# Patient Record
Sex: Female | Born: 1960 | Race: White | Hispanic: No | Marital: Married | Smoking: Never smoker
Health system: Southern US, Community
[De-identification: ages and names within clinical notes are randomized; demographics above are authoritative.]

## PROBLEM LIST (undated history)

## (undated) DIAGNOSIS — I493 Ventricular premature depolarization: Secondary | ICD-10-CM

## (undated) DIAGNOSIS — Z9289 Personal history of other medical treatment: Secondary | ICD-10-CM

## (undated) DIAGNOSIS — Z8489 Family history of other specified conditions: Secondary | ICD-10-CM

## (undated) DIAGNOSIS — IMO0001 Reserved for inherently not codable concepts without codable children: Secondary | ICD-10-CM

## (undated) DIAGNOSIS — J4540 Moderate persistent asthma, uncomplicated: Secondary | ICD-10-CM

## (undated) DIAGNOSIS — I272 Pulmonary hypertension, unspecified: Secondary | ICD-10-CM

## (undated) DIAGNOSIS — Z8719 Personal history of other diseases of the digestive system: Secondary | ICD-10-CM

## (undated) DIAGNOSIS — K449 Diaphragmatic hernia without obstruction or gangrene: Secondary | ICD-10-CM

## (undated) DIAGNOSIS — R1011 Right upper quadrant pain: Secondary | ICD-10-CM

## (undated) DIAGNOSIS — R232 Flushing: Secondary | ICD-10-CM

## (undated) DIAGNOSIS — K219 Gastro-esophageal reflux disease without esophagitis: Secondary | ICD-10-CM

## (undated) DIAGNOSIS — I471 Supraventricular tachycardia: Secondary | ICD-10-CM

## (undated) DIAGNOSIS — N6019 Diffuse cystic mastopathy of unspecified breast: Secondary | ICD-10-CM

## (undated) DIAGNOSIS — Z973 Presence of spectacles and contact lenses: Secondary | ICD-10-CM

## (undated) DIAGNOSIS — R002 Palpitations: Secondary | ICD-10-CM

## (undated) DIAGNOSIS — E785 Hyperlipidemia, unspecified: Secondary | ICD-10-CM

## (undated) DIAGNOSIS — N3090 Cystitis, unspecified without hematuria: Secondary | ICD-10-CM

## (undated) DIAGNOSIS — I5032 Chronic diastolic (congestive) heart failure: Secondary | ICD-10-CM

## (undated) DIAGNOSIS — I491 Atrial premature depolarization: Secondary | ICD-10-CM

## (undated) DIAGNOSIS — I4719 Other supraventricular tachycardia: Secondary | ICD-10-CM

## (undated) HISTORY — PX: BREAST SURGERY: SHX581

## (undated) HISTORY — DX: Diffuse cystic mastopathy of unspecified breast: N60.19

## (undated) HISTORY — DX: Diaphragmatic hernia without obstruction or gangrene: K44.9

## (undated) HISTORY — DX: Personal history of other medical treatment: Z92.89

## (undated) HISTORY — PX: UPPER GASTROINTESTINAL ENDOSCOPY: SHX188

## (undated) HISTORY — DX: Right upper quadrant pain: R10.11

## (undated) HISTORY — DX: Gastro-esophageal reflux disease without esophagitis: K21.9

## (undated) HISTORY — DX: Moderate persistent asthma, uncomplicated: J45.40

## (undated) HISTORY — DX: Palpitations: R00.2

## (undated) HISTORY — DX: Atrial premature depolarization: I49.1

## (undated) HISTORY — PX: DILATION AND CURETTAGE OF UTERUS: SHX78

## (undated) HISTORY — DX: Cystitis, unspecified without hematuria: N30.90

## (undated) HISTORY — DX: Flushing: R23.2

## (undated) HISTORY — DX: Chronic diastolic (congestive) heart failure: I50.32

## (undated) HISTORY — DX: Supraventricular tachycardia: I47.1

## (undated) HISTORY — DX: Reserved for inherently not codable concepts without codable children: IMO0001

## (undated) HISTORY — DX: Pulmonary hypertension, unspecified: I27.20

## (undated) HISTORY — DX: Ventricular premature depolarization: I49.3

## (undated) HISTORY — DX: Other supraventricular tachycardia: I47.19

## (undated) HISTORY — DX: Hyperlipidemia, unspecified: E78.5

---
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
## 1994-02-06 HISTORY — PX: ABDOMINAL HYSTERECTOMY: SHX81

## 1999-01-10 ENCOUNTER — Encounter: Payer: Self-pay | Admitting: Family Medicine

## 1999-01-10 ENCOUNTER — Encounter: Admission: RE | Admit: 1999-01-10 | Discharge: 1999-01-10 | Payer: Self-pay | Admitting: Family Medicine

## 1999-08-25 ENCOUNTER — Encounter: Admission: RE | Admit: 1999-08-25 | Discharge: 1999-08-25 | Payer: Self-pay | Admitting: Family Medicine

## 1999-08-25 ENCOUNTER — Encounter: Payer: Self-pay | Admitting: Family Medicine

## 2002-04-09 ENCOUNTER — Encounter: Payer: Self-pay | Admitting: Emergency Medicine

## 2002-04-09 ENCOUNTER — Emergency Department (HOSPITAL_COMMUNITY): Admission: EM | Admit: 2002-04-09 | Discharge: 2002-04-09 | Payer: Self-pay | Admitting: Emergency Medicine

## 2003-06-16 ENCOUNTER — Encounter: Admission: RE | Admit: 2003-06-16 | Discharge: 2003-06-16 | Payer: Self-pay | Admitting: Internal Medicine

## 2004-05-25 ENCOUNTER — Emergency Department (HOSPITAL_COMMUNITY): Admission: EM | Admit: 2004-05-25 | Discharge: 2004-05-25 | Payer: Self-pay | Admitting: *Deleted

## 2005-10-05 ENCOUNTER — Ambulatory Visit: Payer: Self-pay | Admitting: Family Medicine

## 2005-10-13 ENCOUNTER — Ambulatory Visit: Payer: Self-pay | Admitting: Family Medicine

## 2005-11-22 ENCOUNTER — Ambulatory Visit: Payer: Self-pay | Admitting: Family Medicine

## 2005-11-22 LAB — CONVERTED CEMR LAB
Glucose, Bld: 110 mg/dL — ABNORMAL HIGH (ref 70–99)
Hgb A1c MFr Bld: 5.4 % (ref 4.6–6.0)

## 2006-01-03 ENCOUNTER — Encounter: Admission: RE | Admit: 2006-01-03 | Discharge: 2006-04-03 | Payer: Self-pay | Admitting: Family Medicine

## 2006-07-12 DIAGNOSIS — J45901 Unspecified asthma with (acute) exacerbation: Secondary | ICD-10-CM | POA: Insufficient documentation

## 2006-07-12 DIAGNOSIS — K219 Gastro-esophageal reflux disease without esophagitis: Secondary | ICD-10-CM | POA: Insufficient documentation

## 2006-10-08 LAB — CONVERTED CEMR LAB: Pap Smear: NORMAL

## 2007-01-18 ENCOUNTER — Telehealth: Payer: Self-pay | Admitting: Internal Medicine

## 2007-03-07 ENCOUNTER — Ambulatory Visit: Payer: Self-pay | Admitting: Internal Medicine

## 2007-03-07 DIAGNOSIS — E119 Type 2 diabetes mellitus without complications: Secondary | ICD-10-CM

## 2007-03-07 DIAGNOSIS — N3090 Cystitis, unspecified without hematuria: Secondary | ICD-10-CM | POA: Insufficient documentation

## 2007-03-07 DIAGNOSIS — E785 Hyperlipidemia, unspecified: Secondary | ICD-10-CM | POA: Insufficient documentation

## 2007-03-07 DIAGNOSIS — I491 Atrial premature depolarization: Secondary | ICD-10-CM | POA: Insufficient documentation

## 2007-03-07 DIAGNOSIS — E118 Type 2 diabetes mellitus with unspecified complications: Secondary | ICD-10-CM | POA: Insufficient documentation

## 2007-03-07 DIAGNOSIS — E782 Mixed hyperlipidemia: Secondary | ICD-10-CM | POA: Insufficient documentation

## 2007-03-07 LAB — CONVERTED CEMR LAB
Bacteria, UA: NONE SEEN
Bilirubin Urine: NEGATIVE
Glucose, Urine, Semiquant: NEGATIVE
Ketones, urine, test strip: NEGATIVE
Nitrite: NEGATIVE
RBC / HPF: NONE SEEN (ref <3)
WBC, UA: NONE SEEN cells/hpf (ref <3)
pH: 6

## 2007-03-08 ENCOUNTER — Encounter: Payer: Self-pay | Admitting: Internal Medicine

## 2007-03-12 LAB — CONVERTED CEMR LAB
AST: 21 units/L (ref 0–37)
Bilirubin, Direct: 0.1 mg/dL (ref 0.0–0.3)
Chloride: 103 meq/L (ref 96–112)
Cholesterol: 138 mg/dL (ref 0–200)
Creatinine, Ser: 0.6 mg/dL (ref 0.4–1.2)
Eosinophils Absolute: 0.1 10*3/uL (ref 0.0–0.6)
Eosinophils Relative: 1.6 % (ref 0.0–5.0)
GFR calc non Af Amer: 113 mL/min
Glucose, Bld: 118 mg/dL — ABNORMAL HIGH (ref 70–99)
HCT: 40.6 % (ref 36.0–46.0)
Hemoglobin: 13.4 g/dL (ref 12.0–15.0)
Hgb A1c MFr Bld: 6.2 % — ABNORMAL HIGH (ref 4.6–6.0)
MCHC: 32.9 g/dL (ref 30.0–36.0)
MCV: 86.9 fL (ref 78.0–100.0)
Monocytes Absolute: 0.4 10*3/uL (ref 0.2–0.7)
Neutrophils Relative %: 69 % (ref 43.0–77.0)
Potassium: 4.8 meq/L (ref 3.5–5.1)
RBC: 4.67 M/uL (ref 3.87–5.11)
Sodium: 141 meq/L (ref 135–145)
TSH: 1.52 microintl units/mL (ref 0.35–5.50)
Total Bilirubin: 0.6 mg/dL (ref 0.3–1.2)
Total CHOL/HDL Ratio: 2.7
WBC: 6.4 10*3/uL (ref 4.5–10.5)

## 2007-04-03 ENCOUNTER — Encounter: Payer: Self-pay | Admitting: Internal Medicine

## 2007-08-30 ENCOUNTER — Ambulatory Visit: Payer: Self-pay | Admitting: Internal Medicine

## 2007-09-09 LAB — CONVERTED CEMR LAB: ALT: 19 units/L (ref 0–35)

## 2007-09-10 ENCOUNTER — Encounter (INDEPENDENT_AMBULATORY_CARE_PROVIDER_SITE_OTHER): Payer: Self-pay | Admitting: *Deleted

## 2007-09-18 ENCOUNTER — Telehealth: Payer: Self-pay | Admitting: Internal Medicine

## 2007-09-20 ENCOUNTER — Telehealth (INDEPENDENT_AMBULATORY_CARE_PROVIDER_SITE_OTHER): Payer: Self-pay | Admitting: *Deleted

## 2007-09-20 ENCOUNTER — Ambulatory Visit: Payer: Self-pay | Admitting: *Deleted

## 2007-11-11 ENCOUNTER — Encounter: Payer: Self-pay | Admitting: Internal Medicine

## 2008-02-26 ENCOUNTER — Ambulatory Visit: Payer: Self-pay | Admitting: Internal Medicine

## 2008-06-18 ENCOUNTER — Encounter: Payer: Self-pay | Admitting: Internal Medicine

## 2008-08-03 ENCOUNTER — Encounter (INDEPENDENT_AMBULATORY_CARE_PROVIDER_SITE_OTHER): Payer: Self-pay | Admitting: *Deleted

## 2008-09-05 ENCOUNTER — Encounter: Payer: Self-pay | Admitting: Internal Medicine

## 2008-09-05 ENCOUNTER — Emergency Department (HOSPITAL_COMMUNITY): Admission: EM | Admit: 2008-09-05 | Discharge: 2008-09-05 | Payer: Self-pay | Admitting: Emergency Medicine

## 2008-10-02 ENCOUNTER — Ambulatory Visit: Payer: Self-pay | Admitting: Internal Medicine

## 2008-10-07 ENCOUNTER — Ambulatory Visit: Payer: Self-pay | Admitting: Internal Medicine

## 2008-10-08 ENCOUNTER — Telehealth (INDEPENDENT_AMBULATORY_CARE_PROVIDER_SITE_OTHER): Payer: Self-pay | Admitting: *Deleted

## 2008-10-09 ENCOUNTER — Telehealth: Payer: Self-pay | Admitting: Internal Medicine

## 2008-10-15 LAB — CONVERTED CEMR LAB
ALT: 42 units/L — ABNORMAL HIGH (ref 0–35)
Albumin: 3.9 g/dL (ref 3.5–5.2)
Basophils Relative: 0.9 % (ref 0.0–3.0)
Chloride: 104 meq/L (ref 96–112)
Cholesterol: 178 mg/dL (ref 0–200)
Eosinophils Absolute: 0.1 10*3/uL (ref 0.0–0.7)
Eosinophils Relative: 1.9 % (ref 0.0–5.0)
GFR calc non Af Amer: 94.14 mL/min (ref >60)
Glucose, Bld: 194 mg/dL — ABNORMAL HIGH (ref 70–99)
HDL: 49.8 mg/dL (ref >39.00)
Lymphocytes Relative: 26.9 % (ref 12.0–46.0)
MCHC: 33.9 g/dL (ref 30.0–36.0)
Monocytes Relative: 7.3 % (ref 3.0–12.0)
Neutrophils Relative %: 63 % (ref 43.0–77.0)
Potassium: 3.8 meq/L (ref 3.5–5.1)
RBC: 4.64 M/uL (ref 3.87–5.11)
Sodium: 139 meq/L (ref 135–145)
Total Protein: 6.9 g/dL (ref 6.0–8.3)
VLDL: 15.6 mg/dL (ref 0.0–40.0)
WBC: 5.1 10*3/uL (ref 4.5–10.5)

## 2009-01-04 ENCOUNTER — Encounter (INDEPENDENT_AMBULATORY_CARE_PROVIDER_SITE_OTHER): Payer: Self-pay | Admitting: *Deleted

## 2009-01-04 ENCOUNTER — Ambulatory Visit: Payer: Self-pay | Admitting: Internal Medicine

## 2009-02-06 HISTORY — PX: CATARACT EXTRACTION: SUR2

## 2009-02-26 ENCOUNTER — Encounter: Payer: Self-pay | Admitting: Internal Medicine

## 2009-03-17 ENCOUNTER — Ambulatory Visit: Payer: Self-pay | Admitting: Internal Medicine

## 2009-03-21 LAB — CONVERTED CEMR LAB
ALT: 25 units/L (ref 0–35)
AST: 23 units/L (ref 0–37)
HDL: 44.1 mg/dL (ref >39.00)

## 2009-05-24 ENCOUNTER — Encounter: Payer: Self-pay | Admitting: Internal Medicine

## 2009-07-02 ENCOUNTER — Ambulatory Visit: Payer: Self-pay | Admitting: Internal Medicine

## 2009-07-02 LAB — CONVERTED CEMR LAB
Bilirubin Urine: NEGATIVE
Casts: NONE SEEN /lpf
Crystals: NONE SEEN
Glucose, Urine, Semiquant: NEGATIVE
Nitrite: NEGATIVE
Protein, U semiquant: NEGATIVE
Specific Gravity, Urine: 1.012 (ref 1.005–1.030)
Squamous Epithelial / LPF: NONE SEEN /lpf
pH: 5.5 (ref 5.0–8.0)

## 2009-07-03 ENCOUNTER — Encounter: Payer: Self-pay | Admitting: Internal Medicine

## 2009-07-23 ENCOUNTER — Ambulatory Visit: Payer: Self-pay | Admitting: Internal Medicine

## 2009-08-26 ENCOUNTER — Encounter: Payer: Self-pay | Admitting: Internal Medicine

## 2009-08-26 LAB — CONVERTED CEMR LAB
Calcium: 9.4 mg/dL
HDL: 61 mg/dL
Hgb A1c MFr Bld: 5.8 %
Triglycerides: 69 mg/dL

## 2009-09-15 ENCOUNTER — Telehealth (INDEPENDENT_AMBULATORY_CARE_PROVIDER_SITE_OTHER): Payer: Self-pay | Admitting: *Deleted

## 2009-09-15 ENCOUNTER — Encounter: Payer: Self-pay | Admitting: Internal Medicine

## 2009-11-12 ENCOUNTER — Telehealth: Payer: Self-pay | Admitting: Internal Medicine

## 2009-12-14 ENCOUNTER — Ambulatory Visit: Payer: Self-pay | Admitting: Internal Medicine

## 2010-02-27 ENCOUNTER — Encounter: Payer: Self-pay | Admitting: Internal Medicine

## 2010-03-08 NOTE — Progress Notes (Signed)
Summary: [facility] lost rx  Phone Note From Other Clinic   Summary of Call: [facility] called stating that they lost patient prescriptions for Crestor and Metformin. RX's were given to them verbally. Initial call taken by: Lucious Groves CMA,  November 12, 2009 10:56 AM  Follow-up for Phone Call        okay to re-do  prescriptions Follow-up by: [facility] E. Paz MD,  November 12, 2009 11:47 AM    Prescriptions: CRESTOR 10 MG TABS (ROSUVASTATIN CALCIUM) as directed  #90 x 3   Entered by:   Lucious Groves CMA   Authorized by:   Nolon Rod. Paz MD   Signed by:   Lucious Groves CMA on 11/12/2009   Method used:   Telephoned to ...       [facility] [address] [id number]* (retail)       [address]       [place], [place]  [address]       Ph: [phone]       Fax: [phone]   RxID:   [id number] METFORMIN HCL 500 MG  TABS (METFORMIN HCL) two times a day  #180 x 3   Entered by:   Lucious Groves CMA   Authorized by:   Nolon Rod. Paz MD   Signed by:   Lucious Groves CMA on 11/12/2009   Method used:   Telephoned to ...       [facility] [id number]* (retail)       [address]       [place], [place]  [address]       Ph: [phone]       Fax: [phone]   RxID:   [id number]

## 2010-03-08 NOTE — Assessment & Plan Note (Signed)
Summary: 4 mo. f/u - jr- pt will arrive at 2:00   Vital Signs:  Patient profile:   50 year old female Height:      64.5 inches Weight:      189 pounds Temp:     98.5 degrees F oral Pulse rate:   60 / minute BP sitting:   100 / 72  (left arm)  Vitals Entered By: Jeremy Johann CMA (July 23, 2009 2:24 PM) CC: 4 month f/u Comments --not fasting --DM __refills REVIEWED MED LIST, PATIENT AGREED DOSE AND INSTRUCTION CORRECT    History of Present Illness: routine office visit Was recently seen with a UTI, completely asymptomatic at this point  Would like to continue using Macrobid postcoital for prevention of UTIs. Needs refill on her medications  Allergies: 1)  ! Pcn 2)  ! Stadol (Butorphanol Tartrate)  Past History:  Past Medical History: Reviewed history from 07/02/2009 and no changes required. Asthma-- sees Dr Beaulah Dinning  GERD Fibrocystic Breast disease Recurrent cystitis Hyperlipidemia  DM--A1c 6.2 (1/ 2009) PACs sees Dr Deborah Chalk recurring UTIs, took  postcoital antibiotic for years, symptoms better after her hysterectomy 1996, symptoms re-surface in 2011  Past Surgical History: Reviewed history from 07/02/2009 and no changes required. Hysterectomy due to endometriosis and fibroids approximately 1996 NO oophorectomy Cataract extraction (06/07/2009) left Cataract extraction 06/28/09  right  Social History: Reviewed history from 03/17/2009 and no changes required. 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  Physical Exam  General:  alert and well-developed.   Lungs:  normal respiratory effort, no intercostal retractions, no accessory muscle use, and normal breath sounds.     Heart:  normal rate, regular rhythm, and no murmur.   Extremities:  no pretibial edema bilaterally  Psych:  Oriented X3, memory intact for recent and remote, normally interactive, good eye contact, not anxious appearing, and not depressed appearing.     Impression & Recommendations:  Problem # 1:  UTI (ICD-599.0) asymptomatic Macrobid refill provided, she will use one Macrobid postcoital for UTI prevention Her updated medication list for this problem includes:    Macrobid 100 Mg Caps (Nitrofurantoin monohyd macro) .Marland Kitchen... As directed  Problem # 2:  DIABETES MELLITUS, TYPE II, BORDERLINE (ICD-790.29) needs refill and labs, see instructions Her updated medication list for this problem includes:    Metformin Hcl 500 Mg Tabs (Metformin hcl) .Marland Kitchen..Marland Kitchen Two times a day  Labs Reviewed: Creat: 0.7 (10/07/2008)     Problem # 3:  HYPERLIPIDEMIA (ICD-272.4) on a lower dose of Crestor, taking only 5 mg daily. Due for labs, see instructions Her updated medication list for this problem includes:    Crestor 10 Mg Tabs (Rosuvastatin calcium) .Marland Kitchen... As directed  Labs Reviewed: SGOT: 23 (03/17/2009)   SGPT: 25 (03/17/2009)   HDL:44.10 (03/17/2009), 49.80 (10/07/2008)  LDL:56 (03/17/2009), 113 (16/11/9602)  Chol:114 (03/17/2009), 178 (10/07/2008)  Trig:71.0 (03/17/2009), 78.0 (10/07/2008)  Complete Medication List: 1)  Metformin Hcl 500 Mg Tabs (Metformin hcl) .... Two times a day 2)  Flonase 50 Mcg/act Susp (Fluticasone propionate) 3)  Albuterol 90 Mcg/act Aers (Albuterol) .... Inhale two puffs four times a day as needed 4)  Qvar 80 Mcg/act  Aers (Beclomethasone dipropionate) .... As directed 5)  Atenolol 50 Mg Tabs (Atenolol) .Marland Kitchen.. 1 by mouth qd 6)  Crestor 10 Mg Tabs (Rosuvastatin calcium) .... As directed 7)  Omeprazole 20 Mg Tbec (Omeprazole) .Marland Kitchen.. 1 by mouth qd 8)  Pred Forte 1 % Susp (Prednisolone acetate) 9)  Bromday 0.09 % Soln (Bromfenac sodium) 10)  Singulair 10 Mg Tabs (Montelukast sodium)  .... Daily 11)  Macrobid 100 Mg Caps (Nitrofurantoin monohyd macro) .... As directed  Patient Instructions: 1)  Karen Barnes, please get the following fasting labs and send them to me: 2)  FLP ----------dx high chol 3)  A1C , microalbumin, BMP-------------  Dx DM 4)  Please schedule a follow-up appointment in 4 to 5  months .  Prescriptions: MACROBID 100 MG CAPS (NITROFURANTOIN MONOHYD MACRO) as directed  #90 x 1   Entered and Authorized by:   Yanis Juma E. Abner Ardis MD   Signed by:   Nolon Rod. Salik Grewell MD on 07/23/2009   Method used:   Print then Give to Patient   RxID:   [id number] CRESTOR 10 MG TABS (ROSUVASTATIN CALCIUM) as directed  #90 x 3   Entered and Authorized by:   Nolon Rod. Jenavee Laguardia MD   Signed by:   Nolon Rod. Renessa Wellnitz MD on 07/23/2009   Method used:   Print then Give to Patient   RxID:   [id number] METFORMIN HCL 500 MG  TABS (METFORMIN HCL) two times a day  #180 x 3   Entered and Authorized by:   Nolon Rod. Maryan Sivak MD   Signed by:   Nolon Rod. Hidaya Daniel MD on 07/23/2009   Method used:   Print then Give to Patient   RxID:   [id number]

## 2010-03-08 NOTE — Assessment & Plan Note (Signed)
Summary: congested/cough/cbs   Vital Signs:  Patient profile:   50 year old female Height:      64.75 inches Weight:      188.38 pounds BMI:     31.70 O2 Sat:      95 % on Room air Temp:     98.0 degrees F oral Pulse rate:   86 / minute Pulse rhythm:   regular BP sitting:   126 / 80  (left arm) Cuff size:   regular  Vitals Entered By: Army Fossa CMA (December 14, 2009 11:07 AM)  O2 Flow:  Room air CC: Pt here c/o head cold that has moved into her chest. Comments Started on Friday [facility] [address]    History of Present Illness: started with head congestion 4 days  ago, postnasal dripping, aches and pains, felt weak and stayed in bed. Over all feels a little today but has now chest congestion and yellow sputum. She got a flu shot a week ago. Her daughter has a URI  Current Medications (verified): 1)  Metformin Hcl 500 Mg  Tabs (Metformin Hcl) .... Two Times A Day 2)  Flonase 50 Mcg/act  Susp (Fluticasone Propionate) 3)  Albuterol 90 Mcg/act  Aers (Albuterol) .... Inhale Two Puffs Four Times A Day As Needed 4)  Qvar 80 Mcg/act  Aers (Beclomethasone Dipropionate) .... As Directed 5)  Atenolol 50 Mg  Tabs (Atenolol) .Marland Kitchen.. 1 By Mouth Qd 6)  Crestor 10 Mg Tabs (Rosuvastatin Calcium) .... As Directed 7)  Omeprazole 20 Mg  Tbec (Omeprazole) .Marland Kitchen.. 1 By Mouth Qd 8)  Singulair 10 Mg Tabs (Montelukast Sodium) .... Daily 9)  Macrobid 100 Mg Caps (Nitrofurantoin Monohyd Macro) .... As Directed  Allergies (verified): 1)  ! Pcn 2)  ! Stadol  Past History:  Past Medical History: Reviewed history from 07/02/2009 and no changes required. Asthma-- sees Dr Beaulah Dinning  GERD Fibrocystic Breast disease Recurrent cystitis Hyperlipidemia  DM--A1c 6.2 (1/ 2009) PACs sees Dr Deborah Chalk recurring UTIs, took  postcoital antibiotic for years, symptoms better after her hysterectomy 1996, symptoms re-surface in 2011  Past Surgical History: Reviewed history from 07/02/2009 and no changes  required. Hysterectomy due to endometriosis and fibroids approximately 1996 NO oophorectomy Cataract extraction (06/07/2009) left Cataract extraction 06/28/09  right  Social History: Reviewed history from 03/17/2009 and no changes required. 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 Resp:  Denies shortness of breath; no wheezing at present but did use a inhaler 2 days ago. GI:  Denies nausea and vomiting. MS:  some muscle aches 2 days ago . Derm:  Denies rash.  Physical Exam  General:  alert and well-developed.  nontoxic Head:  face symmetric,  Sinuses not tender Ears:  R ear normal and L ear normal.   Nose:  slightly congested Mouth:  no redness or discharge Lungs:  normal respiratory effort, no intercostal retractions, no accessory muscle use, and normal breath sounds.   Heart:  normal rate, regular rhythm, and no murmur.     Impression & Recommendations:  Problem # 1:  VIRAL INFECTION (ICD-079.99) unclear if she has a viral syndrome or this  still a reaction to the flu shot. See instructions  Complete Medication List: 1)  Metformin Hcl 500 Mg Tabs (Metformin hcl) .... Two times a day 2)  Flonase 50 Mcg/act Susp (Fluticasone propionate) 3)  Albuterol 90 Mcg/act Aers (Albuterol) .Marland KitchenMarland KitchenMarland Kitchen  Inhale two puffs four times a day as needed 4)  Qvar 80 Mcg/act Aers (Beclomethasone dipropionate) .... As directed 5)  Atenolol 50 Mg Tabs (Atenolol) .Marland Kitchen.. 1 by mouth qd 6)  Crestor 10 Mg Tabs (Rosuvastatin calcium) .... As directed 7)  Omeprazole 20 Mg Tbec (Omeprazole) .Marland Kitchen.. 1 by mouth qd 8)  Singulair 10 Mg Tabs (Montelukast sodium) .... Daily 9)  Macrobid 100 Mg Caps (Nitrofurantoin monohyd macro) .... As directed 10)  Zithromax Z-pak 250 Mg Tabs (Azithromycin) .... As directed  Patient Instructions: 1)  rest, fluids , tylenol 2)  continue with OTCs for  cough 3)  start a zpack if severe symptoms , increase cough and mucus or if no better in few days Prescriptions: ZITHROMAX Z-PAK 250 MG TABS (AZITHROMYCIN) as directed  #1 x 0   Entered and Authorized by:   Nolon Rod. Marielouise Amey MD   Signed by:   Nolon Rod. Andreia Gandolfi MD on 12/14/2009   Method used:   Print then Give to Patient   RxID:   [id number]    Orders Added: 1)  Est. Patient Level III [46962]   Immunization History:  Influenza Immunization History:    Influenza:  historical (12/08/2009)   Immunization History:  Influenza Immunization History:    Influenza:  Historical (12/08/2009)

## 2010-03-08 NOTE — Letter (Signed)
Summary: [facility]  [facility]   Imported By: Lanelle Bal 03/11/2009 13:10:58  _____________________________________________________________________  External Attachment:    Type:   Image     Comment:   External Document

## 2010-03-08 NOTE — Assessment & Plan Note (Signed)
Summary: 3 mth fu/kdc   Vital Signs:  Patient profile:   50 year old female Height:      64.5 inches Weight:      190.8 pounds BMI:     32.36 Pulse rate:   64 / minute BP sitting:   124 / 86  Vitals Entered By: Shary Decamp (March 17, 2009 10:44 AM) CC: rov - fasting (PT HAS LOST 20#!!!), Back Pain Is Patient Diabetic? Yes Comments  - fasting CBG - 124  - avg CBG @ home after breakfast 120-140 Shary Decamp  March 17, 2009 10:49 AM    History of Present Illness: diabetes diet better , lost  several pounds in 4 to 5 months diet , exercise better ! fasting CBG - 124 avg CBG @ home after breakfast 120-140  high chol on higher dose of crestor, no s/e   seen w/ syncope few months ago did see her cards, everything ok no further episodes     Preventive Screening-Counseling & Management  Caffeine-Diet-Exercise     Does Patient Exercise: yes     Times/week: 6  Allergies: 1)  ! 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 CV:  Denies chest pain or discomfort and swelling of feet. GI:  Denies diarrhea, nausea, and vomiting.  Physical Exam  General:  alert and well-developed.   Lungs:  normal respiratory effort, no intercostal retractions, no accessory muscle use, and normal breath sounds.   Heart:  normal rate, regular rhythm, no murmur, and no gallop.   Extremities:  no pretibial edema bilaterally    Impression & Recommendations:  Problem # 1:  DIABETES MELLITUS, TYPE II, BORDERLINE (ICD-790.29) doing great,praised , labs  printed material provided  regards CBG goals   Her updated medication list for this problem includes:    Metformin Hcl 500 Mg Tabs (Metformin hcl) .Marland Kitchen... 2 by mouth bid  Orders: Venipuncture (23762) TLB-A1C / Hgb A1C (Glycohemoglobin) (83036-A1C)  Problem # 2:  HYPERLIPIDEMIA (ICD-272.4) tolerating current dose of Crestor Her updated medication list for this problem includes:    Crestor 10 Mg Tabs (Rosuvastatin calcium) .Marland Kitchen... 1 by mouth at bedtime    Labs Reviewed: SGOT: 30 (10/07/2008)   SGPT: 42 (10/07/2008)   HDL:49.80 (10/07/2008), 51.4 (03/07/2007)  LDL:113 (10/07/2008), 74 (83/15/1761)  Chol:178 (10/07/2008), 138 (03/07/2007)  Trig:78.0 (10/07/2008), 63 (03/07/2007)  Orders: TLB-ALT (SGPT) (84460-ALT) TLB-AST (SGOT) (84450-SGOT) TLB-Lipid Panel (80061-LIPID)  Problem # 3:  ? of SYNCOPE (ICD-780.2) no further episodes, status post evaluation by cardiology  Complete Medication List: 1)  Metformin Hcl 500 Mg Tabs (Metformin hcl) .... 2 by mouth bid 2)  Flonase 50 Mcg/act Susp (Fluticasone propionate) 3)  Albuterol 90 Mcg/act Aers (Albuterol) .... Inhale two puffs four times a day as needed 4)  Qvar 80 Mcg/act Aers (Beclomethasone dipropionate) .... As directed 5)  Atenolol 50 Mg Tabs (Atenolol) .Marland Kitchen.. 1 by mouth qd 6)  Crestor 10 Mg Tabs (Rosuvastatin calcium) .Marland Kitchen.. 1 by mouth at bedtime 7)  Omeprazole 20 Mg Tbec (Omeprazole) .Marland Kitchen.. 1 by mouth qd  Patient Instructions: 1)  Please schedule a follow-up appointment in 4 months .

## 2010-03-08 NOTE — Progress Notes (Signed)
Summary: lab results   Phone Note Outgoing Call   Summary of Call: advise patient  I reviewed her labs her borderline diabetes and cholesterol are very well controlled good results! Jose E. Paz MD  September 15, 2009 8:29 AM   Follow-up for Phone Call        Left message for pt to call back. Army Fossa CMA  September 15, 2009 10:52 AM   Additional Follow-up for Phone Call Additional follow up Details #1::        I spoke with pt she is aware. Army Fossa CMA  September 16, 2009 12:54 PM

## 2010-03-08 NOTE — Assessment & Plan Note (Signed)
Summary: bladder infection   Vital Signs:  Patient profile:   50 year old female Height:      64.5 inches Weight:      188.2 pounds Temp:     99.1 degrees F BP sitting:   110 / 60  Vitals Entered By: Shary Decamp (Jul 02, 2009 11:25 AM) CC: uti sxs x several days   History of Present Illness: woke up this morning with dark, orange-bloody urine along with dysuria and urgency Similar but less noticeable symptoms a month ago, at that time symptoms went away after drinking cranberry juice. In both cases, symptoms were postcoital. For years she took postcoital antibiotics to prevent UTIs and wonders if she needs to start doing that again  Current Medications (verified): 1)  Metformin Hcl 500 Mg  Tabs (Metformin Hcl) .... Two Times A Day 2)  Flonase 50 Mcg/act  Susp (Fluticasone Propionate) 3)  Albuterol 90 Mcg/act  Aers (Albuterol) .... Inhale Two Puffs Four Times A Day As Needed 4)  Qvar 80 Mcg/act  Aers (Beclomethasone Dipropionate) .... As Directed 5)  Atenolol 50 Mg  Tabs (Atenolol) .Marland Kitchen.. 1 By Mouth Qd 6)  Crestor 10 Mg Tabs (Rosuvastatin Calcium) .Marland Kitchen.. 1 By Mouth At Bedtime 7)  Omeprazole 20 Mg  Tbec (Omeprazole) .Marland Kitchen.. 1 By Mouth Qd 8)  Pred Forte 1 % Susp (Prednisolone Acetate) 9)  Bromday 0.09 % Soln (Bromfenac Sodium) 10)  Besivance 0.6 % Susp (Besifloxacin Hcl) 11)  Singulair 10 Mg Tabs (Montelukast Sodium) .... Daily  Allergies (verified): 1)  ! Pcn 2)  ! Stadol (Butorphanol Tartrate)  Past History:  Past Medical History: Asthma-- sees Dr Beaulah Dinning  GERD Fibrocystic Breast disease Recurrent cystitis Hyperlipidemia  DM--A1c 6.2 (1/ 2009) PACs sees Dr Deborah Chalk recurring UTIs, took  postcoital antibiotic for years, symptoms better after her hysterectomy 1996, symptoms re-surface in 2011  Past Surgical History: Hysterectomy due to endometriosis and fibroids approximately 1996 NO oophorectomy Cataract extraction (06/07/2009) left Cataract extraction 06/28/09   right  Social History: Reviewed history from 03/17/2009 and no changes required. 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  nontoxic Abdomen:  soft, no distention, no masses, no guarding, and no rigidity.  mild tenderness at the suprapubic area. No CVA  tenderness   Impression & Recommendations:  Problem # 1:  UTI (ICD-599.0) acute urinary infection, will treat in the standard fashion with Macrobid for 3 days. She also has a history of postcoital UTIs, advise patient to take Macrobid postcoital and see how she does, see instructions Her updated medication list for this problem includes:    Macrobid 100 Mg Caps (Nitrofurantoin monohyd macro) .Marland Kitchen... 1 by mouth two times a day  Orders: Specimen Handling (44034) T-Urine Microscopic (74259-56387) T-Culture, Urine (56433-29518) UA Dipstick w/Micro (automated) (81001)  Complete Medication List: 1)  Metformin Hcl 500 Mg Tabs (Metformin hcl) .... Two times a day 2)  Flonase 50 Mcg/act Susp (Fluticasone propionate) 3)  Albuterol 90 Mcg/act Aers (Albuterol) .... Inhale two puffs four times a day as needed 4)  Qvar 80 Mcg/act Aers (Beclomethasone dipropionate) .... As directed 5)  Atenolol 50 Mg Tabs (Atenolol) .Marland Kitchen.. 1 by mouth qd 6)  Crestor 10 Mg Tabs (Rosuvastatin calcium) .Marland Kitchen.. 1 by mouth at bedtime 7)  Omeprazole 20 Mg Tbec (Omeprazole) .Marland Kitchen.. 1 by mouth qd  8)  Pred Forte 1 % Susp (Prednisolone acetate) 9)  Bromday 0.09 % Soln (Bromfenac sodium) 10)  Besivance 0.6 % Susp (Besifloxacin hcl) 11)  Singulair 10 Mg Tabs (Montelukast sodium) .... Daily 12)  Macrobid 100 Mg Caps (Nitrofurantoin monohyd macro) .Marland Kitchen.. 1 by mouth two times a day  Patient Instructions: 1)  macrobid twice a day x 3 days 2)  keep the leftover antibiotic and take one tablet post-coital 3)  if your  symptoms came back, please let me know  Prescriptions: MACROBID 100 MG CAPS (NITROFURANTOIN MONOHYD MACRO) 1 by mouth two times a day  #14 x 0   Entered and Authorized by:   Elita Quick E. Eila Runyan MD   Signed by:   Nolon Rod. Shenita Trego MD on 07/02/2009   Method used:   Print then Give to Patient   RxID:   [id number]   Laboratory Results   Urine Tests    Routine Urinalysis   Color: amber Appearance: Cloudy Glucose: negative   (Normal Range: Negative) Bilirubin: negative   (Normal Range: Negative) Ketone: negative   (Normal Range: Negative) Spec. Gravity: 1.010   (Normal Range: 1.003-1.035) Blood: large   (Normal Range: Negative) pH: 5.0   (Normal Range: 5.0-8.0) Protein: negative   (Normal Range: Negative) Urobilinogen: 0.2   (Normal Range: 0-1) Nitrite: negative   (Normal Range: Negative) Leukocyte Esterace: large   (Normal Range: Negative)

## 2010-03-08 NOTE — Letter (Signed)
Summary: Care Consideration Regarding Screening for Microalbuminuria/[employer] [facility]  Care Consideration Regarding Screening for Microalbuminuria/[facility]   Imported By: Lanelle Bal 06/15/2009 13:01:53  _____________________________________________________________________  External Attachment:    Type:   Image     Comment:   External Document

## 2010-03-08 NOTE — Miscellaneous (Signed)
Summary: LABS FROM 08/26/09  Clinical Lists Changes  Observations: Added new observation of TRIGLYC TOT: 69 mg/dL (21/30/8657 84:69) Added new observation of HDL: 61 mg/dL (62/95/2841 32:44) Added new observation of CHOLESTEROL: 150 mg/dL (02/08/7251 66:44) Added new observation of CALCIUM: 9.4 mg/dL (03/47/4259 56:38) Added new observation of HGBA1C: 5.8 % (08/26/2009 11:47) Added new observation of GLUCOSE SER: 118 mg/dL (75/64/3329 51:88) Added new observation of CREATININE: 0.76 mg/dL (41/66/0630 16:01) Added new observation of POTASSIUM: 4.2 mmol/L (08/26/2009 11:47)

## 2010-03-14 ENCOUNTER — Encounter: Payer: Self-pay | Admitting: Internal Medicine

## 2010-03-14 ENCOUNTER — Ambulatory Visit (INDEPENDENT_AMBULATORY_CARE_PROVIDER_SITE_OTHER): Payer: BC Managed Care – PPO | Admitting: Internal Medicine

## 2010-03-14 ENCOUNTER — Other Ambulatory Visit: Payer: Self-pay | Admitting: Internal Medicine

## 2010-03-14 ENCOUNTER — Ambulatory Visit (INDEPENDENT_AMBULATORY_CARE_PROVIDER_SITE_OTHER)
Admission: RE | Admit: 2010-03-14 | Discharge: 2010-03-14 | Disposition: A | Discharge status: Home / Self Care | Payer: BC Managed Care – PPO | Source: Ambulatory Visit | Attending: Internal Medicine | Admitting: Internal Medicine

## 2010-03-14 DIAGNOSIS — K209 Esophagitis, unspecified without bleeding: Secondary | ICD-10-CM

## 2010-03-14 DIAGNOSIS — R079 Chest pain, unspecified: Secondary | ICD-10-CM

## 2010-03-24 NOTE — Assessment & Plan Note (Signed)
Summary: pain behind breast bone after eating/cbs  Nurse Visit   Vital Signs:  Patient profile:   50 year old female Weight:      194.25 pounds Pulse rate:   80 / minute Pulse rhythm:   regular BP sitting:   128 / 84  (left arm) Cuff size:   regular  Vitals Entered By: Army Fossa CMA (March 14, 2010 2:31 PM)  History of Present Illness: 4 weeks history of consistent pain in the anterior lower chest after eating. The pain is not associated with shortness of breath or palpitations It is associated with some burping and belching. The pain is worse if she eats solids >> liquids last for an hour or so and is described  as sharp and/or burning It is not triggered by exertion.   ROS no fever , wt loss, edema is at baseline (mild) she has a history of GERD, previously, GERD manifested as classic heartburn associated with chest pain. Ambulatory BPs has been completely normal Her bowel movements are somewhat different, she used to have one or 2 a day, now has 3 or 4 daily Denies blood in the stools   Physical Exam  General:  alert and well-developed.   Chest Wall:  nontender to palpation Lungs:  normal respiratory effort, no intercostal retractions, no accessory muscle use, and normal breath sounds.   Heart:  normal rate, regular rhythm, and no murmur.   Abdomen:  soft, non-tender, no distention, no masses, no guarding, and no rigidity.      Impression & Recommendations:  Problem # 1:  CHEST PAIN (ICD-786.50) Assessment New patient presents with postprandial chest pain, she is diabetic EKG today   normal sinus rhythm, no acute changes,  similar to previous EKGs He reports at least 2 previous negative stress test. Plan: Chest x-ray Change PPIs to  Nexium Refer to GI, esophagitis? Esophageal stricture? endoscopy?-------------------likes to see Dr Ewing Schlein consider an abdominal ultrasound as well  Orders: EKG w/ Interpretation (93000) T-2 View CXR  (71020TC) Gastroenterology Referral (GI)  Problem # 2:  ? of ESOPHAGITIS, ACUTE (ICD-530.12) Assessment: New  see #1  Orders: Gastroenterology Referral (GI)  Complete Medication List: 1)  Metformin Hcl 500 Mg Tabs (Metformin hcl) .... Two times a day 2)  Flonase 50 Mcg/act Susp (Fluticasone propionate) 3)  Albuterol 90 Mcg/act Aers (Albuterol) .... Inhale two puffs four times a day as needed 4)  Qvar 80 Mcg/act Aers (Beclomethasone dipropionate) .... As directed 5)  Atenolol 50 Mg Tabs (Atenolol) .Marland Kitchen.. 1 by mouth qd 6)  Crestor 10 Mg Tabs (Rosuvastatin calcium) .... As directed 7)  Nexium 40 Mg Cpdr (Esomeprazole magnesium) .... One by mouth every morning before breakfast 8)  Singulair 10 Mg Tabs (Montelukast sodium) .... Daily 9)  Macrobid 100 Mg Caps (Nitrofurantoin monohyd macro) .... As directed   Patient Instructions: 1)  call if the pain changes or gets more intense  CC: Pt here c/o pain behind breast bone after eating  Comments x 3 1/2 weeks bowel movements have changed belching a lot [facility] [address]    Past History:  Past Medical History: Reviewed history from 07/02/2009 and no changes required. Asthma-- sees Dr Beaulah Dinning  GERD Fibrocystic Breast disease Recurrent cystitis Hyperlipidemia  DM--A1c 6.2 (1/ 2009) PACs sees Dr Deborah Chalk recurring UTIs, took  postcoital antibiotic for years, symptoms better after her hysterectomy 1996, symptoms re-surface in 2011  Past Surgical History: Reviewed history from 07/02/2009 and no changes required. Hysterectomy due to endometriosis and fibroids  approximately 1996 NO oophorectomy Cataract extraction (06/07/2009) left Cataract extraction 06/28/09  right   Current Medications (verified): 1)  Metformin Hcl 500 Mg  Tabs (Metformin Hcl) .... Two Times A Day 2)  Flonase 50 Mcg/act  Susp (Fluticasone Propionate) 3)  Albuterol 90 Mcg/act  Aers (Albuterol) .... Inhale Two Puffs Four Times A Day As Needed 4)  Qvar 80  Mcg/act  Aers (Beclomethasone Dipropionate) .... As Directed 5)  Atenolol 50 Mg  Tabs (Atenolol) .Marland Kitchen.. 1 By Mouth Qd 6)  Crestor 10 Mg Tabs (Rosuvastatin Calcium) .... As Directed 7)  Omeprazole 20 Mg  Tbec (Omeprazole) .Marland Kitchen.. 1 By Mouth Qd 8)  Singulair 10 Mg Tabs (Montelukast Sodium) .... Daily 9)  Macrobid 100 Mg Caps (Nitrofurantoin Monohyd Macro) .... As Directed  Allergies (verified): 1)  ! Pcn 2)  ! Stadol  Orders Added: 1)  EKG w/ Interpretation [93000] 2)  T-2 View CXR [71020TC] 3)  Gastroenterology Referral [GI] 4)  Est. Patient Level IV [16109] Prescriptions: NEXIUM 40 MG CPDR (ESOMEPRAZOLE MAGNESIUM) one by mouth every morning before breakfast  #30 x 6   Entered and Authorized by:   Nolon Rod. Pammie Chirino MD   Signed by:   Nolon Rod. Jonpaul Lumm MD on 03/14/2010   Method used:   Print then Give to Patient   RxID:   [id number]

## 2010-04-07 ENCOUNTER — Encounter: Payer: Self-pay | Admitting: Internal Medicine

## 2010-04-13 ENCOUNTER — Encounter: Payer: Self-pay | Admitting: Internal Medicine

## 2010-04-13 ENCOUNTER — Ambulatory Visit (INDEPENDENT_AMBULATORY_CARE_PROVIDER_SITE_OTHER): Payer: BC Managed Care – PPO | Admitting: Internal Medicine

## 2010-04-13 DIAGNOSIS — N209 Urinary calculus, unspecified: Secondary | ICD-10-CM

## 2010-04-13 LAB — CONVERTED CEMR LAB
Nitrite: NEGATIVE
Specific Gravity, Urine: 1.015
WBC Urine, dipstick: NEGATIVE

## 2010-04-14 ENCOUNTER — Encounter: Payer: Self-pay | Admitting: Internal Medicine

## 2010-04-14 LAB — CONVERTED CEMR LAB
Bacteria, UA: NONE SEEN
Casts: NONE SEEN /lpf

## 2010-04-15 ENCOUNTER — Other Ambulatory Visit: Payer: Self-pay | Admitting: Internal Medicine

## 2010-04-15 DIAGNOSIS — N209 Urinary calculus, unspecified: Secondary | ICD-10-CM

## 2010-04-19 NOTE — Consult Note (Signed)
Summary: [facility]  [facility]   Imported By: Maryln Gottron 04/12/2010 14:19:05  _____________________________________________________________________  External Attachment:    Type:   Image     Comment:   External Document

## 2010-04-19 NOTE — Assessment & Plan Note (Signed)
Summary: thought she had kidney stone over weekend, now having bladder...   Vital Signs:  Patient profile:   50 year old female Weight:      196.13 pounds Temp:     98.0 degrees F oral Pulse rate:   68 / minute Pulse rhythm:   regular BP sitting:   136 / 78  (left arm) Cuff size:   regular  Vitals Entered By: Army Fossa CMA (April 13, 2010 9:49 AM) CC: Bladder infection?  Comments woke up sunday night w/ periodic pain in (R) side, started macrobid monday- now having pain in bladder. Tuesday am pain continued in bladder taking macorbid still [facility] [address]    History of Present Illness:  3 days ago developed right flank pain that radiated to the suprapubic area,  on and off, episodes lasted about 30 minutes, pain was intense and sharp.  later on, the pain concentrated on the bladder, once again on and off, sharp, like a knife. last  sharp pain episode was  yesterday  at the present time she still has some soreness at the suprapubic area and very mild in the flank.  2 days ago she self started Macrobid which she has for PRN use.  ROS saw GI, to have a EGD-Cscope 05-2010  subjective fever yesterday, occasional chills. Mild nausea, no vomiting or diarrhea Appetite is not decreased,  bowel movements are normal, no constipation No gross hematuria, no classic dysuria that is usually associated with her UTIs.  no rash in the back  Current Medications (verified): 1)  Metformin Hcl 500 Mg  Tabs (Metformin Hcl) .... Two Times A Day 2)  Flonase 50 Mcg/act  Susp (Fluticasone Propionate) 3)  Albuterol 90 Mcg/act  Aers (Albuterol) .... Inhale Two Puffs Four Times A Day As Needed 4)  Qvar 80 Mcg/act  Aers (Beclomethasone Dipropionate) .... As Directed 5)  Atenolol 50 Mg  Tabs (Atenolol) .Marland Kitchen.. 1 By Mouth Qd 6)  Crestor 10 Mg Tabs (Rosuvastatin Calcium) .... As Directed 7)  Nexium 40 Mg Cpdr (Esomeprazole Magnesium) .... One By Mouth Every Morning Before Breakfast 8)  Singulair 10  Mg Tabs (Montelukast Sodium) .... Daily 9)  Macrobid 100 Mg Caps (Nitrofurantoin Monohyd Macro) .... As Directed  Allergies (verified): 1)  ! Pcn 2)  ! Stadol  Past History:  Past Medical History: Reviewed history from 07/02/2009 and no changes required. Asthma-- sees Dr Beaulah Dinning  GERD Fibrocystic Breast disease Recurrent cystitis Hyperlipidemia  DM--A1c 6.2 (1/ 2009) PACs sees Dr Deborah Chalk recurring UTIs, took  postcoital antibiotic for years, symptoms better after her hysterectomy 1996, symptoms re-surface in 2011  Past Surgical History: Reviewed history from 07/02/2009 and no changes required. Hysterectomy due to endometriosis and fibroids approximately 1996 NO oophorectomy Cataract extraction (06/07/2009) left Cataract extraction 06/28/09  right  Social History: Reviewed history from 03/17/2009 and no changes required. 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  NAD  Abdomen:   nondistended, soft, slightly tender at the suprapubic area, R>L; also very mild tenderness without rebound at the right flank. No CVA tenderness to percussion. Skin:   nor rash in the abdomen or back   Impression & Recommendations:  Problem # 1:  ? of URINARY CALCULUS (ICD-592.9)   her symptoms do suggest a urinary calculus. The last episode of sharp with either pain was yesterday, is entirely possible that she already passed a stone.  plan: Renal ultrasound  Urine culture  Macrobid for 2 additional days  return to the office or call if symptoms resurface  Orders: UA Dipstick w/o Micro (manual) (16109) T-Culture, Urine ([id number]) T-Urine Microscopic ([id number]) Radiology Referral (Radiology)  Complete Medication List: 1)  Metformin Hcl 500 Mg Tabs (Metformin hcl) .... Two times a day 2)  Flonase 50 Mcg/act Susp (Fluticasone propionate) 3)  Albuterol 90 Mcg/act Aers  (Albuterol) .... Inhale two puffs four times a day as needed 4)  Qvar 80 Mcg/act Aers (Beclomethasone dipropionate) .... As directed 5)  Atenolol 50 Mg Tabs (Atenolol) .Marland Kitchen.. 1 by mouth qd 6)  Crestor 10 Mg Tabs (Rosuvastatin calcium) .... As directed 7)  Nexium 40 Mg Cpdr (Esomeprazole magnesium) .... One by mouth every morning before breakfast 8)  Singulair 10 Mg Tabs (Montelukast sodium) .... Daily 9)  Macrobid 100 Mg Caps (Nitrofurantoin monohyd macro) .... As directed  Patient Instructions: 1)  lots of fluids 2)  Macrobid for 2 additional days 3)   call if fever, nausea, severe symptoms   Orders Added: 1)  UA Dipstick w/o Micro (manual) [81002] 2)  T-Culture, Urine [[id number]] 3)  T-Urine Microscopic [[id number]] 4)  Est. Patient Level IV [24401] 5)  Radiology Referral [Radiology]    Laboratory Results   Urine Tests    Routine Urinalysis   Color: yellow Appearance: Clear Glucose: negative   (Normal Range: Negative) Bilirubin: negative   (Normal Range: Negative) Ketone: negative   (Normal Range: Negative) Spec. Gravity: 1.015   (Normal Range: 1.003-1.035) Blood: trace-lysed   (Normal Range: Negative) pH: 5.0   (Normal Range: 5.0-8.0) Protein: negative   (Normal Range: Negative) Urobilinogen: 0.2   (Normal Range: 0-1) Nitrite: negative   (Normal Range: Negative) Leukocyte Esterace: negative   (Normal Range: Negative)    Comments: Army Fossa CMA  April 13, 2010 9:53 AM

## 2010-04-20 ENCOUNTER — Ambulatory Visit
Admission: RE | Admit: 2010-04-20 | Discharge: 2010-04-20 | Disposition: A | Discharge status: Home / Self Care | Payer: BC Managed Care – PPO | Source: Ambulatory Visit | Attending: Internal Medicine | Admitting: Internal Medicine

## 2010-04-20 DIAGNOSIS — N209 Urinary calculus, unspecified: Secondary | ICD-10-CM

## 2010-05-15 LAB — POCT I-STAT, CHEM 8
Calcium, Ion: 1.25 mmol/L (ref 1.12–1.32)
Creatinine, Ser: 0.7 mg/dL (ref 0.4–1.2)
Hemoglobin: 15.6 g/dL — ABNORMAL HIGH (ref 12.0–15.0)
Sodium: 143 mEq/L (ref 135–145)
TCO2: 23 mmol/L (ref 0–100)

## 2010-05-15 LAB — GLUCOSE, CAPILLARY: Glucose-Capillary: 193 mg/dL — ABNORMAL HIGH (ref 70–99)

## 2010-06-10 ENCOUNTER — Encounter: Payer: Self-pay | Admitting: Internal Medicine

## 2010-06-28 ENCOUNTER — Other Ambulatory Visit: Payer: Self-pay | Admitting: *Deleted

## 2010-06-28 NOTE — Telephone Encounter (Signed)
Pt called and states that she had been on Omeprazole in the past, was then switched to Nexium. She had an endoscopy and they found an hiatal hernia. Pt is in the process of getting a prior auth done on her Nexium. She states she has a $60 copay for nexium. She would like to know is it possible to go back to Omeprazole and possibly increase the dosing? Please advise.

## 2010-06-29 MED ORDER — OMEPRAZOLE 40 MG PO CPDR: 40.0000 mg | DELAYED_RELEASE_CAPSULE | Freq: Every day | ORAL | Status: DC

## 2010-06-29 NOTE — Telephone Encounter (Signed)
Pt aware of medication changed and sent to pharmacy.

## 2010-06-29 NOTE — Telephone Encounter (Signed)
Ok to take omeprazole 40mg  1 a day before breakfast , #90, 2 RF. If  Nexium works better for her, then switch back to Nexium whenever she has a chance

## 2010-06-29 NOTE — Telephone Encounter (Signed)
Message left for patient to return my call.  

## 2010-07-05 ENCOUNTER — Other Ambulatory Visit: Payer: Self-pay | Admitting: Internal Medicine

## 2010-07-05 MED ORDER — OMEPRAZOLE 40 MG PO CPDR: 40.0000 mg | DELAYED_RELEASE_CAPSULE | Freq: Every day | ORAL | Status: DC

## 2010-07-05 NOTE — Telephone Encounter (Signed)
Medication approved for Nexium from 06/14/10-07/05/11

## 2010-07-05 NOTE — Telephone Encounter (Signed)
Pharm did not receive

## 2010-07-29 ENCOUNTER — Other Ambulatory Visit: Payer: Self-pay | Admitting: Internal Medicine

## 2010-08-23 ENCOUNTER — Institutional Professional Consult (permissible substitution): Payer: BC Managed Care – PPO | Admitting: Cardiovascular Disease

## 2010-08-29 ENCOUNTER — Encounter: Payer: Self-pay | Admitting: Cardiovascular Disease

## 2010-08-30 ENCOUNTER — Institutional Professional Consult (permissible substitution): Payer: BC Managed Care – PPO | Admitting: Cardiovascular Disease

## 2010-09-12 ENCOUNTER — Other Ambulatory Visit: Payer: Self-pay | Admitting: Internal Medicine

## 2010-11-28 ENCOUNTER — Telehealth: Payer: Self-pay | Admitting: Internal Medicine

## 2010-11-28 ENCOUNTER — Other Ambulatory Visit: Payer: Self-pay | Admitting: Internal Medicine

## 2010-11-28 ENCOUNTER — Other Ambulatory Visit: Payer: Self-pay | Admitting: Cardiology

## 2010-11-28 NOTE — Telephone Encounter (Signed)
Patient needs refill metformin - 90 day supply [facility]

## 2010-12-13 ENCOUNTER — Encounter: Payer: Self-pay | Admitting: Nurse Practitioner

## 2010-12-13 ENCOUNTER — Ambulatory Visit (INDEPENDENT_AMBULATORY_CARE_PROVIDER_SITE_OTHER): Payer: BC Managed Care – PPO | Admitting: Nurse Practitioner

## 2010-12-13 VITALS — BP 128/92 | HR 64 | Ht 63.0 in | Wt 204.1 lb

## 2010-12-13 DIAGNOSIS — R030 Elevated blood-pressure reading, without diagnosis of hypertension: Secondary | ICD-10-CM | POA: Insufficient documentation

## 2010-12-13 DIAGNOSIS — E669 Obesity, unspecified: Secondary | ICD-10-CM

## 2010-12-13 DIAGNOSIS — E785 Hyperlipidemia, unspecified: Secondary | ICD-10-CM

## 2010-12-13 DIAGNOSIS — R002 Palpitations: Secondary | ICD-10-CM

## 2010-12-13 LAB — BASIC METABOLIC PANEL
BUN: 13 mg/dL (ref 6–23)
CO2: 29 mEq/L (ref 19–32)
Calcium: 9.3 mg/dL (ref 8.4–10.5)
Chloride: 105 mEq/L (ref 96–112)
Creatinine, Ser: 0.7 mg/dL (ref 0.4–1.2)
GFR: 87.53 mL/min (ref >60.00)
Glucose, Bld: 135 mg/dL — ABNORMAL HIGH (ref 70–99)
Potassium: 4.5 mEq/L (ref 3.5–5.1)
Sodium: 142 mEq/L (ref 135–145)

## 2010-12-13 LAB — HEPATIC FUNCTION PANEL
ALT: 24 U/L (ref 0–35)
AST: 22 U/L (ref 0–37)
Albumin: 4.1 g/dL (ref 3.5–5.2)
Alkaline Phosphatase: 56 U/L (ref 39–117)
Bilirubin, Direct: 0.1 mg/dL (ref 0.0–0.3)
Total Bilirubin: 0.6 mg/dL (ref 0.3–1.2)
Total Protein: 7.4 g/dL (ref 6.0–8.3)

## 2010-12-13 LAB — LIPID PANEL
Cholesterol: 124 mg/dL (ref 0–200)
HDL: 59.7 mg/dL (ref >39.00)
LDL Cholesterol: 52 mg/dL (ref 0–99)
Total CHOL/HDL Ratio: 2
Triglycerides: 61 mg/dL (ref 0.0–149.0)
VLDL: 12.2 mg/dL (ref 0.0–40.0)

## 2010-12-13 MED ORDER — ROSUVASTATIN CALCIUM 5 MG PO TABS: 10.0000 mg | ORAL_TABLET | Freq: Every day | ORAL | Status: DC

## 2010-12-13 MED ORDER — ATENOLOL 50 MG PO TABS: 50.0000 mg | ORAL_TABLET | Freq: Every day | ORAL | Status: DC

## 2010-12-13 NOTE — Patient Instructions (Addendum)
Try to get back on track with your diet and exercise program. Keep a check on your blood pressure at home. Goal is to be less than 120/80 most of the time.  We will check your labs today.  We will see you back in one year. You will see Dr. Elease Hashimoto at that time.  Call for any problems.

## 2010-12-13 NOTE — Assessment & Plan Note (Signed)
She reports satisfactory readings at home. She will continue to monitor. Goal is to be less than 120/80 with her diabetes.

## 2010-12-13 NOTE — Assessment & Plan Note (Signed)
We will be checking labs today. If lipids are not satisfactory, we will take her back to the 10 mg of Crestor.

## 2010-12-13 NOTE — Assessment & Plan Note (Signed)
She is not having any significant palpitations. She is doing well on her Atenolol. It is refilled today. We will tentatively see her back in one year. She was strongly encouraged to get back on track with her diet/exercise/weight loss. She needs to make her health her priority. Patient is agreeable to this plan and will call if any problems develop in the interim.

## 2010-12-13 NOTE — Progress Notes (Signed)
Karen Barnes Date of Birth: 04/22/1960 Medical Record #[id number]  History of Present Illness: Karen Barnes is seen today for her follow up visit. She is seen for Dr. Elease Hashimoto. She is a former patient of Dr. Ronnald Nian. She has had long standing palpitations, history of obesity and diabetes. Last stress test dates back to 2006. She has been doing well. No chest pain or shortness of breath. She doesn't really have much in the way of palpitations. Unfortunately, she has gotten off track with her diet and exercise program. She has returned to work part time. 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 She says she doesn't have the time now to focus on her. Blood pressure has been good at home. She has not had recent labs. She says Dr. Drue Novel cut her Crestor back to 5 mg. We will check her labs today.   Current Outpatient Prescriptions on File Prior to Visit  Medication Sig Dispense Refill  . aspirin 81 MG tablet Take 81 mg by mouth daily.        . Beclomethasone Dipropionate (QVAR IN) Inhale into the lungs daily.        . fexofenadine (ALLEGRA) 180 MG tablet Take 180 mg by mouth daily.        . fluticasone (FLONASE) 50 MCG/ACT nasal spray Place 2 sprays into the nose daily.        . metFORMIN (GLUCOPHAGE) 500 MG tablet TAKE 1 TABLET TWICE A DAY  180 tablet  0  . montelukast (SINGULAIR) 10 MG tablet Take 10 mg by mouth at bedtime.        . nitrofurantoin, macrocrystal-monohydrate, (MACROBID) 100 MG capsule TAKE AS DIRECTED  90 capsule  0  . omeprazole (PRILOSEC) 40 MG capsule Take 1 capsule (40 mg total) by mouth daily. Before breakfast.  90 capsule  2  . DISCONTD: atenolol (TENORMIN) 50 MG tablet TAKE 1 TABLET DAILY  15 tablet  0  . DISCONTD: CRESTOR 10 MG tablet TAKE AS DIRECTED  90 tablet  0    Allergies  Allergen Reactions  . Butorphanol Tartrate     REACTION: hallucinations  . Penicillins     Past Medical History  Diagnosis Date  . Palpitations   . Diabetes mellitus   . Asthma   .  GERD (gastroesophageal reflux disease)   . Obesity   . Hot flashes   . Hyperlipidemia   . Normal nuclear stress test 2006    Past Surgical History  Procedure Date  . Cataract extraction   . Cesarean section   . Abdominal hysterectomy   . Cardiovascular stress test 05/31/2004    EF 75%    History  Smoking status  . Never Smoker   Smokeless tobacco  . Not on file    History  Alcohol Use No    Family History  Problem Relation Age of Onset  . Mitral valve prolapse Mother   . Heart disease Father   . Diabetes Father     Review of Systems: The review of systems is positive for recent sinus infection that is now resolved. Some acid reflux but better with increased dose of PPI. No chest pain or shortness of breath.  All other systems were reviewed and are negative.  Physical Exam: BP 128/92  Pulse 64  Ht 5\' 3"  (1.6 m)  Wt 204 lb 1.9 oz (92.588 kg)  BMI 36.16 kg/m2 Patient is very pleasant and in no acute distress. She is obese. Skin is warm and dry. Color  is normal.  HEENT is unremarkable. Normocephalic/atraumatic. PERRL. Sclera are nonicteric. Neck is supple. No masses. No JVD. Lungs are clear. Cardiac exam shows a regular rate and rhythm. Abdomen is soft. Extremities are without edema. Gait and ROM are intact. No gross neurologic deficits noted.  LABORATORY DATA: PENDING   Assessment / Plan:

## 2010-12-13 NOTE — Assessment & Plan Note (Signed)
Strongly encouraged to get back on track with weight loss.

## 2011-03-27 ENCOUNTER — Other Ambulatory Visit: Payer: Self-pay | Admitting: Internal Medicine

## 2011-03-27 NOTE — Telephone Encounter (Signed)
Refill request prilosec 40mg . #90 with 1 refill. Last refilled 5.29.12. OK to refill?

## 2011-03-30 NOTE — Telephone Encounter (Signed)
Pt last seen 04-13-10, Advised that OV is needed Pt ok will call back later to schedule OV., Rx sent

## 2011-04-15 ENCOUNTER — Other Ambulatory Visit: Payer: Self-pay | Admitting: Internal Medicine

## 2011-04-17 NOTE — Telephone Encounter (Signed)
Prescription sent to pharmacy, no refills.  As per last refill request, patient was told to make appointment and did not.  Needs appointment.

## 2011-05-12 ENCOUNTER — Telehealth: Payer: Self-pay | Admitting: Internal Medicine

## 2011-05-12 NOTE — Telephone Encounter (Signed)
Coming in Monday 9:15am

## 2011-05-12 NOTE — Telephone Encounter (Signed)
Patient called and states she is due to come in for labs for her diabetes. Patient would like to schedule alb for next week sometime. Please put orders in so I can call her back to schedule her labs Thanks  Patient ph# [phone]

## 2011-05-12 NOTE — Telephone Encounter (Signed)
Karen Barnes go ahead & call her & schedule her labs for next week.   Dr. Drue Novel- which labs does she need & I will put the orders in.

## 2011-05-15 ENCOUNTER — Other Ambulatory Visit (INDEPENDENT_AMBULATORY_CARE_PROVIDER_SITE_OTHER): Payer: BC Managed Care – PPO

## 2011-05-15 ENCOUNTER — Telehealth: Payer: Self-pay | Admitting: *Deleted

## 2011-05-15 DIAGNOSIS — E785 Hyperlipidemia, unspecified: Secondary | ICD-10-CM

## 2011-05-15 DIAGNOSIS — E119 Type 2 diabetes mellitus without complications: Secondary | ICD-10-CM

## 2011-05-15 LAB — HEPATIC FUNCTION PANEL
ALT: 24 U/L (ref 0–35)
AST: 22 U/L (ref 0–37)
Albumin: 4.2 g/dL (ref 3.5–5.2)
Alkaline Phosphatase: 63 U/L (ref 39–117)
Total Bilirubin: 0.3 mg/dL (ref 0.3–1.2)

## 2011-05-15 LAB — COMPREHENSIVE METABOLIC PANEL
ALT: 24 U/L (ref 0–35)
Alkaline Phosphatase: 63 U/L (ref 39–117)
CO2: 27 mEq/L (ref 19–32)
Creatinine, Ser: 0.6 mg/dL (ref 0.4–1.2)
GFR: 105.22 mL/min (ref >60.00)
Total Bilirubin: 0.3 mg/dL (ref 0.3–1.2)

## 2011-05-15 NOTE — Telephone Encounter (Signed)
She is due for a office visit, Ok to do labs only if she schedules a f/u this week: A1C CMP microalbumine--- dx DM  FLP--- Dx hyperlipidemia

## 2011-05-18 LAB — HEMOGLOBIN A1C: Hgb A1c MFr Bld: 7.1 % — ABNORMAL HIGH (ref 4.6–6.5)

## 2011-05-19 ENCOUNTER — Ambulatory Visit (INDEPENDENT_AMBULATORY_CARE_PROVIDER_SITE_OTHER): Payer: BC Managed Care – PPO | Admitting: Internal Medicine

## 2011-05-19 VITALS — BP 126/80 | HR 70 | Temp 97.9°F | Wt 206.0 lb

## 2011-05-19 DIAGNOSIS — R7309 Other abnormal glucose: Secondary | ICD-10-CM

## 2011-05-19 DIAGNOSIS — R030 Elevated blood-pressure reading, without diagnosis of hypertension: Secondary | ICD-10-CM

## 2011-05-19 DIAGNOSIS — E785 Hyperlipidemia, unspecified: Secondary | ICD-10-CM

## 2011-05-19 MED ORDER — METFORMIN HCL 1000 MG PO TABS: 1000.0000 mg | ORAL_TABLET | Freq: Two times a day (BID) | ORAL | Status: DC

## 2011-05-19 NOTE — Assessment & Plan Note (Signed)
Had blood work 2 days ago, will add a FLP. if unable to add, we'll redraw.

## 2011-05-19 NOTE — Patient Instructions (Addendum)
Diabetics: Remember that you need your eyes checked at least once a year to be sure you don't have "retinopathy" As a diabetic patient, you need to be seen every 3 to 6 months on average, keeping  your followups is extremely important to be sure your sugar is well-controlled and you avoid long term complications. ------------------------------- Weight Watchers ? [employer]?. Exercise 30 minutes every day.

## 2011-05-19 NOTE — Assessment & Plan Note (Signed)
2 days ago,  her A1c was 7.1. CMP nl Discussed diet, exercise. Increase metformin to 1000 mg twice a day. Up-to-date on her eye exam

## 2011-05-19 NOTE — Progress Notes (Signed)
  Subjective:    Patient ID: Karen Barnes, female    DOB: 1960/06/20, 51 y.o.   MRN: [id number]  HPI Routine office visit, last visit a year ago. Diabetes, good medication compliance, sugars have been increased lately. Fasting around 135, postprandial about 186. High cholesterol, good medication compliance. Hypertension, good medication compliance, normal ambulatory BPs.  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     Objective:   Physical Exam  General -- alert, well-developed, and slt overweight. NAD  Lungs -- normal respiratory effort, no intercostal retractions, no accessory muscle use, and normal breath sounds.   Heart-- normal rate, regular rhythm, no murmur, and no gallop.   DIABETIC FEET EXAM: No lower extremity edema Normal pedal pulses bilaterally Skin and nails are normal without calluses Pinprick examination of the feet normal.      Assessment & Plan:

## 2011-05-19 NOTE — Assessment & Plan Note (Signed)
Normal BP today!

## 2011-05-19 NOTE — Telephone Encounter (Signed)
Patient is coming in on 05-15-11 to have labs done prior to 3 month f/u on 05-19-11. What labs will she need?

## 2011-05-24 ENCOUNTER — Encounter: Payer: Self-pay | Admitting: Internal Medicine

## 2011-06-29 ENCOUNTER — Other Ambulatory Visit: Payer: Self-pay | Admitting: Internal Medicine

## 2011-06-29 NOTE — Telephone Encounter (Signed)
Refill done.  

## 2011-07-04 ENCOUNTER — Other Ambulatory Visit: Payer: Self-pay | Admitting: *Deleted

## 2011-07-04 MED ORDER — OMEPRAZOLE 20 MG PO CPDR: 40.0000 mg | DELAYED_RELEASE_CAPSULE | Freq: Every day | ORAL | Status: DC

## 2011-07-04 NOTE — Progress Notes (Signed)
Pt called stating that she would like to have 20 mg instead of the 40 mg dosing because it is cheaper on her insurance to do 2 tab versus the 1 tab. New Rx sent to pharmacy.

## 2011-08-02 ENCOUNTER — Telehealth: Payer: Self-pay | Admitting: *Deleted

## 2011-08-02 NOTE — Telephone Encounter (Signed)
Noted  

## 2011-08-02 NOTE — Telephone Encounter (Signed)
Requested, received, completed & faxed back to [facility] Prior Authorization form for approval on Omeprazole 20 mg 60/30 days/SLS

## 2011-08-14 ENCOUNTER — Other Ambulatory Visit (INDEPENDENT_AMBULATORY_CARE_PROVIDER_SITE_OTHER): Payer: BC Managed Care – PPO

## 2011-08-14 DIAGNOSIS — E119 Type 2 diabetes mellitus without complications: Secondary | ICD-10-CM

## 2011-08-15 NOTE — Progress Notes (Signed)
Lab only 

## 2011-08-16 ENCOUNTER — Encounter: Payer: Self-pay | Admitting: *Deleted

## 2011-08-18 ENCOUNTER — Ambulatory Visit (INDEPENDENT_AMBULATORY_CARE_PROVIDER_SITE_OTHER): Payer: BC Managed Care – PPO | Admitting: Internal Medicine

## 2011-08-18 ENCOUNTER — Encounter: Payer: Self-pay | Admitting: Internal Medicine

## 2011-08-18 VITALS — BP 128/84 | HR 67 | Temp 98.4°F | Wt 203.0 lb

## 2011-08-18 DIAGNOSIS — Z789 Other specified health status: Secondary | ICD-10-CM | POA: Insufficient documentation

## 2011-08-18 DIAGNOSIS — R7309 Other abnormal glucose: Secondary | ICD-10-CM

## 2011-08-18 DIAGNOSIS — E785 Hyperlipidemia, unspecified: Secondary | ICD-10-CM

## 2011-08-18 DIAGNOSIS — R030 Elevated blood-pressure reading, without diagnosis of hypertension: Secondary | ICD-10-CM

## 2011-08-18 MED ORDER — PROMETHAZINE HCL 12.5 MG PO TABS
12.5000 mg | ORAL_TABLET | Freq: Three times a day (TID) | ORAL | Status: DC | PRN
Start: 2011-08-18 — End: 2014-09-16

## 2011-08-18 NOTE — Patient Instructions (Signed)
Come back in 6 months for the next hepatitis  shot Checked a CDC   page before you go to [place] Come back fasting next week: FLP AST ALT--- dx  Hyperlipidemia next routine visit with me in 4 months

## 2011-08-18 NOTE — Assessment & Plan Note (Signed)
Well-controlled per last A1c 

## 2011-08-18 NOTE — Assessment & Plan Note (Addendum)
Going to [place], request hep a vaccination. CDC webpage checked: Agree with hep A vaccination typhoid  fever is optional, plans to eat in a very controlled enviroment Does not need other shots Encouraged to read a CDC webpage  Request a prescription for Phenergan just in case, done

## 2011-08-18 NOTE — Assessment & Plan Note (Signed)
Due for labs, see instructions  

## 2011-08-18 NOTE — Progress Notes (Signed)
  Subjective:    Patient ID: Karen Barnes, female    DOB: September 10, 1960, 51 y.o.   MRN: [id number]  HPI Routine office visit Diabetes, CBG is within normal, good medication compliance. Hypertension, ambulatory blood pressures within normal travel advise, going to [place] on a mission trip, [place].   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 No nausea, vomiting, diarrhea.    Objective:   Physical Exam General -- alert, well-developed, and overweight appearing. No apparent distress.  Lungs -- normal respiratory effort, no intercostal retractions, no accessory muscle use, and normal breath sounds.   Heart-- normal rate, regular rhythm, no murmur, and no gallop.   Extremities-- no pretibial edema bilaterally  psych-- Cognition and judgment appear intact. Alert and cooperative with normal attention span and concentration.  not anxious appearing and not depressed appearing.       Assessment & Plan:

## 2011-08-18 NOTE — Assessment & Plan Note (Signed)
Good amb BP readings.

## 2011-08-21 ENCOUNTER — Other Ambulatory Visit (INDEPENDENT_AMBULATORY_CARE_PROVIDER_SITE_OTHER): Payer: BC Managed Care – PPO

## 2011-08-21 DIAGNOSIS — E785 Hyperlipidemia, unspecified: Secondary | ICD-10-CM

## 2011-08-21 LAB — AST: AST: 22 U/L (ref 0–37)

## 2011-08-21 LAB — LIPID PANEL
HDL: 62.2 mg/dL (ref >39.00)
Total CHOL/HDL Ratio: 2
VLDL: 18.6 mg/dL (ref 0.0–40.0)

## 2011-08-24 ENCOUNTER — Encounter: Payer: Self-pay | Admitting: *Deleted

## 2011-11-24 ENCOUNTER — Other Ambulatory Visit: Payer: Self-pay | Admitting: Internal Medicine

## 2011-11-27 ENCOUNTER — Other Ambulatory Visit: Payer: Self-pay

## 2011-11-27 MED ORDER — METFORMIN HCL 1000 MG PO TABS: 1000.0000 mg | ORAL_TABLET | Freq: Two times a day (BID) | ORAL | Status: DC

## 2011-11-27 NOTE — Telephone Encounter (Signed)
Rx sent pt aware.   MW  

## 2011-12-17 ENCOUNTER — Other Ambulatory Visit: Payer: Self-pay | Admitting: Nurse Practitioner

## 2011-12-20 ENCOUNTER — Other Ambulatory Visit: Payer: Self-pay | Admitting: Obstetrics and Gynecology

## 2011-12-20 DIAGNOSIS — R928 Other abnormal and inconclusive findings on diagnostic imaging of breast: Secondary | ICD-10-CM

## 2011-12-26 ENCOUNTER — Ambulatory Visit
Admission: RE | Admit: 2011-12-26 | Discharge: 2011-12-26 | Disposition: A | Discharge status: Home / Self Care | Payer: BC Managed Care – PPO | Source: Ambulatory Visit | Attending: Obstetrics and Gynecology | Admitting: Obstetrics and Gynecology

## 2011-12-26 DIAGNOSIS — R928 Other abnormal and inconclusive findings on diagnostic imaging of breast: Secondary | ICD-10-CM

## 2012-01-03 ENCOUNTER — Telehealth: Payer: Self-pay | Admitting: Nurse Practitioner

## 2012-01-03 MED ORDER — ATENOLOL 50 MG PO TABS: 50.0000 mg | ORAL_TABLET | Freq: Every day | ORAL | Status: DC

## 2012-01-03 NOTE — Telephone Encounter (Signed)
New Problem:     Patient called in needing a refill of her atenolol (TENORMIN) 50 MG tablet.

## 2012-01-11 ENCOUNTER — Telehealth: Payer: Self-pay | Admitting: Genetic Counselor

## 2012-01-11 NOTE — Telephone Encounter (Signed)
S/W pt in re Genetic Appt 02/10 @ 1 w/Karen Lowell [employer]. Referring Dr. Luster Landsberg Packet mailed.

## 2012-01-15 ENCOUNTER — Other Ambulatory Visit: Payer: Self-pay | Admitting: Obstetrics and Gynecology

## 2012-01-15 DIAGNOSIS — Z803 Family history of malignant neoplasm of breast: Secondary | ICD-10-CM

## 2012-01-16 ENCOUNTER — Other Ambulatory Visit: Payer: Self-pay | Admitting: Cardiology

## 2012-01-17 NOTE — Telephone Encounter (Signed)
New problem:   Atenolol 50 mg    [facility] [phone].

## 2012-01-18 ENCOUNTER — Other Ambulatory Visit: Payer: Self-pay | Admitting: *Deleted

## 2012-01-18 MED ORDER — ATENOLOL 50 MG PO TABS: 50.0000 mg | ORAL_TABLET | Freq: Every day | ORAL | Status: DC

## 2012-02-06 ENCOUNTER — Encounter: Payer: Self-pay | Admitting: Cardiology

## 2012-02-06 ENCOUNTER — Ambulatory Visit (INDEPENDENT_AMBULATORY_CARE_PROVIDER_SITE_OTHER): Payer: BC Managed Care – PPO | Admitting: Cardiology

## 2012-02-06 VITALS — BP 132/77 | HR 74 | Ht 64.0 in | Wt 203.0 lb

## 2012-02-06 DIAGNOSIS — Z9189 Other specified personal risk factors, not elsewhere classified: Secondary | ICD-10-CM

## 2012-02-06 DIAGNOSIS — Z789 Other specified health status: Secondary | ICD-10-CM

## 2012-02-06 DIAGNOSIS — I491 Atrial premature depolarization: Secondary | ICD-10-CM

## 2012-02-06 DIAGNOSIS — E785 Hyperlipidemia, unspecified: Secondary | ICD-10-CM

## 2012-02-06 MED ORDER — ATENOLOL 50 MG PO TABS: 50.0000 mg | ORAL_TABLET | Freq: Every day | ORAL | Status: DC

## 2012-02-06 MED ORDER — ROSUVASTATIN CALCIUM 5 MG PO TABS: 5.0000 mg | ORAL_TABLET | Freq: Every day | ORAL | Status: DC

## 2012-02-06 NOTE — Patient Instructions (Addendum)
Your physician wants you to follow-up in: 1 year with Dr McLean. (December 2014).You will receive a reminder letter in the mail two months in advance. If you don't receive a letter, please call our office to schedule the follow-up appointment.  

## 2012-02-07 DIAGNOSIS — Z9189 Other specified personal risk factors, not elsewhere classified: Secondary | ICD-10-CM | POA: Insufficient documentation

## 2012-02-07 NOTE — Progress Notes (Signed)
Patient ID: Karen Barnes, female   DOB: [birth year], 52 y.o.   MRN: [id number] PCP: Dr. Drue Novel  52 yo with history of type II diabetes, HTN, and symptomatica palpitations presents for cardiology followup.  She has a strong family history of premature CAD.  She has been seen by Dr. Deborah Chalk in the past and is seen by me for the first time today.  On atenolol, she is not having palpitations.  She recently started exercising at the [facility].  She walks for about 2 miles.  Lately she has been doing this every day.  No exertional chest pain or dyspnea.  BP has been in normal range when she checks at home.  Weight is stable.   ECG: NSR, normal  Labs (4/13): K 4.4, creatinine 0.6 Labs (7/13): LDL 56, HDL 62  PMH:  1. GERD 2. Type II diabetes 3. Hyperlipidemia 4. Palpitations: Symptomatic PACs. 5. Normal stress test in 2006.  6. Asthma  SH: Nonsmoker.  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  Married.  FH: Father with MI at 7 and later CABG.  Mother with atrial fibrillation.  Two grandparents with presumed SCD.  Uncles with MIs in their 52s.   ROS: All systems reviewed and negative except as per HPI.   Current Outpatient Prescriptions  Medication Sig Dispense Refill  . aspirin 81 MG tablet Take 81 mg by mouth daily.        Marland Kitchen atenolol (TENORMIN) 50 MG tablet Take 1 tablet (50 mg total) by mouth daily.  90 tablet  3  . Beclomethasone Dipropionate (QVAR IN) Inhale into the lungs daily.        . fexofenadine (ALLEGRA) 180 MG tablet Take 180 mg by mouth daily.        . fluticasone (FLONASE) 50 MCG/ACT nasal spray Place 2 sprays into the nose daily.        . metFORMIN (GLUCOPHAGE) 1000 MG tablet Take 1 tablet (1,000 mg total) by mouth 2 (two) times daily with a meal.  180 tablet  1  . montelukast (SINGULAIR) 10 MG tablet Take 10 mg by mouth at bedtime.        . nitrofurantoin, macrocrystal-monohydrate, (MACROBID) 100 MG capsule TAKE AS DIRECTED  90 capsule  0  . omeprazole (PRILOSEC) 20 MG capsule Take  2 capsules (40 mg total) by mouth daily.  180 capsule  3  . promethazine (PHENERGAN) 12.5 MG tablet Take 1 tablet (12.5 mg total) by mouth every 8 (eight) hours as needed for nausea.  20 tablet  0  . rosuvastatin (CRESTOR) 5 MG tablet Take 1 tablet (5 mg total) by mouth daily.  90 tablet  3    BP 132/77  Pulse 74  Ht 5\' 4"  (1.626 m)  Wt 203 lb (92.08 kg)  BMI 34.84 kg/m2 General: NAD, overweight Neck: No JVD, no thyromegaly or thyroid nodule.  Lungs: Clear to auscultation bilaterally with normal respiratory effort. CV: Nondisplaced PMI.  Heart regular S1/S2, no S3/S4, no murmur.  No peripheral edema.  No carotid bruit.  Normal pedal pulses.  Abdomen: Soft, nontender, no hepatosplenomegaly, no distention.  Neurologic: Alert and oriented x 3.  Psych: Normal affect. Extremities: No clubbing or cyanosis.   Assessment/Plan: 1. Palpitations: History of PACs.  These seem to have resolved with use of atenolol.  Her mother had atrial fibrillation so probably at a bit higher risk for this.  2. CAD risk: Strong family history of premature CAD as well as history of DM and hyperlipidemia.  No  ischemic symptoms.  Continue statin and ASA 81.  Continue regular exercise and efforts at weight loss.  3. Hyperlipidemia: Excellent lipid profile in 7/13.  Given family history and diabetes, would continue statin long-term.   Marca Ancona 02/07/2012

## 2012-02-17 ENCOUNTER — Ambulatory Visit: Payer: Self-pay | Admitting: General Practice

## 2012-02-17 ENCOUNTER — Encounter: Payer: Self-pay | Admitting: Family Medicine

## 2012-02-17 ENCOUNTER — Ambulatory Visit (INDEPENDENT_AMBULATORY_CARE_PROVIDER_SITE_OTHER): Payer: BC Managed Care – PPO | Admitting: Family Medicine

## 2012-02-17 VITALS — BP 130/82 | HR 61 | Temp 97.4°F | Resp 16 | Wt 201.8 lb

## 2012-02-17 DIAGNOSIS — J45909 Unspecified asthma, uncomplicated: Secondary | ICD-10-CM

## 2012-02-17 MED ORDER — PREDNISONE 20 MG PO TABS: ORAL_TABLET | ORAL | Status: DC

## 2012-02-17 MED ORDER — HYDROCODONE-HOMATROPINE 5-1.5 MG/5ML PO SYRP: ORAL_SOLUTION | ORAL | Status: DC

## 2012-02-17 NOTE — Patient Instructions (Addendum)
Drink lots of water  Take the prednisone as directed  Hydromet 1/2-1 teaspoon 3 times daily when necessary for cough and cold  Albuterol 2 puffs 3 times daily when necessary  Continue your other medications  Followup office visit when necessary
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
## 2012-02-19 ENCOUNTER — Ambulatory Visit: Payer: BC Managed Care – PPO | Admitting: Internal Medicine

## 2012-02-23 ENCOUNTER — Ambulatory Visit (INDEPENDENT_AMBULATORY_CARE_PROVIDER_SITE_OTHER): Payer: BC Managed Care – PPO | Admitting: Internal Medicine

## 2012-02-23 VITALS — BP 132/80 | HR 61 | Temp 97.6°F | Wt 197.0 lb

## 2012-02-23 DIAGNOSIS — J45909 Unspecified asthma, uncomplicated: Secondary | ICD-10-CM

## 2012-02-23 DIAGNOSIS — Z803 Family history of malignant neoplasm of breast: Secondary | ICD-10-CM | POA: Insufficient documentation

## 2012-02-23 DIAGNOSIS — R7309 Other abnormal glucose: Secondary | ICD-10-CM

## 2012-02-23 LAB — BASIC METABOLIC PANEL
CO2: 28 mEq/L (ref 19–32)
Chloride: 98 mEq/L (ref 96–112)
Sodium: 136 mEq/L (ref 135–145)

## 2012-02-23 NOTE — Assessment & Plan Note (Signed)
Good medication compliance, ambulatory blood sugars around 120 (160s in the last few days, she is on prednisone). To see the eye doctor 03-2012. Lifestyle has improved compared to previous visits, exercising more.

## 2012-02-23 NOTE — Assessment & Plan Note (Signed)
Patient reports today a positive family history of breast cancer, to see a genetic counselor soon.

## 2012-02-23 NOTE — Assessment & Plan Note (Signed)
Recovering from an exacerbation, finished prednisone a few days. Once she ends prednisone, will call for a flu shot. Other than the exacerbation, asthma is very well controlled

## 2012-02-23 NOTE — Patient Instructions (Addendum)
Please come back in approximately 8-10 days for your flu shot, next visit in 4 months for a physical

## 2012-02-23 NOTE — Progress Notes (Signed)
  Subjective:    Patient ID: Karen Barnes, female    DOB: Dec 30, 1960, 52 y.o.   MRN: [id number]  HPI Routine office visit Had respiratory symptoms for the last few weeks, eventually was seen at another clinic, prescribed prednisone and Hydromet. Is doing much better. Finished prednisone a few days. Other than the asthma exacerbation, she rarely  uses Ventolin. Saw cardiology not long ago, all cardiovascular issues stable. Has a family history of breast cancer, to see a  genetic counselor soon.  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 Exercising more, going most days today gym and walks in the morning. Blood sugars in the morning are around 120, in the last few days call she's taking prednisone CBGs have increased to the 160s.     Objective:   Physical Exam General -- alert, well-developed, and overweight appearing. No apparent distress.  Lungs -- normal respiratory effort, no intercostal retractions, no accessory muscle use, and normal breath sounds.   Heart-- normal rate, regular rhythm, no murmur, and no gallop.   extremities-- no pretibial edema bilaterally  Neurologic-- alert & oriented X3 and strength normal in all extremities. Psych-- Cognition and judgment appear intact. Alert and cooperative with normal attention span and concentration.  not anxious appearing  and not depressed appearing.      Assessment & Plan:

## 2012-02-25 ENCOUNTER — Encounter: Payer: Self-pay | Admitting: Internal Medicine

## 2012-02-26 ENCOUNTER — Encounter: Payer: Self-pay | Admitting: Internal Medicine

## 2012-02-26 ENCOUNTER — Other Ambulatory Visit: Payer: BC Managed Care – PPO

## 2012-03-12 ENCOUNTER — Encounter: Payer: Self-pay | Admitting: Internal Medicine

## 2012-03-18 ENCOUNTER — Encounter: Payer: BC Managed Care – PPO | Admitting: Genetic Counselor

## 2012-03-18 ENCOUNTER — Other Ambulatory Visit: Payer: BC Managed Care – PPO | Admitting: Lab

## 2012-03-24 ENCOUNTER — Encounter: Payer: Self-pay | Admitting: Internal Medicine

## 2012-04-02 ENCOUNTER — Ambulatory Visit: Payer: BC Managed Care – PPO | Admitting: Internal Medicine

## 2012-04-18 ENCOUNTER — Other Ambulatory Visit: Payer: Self-pay | Admitting: Internal Medicine

## 2012-05-22 ENCOUNTER — Other Ambulatory Visit: Payer: Self-pay | Admitting: Internal Medicine

## 2012-05-23 NOTE — Telephone Encounter (Signed)
Refill done.  

## 2012-07-23 ENCOUNTER — Telehealth: Payer: Self-pay | Admitting: Internal Medicine

## 2012-07-23 NOTE — Telephone Encounter (Signed)
Patient Information:  Caller Name: Julie-Ann  Phone: [phone]  Patient: Karen Barnes, Karen Barnes  Gender: Female  DOB: 1961-01-06  Age: 52 Years  PCP: Willow Ora  Pregnant: No  Office Follow Up:  Does the office need to follow up with this patient?: No  Instructions For The Office: N/A  RN Note:  transferred to [facility] in the office for nurse injection appt  Symptoms  Reason For Call & Symptoms: Deyra is inquiring about a DTaP vaccine for her and her husband; says daughter is pregnant and OB/GYN has recommended the vaccine for those in contact with the baby  Reviewed Health History In EMR: N/A  Reviewed Medications In EMR: N/A  Reviewed Allergies In EMR: N/A  Reviewed Surgeries / Procedures: N/A  Date of Onset of Symptoms: Unknown OB / GYN:  LMP: Unknown  Guideline(s) Used:  No Protocol Available - Information Only  Disposition Per Guideline:   Home Care  Reason For Disposition Reached:   Information only question and nurse able to answer  Advice Given:  N/A  Patient Will Follow Care Advice:  YES

## 2012-07-23 NOTE — Telephone Encounter (Signed)
Spoke to pt, she is coming in on 6.20.14 to have a t-dap.

## 2012-07-26 ENCOUNTER — Ambulatory Visit: Payer: BC Managed Care – PPO

## 2012-09-02 ENCOUNTER — Telehealth: Payer: Self-pay | Admitting: *Deleted

## 2012-09-02 NOTE — Telephone Encounter (Signed)
Clydie Braun at [facility] called and left me a voicemail about notes for the Feb appt w/ genetics.  Called and left a message for Clydie Braun letting her know that the pt was a no show and if she wanted me to reschedule to please let me know and I would call the pt.

## 2012-09-08 ENCOUNTER — Other Ambulatory Visit: Payer: Self-pay | Admitting: Internal Medicine

## 2012-09-09 NOTE — Telephone Encounter (Signed)
Refill done per protocol.  

## 2012-09-12 ENCOUNTER — Telehealth: Payer: Self-pay | Admitting: *Deleted

## 2012-09-12 NOTE — Telephone Encounter (Signed)
Pharmacy requested a prior authorization for omeprazole 20 mg 09/09/12 and it was approved same day 09/09/12 thru 09/09/13. Pharmacy was notified. AG cma

## 2012-09-16 ENCOUNTER — Telehealth: Payer: Self-pay | Admitting: *Deleted

## 2012-09-16 NOTE — Telephone Encounter (Signed)
Karen Barnes called and stated that I do not need to reschedule her.  She will mail her a letter.

## 2012-11-26 ENCOUNTER — Other Ambulatory Visit: Payer: Self-pay | Admitting: Internal Medicine

## 2012-11-26 NOTE — Telephone Encounter (Signed)
Metformin refill sent to pharmacy. OV/Labs due

## 2012-12-12 ENCOUNTER — Other Ambulatory Visit: Payer: Self-pay

## 2012-12-29 ENCOUNTER — Other Ambulatory Visit: Payer: Self-pay | Admitting: Internal Medicine

## 2013-01-09 ENCOUNTER — Other Ambulatory Visit: Payer: Self-pay | Admitting: Obstetrics and Gynecology

## 2013-01-09 DIAGNOSIS — R928 Other abnormal and inconclusive findings on diagnostic imaging of breast: Secondary | ICD-10-CM

## 2013-01-10 ENCOUNTER — Encounter: Payer: Self-pay | Admitting: Internal Medicine

## 2013-01-10 ENCOUNTER — Ambulatory Visit (INDEPENDENT_AMBULATORY_CARE_PROVIDER_SITE_OTHER): Payer: BC Managed Care – PPO | Admitting: Internal Medicine

## 2013-01-10 VITALS — BP 123/74 | HR 74 | Temp 98.0°F | Ht 64.5 in | Wt 207.0 lb

## 2013-01-10 DIAGNOSIS — Z0000 Encounter for general adult medical examination without abnormal findings: Secondary | ICD-10-CM | POA: Insufficient documentation

## 2013-01-10 DIAGNOSIS — R7309 Other abnormal glucose: Secondary | ICD-10-CM

## 2013-01-10 DIAGNOSIS — E785 Hyperlipidemia, unspecified: Secondary | ICD-10-CM

## 2013-01-10 DIAGNOSIS — Z803 Family history of malignant neoplasm of breast: Secondary | ICD-10-CM

## 2013-01-10 MED ORDER — OMEPRAZOLE 20 MG PO CPDR: DELAYED_RELEASE_CAPSULE | ORAL | Status: DC

## 2013-01-10 MED ORDER — NITROFURANTOIN MONOHYD MACRO 100 MG PO CAPS: ORAL_CAPSULE | ORAL | Status: DC

## 2013-01-10 MED ORDER — METFORMIN HCL 1000 MG PO TABS: ORAL_TABLET | ORAL | Status: DC

## 2013-01-10 NOTE — Progress Notes (Signed)
   Subjective:    Patient ID: Karen Barnes, female    DOB: 02-10-1960, 52 y.o.   MRN: [id number]  HPI CPX   Past Medical History  Diagnosis Date  . Palpitations     PACs sees cards   . Diabetes mellitus     A1C 6.2---->  2009  . Asthma     sees Dr Beatriz Stallion  . GERD (gastroesophageal reflux disease)   . Hot flashes   . Hyperlipidemia   . Normal nuclear stress test 2006  . Fibrocystic breast   . Recurrent cystitis     took  postcoital antibiotic for years, symptoms better after her hysterectomy 1996, symptoms re-surface in 2011   Past Surgical History  Procedure Laterality Date  . Cataract extraction Bilateral 2011  . Cesarean section    . Abdominal hysterectomy      due to endometriosis and fibroids approximately 1996   History   Social History  . Marital Status: Married    Spouse Name: N/A    Number of Children: 1  . Years of Education: N/A   Occupational History  . retired principal    Social History Main Topics  . Smoking status: Never Smoker   . Smokeless tobacco: Never Used  . Alcohol Use: No  . Drug Use: No  . Sexual Activity: Yes   Other Topics Concern  . Not on file   Social History Narrative  . No narrative on file   Family History  Problem Relation Age of Onset  . Mitral valve prolapse Mother   . Heart disease Father     early   . Diabetes Father   . Breast cancer Mother     M had ca insitu (age 78), aunt   . Colon cancer Neg Hx   . Brain cancer Other     2 fam members   . Lung cancer Other     uncle, heavy smoker    Review of Systems In general feels very well. Diabetes, good compliance with metformin, ambulatory CBGs around 120, 140. Good compliance with cholesterol medication,Crestor Compliance with atenolol, ambulatory BPs always 100%. She is due to see the eye doctor and plans to see him in few days. No lower extremity paresthesias.     Objective:   Physical Exam BP 123/74  Pulse 74  Temp(Src) 98 F (36.7 C)  Ht 5'  4.5" (1.638 m)  Wt 207 lb (93.895 kg)  BMI 35.00 kg/m2  SpO2 98% General -- alert, well-developed, NAD.  Neck --no thyromegaly Lungs -- normal respiratory effort, no intercostal retractions, no accessory muscle use, and normal breath sounds.  Heart-- normal rate, regular rhythm, no murmur.  Abdomen-- Not distended, good bowel sounds,soft, non-tender. DIABETIC FEET EXAM: No lower extremity edema Normal pedal pulses bilaterally Skin normal, nails normal, no calluses Pinprick examination of the feet normal. Neurologic--  alert & oriented X3. Speech normal, gait normal, strength normal in all extremities.  Psych-- Cognition and judgment appear intact. Cooperative with normal attention span and concentration. No anxious appearing , no depressed appearing.      Assessment & Plan:

## 2013-01-10 NOTE — Progress Notes (Signed)
Pre visit review using our clinic review tool, if applicable. No additional management support is needed unless otherwise documented below in the visit note. 

## 2013-01-10 NOTE — Assessment & Plan Note (Addendum)
Td 09 Had a flu shot zostavax-- discussed cscope ~ 05-17-10 Dr Daphene Calamity, repeat 5 years ,see report of bx MMG due 12-2012, has an appointment already Gyn Dr Marcelle Overlie , recently seen Discussed diet, exercise. Patient is in her menopause, never had a bone density test, order a DEXA Rrecommend calcium and vitamin D

## 2013-01-10 NOTE — Patient Instructions (Signed)
Please come back fasting: CBC, CMP, TSH, FLP, vitamin D--- dx v70 Hemoglobin A1c microalbumin--- dx DM  Next visit for a   follow up (30 minutes)  regards diabetes hypertension , no fasting, in 6 months  Please make an appointment     [facility] web site for [email or web address]  "The [facility] Diabetes diet" book

## 2013-01-10 NOTE — Assessment & Plan Note (Signed)
Good compliance with medications Has an eye appointment for the end of December 2014. Feet exam negative today Labs

## 2013-01-10 NOTE — Assessment & Plan Note (Signed)
Good compliance with Crestor, labs

## 2013-01-10 NOTE — Assessment & Plan Note (Addendum)
See previous entry, did not talk w/ a counselor , states that radiology felt MRI breast was not neccessary (2013)

## 2013-01-12 ENCOUNTER — Encounter: Payer: Self-pay | Admitting: Internal Medicine

## 2013-01-13 ENCOUNTER — Ambulatory Visit
Admission: RE | Admit: 2013-01-13 | Discharge: 2013-01-13 | Disposition: A | Discharge status: Home / Self Care | Payer: BC Managed Care – PPO | Source: Ambulatory Visit | Attending: Obstetrics and Gynecology | Admitting: Obstetrics and Gynecology

## 2013-01-13 DIAGNOSIS — R928 Other abnormal and inconclusive findings on diagnostic imaging of breast: Secondary | ICD-10-CM

## 2013-01-14 ENCOUNTER — Other Ambulatory Visit (INDEPENDENT_AMBULATORY_CARE_PROVIDER_SITE_OTHER): Payer: BC Managed Care – PPO

## 2013-01-14 DIAGNOSIS — Z0000 Encounter for general adult medical examination without abnormal findings: Secondary | ICD-10-CM

## 2013-01-14 LAB — CBC WITH DIFFERENTIAL/PLATELET
Basophils Absolute: 0 10*3/uL (ref 0.0–0.1)
Eosinophils Absolute: 0.1 10*3/uL (ref 0.0–0.7)
Hemoglobin: 12.5 g/dL (ref 12.0–15.0)
Lymphocytes Relative: 35.9 % (ref 12.0–46.0)
MCHC: 32.9 g/dL (ref 30.0–36.0)
MCV: 83.4 fl (ref 78.0–100.0)
Monocytes Absolute: 0.3 10*3/uL (ref 0.1–1.0)
Monocytes Relative: 8.2 % (ref 3.0–12.0)
Neutro Abs: 2.2 10*3/uL (ref 1.4–7.7)
Neutrophils Relative %: 52 % (ref 43.0–77.0)
Platelets: 212 10*3/uL (ref 150.0–400.0)
RDW: 13.5 % (ref 11.5–14.6)

## 2013-01-14 LAB — LIPID PANEL
Cholesterol: 143 mg/dL (ref 0–200)
LDL Cholesterol: 73 mg/dL (ref 0–99)
Total CHOL/HDL Ratio: 2
Triglycerides: 64 mg/dL (ref 0.0–149.0)
VLDL: 12.8 mg/dL (ref 0.0–40.0)

## 2013-01-14 LAB — COMPREHENSIVE METABOLIC PANEL
ALT: 25 U/L (ref 0–35)
AST: 20 U/L (ref 0–37)
Alkaline Phosphatase: 54 U/L (ref 39–117)
BUN: 13 mg/dL (ref 6–23)
Calcium: 8.8 mg/dL (ref 8.4–10.5)
Chloride: 101 mEq/L (ref 96–112)
Creatinine, Ser: 0.7 mg/dL (ref 0.4–1.2)
Potassium: 3.7 mEq/L (ref 3.5–5.1)
Sodium: 136 mEq/L (ref 135–145)
Total Protein: 6.7 g/dL (ref 6.0–8.3)

## 2013-01-14 LAB — MICROALBUMIN / CREATININE URINE RATIO: Creatinine,U: 74.3 mg/dL

## 2013-01-21 ENCOUNTER — Telehealth: Payer: Self-pay | Admitting: *Deleted

## 2013-01-21 MED ORDER — SITAGLIPTIN PHOSPHATE 100 MG PO TABS: 100.0000 mg | ORAL_TABLET | Freq: Every day | ORAL | Status: DC

## 2013-01-21 NOTE — Telephone Encounter (Signed)
Pt notified of lab results. Agrees to try [place], rx ordered and samples ready for pick up. Pt scheduled for 04/25/13 for follow/up.

## 2013-02-07 ENCOUNTER — Other Ambulatory Visit: Payer: Self-pay | Admitting: Cardiology

## 2013-02-14 ENCOUNTER — Other Ambulatory Visit: Payer: BC Managed Care – PPO

## 2013-02-21 ENCOUNTER — Ambulatory Visit: Payer: BC Managed Care – PPO | Admitting: Cardiology

## 2013-02-27 ENCOUNTER — Other Ambulatory Visit: Payer: Self-pay | Admitting: *Deleted

## 2013-02-27 DIAGNOSIS — E785 Hyperlipidemia, unspecified: Secondary | ICD-10-CM

## 2013-02-27 MED ORDER — ATENOLOL 50 MG PO TABS: 50.0000 mg | ORAL_TABLET | Freq: Every day | ORAL | Status: DC

## 2013-03-07 ENCOUNTER — Other Ambulatory Visit: Payer: BC Managed Care – PPO

## 2013-03-07 ENCOUNTER — Ambulatory Visit (INDEPENDENT_AMBULATORY_CARE_PROVIDER_SITE_OTHER)
Admission: RE | Admit: 2013-03-07 | Discharge: 2013-03-07 | Disposition: A | Discharge status: Home / Self Care | Payer: BC Managed Care – PPO | Source: Ambulatory Visit | Attending: Internal Medicine | Admitting: Internal Medicine

## 2013-03-07 DIAGNOSIS — Z0000 Encounter for general adult medical examination without abnormal findings: Secondary | ICD-10-CM

## 2013-03-17 ENCOUNTER — Encounter: Payer: Self-pay | Admitting: Cardiology

## 2013-03-17 ENCOUNTER — Ambulatory Visit (INDEPENDENT_AMBULATORY_CARE_PROVIDER_SITE_OTHER): Payer: BC Managed Care – PPO | Admitting: Cardiology

## 2013-03-17 VITALS — BP 133/59 | HR 77 | Ht 64.0 in | Wt 204.0 lb

## 2013-03-17 DIAGNOSIS — I491 Atrial premature depolarization: Secondary | ICD-10-CM

## 2013-03-17 DIAGNOSIS — Z9189 Other specified personal risk factors, not elsewhere classified: Secondary | ICD-10-CM

## 2013-03-17 DIAGNOSIS — E785 Hyperlipidemia, unspecified: Secondary | ICD-10-CM

## 2013-03-17 DIAGNOSIS — Z789 Other specified health status: Secondary | ICD-10-CM

## 2013-03-17 MED ORDER — ATENOLOL 50 MG PO TABS: 50.0000 mg | ORAL_TABLET | Freq: Every day | ORAL | Status: DC

## 2013-03-17 MED ORDER — ROSUVASTATIN CALCIUM 5 MG PO TABS: ORAL_TABLET | ORAL | Status: DC

## 2013-03-17 NOTE — Patient Instructions (Signed)
Your physician wants you to follow-up in: 1 year with Dr McLean. (February 2016).You will receive a reminder letter in the mail two months in advance. If you don't receive a letter, please call our office to schedule the follow-up appointment.  

## 2013-03-18 NOTE — Progress Notes (Signed)
Patient ID: Karen Barnes, female   DOB: 03/16/1960, 53 y.o.   MRN: [id number] PCP: Dr. Drue NovelPaz  53 yo with history of type II diabetes, HTN, and symptomatica palpitations presents for cardiology followup.  She has a strong family history of premature CAD.  On atenolol, she has very few palpitations.  She has not been as active over the last couple of months because she had a bad achilles tendon strain.  She is just now getting back into exercise: treadmill, elliptical.  No chest pain, no exertional dyspnea.  She has occasional wheezing from her asthma.   ECG: NSR, normal  Labs (4/13): K 4.4, creatinine 0.6 Labs (7/13): LDL 56, HDL 62 Labs (12/14): K 3.7, creatinine 0.7, LDL 73, HDL 58  PMH:  1. GERD 2. Type II diabetes 3. Hyperlipidemia 4. Palpitations: Symptomatic PACs. 5. Normal stress test in 2006.  6. Asthma  SH: Nonsmoker.  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  Married.  FH: Father with MI at 8647 and later CABG.  Mother with atrial fibrillation.  Two grandparents with presumed SCD.  Uncles with MIs in their 8450s.    Current Outpatient Prescriptions  Medication Sig Dispense Refill  . albuterol (PROVENTIL HFA;VENTOLIN HFA) 108 (90 BASE) MCG/ACT inhaler Inhale 2 puffs into the lungs every 6 (six) hours as needed.      Marland Kitchen. aspirin 81 MG tablet Take 81 mg by mouth daily.        Marland Kitchen. atenolol (TENORMIN) 50 MG tablet Take 1 tablet (50 mg total) by mouth daily.  90 tablet  3  . beclomethasone (QVAR) 80 MCG/ACT inhaler Inhale 2 puffs into the lungs 2 (two) times daily. As needed      . fexofenadine (ALLEGRA) 180 MG tablet Take 180 mg by mouth daily.        . fluticasone (FLONASE) 50 MCG/ACT nasal spray Place 2 sprays into the nose daily.        Marland Kitchen. HYDROcodone-homatropine (HYCODAN) 5-1.5 MG/5ML syrup 1/2-1 teaspoon 3 times daily when necessary for cough and cold  120 mL  1  . metFORMIN (GLUCOPHAGE) 1000 MG tablet TAKE 1 TABLET BY MOUTH TWICE DAILY WITH A MEAL  180 tablet  2  . montelukast  (SINGULAIR) 10 MG tablet Take 10 mg by mouth at bedtime.        . nitrofurantoin, macrocrystal-monohydrate, (MACROBID) 100 MG capsule TAKE AS DIRECTED, PRN      . omeprazole (PRILOSEC) 20 MG capsule TAKE 2 CAPSULES BY MOUTH DAILY  180 capsule  2  . rosuvastatin (CRESTOR) 5 MG tablet TAKE 1 TABLET BY MOUTH DAILY  90 tablet  3  . sitaGLIPtin (JANUVIA) 100 MG tablet Take 1 tablet (100 mg total) by mouth daily.  30 tablet  4  . promethazine (PHENERGAN) 12.5 MG tablet Take 1 tablet (12.5 mg total) by mouth every 8 (eight) hours as needed for nausea.  20 tablet  0   No current facility-administered medications for this visit.    BP 133/59  Pulse 77  Ht 5\' 4"  (1.626 m)  Wt 92.534 kg (204 lb)  BMI 35.00 kg/m2 General: NAD, overweight Neck: No JVD, no thyromegaly or thyroid nodule.  Lungs: Clear to auscultation bilaterally with normal respiratory effort. CV: Nondisplaced PMI.  Heart regular S1/S2, no S3/S4, no murmur.  No peripheral edema.  No carotid bruit.  Normal pedal pulses.  Abdomen: Soft, nontender, no hepatosplenomegaly, no distention.  Neurologic: Alert and oriented x 3.  Psych: Normal affect. Extremities: No clubbing or cyanosis.  Assessment/Plan: 1. Palpitations: History of PACs.  These seem to have resolved with use of atenolol.  Her mother had atrial fibrillation so probably at a bit higher risk for this.  2. CAD risk: Strong family history of premature CAD as well as history of DM and hyperlipidemia.  No ischemic symptoms.  Continue statin and ASA 81.  Continue regular exercise and efforts at weight loss.  3. Hyperlipidemia: Excellent lipid profile in 12/14.  Given family history and diabetes, would continue statin long-term.   She can followup in 1 year.   Marca Ancona 03/18/2013

## 2013-04-25 ENCOUNTER — Ambulatory Visit: Payer: BC Managed Care – PPO | Admitting: Internal Medicine

## 2013-05-02 ENCOUNTER — Encounter: Payer: Self-pay | Admitting: Internal Medicine

## 2013-05-02 ENCOUNTER — Ambulatory Visit (INDEPENDENT_AMBULATORY_CARE_PROVIDER_SITE_OTHER): Payer: BC Managed Care – PPO | Admitting: Internal Medicine

## 2013-05-02 VITALS — BP 122/75 | HR 71 | Temp 97.9°F | Wt 204.0 lb

## 2013-05-02 DIAGNOSIS — J45909 Unspecified asthma, uncomplicated: Secondary | ICD-10-CM

## 2013-05-02 DIAGNOSIS — R7309 Other abnormal glucose: Secondary | ICD-10-CM

## 2013-05-02 DIAGNOSIS — J069 Acute upper respiratory infection, unspecified: Secondary | ICD-10-CM

## 2013-05-02 LAB — HEMOGLOBIN A1C
HEMOGLOBIN A1C: 6.6 % — AB (ref <5.7)
Mean Plasma Glucose: 143 mg/dL — ABNORMAL HIGH (ref <117)

## 2013-05-02 MED ORDER — DOXYCYCLINE HYCLATE 100 MG PO TABS: 100.0000 mg | ORAL_TABLET | Freq: Two times a day (BID) | ORAL | Status: DC

## 2013-05-02 MED ORDER — AZELASTINE HCL 0.1 % NA SOLN: 2.0000 | Freq: Two times a day (BID) | NASAL | Status: DC

## 2013-05-02 NOTE — Patient Instructions (Signed)
Get your blood work before you leave   Next visit is for routine check up regards your blood sugar  in 3-4 months  No need to come back fasting Please make an appointment    Rest, fluids , tylenol For cough, take Mucinex DM twice a day as needed  For congestion use ASTELIN 2 nasal sprays on each side of the nose daily until you feel better Take the antibiotic as prescribed  (doxycycline) if no better in 3-4 days  Call if no better in few days Call anytime if the symptoms are severe

## 2013-05-02 NOTE — Progress Notes (Signed)
Subjective:    Patient ID: Karen Barnes, female    DOB: 07-05-1960, 53 y.o.   MRN: [id number]  DOS:  05/02/2013 Type of  visit: ROV Diabetes, started Januvia, + compliance without apparent side effects. She did experience low blood sugars, lowest it has been is 60 and she felt fatigue. Usually happens if she skips a meal. Otherwise sugars are between 90, 120. Today CBG was 193, patient is sick, see next  4 days history of nasal congestion, postnasal dripping, low-grade fever. Started with cough today w/ a small amount of sputum production. Has a history of asthma, has not needed her rescue inhaler but once this week.  Asthma, good compliance with her routine meds.  ROS Diet has improved, doing Weight Watchers, lost 6 pounds. Unable to exercise lately due to to an Achilles injury Denies nausea, vomiting, diarrhea. No visual disturbances.   Past Medical History  Diagnosis Date  . Palpitations     PACs sees cards   . Diabetes mellitus     A1C 6.2---->  2009  . Asthma     sees Dr Beatriz StallionBardellas  . GERD (gastroesophageal reflux disease)   . Hot flashes   . Hyperlipidemia   . Normal nuclear stress test 2006  . Fibrocystic breast   . Recurrent cystitis     took  postcoital antibiotic for years, symptoms better after her hysterectomy 1996, symptoms re-surface in 2011    Past Surgical History  Procedure Laterality Date  . Cataract extraction Bilateral 2011  . Cesarean section    . Abdominal hysterectomy      due to endometriosis and fibroids approximately 1996    History   Social History  . Marital Status: Married    Spouse Name: N/A    Number of Children: 1  . Years of Education: N/A   Occupational History  . retired principal    Social History Main Topics  . Smoking status: Never Smoker   . Smokeless tobacco: Never Used  . Alcohol Use: No  . Drug Use: No  . Sexual Activity: Yes   Other Topics Concern  . Not on file   Social History Narrative  . No  narrative on file        Medication List       This list is accurate as of: 05/02/13 11:59 PM.  Always use your most recent med list.               albuterol 108 (90 BASE) MCG/ACT inhaler  Commonly known as:  PROVENTIL HFA;VENTOLIN HFA  Inhale 2 puffs into the lungs every 6 (six) hours as needed.     aspirin 81 MG tablet  Take 81 mg by mouth daily.     atenolol 50 MG tablet  Commonly known as:  TENORMIN  Take 1 tablet (50 mg total) by mouth daily.     azelastine 137 MCG/SPRAY nasal spray  Commonly known as:  ASTELIN  Place 2 sprays into both nostrils 2 (two) times daily.     beclomethasone 80 MCG/ACT inhaler  Commonly known as:  QVAR  Inhale 2 puffs into the lungs 2 (two) times daily. As needed     doxycycline 100 MG tablet  Commonly known as:  VIBRA-TABS  Take 1 tablet (100 mg total) by mouth 2 (two) times daily.     fexofenadine 180 MG tablet  Commonly known as:  ALLEGRA  Take 180 mg by mouth daily.     fluticasone 50 MCG/ACT nasal  spray  Commonly known as:  FLONASE  Place 2 sprays into the nose daily.     HYDROcodone-homatropine 5-1.5 MG/5ML syrup  Commonly known as:  HYCODAN  1/2-1 teaspoon 3 times daily when necessary for cough and cold     metFORMIN 1000 MG tablet  Commonly known as:  GLUCOPHAGE  TAKE 1 TABLET BY MOUTH TWICE DAILY WITH A MEAL     montelukast 10 MG tablet  Commonly known as:  SINGULAIR  Take 10 mg by mouth at bedtime.     nitrofurantoin (macrocrystal-monohydrate) 100 MG capsule  Commonly known as:  MACROBID  TAKE AS DIRECTED, PRN     omeprazole 20 MG capsule  Commonly known as:  PRILOSEC  TAKE 2 CAPSULES BY MOUTH DAILY     promethazine 12.5 MG tablet  Commonly known as:  PHENERGAN  Take 1 tablet (12.5 mg total) by mouth every 8 (eight) hours as needed for nausea.     rosuvastatin 5 MG tablet  Commonly known as:  CRESTOR  TAKE 1 TABLET BY MOUTH DAILY     sitaGLIPtin 100 MG tablet  Commonly known as:  JANUVIA  Take 1 tablet  (100 mg total) by mouth daily.           Objective:   Physical Exam BP 122/75  Pulse 71  Temp(Src) 97.9 F (36.6 C)  Wt 204 lb (92.534 kg)  SpO2 97% General -- alert, well-developed, NAD.  HEENT-- Not pale. TMs slt bulge, no red; throat symmetric, no redness or discharge. Face symmetric, sinuses not tender to palpation. Nose quite  congested. Lungs -- normal respiratory effort, no intercostal retractions, no accessory muscle use, and normal breath sounds.  Heart-- normal rate, regular rhythm, no murmur.  Neurologic--  alert & oriented X3. Speech normal, gait normal, strength normal in all extremities.   Psych-- Cognition and judgment appear intact. Cooperative with normal attention span and concentration. No anxious or depressed appearing.      Assessment & Plan:    URI, See instructions

## 2013-05-02 NOTE — Assessment & Plan Note (Signed)
Started Januvia few months ago based on last A1c, good compliance and tolerance. Sometimes CBG drops to the 60s. Doubt she will get severe hypoglycemia on current meds. Reports an eye exam recently, normal. Plan: Continue current meds, labs

## 2013-05-02 NOTE — Assessment & Plan Note (Signed)
Well-controlled on current medications, fortunately no asthma exacerbation with URI this week

## 2013-05-03 LAB — ALT: ALT: 22 U/L (ref 0–35)

## 2013-05-03 LAB — AST: AST: 22 U/L (ref 0–37)

## 2013-06-14 ENCOUNTER — Other Ambulatory Visit: Payer: Self-pay | Admitting: Internal Medicine

## 2013-06-16 NOTE — Telephone Encounter (Signed)
Rx sent to the pharmacy by e-script.//AB/CMA 

## 2013-07-15 ENCOUNTER — Ambulatory Visit: Payer: BC Managed Care – PPO | Admitting: Internal Medicine

## 2013-08-15 ENCOUNTER — Ambulatory Visit: Payer: BC Managed Care – PPO | Admitting: Internal Medicine

## 2013-08-22 ENCOUNTER — Encounter: Payer: Self-pay | Admitting: *Deleted

## 2013-08-22 ENCOUNTER — Ambulatory Visit (INDEPENDENT_AMBULATORY_CARE_PROVIDER_SITE_OTHER): Payer: BC Managed Care – PPO | Admitting: Internal Medicine

## 2013-08-22 ENCOUNTER — Encounter: Payer: Self-pay | Admitting: Internal Medicine

## 2013-08-22 VITALS — BP 138/75 | HR 100 | Temp 97.8°F | Wt 205.0 lb

## 2013-08-22 DIAGNOSIS — R7309 Other abnormal glucose: Secondary | ICD-10-CM

## 2013-08-22 DIAGNOSIS — J45909 Unspecified asthma, uncomplicated: Secondary | ICD-10-CM

## 2013-08-22 DIAGNOSIS — R030 Elevated blood-pressure reading, without diagnosis of hypertension: Secondary | ICD-10-CM

## 2013-08-22 DIAGNOSIS — E785 Hyperlipidemia, unspecified: Secondary | ICD-10-CM

## 2013-08-22 MED ORDER — ACCU-CHEK ADVANTAGE DIABETES KIT: PACK | Status: DC

## 2013-08-22 NOTE — Progress Notes (Signed)
Pre visit review using our clinic review tool, if applicable. No additional management support is needed unless otherwise documented below in the visit note. 

## 2013-08-22 NOTE — Progress Notes (Signed)
 Subjective:    Patient ID: Karen Barnes, female    DOB: 02/09/1960, 52 y.o.   MRN: [id number]  DOS:  08/22/2013 Type of visit - description: routine History: Diabetes, good medication compliance, ambulatory CBGs never more than 120, very rarely they get low usually if she skips a meal Elevated BP, checks her BP frequently at the [facility], usually 120/70, pulse isslightly high, in the ambulatory setting is in the 80s Asthma, good compliance with medications, will travel  to [place] and needs a letter to be able to get her supplies on board   ROS Denies chest pain or difficulty breathing No nausea, vomiting, diarrhea. No paresthesias in the lower extremities or visual deficits No anxiety-depression     Past Medical History  Diagnosis Date  . Palpitations     PACs sees cards   . Diabetes mellitus     A1C 6.2---->  2009  . Asthma     sees Dr Bardellas  . GERD (gastroesophageal reflux disease)   . Hot flashes   . Hyperlipidemia   . Normal nuclear stress test 2006  . Fibrocystic breast   . Recurrent cystitis     took  postcoital antibiotic for years, symptoms better after her hysterectomy 1996, symptoms re-surface in 2011    Past Surgical History  Procedure Laterality Date  . Cataract extraction Bilateral 2011  . Cesarean section    . Abdominal hysterectomy      due to endometriosis and fibroids approximately 1996    History   Social History  . Marital Status: Married    Spouse Name: N/A    Number of Children: 1  . Years of Education: N/A   Occupational History  . retired principal    Social History Main Topics  . Smoking status: Never Smoker   . Smokeless tobacco: Never Used  . Alcohol Use: No  . Drug Use: No  . Sexual Activity: Yes   Other Topics Concern  . Not on file   Social History Narrative  . No narrative on file        Medication List       This list is accurate as of: 08/22/13 11:59 PM.  Always use your most recent med list.               ACCU-CHEK ADVANTAGE DIABETES kit  Diabetes testing supply brand of choice     albuterol 108 (90 BASE) MCG/ACT inhaler  Commonly known as:  PROVENTIL HFA;VENTOLIN HFA  Inhale 2 puffs into the lungs every 6 (six) hours as needed.     aspirin 81 MG tablet  Take 81 mg by mouth daily.     atenolol 50 MG tablet  Commonly known as:  TENORMIN  Take 1 tablet (50 mg total) by mouth daily.     azelastine 0.1 % nasal spray  Commonly known as:  ASTELIN  Place 2 sprays into both nostrils 2 (two) times daily.     beclomethasone 80 MCG/ACT inhaler  Commonly known as:  QVAR  Inhale 2 puffs into the lungs 2 (two) times daily. As needed     bifidobacterium infantis capsule  Take 1 capsule by mouth daily.     fexofenadine 180 MG tablet  Commonly known as:  ALLEGRA  Take 180 mg by mouth daily.     fluticasone 50 MCG/ACT nasal spray  Commonly known as:  FLONASE  Place 2 sprays into the nose daily.     HYDROcodone-homatropine 5-1.5 MG/5ML syrup    Commonly known as:  HYCODAN  1/2-1 teaspoon 3 times daily when necessary for cough and cold     JANUVIA 100 MG tablet  Generic drug:  sitaGLIPtin  TAKE 1 TABLET BY MOUTH DAILY     metFORMIN 1000 MG tablet  Commonly known as:  GLUCOPHAGE  TAKE 1 TABLET BY MOUTH TWICE DAILY WITH A MEAL     montelukast 10 MG tablet  Commonly known as:  SINGULAIR  Take 10 mg by mouth at bedtime.     nitrofurantoin (macrocrystal-monohydrate) 100 MG capsule  Commonly known as:  MACROBID  TAKE AS DIRECTED, PRN     omeprazole 20 MG capsule  Commonly known as:  PRILOSEC  TAKE 2 CAPSULES BY MOUTH DAILY     promethazine 12.5 MG tablet  Commonly known as:  PHENERGAN  Take 1 tablet (12.5 mg total) by mouth every 8 (eight) hours as needed for nausea.     rosuvastatin 5 MG tablet  Commonly known as:  CRESTOR  TAKE 1 TABLET BY MOUTH DAILY           Objective:   Physical Exam BP 138/75  Pulse 100  Temp(Src) 97.8 F (36.6 C)  Wt 205 lb (92.987 kg)   SpO2 95%  General -- alert, well-developed, NAD.   Lungs -- normal respiratory effort, no intercostal retractions, no accessory muscle use, and normal breath sounds.  Heart-- normal rate, regular rhythm, no murmur.   DIABETIC FEET EXAM: No lower extremity edema Normal pedal pulses bilaterally Skin normal, nails normal, no calluses Pinprick examination of the feet normal. Neurologic--  alert & oriented X3.   Psych-- Cognition and judgment appear intact. Cooperative with normal attention span and concentration. No anxious or depressed appearing.       Assessment & Plan:

## 2013-08-22 NOTE — Patient Instructions (Signed)
Get your blood work before you leave   Next visit is for a physical exam by 01-2014, fasting Please make an appointment    

## 2013-08-22 NOTE — Assessment & Plan Note (Signed)
Well-controlled, good compliance and tolerance medications, labs on return to the office

## 2013-08-22 NOTE — Assessment & Plan Note (Addendum)
Eye check (-) ~ 01-2013  per pt  Feet exam negative today Continue present care, encourage diet and exercise, labs

## 2013-08-22 NOTE — Assessment & Plan Note (Signed)
Reports very good ambulatory BPs and pulse rate [facility].

## 2013-08-22 NOTE — Assessment & Plan Note (Signed)
Well-controlled, needs a letter for her upcoming trip, letter provided

## 2013-08-23 LAB — HEMOGLOBIN A1C
Hgb A1c MFr Bld: 6.2 % — ABNORMAL HIGH (ref <5.7)
Mean Plasma Glucose: 131 mg/dL — ABNORMAL HIGH (ref <117)

## 2013-08-23 LAB — BASIC METABOLIC PANEL
BUN: 17 mg/dL (ref 6–23)
CO2: 27 mEq/L (ref 19–32)
Calcium: 9.5 mg/dL (ref 8.4–10.5)
Chloride: 102 mEq/L (ref 96–112)
Creat: 0.79 mg/dL (ref 0.50–1.10)
GLUCOSE: 168 mg/dL — AB (ref 70–99)
POTASSIUM: 3.9 meq/L (ref 3.5–5.3)
Sodium: 138 mEq/L (ref 135–145)

## 2013-09-16 ENCOUNTER — Telehealth: Payer: Self-pay | Admitting: Internal Medicine

## 2013-09-16 NOTE — Telephone Encounter (Signed)
Caller name: Kathee PoliteBeverly Seipp  Relation to ZO:XWRUpt:Self Call back number: [phone] Pharmacy:[facility] [phone]   Reason for call: pt is requesting a script for omeprazole (PRILOSEC) 20 MG capsule the cost is cheaper if received as a script rather then buying it over the counter.

## 2013-09-17 MED ORDER — OMEPRAZOLE 20 MG PO CPDR: DELAYED_RELEASE_CAPSULE | ORAL | Status: DC

## 2013-09-17 NOTE — Telephone Encounter (Signed)
rx sent

## 2013-09-17 NOTE — Telephone Encounter (Signed)
Caller name: Meriam SpragueBeverly Relation to pt: Call back [phone] Pharmacy:  Reason for call: Meriam SpragueBeverly called back to check on getting the prescription for omeprazole (Prilosec) 20 mg since it is a lot cheaper for her to have the prescription oppose to over the counter.

## 2013-09-22 ENCOUNTER — Telehealth: Payer: Self-pay

## 2013-09-22 NOTE — Telephone Encounter (Signed)
Caller name:Jaritza Relation to pt: Call back number:[phone] Pharmacy:[facility] [address]  Reason for call: Meriam SpragueBeverly called and said that her insurance company requires a prior authorization on omeprazole (PRILOSEC) 20 MG capsule. The pharmacy said that had sent us several request for this.

## 2013-09-23 ENCOUNTER — Telehealth: Payer: Self-pay | Admitting: *Deleted

## 2013-09-23 NOTE — Telephone Encounter (Signed)
Prior Authorization started - omeprazole 20 mg .  Reference number [id number] .

## 2013-09-26 NOTE — Telephone Encounter (Signed)
Caller name: Meriam SpragueBeverly  Relation to pt: self  Call back number: [phone] [facility] [phone]   Reason for call: prior-authorization for omeprazole (prilosec) 20 mg capsule. Pt is requesting a phone call she is extremely upset and would like to hear from someone today

## 2013-09-26 NOTE — Telephone Encounter (Signed)
Received Denial letter for Omeprazole 20mg  from [facility].  Placed in folder for Dr. Drue NovelPaz to review and give recommendation.//AB/CMA

## 2013-09-30 ENCOUNTER — Telehealth: Payer: Self-pay | Admitting: Internal Medicine

## 2013-09-30 NOTE — Telephone Encounter (Signed)
Caller name: Twyla  Relation to pt: self  Call back number: [phone] [facility] [phone]   Reason for call:   please reference 09/26/13 telephone note. Pt spoke with insurance company 8/21 and her insurance informed her "Acid reflux was classfied as moderate" therefore she was denied instead of classifying as being severe. Pt would like doctor to appeal   [employer]  [phone] Benefits and Claims will transfer to prio-auth

## 2013-09-30 NOTE — Telephone Encounter (Signed)
1. Please check with Angie 2. At the same time call the pharmacy and asked for a new PA form

## 2013-09-30 NOTE — Telephone Encounter (Signed)
Spoke with insurance company to appeal . Representative states that we would have to send prescribers information on why pt needs to be on omeprazole to this address-   Pharmacy Appeal Coordinator  [address] [address]  [place] , [place] [address]  ATTN :appeall and grievance.    With pts information - case number [id number], member ID number, pts name / dob , prescriber info.   Asked if they have a phone number or fax number , representative states they do not have one must be mailed.Marland KitchenMarland Kitchen

## 2013-09-30 NOTE — Telephone Encounter (Signed)
Please see note.

## 2013-09-30 NOTE — Telephone Encounter (Signed)
[name]  [phone]  Karen Barnes really would like someone from here to call her back. She stated in the 11 days that she has been trying to get this no one is calling her back.  Karen Barnes received letter in the mail today from [facility] with this number [phone], she has talked to [facility] and they said we would have to do a new prior authrozation for a new case and we need to put that she has been on this medicine for at least 10 years hiatal hernia was found 2013 when Dr Drue Novel requested an endoscopy by Dr Ewing Schlein. She was placed onomeprazole (PRILOSEC) 20 MG capsule after trying Nexum and name brand Prilosec and then the Generic onomprazole after being diagnosed with Asthma. She has  been taking the over counter one for the last 10 days since she is out of RX and she is having the chest pain and diarrhea with sevre cramps from it.

## 2013-09-30 NOTE — Telephone Encounter (Signed)
I have not seen a PA form this Pt come through unless Angie has it.    Please Advise.

## 2013-10-01 NOTE — Telephone Encounter (Signed)
We sent another form this AM per angie

## 2013-10-01 NOTE — Telephone Encounter (Signed)
We sent another form this morning per [facility]

## 2013-10-16 NOTE — Telephone Encounter (Signed)
Received denial letter for the Omeprazole from [employer].   [employer] are requesting a letter from the prescriber on why the pt needs to be taking the Omeprazole twice a day.  The letter needs to go to Pharmacy Appeal Coordinator.  The address and the information is in the note below.//AB/CMA

## 2013-10-17 ENCOUNTER — Other Ambulatory Visit: Payer: Self-pay | Admitting: Internal Medicine

## 2013-10-17 NOTE — Telephone Encounter (Signed)
Send a letter:  The patient is currently taking Nexium OTC twice a day with poor symptom control, having to use TUMS as needed. In my opinion she needs to try something different, I recommend:  Omeprazole 20 mg one by mouth twice a day Or  Omeprazole 40 mg one before breakfast Please notify  the patient and me about your decision

## 2013-10-17 NOTE — Telephone Encounter (Signed)
Letter printed and mailed to:  Pharmacy Appeal Coordinator [address] [address] [place], [place] [address] ATTN: Appeal and Grievance  Awaiting response.

## 2013-11-06 ENCOUNTER — Other Ambulatory Visit: Payer: Self-pay | Admitting: *Deleted

## 2013-11-06 DIAGNOSIS — K219 Gastro-esophageal reflux disease without esophagitis: Secondary | ICD-10-CM

## 2013-11-06 MED ORDER — RANITIDINE HCL 300 MG PO TABS: 300.0000 mg | ORAL_TABLET | Freq: Every day | ORAL | Status: DC

## 2013-11-06 NOTE — Telephone Encounter (Signed)
Called and spoke with patient. She stated that she has been trying Nexium OTC and has some relief. She would like to try Zantac. Script was sent to patient's pharmacy. Pt was instructed to please give our office a call back if Zantac does not work for her. Pt verbalized understanding. JG//CMA

## 2013-11-06 NOTE — Telephone Encounter (Signed)
Zantac sent to patient's pharmacy

## 2013-11-06 NOTE — Telephone Encounter (Signed)
Is up to the patient,  she could pay for omeprazole out of   pocket or  try Zantac 300 mg one by mouth each bedtime #30 and 6 refills.

## 2013-11-06 NOTE — Telephone Encounter (Signed)
Received letter from [employer] insurance company stating that they are going to uphold previous decision to deny approval for Omeprazole even though a letter of medical necessity was sent in by Dr. Drue NovelPaz. Letter states patient must try a course of high dose H2 receptor antagonist. Please advise. JG//CMA

## 2013-11-10 NOTE — Telephone Encounter (Signed)
See telephone encounter for (11/06/13).//AB/CMA 

## 2013-11-10 NOTE — Telephone Encounter (Signed)
See telephone encounter for (09/30/13).//AB/CMA

## 2013-11-23 ENCOUNTER — Encounter: Payer: Self-pay | Admitting: Internal Medicine

## 2013-11-24 ENCOUNTER — Other Ambulatory Visit: Payer: Self-pay

## 2013-11-24 MED ORDER — OMEPRAZOLE 40 MG PO CPDR: 40.0000 mg | DELAYED_RELEASE_CAPSULE | Freq: Every day | ORAL | Status: DC

## 2013-12-03 NOTE — Telephone Encounter (Signed)
Pt states the insurance is still not accepting this medication approval.  Can you contact pt.   [phone]

## 2013-12-03 NOTE — Telephone Encounter (Signed)
A user error has taken place.

## 2013-12-11 ENCOUNTER — Other Ambulatory Visit: Payer: Self-pay | Admitting: Internal Medicine

## 2013-12-24 ENCOUNTER — Telehealth: Payer: Self-pay | Admitting: *Deleted

## 2013-12-24 ENCOUNTER — Encounter: Payer: Self-pay | Admitting: *Deleted

## 2013-12-24 NOTE — Telephone Encounter (Signed)
PA for omeprazole approved effective 11/23/2013 through 12/23/2014. JG//CMA

## 2014-01-07 ENCOUNTER — Other Ambulatory Visit: Payer: Self-pay | Admitting: Obstetrics and Gynecology

## 2014-01-07 DIAGNOSIS — R928 Other abnormal and inconclusive findings on diagnostic imaging of breast: Secondary | ICD-10-CM

## 2014-01-13 ENCOUNTER — Encounter: Payer: BC Managed Care – PPO | Admitting: Internal Medicine

## 2014-01-21 ENCOUNTER — Ambulatory Visit
Admission: RE | Admit: 2014-01-21 | Discharge: 2014-01-21 | Disposition: A | Discharge status: Home / Self Care | Payer: BC Managed Care – PPO | Source: Ambulatory Visit | Attending: Obstetrics and Gynecology | Admitting: Obstetrics and Gynecology

## 2014-01-21 ENCOUNTER — Other Ambulatory Visit: Payer: Self-pay | Admitting: Obstetrics and Gynecology

## 2014-01-21 DIAGNOSIS — R928 Other abnormal and inconclusive findings on diagnostic imaging of breast: Secondary | ICD-10-CM

## 2014-02-02 ENCOUNTER — Other Ambulatory Visit: Payer: Self-pay | Admitting: Obstetrics and Gynecology

## 2014-02-02 DIAGNOSIS — R928 Other abnormal and inconclusive findings on diagnostic imaging of breast: Secondary | ICD-10-CM

## 2014-02-04 ENCOUNTER — Other Ambulatory Visit: Payer: Self-pay | Admitting: Obstetrics and Gynecology

## 2014-02-04 ENCOUNTER — Ambulatory Visit
Admission: RE | Admit: 2014-02-04 | Discharge: 2014-02-04 | Disposition: A | Discharge status: Home / Self Care | Payer: BC Managed Care – PPO | Source: Ambulatory Visit | Attending: Obstetrics and Gynecology | Admitting: Obstetrics and Gynecology

## 2014-02-04 DIAGNOSIS — N6489 Other specified disorders of breast: Secondary | ICD-10-CM

## 2014-02-04 DIAGNOSIS — R928 Other abnormal and inconclusive findings on diagnostic imaging of breast: Secondary | ICD-10-CM

## 2014-02-06 DIAGNOSIS — R1011 Right upper quadrant pain: Secondary | ICD-10-CM

## 2014-02-06 HISTORY — DX: Right upper quadrant pain: R10.11

## 2014-02-12 ENCOUNTER — Other Ambulatory Visit: Payer: Self-pay | Admitting: Obstetrics and Gynecology

## 2014-02-12 ENCOUNTER — Ambulatory Visit
Admission: RE | Admit: 2014-02-12 | Discharge: 2014-02-12 | Disposition: A | Discharge status: Home / Self Care | Payer: BC Managed Care – PPO | Source: Ambulatory Visit | Attending: Obstetrics and Gynecology | Admitting: Obstetrics and Gynecology

## 2014-02-12 DIAGNOSIS — N6489 Other specified disorders of breast: Secondary | ICD-10-CM

## 2014-02-16 ENCOUNTER — Ambulatory Visit (INDEPENDENT_AMBULATORY_CARE_PROVIDER_SITE_OTHER): Payer: Self-pay | Admitting: Surgery

## 2014-02-16 DIAGNOSIS — N6310 Unspecified lump in the right breast, unspecified quadrant: Secondary | ICD-10-CM

## 2014-02-16 NOTE — H&P (Signed)
Karen Barnes 02/16/2014 1:46 PM Location: [facility] Patient #: [id number] DOB: 07/05/1960 Married / Language: Undefined / Race: Undefined Female History of Present Illness (Kemiah Booz A. Onedia Vargus MD; 02/16/2014 2:20 PM) Patient words: right breast lesion  pt sent at the request of dr drew davis for right breast mass core biopsied to be sclerosing lesion. pt is bruised. family history of breast cancer.    Diagnosis Breast, right, needle core biopsy, 9:30 - COMPLEX SCLEROSING LESION WITH CALCIFICATIONS. - SEE COMMENT. Microscopic Comment The differential diagnosis includes a radial scar. The results were called to [facility] on 02/13/14. (JBK:gt, 02/13/14) JOSHUA KISH MD ADDENDUM: Patient returned today for planned ultrasound-guided core needle biopsy of the small lesions seen sonographically felt to be a correlate to the area of architectural distortion seen in the right breast on mammography. This lesion could not be confidently reproduced. No discrete suspicious lesion was seen on the pre biopsy ultrasound. For this reason, the ultrasound-guided core needle biopsy was canceled.  Patient will be scheduled for a problem-solving breast MRI for further evaluation of the right breast architectural distortion. If an enhancing lesion correlating with the architectural distortion in the right breast can be detected, this could then be targeted for MRI guided biopsy.   Electronically Signed By: David Ormond M.D. On: 02/04/2014 09:15.  The patient is a 53 year old female   Other Problems (Sonya Bynum, CMA; 02/16/2014 1:47 PM) Asthma Diabetes Mellitus Gastroesophageal Reflux Disease Kidney Stone Lump In Breast Other disease, cancer, significant illness  Past Surgical History (Sonya Bynum, CMA; 02/16/2014 1:47 PM) Breast Biopsy Right. Cataract Surgery Bilateral. Cesarean Section - 1 Hysterectomy (not due to cancer) - Partial Oral  Surgery  Diagnostic Studies History (Sonya Bynum, CMA; 02/16/2014 1:47 PM) Colonoscopy 1-5 years ago Mammogram within last year Pap Smear 1-5 years ago  Allergies (Sonya Bynum, CMA; 02/16/2014 1:48 PM) Penicillamine *ASSORTED CLASSES*  Medication History (Sonya Bynum, CMA; 02/16/2014 1:49 PM) Atenolol (50MG Tablet, Oral) Active. Crestor (5MG Tablet, Oral) Active. Fluconazole (150MG Tablet, Oral) Active. Fluticasone Propionate (50MCG/ACT Suspension, Nasal) Active. MetFORMIN HCl (1000MG Tablet, Oral) Active. Januvia (100MG Tablet, Oral) Active. Montelukast Sodium (10MG Tablet, Oral) Active. Omeprazole (40MG Capsule DR, Oral) Active. Qvar (80MCG/ACT Aerosol Soln, Inhalation) Active. Triamcinolone Acetonide (0.1% Ointment, External as needed) Active.  Social History (Sonya Bynum, CMA; 02/16/2014 1:47 PM) Caffeine use Coffee. No alcohol use No drug use Tobacco use Never smoker.  Family History (Sonya Bynum, CMA; 02/16/2014 1:47 PM) Alcohol Abuse Father. Breast Cancer Family Members In General, Mother. Diabetes Mellitus Father. Heart Disease Father, Mother. Heart disease in female family member before age 65 Heart disease in female family member before age 55 Prostate Cancer Family Members In General.  Pregnancy / Birth History (Sonya Bynum, CMA; 02/16/2014 1:47 PM) Age at menarche 10 years. Age of menopause 51-55 Gravida 1 Maternal age 26-30 Para 1     Review of Systems (Sonya Bynum CMA; 02/16/2014 1:47 PM) General Present- Night Sweats. Not Present- Appetite Loss, Chills, Fatigue, Fever, Weight Gain and Weight Loss. Skin Not Present- Change in Wart/Mole, Dryness, Hives, Jaundice, New Lesions, Non-Healing Wounds, Rash and Ulcer. HEENT Present- Sinus Pain. Not Present- Earache, Hearing Loss, Hoarseness, Nose Bleed, Oral Ulcers, Ringing in the Ears, Seasonal Allergies, Sore Throat, Visual Disturbances, Wears glasses/contact lenses and Yellow  Eyes. Respiratory Not Present- Bloody sputum, Chronic Cough, Difficulty Breathing, Snoring and Wheezing. Breast Present- Breast Mass. Not Present- Breast Pain, Nipple Discharge and Skin Changes. Cardiovascular Not Present- Chest Pain,   Difficulty Breathing Lying Down, Leg Cramps, Palpitations, Rapid Heart Rate, Shortness of Breath and Swelling of Extremities. Gastrointestinal Not Present- Abdominal Pain, Bloating, Bloody Stool, Change in Bowel Habits, Chronic diarrhea, Constipation, Difficulty Swallowing, Excessive gas, Gets full quickly at meals, Hemorrhoids, Indigestion, Nausea, Rectal Pain and Vomiting. Female Genitourinary Not Present- Frequency, Nocturia, Painful Urination, Pelvic Pain and Urgency. Musculoskeletal Not Present- Back Pain, Joint Pain, Joint Stiffness, Muscle Pain, Muscle Weakness and Swelling of Extremities. Neurological Not Present- Decreased Memory, Fainting, Headaches, Numbness, Seizures, Tingling, Tremor, Trouble walking and Weakness. Psychiatric Not Present- Anxiety, Bipolar, Change in Sleep Pattern, Depression, Fearful and Frequent crying. Endocrine Present- Hot flashes. Not Present- Cold Intolerance, Excessive Hunger, Hair Changes, Heat Intolerance and New Diabetes. Hematology Present- Easy Bruising. Not Present- Excessive bleeding, Gland problems, HIV and Persistent Infections.  Vitals (Sonya Bynum CMA; 02/16/2014 1:48 PM) 02/16/2014 1:47 PM Weight: 201 lb Height: 64in Body Surface Area: 2.03 m Body Mass Index: 34.5 kg/m Temp.: 98F(Temporal)  Pulse: 80 (Regular)  BP: 136/82 (Sitting, Left Arm, Standard)     Physical Exam (Jessice Madill A. Marca Gadsby MD; 02/16/2014 2:17 PM)  General Mental Status-Alert. General Appearance-Consistent with stated age. Hydration-Well hydrated. Voice-Normal.  Head and Neck Head-normocephalic, atraumatic with no lesions or palpable masses. Trachea-midline. Thyroid Gland Characteristics - normal size and  consistency.  Eye Eyeball - Bilateral-Extraocular movements intact. Sclera/Conjunctiva - Bilateral-No scleral icterus.  Chest and Lung Exam Chest and lung exam reveals -quiet, even and easy respiratory effort with no use of accessory muscles and on auscultation, normal breath sounds, no adventitious sounds and normal vocal resonance. Inspection Chest Wall - Normal. Back - normal.  Breast Breast - Left-Symmetric, Non Tender, No Biopsy scars, no Dimpling, No Inflammation, No Lumpectomy scars, No Mastectomy scars, No Peau d' Orange. Breast - Right-Symmetric, Non Tender, No Biopsy scars, no Dimpling, No Inflammation, No Lumpectomy scars, No Mastectomy scars, No Peau d' Orange. Breast Lump-No Palpable Breast Mass. Note: bruising right breast.   Cardiovascular Cardiovascular examination reveals -normal heart sounds, regular rate and rhythm with no murmurs and normal pedal pulses bilaterally.  Abdomen Inspection Inspection of the abdomen reveals - No Hernias. Skin - Scar - no surgical scars. Palpation/Percussion Palpation and Percussion of the abdomen reveal - Soft, Non Tender, No Rebound tenderness, No Rigidity (guarding) and No hepatosplenomegaly. Auscultation Auscultation of the abdomen reveals - Bowel sounds normal.  Neurologic Neurologic evaluation reveals -alert and oriented x 3 with no impairment of recent or remote memory. Mental Status-Normal.  Musculoskeletal Normal Exam - Left-Upper Extremity Strength Normal and Lower Extremity Strength Normal. Normal Exam - Right-Upper Extremity Strength Normal and Lower Extremity Strength Normal.  Lymphatic Head & Neck  General Head & Neck Lymphatics: Bilateral - Description - Normal. Axillary  General Axillary Region: Bilateral - Description - Normal. Tenderness - Non Tender. Femoral & Inguinal  Generalized Femoral & Inguinal Lymphatics: Bilateral - Description - Normal. Tenderness - Non  Tender.    Assessment & Plan (Analilia Geddis A. Tynisa Vohs MD; 02/16/2014 2:20 PM)  BREAST MASS, RIGHT (611.72  N63) Impression: recommend seed localized right breast lumpectomy for small but finite risk of malignancy. Risk of lumpectomy include bleeding, infection, seroma, more surgery, use of seed/wire, wound care, cosmetic deformity and the need for other treatments, death , blood clots, death. Pt agrees to proceed.  Current Plans Pt Education - [facility] Breast Biopsy Instructions (FLB): discussed with patient and provided information. 

## 2014-02-17 ENCOUNTER — Other Ambulatory Visit: Payer: BC Managed Care – PPO

## 2014-02-23 ENCOUNTER — Other Ambulatory Visit: Payer: Self-pay | Admitting: Surgery

## 2014-02-23 DIAGNOSIS — N63 Unspecified lump in unspecified breast: Secondary | ICD-10-CM

## 2014-03-06 ENCOUNTER — Encounter (HOSPITAL_BASED_OUTPATIENT_CLINIC_OR_DEPARTMENT_OTHER): Payer: Self-pay | Admitting: *Deleted

## 2014-03-06 NOTE — Progress Notes (Signed)
To come in for labs and ekg after seeds 03/10/14-she has asthma controlled

## 2014-03-10 ENCOUNTER — Ambulatory Visit
Admission: RE | Admit: 2014-03-10 | Discharge: 2014-03-10 | Disposition: A | Discharge status: Home / Self Care | Payer: BC Managed Care – PPO | Source: Ambulatory Visit | Attending: Surgery | Admitting: Surgery

## 2014-03-10 ENCOUNTER — Encounter (HOSPITAL_BASED_OUTPATIENT_CLINIC_OR_DEPARTMENT_OTHER)
Admission: RE | Admit: 2014-03-10 | Discharge: 2014-03-10 | Disposition: A | Discharge status: Home / Self Care | Payer: BC Managed Care – PPO | Source: Ambulatory Visit | Attending: Surgery | Admitting: Surgery

## 2014-03-10 DIAGNOSIS — K219 Gastro-esophageal reflux disease without esophagitis: Secondary | ICD-10-CM | POA: Diagnosis not present

## 2014-03-10 DIAGNOSIS — E119 Type 2 diabetes mellitus without complications: Secondary | ICD-10-CM | POA: Diagnosis not present

## 2014-03-10 DIAGNOSIS — N63 Unspecified lump in unspecified breast: Secondary | ICD-10-CM

## 2014-03-10 DIAGNOSIS — Z859 Personal history of malignant neoplasm, unspecified: Secondary | ICD-10-CM | POA: Diagnosis not present

## 2014-03-10 DIAGNOSIS — Z803 Family history of malignant neoplasm of breast: Secondary | ICD-10-CM | POA: Diagnosis not present

## 2014-03-10 DIAGNOSIS — J45909 Unspecified asthma, uncomplicated: Secondary | ICD-10-CM | POA: Diagnosis not present

## 2014-03-10 DIAGNOSIS — Z9889 Other specified postprocedural states: Secondary | ICD-10-CM | POA: Diagnosis not present

## 2014-03-10 DIAGNOSIS — Z79899 Other long term (current) drug therapy: Secondary | ICD-10-CM | POA: Diagnosis not present

## 2014-03-10 DIAGNOSIS — N6021 Fibroadenosis of right breast: Secondary | ICD-10-CM | POA: Diagnosis not present

## 2014-03-10 LAB — COMPREHENSIVE METABOLIC PANEL
ALK PHOS: 64 U/L (ref 39–117)
ALT: 21 U/L (ref 0–35)
AST: 22 U/L (ref 0–37)
Albumin: 4.1 g/dL (ref 3.5–5.2)
Anion gap: 7 (ref 5–15)
BUN: 11 mg/dL (ref 6–23)
CALCIUM: 9.8 mg/dL (ref 8.4–10.5)
CO2: 31 mmol/L (ref 19–32)
Chloride: 102 mmol/L (ref 96–112)
Creatinine, Ser: 0.64 mg/dL (ref 0.50–1.10)
GFR calc non Af Amer: >90 mL/min (ref >90)
Glucose, Bld: 88 mg/dL (ref 70–99)
Potassium: 4.4 mmol/L (ref 3.5–5.1)
Sodium: 140 mmol/L (ref 135–145)
Total Bilirubin: 0.4 mg/dL (ref 0.3–1.2)
Total Protein: 6.9 g/dL (ref 6.0–8.3)

## 2014-03-10 LAB — CBC WITH DIFFERENTIAL/PLATELET
BASOS PCT: 0 % (ref 0–1)
Basophils Absolute: 0 10*3/uL (ref 0.0–0.1)
Eosinophils Absolute: 0.1 10*3/uL (ref 0.0–0.7)
Eosinophils Relative: 2 % (ref 0–5)
HEMATOCRIT: 39.7 % (ref 36.0–46.0)
HEMOGLOBIN: 12.8 g/dL (ref 12.0–15.0)
Lymphocytes Relative: 24 % (ref 12–46)
Lymphs Abs: 1.3 10*3/uL (ref 0.7–4.0)
MCH: 27.2 pg (ref 26.0–34.0)
MCHC: 32.2 g/dL (ref 30.0–36.0)
MCV: 84.3 fL (ref 78.0–100.0)
Monocytes Absolute: 0.5 10*3/uL (ref 0.1–1.0)
Monocytes Relative: 10 % (ref 3–12)
NEUTROS PCT: 64 % (ref 43–77)
Neutro Abs: 3.4 10*3/uL (ref 1.7–7.7)
Platelets: 260 10*3/uL (ref 150–400)
RBC: 4.71 MIL/uL (ref 3.87–5.11)
RDW: 13.1 % (ref 11.5–15.5)
WBC: 5.3 10*3/uL (ref 4.0–10.5)

## 2014-03-12 ENCOUNTER — Ambulatory Visit (HOSPITAL_BASED_OUTPATIENT_CLINIC_OR_DEPARTMENT_OTHER)
Admission: RE | Admit: 2014-03-12 | Discharge: 2014-03-12 | Disposition: A | Discharge status: Home / Self Care | Payer: BC Managed Care – PPO | Source: Ambulatory Visit | Attending: Surgery | Admitting: Surgery

## 2014-03-12 ENCOUNTER — Encounter (HOSPITAL_BASED_OUTPATIENT_CLINIC_OR_DEPARTMENT_OTHER)
Admission: RE | Disposition: A | Discharge status: Home / Self Care | Payer: Self-pay | Source: Ambulatory Visit | Attending: Surgery

## 2014-03-12 ENCOUNTER — Ambulatory Visit
Admission: RE | Admit: 2014-03-12 | Discharge: 2014-03-12 | Disposition: A | Discharge status: Home / Self Care | Payer: BC Managed Care – PPO | Source: Ambulatory Visit | Attending: Surgery | Admitting: Surgery

## 2014-03-12 ENCOUNTER — Encounter (HOSPITAL_BASED_OUTPATIENT_CLINIC_OR_DEPARTMENT_OTHER): Payer: Self-pay | Admitting: *Deleted

## 2014-03-12 ENCOUNTER — Ambulatory Visit (HOSPITAL_BASED_OUTPATIENT_CLINIC_OR_DEPARTMENT_OTHER): Payer: BC Managed Care – PPO | Admitting: Anesthesiology

## 2014-03-12 DIAGNOSIS — Z9889 Other specified postprocedural states: Secondary | ICD-10-CM | POA: Insufficient documentation

## 2014-03-12 DIAGNOSIS — Z803 Family history of malignant neoplasm of breast: Secondary | ICD-10-CM | POA: Insufficient documentation

## 2014-03-12 DIAGNOSIS — K219 Gastro-esophageal reflux disease without esophagitis: Secondary | ICD-10-CM | POA: Insufficient documentation

## 2014-03-12 DIAGNOSIS — E119 Type 2 diabetes mellitus without complications: Secondary | ICD-10-CM | POA: Insufficient documentation

## 2014-03-12 DIAGNOSIS — N63 Unspecified lump in unspecified breast: Secondary | ICD-10-CM

## 2014-03-12 DIAGNOSIS — J45909 Unspecified asthma, uncomplicated: Secondary | ICD-10-CM | POA: Insufficient documentation

## 2014-03-12 DIAGNOSIS — Z859 Personal history of malignant neoplasm, unspecified: Secondary | ICD-10-CM | POA: Insufficient documentation

## 2014-03-12 DIAGNOSIS — Z79899 Other long term (current) drug therapy: Secondary | ICD-10-CM | POA: Insufficient documentation

## 2014-03-12 DIAGNOSIS — N6021 Fibroadenosis of right breast: Secondary | ICD-10-CM | POA: Insufficient documentation

## 2014-03-12 HISTORY — DX: Family history of other specified conditions: Z84.89

## 2014-03-12 HISTORY — DX: Personal history of other diseases of the digestive system: Z87.19

## 2014-03-12 HISTORY — PX: BREAST LUMPECTOMY WITH RADIOACTIVE SEED LOCALIZATION: SHX6424

## 2014-03-12 HISTORY — PX: BREAST EXCISIONAL BIOPSY: SUR124

## 2014-03-12 HISTORY — DX: Presence of spectacles and contact lenses: Z97.3

## 2014-03-12 LAB — GLUCOSE, CAPILLARY
Glucose-Capillary: 168 mg/dL — ABNORMAL HIGH (ref 70–99)
Glucose-Capillary: 121 mg/dL — ABNORMAL HIGH (ref 70–99)

## 2014-03-12 SURGERY — BREAST LUMPECTOMY WITH RADIOACTIVE SEED LOCALIZATION
Anesthesia: General | Site: Breast | Laterality: Right

## 2014-03-12 MED ORDER — FENTANYL CITRATE 0.05 MG/ML IJ SOLN
INTRAMUSCULAR | Status: AC
  Filled 2014-03-12: qty 2

## 2014-03-12 MED ORDER — PROPOFOL 10 MG/ML IV BOLUS
INTRAVENOUS | Status: AC
  Filled 2014-03-12: qty 20

## 2014-03-12 MED ORDER — LACTATED RINGERS IV SOLN
INTRAVENOUS | Status: DC
  Administered 2014-03-12 (×2): via INTRAVENOUS

## 2014-03-12 MED ORDER — OXYCODONE HCL 5 MG PO TABS
ORAL_TABLET | ORAL | Status: AC
  Filled 2014-03-12: qty 1

## 2014-03-12 MED ORDER — MIDAZOLAM HCL 2 MG/2ML IJ SOLN
INTRAMUSCULAR | Status: AC
  Filled 2014-03-12: qty 2

## 2014-03-12 MED ORDER — OXYCODONE HCL 5 MG/5ML PO SOLN: 5.0000 mg | Freq: Once | ORAL | Status: AC | PRN

## 2014-03-12 MED ORDER — MIDAZOLAM HCL 5 MG/5ML IJ SOLN
INTRAMUSCULAR | Status: DC | PRN
  Administered 2014-03-12: 2 mg via INTRAVENOUS

## 2014-03-12 MED ORDER — FENTANYL CITRATE 0.05 MG/ML IJ SOLN
INTRAMUSCULAR | Status: AC
  Filled 2014-03-12: qty 4

## 2014-03-12 MED ORDER — OXYCODONE HCL 5 MG PO TABS
5.0000 mg | ORAL_TABLET | Freq: Once | ORAL | Status: AC | PRN
  Administered 2014-03-12: 5 mg via ORAL

## 2014-03-12 MED ORDER — FENTANYL CITRATE 0.05 MG/ML IJ SOLN
25.0000 ug | INTRAMUSCULAR | Status: DC | PRN
  Administered 2014-03-12: 25 ug via INTRAVENOUS
  Administered 2014-03-12: 50 ug via INTRAVENOUS

## 2014-03-12 MED ORDER — CIPROFLOXACIN IN D5W 400 MG/200ML IV SOLN
INTRAVENOUS | Status: AC
  Filled 2014-03-12: qty 200

## 2014-03-12 MED ORDER — PROPOFOL 10 MG/ML IV BOLUS
INTRAVENOUS | Status: DC | PRN
  Administered 2014-03-12: 200 mg via INTRAVENOUS

## 2014-03-12 MED ORDER — LIDOCAINE HCL (CARDIAC) 20 MG/ML IV SOLN
INTRAVENOUS | Status: DC | PRN
  Administered 2014-03-12: 50 mg via INTRAVENOUS

## 2014-03-12 MED ORDER — FENTANYL CITRATE 0.05 MG/ML IJ SOLN
INTRAMUSCULAR | Status: DC | PRN
  Administered 2014-03-12 (×2): 50 ug via INTRAVENOUS

## 2014-03-12 MED ORDER — FENTANYL CITRATE 0.05 MG/ML IJ SOLN: 50.0000 ug | INTRAMUSCULAR | Status: DC | PRN

## 2014-03-12 MED ORDER — ACETAMINOPHEN 325 MG PO TABS: 325.0000 mg | ORAL_TABLET | ORAL | Status: DC | PRN

## 2014-03-12 MED ORDER — CIPROFLOXACIN IN D5W 400 MG/200ML IV SOLN
400.0000 mg | INTRAVENOUS | Status: AC
  Administered 2014-03-12 (×2): 400 mg via INTRAVENOUS

## 2014-03-12 MED ORDER — ONDANSETRON HCL 4 MG/2ML IJ SOLN
INTRAMUSCULAR | Status: DC | PRN
  Administered 2014-03-12: 4 mg via INTRAVENOUS

## 2014-03-12 MED ORDER — ACETAMINOPHEN 160 MG/5ML PO SOLN: 325.0000 mg | ORAL | Status: DC | PRN

## 2014-03-12 MED ORDER — BUPIVACAINE-EPINEPHRINE (PF) 0.25% -1:200000 IJ SOLN
INTRAMUSCULAR | Status: AC
  Filled 2014-03-12: qty 30

## 2014-03-12 MED ORDER — CHLORHEXIDINE GLUCONATE 4 % EX LIQD: 1.0000 "application " | Freq: Once | CUTANEOUS | Status: DC

## 2014-03-12 MED ORDER — MIDAZOLAM HCL 2 MG/2ML IJ SOLN: 1.0000 mg | INTRAMUSCULAR | Status: DC | PRN

## 2014-03-12 MED ORDER — 0.9 % SODIUM CHLORIDE (POUR BTL) OPTIME
TOPICAL | Status: DC | PRN
  Administered 2014-03-12: 500 mL

## 2014-03-12 MED ORDER — BUPIVACAINE HCL (PF) 0.25 % IJ SOLN
INTRAMUSCULAR | Status: DC | PRN
  Administered 2014-03-12: 10 mL

## 2014-03-12 MED ORDER — OXYCODONE-ACETAMINOPHEN 5-325 MG PO TABS
1.0000 | ORAL_TABLET | ORAL | Status: DC | PRN
Start: 2014-03-12 — End: 2014-03-20

## 2014-03-12 SURGICAL SUPPLY — 52 items
APPLIER CLIP 9.375 MED OPEN (MISCELLANEOUS)
APR CLP MED 9.3 20 MLT OPN (MISCELLANEOUS)
BINDER BREAST LRG (GAUZE/BANDAGES/DRESSINGS) IMPLANT
BINDER BREAST MEDIUM (GAUZE/BANDAGES/DRESSINGS) IMPLANT
BINDER BREAST XLRG (GAUZE/BANDAGES/DRESSINGS) IMPLANT
BINDER BREAST XXLRG (GAUZE/BANDAGES/DRESSINGS) IMPLANT
BLADE SURG 15 STRL LF DISP TIS (BLADE) ×1 IMPLANT
BLADE SURG 15 STRL SS (BLADE) ×2
CANISTER SUC SOCK COL 7IN (MISCELLANEOUS) ×2 IMPLANT
CANISTER SUCT 1200ML W/VALVE (MISCELLANEOUS) IMPLANT
CHLORAPREP W/TINT 26ML (MISCELLANEOUS) ×2 IMPLANT
CLIP APPLIE 9.375 MED OPEN (MISCELLANEOUS) IMPLANT
CLIP TI WIDE RED SMALL 6 (CLIP) ×1 IMPLANT
COVER BACK TABLE 60X90IN (DRAPES) ×2 IMPLANT
COVER MAYO STAND STRL (DRAPES) ×2 IMPLANT
COVER PROBE W GEL 5X96 (DRAPES) ×2 IMPLANT
DECANTER SPIKE VIAL GLASS SM (MISCELLANEOUS) IMPLANT
DEVICE DUBIN W/COMP PLATE 8390 (MISCELLANEOUS) ×2 IMPLANT
DRAPE LAPAROSCOPIC ABDOMINAL (DRAPES) IMPLANT
DRAPE LAPAROTOMY 100X72 PEDS (DRAPES) ×2 IMPLANT
DRAPE UTILITY XL STRL (DRAPES) ×2 IMPLANT
ELECT COATED BLADE 2.86 ST (ELECTRODE) ×2 IMPLANT
ELECT REM PT RETURN 9FT ADLT (ELECTROSURGICAL) ×2
ELECTRODE REM PT RTRN 9FT ADLT (ELECTROSURGICAL) ×1 IMPLANT
GLOVE BIO SURGEON STRL SZ 6.5 (GLOVE) ×1 IMPLANT
GLOVE BIOGEL PI IND STRL 7.0 (GLOVE) IMPLANT
GLOVE BIOGEL PI IND STRL 8 (GLOVE) ×1 IMPLANT
GLOVE BIOGEL PI INDICATOR 7.0 (GLOVE) ×1
GLOVE BIOGEL PI INDICATOR 8 (GLOVE) ×1
GLOVE ECLIPSE 8.0 STRL XLNG CF (GLOVE) ×2 IMPLANT
GLOVE EXAM NITRILE LRG STRL (GLOVE) ×1 IMPLANT
GOWN STRL REUS W/ TWL LRG LVL3 (GOWN DISPOSABLE) ×2 IMPLANT
GOWN STRL REUS W/TWL LRG LVL3 (GOWN DISPOSABLE) ×4
HEMOSTAT SNOW SURGICEL 2X4 (HEMOSTASIS) IMPLANT
KIT MARKER MARGIN INK (KITS) ×2 IMPLANT
LIQUID BAND (GAUZE/BANDAGES/DRESSINGS) ×2 IMPLANT
NDL HYPO 25X1 1.5 SAFETY (NEEDLE) ×1 IMPLANT
NEEDLE HYPO 25X1 1.5 SAFETY (NEEDLE) ×2 IMPLANT
NS IRRIG 1000ML POUR BTL (IV SOLUTION) ×2 IMPLANT
PACK BASIN DAY SURGERY FS (CUSTOM PROCEDURE TRAY) ×2 IMPLANT
PENCIL BUTTON HOLSTER BLD 10FT (ELECTRODE) ×2 IMPLANT
SLEEVE SCD COMPRESS KNEE MED (MISCELLANEOUS) ×2 IMPLANT
SPONGE LAP 4X18 X RAY DECT (DISPOSABLE) ×2 IMPLANT
STAPLER VISISTAT 35W (STAPLE) IMPLANT
SUT MNCRL AB 4-0 PS2 18 (SUTURE) ×2 IMPLANT
SUT SILK 2 0 SH (SUTURE) IMPLANT
SUT VICRYL 3-0 CR8 SH (SUTURE) ×2 IMPLANT
SYR CONTROL 10ML LL (SYRINGE) ×2 IMPLANT
TOWEL OR 17X24 6PK STRL BLUE (TOWEL DISPOSABLE) ×2 IMPLANT
TOWEL OR NON WOVEN STRL DISP B (DISPOSABLE) ×2 IMPLANT
TUBE CONNECTING 20X1/4 (TUBING) ×1 IMPLANT
YANKAUER SUCT BULB TIP NO VENT (SUCTIONS) ×1 IMPLANT

## 2014-03-12 NOTE — Anesthesia Postprocedure Evaluation (Signed)
  Anesthesia Post-op Note  Patient: Karen Barnes  Procedure(s) Performed: Procedure(s): BREAST LUMPECTOMY WITH RADIOACTIVE SEED LOCALIZATION (Right)  Patient Location: PACU  Anesthesia Type: General   Level of Consciousness: awake, alert  and oriented  Airway and Oxygen Therapy: Patient Spontanous Breathing  Post-op Pain: mild  Post-op Assessment: Post-op Vital signs reviewed  Post-op Vital Signs: Reviewed  Last Vitals:  Filed Vitals:   03/12/14 1500  BP: 127/61  Pulse: 57  Temp:   Resp: 11    Complications: No apparent anesthesia complications

## 2014-03-12 NOTE — Anesthesia Preprocedure Evaluation (Signed)
Anesthesia Evaluation  Patient identified by MRN, date of birth, ID band Patient awake    Reviewed: Allergy & Precautions, NPO status , Patient's Chart, lab work & pertinent test results, reviewed documented beta blocker date and time   History of Anesthesia Complications Negative for: history of anesthetic complications  Airway Mallampati: II  TM Distance: >3 FB Neck ROM: Full    Dental  (+) Teeth Intact   Pulmonary neg shortness of breath, asthma , neg sleep apnea, neg COPDneg recent URI,  breath sounds clear to auscultation        Cardiovascular Pt. on home beta blockers + dysrhythmias Rhythm:Regular     Neuro/Psych negative neurological ROS  negative psych ROS   GI/Hepatic Neg liver ROS, hiatal hernia, GERD-  Medicated and Controlled,  Endo/Other  diabetes, Type obesity  Renal/GU negative Renal ROS     Musculoskeletal   Abdominal   Peds  Hematology negative hematology ROS (+)   Anesthesia Other Findings   Reproductive/Obstetrics                             Anesthesia Physical Anesthesia Plan  ASA: II  Anesthesia Plan: General   Post-op Pain Management:    Induction: Intravenous  Airway Management Planned: LMA  Additional Equipment: None  Intra-op Plan:   Post-operative Plan: Extubation in OR  Informed Consent: I have reviewed the patients History and Physical, chart, labs and discussed the procedure including the risks, benefits and alternatives for the proposed anesthesia with the patient or authorized representative who has indicated his/her understanding and acceptance.   Dental advisory given  Plan Discussed with: Surgeon and CRNA  Anesthesia Plan Comments:         Anesthesia Quick Evaluation

## 2014-03-12 NOTE — H&P (View-Only) (Signed)
Karen Barnes 02/16/2014 1:46 PM Location: [facility] Patient #: [id number] DOB: 11-27-1960 Married / Language: Undefined / Race: Undefined Female History of Present Illness Karen Fus A. Reya Aurich MD; 02/16/2014 2:20 PM) Patient words: right breast lesion  pt sent at the request of dr drew davis for right breast mass core biopsied to be sclerosing lesion. pt is bruised. family history of breast cancer.    Diagnosis Breast, right, needle core biopsy, 9:30 - COMPLEX SCLEROSING LESION WITH CALCIFICATIONS. - SEE COMMENT. Microscopic Comment The differential diagnosis includes a radial scar. The results were called to [facility] on 02/13/14. (JBK:gt, 02/13/14) Karen Leisure MD ADDENDUM: Patient returned today for planned ultrasound-guided core needle biopsy of the small lesions seen sonographically felt to be a correlate to the area of architectural distortion seen in the right breast on mammography. This lesion could not be confidently reproduced. No discrete suspicious lesion was seen on the pre biopsy ultrasound. For this reason, the ultrasound-guided core needle biopsy was canceled.  Patient will be scheduled for a problem-solving breast MRI for further evaluation of the right breast architectural distortion. If an enhancing lesion correlating with the architectural distortion in the right breast can be detected, this could then be targeted for MRI guided biopsy.   Electronically Signed By: Amie Portland M.D. On: 02/04/2014 09:15.  The patient is a 54 year old female   Other Problems Gilmer Mor, CMA; 02/16/2014 1:47 PM) Asthma Diabetes Mellitus Gastroesophageal Reflux Disease Kidney Stone Lump In Breast Other disease, cancer, significant illness  Past Surgical History Gilmer Mor, CMA; 02/16/2014 1:47 PM) Breast Biopsy Right. Cataract Surgery Bilateral. Cesarean Section - 1 Hysterectomy (not due to cancer) - Partial Oral  Surgery  Diagnostic Studies History Gilmer Mor, CMA; 02/16/2014 1:47 PM) Colonoscopy 1-5 years ago Mammogram within last year Pap Smear 1-5 years ago  Allergies Gilmer Mor, CMA; 02/16/2014 1:48 PM) Penicillamine *ASSORTED CLASSES*  Medication History (Sonya Bynum, CMA; 02/16/2014 1:49 PM) Atenolol (  Tablet, Oral) Active. Crestor (  Tablet, Oral) Active. Fluconazole (  Tablet, Oral) Active. Fluticasone Propionate (50MCG/ACT Suspension, Nasal) Active. MetFORMIN HCl (  Tablet, Oral) Active. Januvia (  Tablet, Oral) Active. Montelukast Sodium (  Tablet, Oral) Active. Omeprazole (  Capsule DR, Oral) Active. Qvar (80MCG/ACT Aerosol Soln, Inhalation) Active. Triamcinolone Acetonide (0.1% Ointment, External as needed) Active.  Social History Gilmer Mor, CMA; 02/16/2014 1:47 PM) Caffeine use Coffee. No alcohol use No drug use Tobacco use Never smoker.  Family History Gilmer Mor, CMA; 02/16/2014 1:47 PM) Alcohol Abuse Father. Breast Cancer Family Members In General, Mother. Diabetes Mellitus Father. Heart Disease Father, Mother. Heart disease in female family member before age 60 Heart disease in female family member before age 38 Prostate Cancer Family Members In General.  Pregnancy / Birth History Gilmer Mor, CMA; 02/16/2014 1:47 PM) Age at menarche 10 years. Age of menopause 51-55 Gravida 1 Maternal age 58-30 Para 1     Review of Systems Gilmer Mor CMA; 02/16/2014 1:47 PM) General Present- Night Sweats. Not Present- Appetite Loss, Chills, Fatigue, Fever, Weight Gain and Weight Loss. Skin Not Present- Change in Wart/Mole, Dryness, Hives, Jaundice, New Lesions, Non-Healing Wounds, Rash and Ulcer. HEENT Present- Sinus Pain. Not Present- Earache, Hearing Loss, Hoarseness, Nose Bleed, Oral Ulcers, Ringing in the Ears, Seasonal Allergies, Sore Throat, Visual Disturbances, Wears glasses/contact lenses and Yellow  Eyes. Respiratory Not Present- Bloody sputum, Chronic Cough, Difficulty Breathing, Snoring and Wheezing. Breast Present- Breast Mass. Not Present- Breast Pain, Nipple Discharge and Skin Changes. Cardiovascular Not Present- Chest Pain,  Difficulty Breathing Lying Down, Leg Cramps, Palpitations, Rapid Heart Rate, Shortness of Breath and Swelling of Extremities. Gastrointestinal Not Present- Abdominal Pain, Bloating, Bloody Stool, Change in Bowel Habits, Chronic diarrhea, Constipation, Difficulty Swallowing, Excessive gas, Gets full quickly at meals, Hemorrhoids, Indigestion, Nausea, Rectal Pain and Vomiting. Female Genitourinary Not Present- Frequency, Nocturia, Painful Urination, Pelvic Pain and Urgency. Musculoskeletal Not Present- Back Pain, Joint Pain, Joint Stiffness, Muscle Pain, Muscle Weakness and Swelling of Extremities. Neurological Not Present- Decreased Memory, Fainting, Headaches, Numbness, Seizures, Tingling, Tremor, Trouble walking and Weakness. Psychiatric Not Present- Anxiety, Bipolar, Change in Sleep Pattern, Depression, Fearful and Frequent crying. Endocrine Present- Hot flashes. Not Present- Cold Intolerance, Excessive Hunger, Hair Changes, Heat Intolerance and New Diabetes. Hematology Present- Easy Bruising. Not Present- Excessive bleeding, Gland problems, HIV and Persistent Infections.  Vitals (Sonya Bynum CMA; 02/16/2014 1:48 PM) 02/16/2014 1:47 PM Weight: 201 lb Height: 64in Body Surface Area: 2.03 m Body Mass Index: 34.5 kg/m Temp.: 44F(Temporal)  Pulse: 80 (Regular)  BP: 136/82 (Sitting, Left Arm, Standard)     Physical Exam (Ison Wichmann A. Avik Leoni MD; 02/16/2014 2:17 PM)  General Mental Status-Alert. General Appearance-Consistent with stated age. Hydration-Well hydrated. Voice-Normal.  Head and Neck Head-normocephalic, atraumatic with no lesions or palpable masses. Trachea-midline. Thyroid Gland Characteristics - normal size and  consistency.  Eye Eyeball - Bilateral-Extraocular movements intact. Sclera/Conjunctiva - Bilateral-No scleral icterus.  Chest and Lung Exam Chest and lung exam reveals -quiet, even and easy respiratory effort with no use of accessory muscles and on auscultation, normal breath sounds, no adventitious sounds and normal vocal resonance. Inspection Chest Wall - Normal. Back - normal.  Breast Breast - Left-Symmetric, Non Tender, No Biopsy scars, no Dimpling, No Inflammation, No Lumpectomy scars, No Mastectomy scars, No Peau d' Orange. Breast - Right-Symmetric, Non Tender, No Biopsy scars, no Dimpling, No Inflammation, No Lumpectomy scars, No Mastectomy scars, No Peau d' Orange. Breast Lump-No Palpable Breast Mass. Note: bruising right breast.   Cardiovascular Cardiovascular examination reveals -normal heart sounds, regular rate and rhythm with no murmurs and normal pedal pulses bilaterally.  Abdomen Inspection Inspection of the abdomen reveals - No Hernias. Skin - Scar - no surgical scars. Palpation/Percussion Palpation and Percussion of the abdomen reveal - Soft, Non Tender, No Rebound tenderness, No Rigidity (guarding) and No hepatosplenomegaly. Auscultation Auscultation of the abdomen reveals - Bowel sounds normal.  Neurologic Neurologic evaluation reveals -alert and oriented x 3 with no impairment of recent or remote memory. Mental Status-Normal.  Musculoskeletal Normal Exam - Left-Upper Extremity Strength Normal and Lower Extremity Strength Normal. Normal Exam - Right-Upper Extremity Strength Normal and Lower Extremity Strength Normal.  Lymphatic Head & Neck  General Head & Neck Lymphatics: Bilateral - Description - Normal. Axillary  General Axillary Region: Bilateral - Description - Normal. Tenderness - Non Tender. Femoral & Inguinal  Generalized Femoral & Inguinal Lymphatics: Bilateral - Description - Normal. Tenderness - Non  Tender.    Assessment & Plan (Sarabi Sockwell A. Yassin Scales MD; 02/16/2014 2:20 PM)  BREAST MASS, RIGHT (611.72  N63) Impression: recommend seed localized right breast lumpectomy for small but finite risk of malignancy. Risk of lumpectomy include bleeding, infection, seroma, more surgery, use of seed/wire, wound care, cosmetic deformity and the need for other treatments, death , blood clots, death. Pt agrees to proceed.  Current Plans Pt Education - [facility] Breast Biopsy Instructions (FLB): discussed with patient and provided information.

## 2014-03-12 NOTE — Op Note (Signed)
Preoperative diagnosis: right  breast sclerosing lesion  Postoperative diagnosis: Same   Procedure: right  breast seed localized lumpectomy  Surgeon: Harriette Bouillonhomas Sheron Robin M.D.  Anesthesia: Gen. With 0.25% Sensorcaine local  EBL: 20 cc  Specimen: right  breast tissue with clip and radioactive seed in the specimen. Verified with neoprobe and radiographic image showing both seed and clip in specimen  Indications for procedure: The patient presents for right breast  lumpectomy after core biopsy showed sclerosing lesion . Discussed the rationale for considering excision. Small risk of malignancy associated with  lesion after core biopsy. Discussed observation. Discussed wire localization. Patient desired excision of left breast papilloma.The procedure has been discussed with the patient. Alternatives to surgery have been discussed with the patient.  Risks of surgery include bleeding,  Infection,  Seroma formation, death,  and the need for further surgery.   The patient understands and wishes to proceed.   Description of procedure: Patient underwent seed placement as an outpatient. Patient presents today for right breast seed localized lumpectomy. Patient seen in  holding area. Questions are answered and neoprobe used to verify seed location. Patient taken back to the operating room and placed upon the OR table. After induction of general anesthesia, right breast prepped and draped in a sterile fashion. Timeout was done to verify proper  procedure. Neoprobe used and hot spot identified and right  breast upper-outer quadrant. This was marked with pen. Curvilinear incision made right  upper outer quadrant breast. Dissection used with the help of a neoprobe around the tissue where the seed and clip were located. Tissue removed in its entirety with gross margins negative .Marland Kitchen. Neoprobe used and seed within specimen. Radiographs taken which show clip and seed  In specimen.hemostasis achieved and cavity closed with 3-0  Vicryl and 4-0 Monocryl. Dermabond applied. All final counts found to be correct. Specimen transported to pathology. Patient awoke extubated taken to recovery in satisfactory condition.

## 2014-03-12 NOTE — Anesthesia Procedure Notes (Signed)
Procedure Name: LMA Insertion Date/Time: 03/12/2014 1:20 PM Performed by: Genevieve NorlanderLINKA, Karen Barnes Pre-anesthesia Checklist: Patient identified, Emergency Drugs available, Suction available, Patient being monitored and Timeout performed Patient Re-evaluated:Patient Re-evaluated prior to inductionOxygen Delivery Method: Circle System Utilized Preoxygenation: Pre-oxygenation with 100% oxygen Intubation Type: IV induction Ventilation: Mask ventilation without difficulty LMA: LMA inserted LMA Size: 4.0 Number of attempts: 1 Airway Equipment and Method: Bite block Placement Confirmation: positive ETCO2 Tube secured with: Tape Dental Injury: Teeth and Oropharynx as per pre-operative assessment

## 2014-03-12 NOTE — Interval H&P Note (Signed)
History and Physical Interval Note:  03/12/2014 1:02 PM  Karen Barnes  has presented today for surgery, with the diagnosis of Right Breast Mass  The various methods of treatment have been discussed with the patient and family. After consideration of risks, benefits and other options for treatment, the patient has consented to  Procedure(s): BREAST LUMPECTOMY WITH RADIOACTIVE SEED LOCALIZATION (Right) as a surgical intervention .  The patient's history has been reviewed, patient examined, no change in status, stable for surgery.  I have reviewed the patient's chart and labs.  Questions were answered to the patient's satisfaction.     Phyllip Claw A.

## 2014-03-12 NOTE — Discharge Instructions (Signed)
[facility] °Office Phone Number [phone] ° °BREAST BIOPSY/ PARTIAL MASTECTOMY: POST OP INSTRUCTIONS ° °Always review your discharge instruction sheet given to you by the facility where your surgery was performed. ° °IF YOU HAVE DISABILITY OR FAMILY LEAVE FORMS, YOU MUST BRING THEM TO THE OFFICE FOR PROCESSING.  DO NOT GIVE THEM TO YOUR DOCTOR. ° °1. A prescription for pain medication may be given to you upon discharge.  Take your pain medication as prescribed, if needed.  If narcotic pain medicine is not needed, then you may take acetaminophen (Tylenol) or ibuprofen (Advil) as needed. °2. Take your usually prescribed medications unless otherwise directed °3. If you need a refill on your pain medication, please contact your pharmacy.  They will contact our office to request authorization.  Prescriptions will not be filled after 5pm or on week-ends. °4. You should eat very light the first 24 hours after surgery, such as soup, crackers, pudding, etc.  Resume your normal diet the day after surgery. °5. Most patients will experience some swelling and bruising in the breast.  Ice packs and a good support bra will help.  Swelling and bruising can take several days to resolve.  °6. It is common to experience some constipation if taking pain medication after surgery.  Increasing fluid intake and taking a stool softener will usually help or prevent this problem from occurring.  A mild laxative (Milk of Magnesia or Miralax) should be taken according to package directions if there are no bowel movements after 48 hours. °7. Unless discharge instructions indicate otherwise, you may remove your bandages 24-48 hours after surgery, and you may shower at that time.  You may have steri-strips (small skin tapes) in place directly over the incision.  These strips should be left on the skin for 7-10 days.  If your surgeon used skin glue on the incision, you may shower in 24 hours.  The glue will flake off over the  next 2-3 weeks.  Any sutures or staples will be removed at the office during your follow-up visit. °8. ACTIVITIES:  You may resume regular daily activities (gradually increasing) beginning the next day.  Wearing a good support bra or sports bra minimizes pain and swelling.  You may have sexual intercourse when it is comfortable. °a. You may drive when you no longer are taking prescription pain medication, you can comfortably wear a seatbelt, and you can safely maneuver your car and apply brakes. °b. RETURN TO WORK:  ______________________________________________________________________________________ °9. You should see your doctor in the office for a follow-up appointment approximately two weeks after your surgery.  Your doctor’s nurse will typically make your follow-up appointment when she calls you with your pathology report.  Expect your pathology report 2-3 business days after your surgery.  You may call to check if you do not hear from us after three days. °10. OTHER INSTRUCTIONS: _______________________________________________________________________________________________ _____________________________________________________________________________________________________________________________________ °_____________________________________________________________________________________________________________________________________ °_____________________________________________________________________________________________________________________________________ ° °WHEN TO CALL YOUR DOCTOR: °1. Fever over 101.0 °2. Nausea and/or vomiting. °3. Extreme swelling or bruising. °4. Continued bleeding from incision. °5. Increased pain, redness, or drainage from the incision. ° °The clinic staff is available to answer your questions during regular business hours.  Please don’t hesitate to call and ask to speak to one of the nurses for clinical concerns.  If you have a medical emergency, go to the nearest  emergency room or call 911.  A surgeon from [facility] is always on call at the hospital. ° °For further questions, please visit [email or web address]  ° ° °  Post Anesthesia Home Care Instructions ° °Activity: °Get plenty of rest for the remainder of the day. A responsible adult should stay with you for 24 hours following the procedure.  °For the next 24 hours, DO NOT: °-Drive a car °-Operate machinery °-Drink alcoholic beverages °-Take any medication unless instructed by your physician °-Make any legal decisions or sign important papers. ° °Meals: °Start with liquid foods such as gelatin or soup. Progress to regular foods as tolerated. Avoid greasy, spicy, heavy foods. If nausea and/or vomiting occur, drink only clear liquids until the nausea and/or vomiting subsides. Call your physician if vomiting continues. ° °Special Instructions/Symptoms: °Your throat may feel dry or sore from the anesthesia or the breathing tube placed in your throat during surgery. If this causes discomfort, gargle with warm salt water. The discomfort should disappear within 24 hours. ° °

## 2014-03-12 NOTE — Transfer of Care (Signed)
Immediate Anesthesia Transfer of Care Note  Patient: Karen Barnes  Procedure(s) Performed: Procedure(s): BREAST LUMPECTOMY WITH RADIOACTIVE SEED LOCALIZATION (Right)  Patient Location: PACU  Anesthesia Type:General  Level of Consciousness: sedated and patient cooperative  Airway & Oxygen Therapy: Patient Spontanous Breathing and Patient connected to face mask oxygen  Post-op Assessment: Report given to RN and Post -op Vital signs reviewed and stable  Post vital signs: Reviewed and stable  Last Vitals:  Filed Vitals:   03/12/14 1140  BP: 134/73  Pulse: 63  Temp: 36.9 C  Resp: 20    Complications: No apparent anesthesia complications

## 2014-03-13 ENCOUNTER — Encounter (HOSPITAL_BASED_OUTPATIENT_CLINIC_OR_DEPARTMENT_OTHER): Payer: Self-pay | Admitting: Surgery

## 2014-03-20 ENCOUNTER — Encounter: Payer: Self-pay | Admitting: Internal Medicine

## 2014-03-20 ENCOUNTER — Ambulatory Visit (INDEPENDENT_AMBULATORY_CARE_PROVIDER_SITE_OTHER): Payer: BC Managed Care – PPO | Admitting: Internal Medicine

## 2014-03-20 ENCOUNTER — Other Ambulatory Visit: Payer: Self-pay

## 2014-03-20 VITALS — BP 141/82 | HR 77 | Temp 98.2°F | Ht 64.0 in | Wt 207.2 lb

## 2014-03-20 DIAGNOSIS — E119 Type 2 diabetes mellitus without complications: Secondary | ICD-10-CM

## 2014-03-20 DIAGNOSIS — Z0000 Encounter for general adult medical examination without abnormal findings: Secondary | ICD-10-CM

## 2014-03-20 MED ORDER — HYDROCORTISONE 2.5 % EX CREA: TOPICAL_CREAM | Freq: Two times a day (BID) | CUTANEOUS | Status: DC

## 2014-03-20 NOTE — Progress Notes (Signed)
Pre visit review using our clinic review tool, if applicable. No additional management support is needed unless otherwise documented below in the visit note. 

## 2014-03-20 NOTE — Patient Instructions (Signed)
Stop by the front desk and schedule labs to be done within few days (fasting)  Please come back to the office in 6 months  for a routine check up   Apply the cream twice a day to the right wrist, if you are not completely back to normal within a month please call the office

## 2014-03-20 NOTE — Progress Notes (Signed)
Subjective:    Patient ID: Karen Barnes, female    DOB: 12-05-1960, 54 y.o.   MRN: [id number]  DOS:  03/20/2014 Type of visit - description : cpx Interval history:  Had an abnormal mammogram November 2016, eventually underwent a lumpectomy 03/12/2014, fortunately biopsy was benign. Has to skin lesions at the right wrist, like small scabs, + itching.  Review of Systems  Constitutional: Negative for fever, chills, diaphoresis, appetite change and unexpected weight change.  HENT: Negative for dental problem, ear discharge, facial swelling, trouble swallowing and voice change.   Eyes: Negative for photophobia, discharge and redness.  Respiratory:       No cough or sputum production. No wheezing or SOB   Cardiovascular:       No CP or edema No palpitations   Gastrointestinal: Negative for nausea, vomiting, abdominal pain and diarrhea.       No blood in the stools   Endocrine: Negative for polydipsia, polyphagia and polyuria.  Genitourinary: Negative for urgency, frequency, hematuria and difficulty urinating.       No dysuria   Musculoskeletal: Negative for joint swelling.       No unusual aches or pains  Skin: Negative for color change and pallor.  Allergic/Immunologic: Negative for environmental allergies and food allergies.  Neurological: Negative for dizziness and syncope.       No headaches   Hematological: Negative for adenopathy. Does not bruise/bleed easily.  Psychiatric/Behavioral: Negative for suicidal ideas, hallucinations, behavioral problems and confusion.       No unusual or severe anxiety-depression     Past Medical History  Diagnosis Date  . Palpitations     PACs sees cards   . Diabetes mellitus     A1C 6.2---->  2009  . Asthma     sees Dr Darcey Nora  . GERD (gastroesophageal reflux disease)   . Hot flashes   . Hyperlipidemia   . Normal nuclear stress test 2006  . Fibrocystic breast   . Recurrent cystitis     took  postcoital antibiotic for years,  symptoms better after her hysterectomy 1996, symptoms re-surface in 2011  . History of hiatal hernia   . Wears glasses   . Family history of adverse reaction to anesthesia     sister had cardiac arrest with rhinoplasty    Past Surgical History  Procedure Laterality Date  . Cataract extraction Bilateral 2011  . Cesarean section    . Abdominal hysterectomy  1996    due to endometriosis and fibroids approximately 1996  . Dilation and curettage of uterus    . Lipoma excision  1978  . Colonoscopy    . Upper gastrointestinal endoscopy    . Breast lumpectomy with radioactive seed localization Right 03/12/2014    Procedure: BREAST LUMPECTOMY WITH RADIOACTIVE SEED LOCALIZATION;  Surgeon: Erroll Luna, MD;  Location: [facility];  Service: General;  Laterality: Right;  . Breast surgery      Lumpectomy-- BENIGN     History   Social History  . Marital Status: Married    Spouse Name: N/A  . Number of Children: 1  . Years of Education: N/A   Occupational History  . retired principal    Social History Main Topics  . Smoking status: Never Smoker   . Smokeless tobacco: Never Used  . Alcohol Use: No  . Drug Use: No  . Sexual Activity: Yes   Other Topics Concern  . Not on file   Social History Narrative  Household-- pt and husband      Family History  Problem Relation Age of Onset  . Mitral valve prolapse Mother   . Heart disease Father     early   . Diabetes Father   . Breast cancer Mother     M had ca insitu (age 55), aunt   . Colon cancer Neg Hx   . Brain cancer Other     2 fam members   . Lung cancer Other     uncle, heavy smoker       Medication List       This list is accurate as of: 03/20/14 11:59 PM.  Always use your most recent med list.               ACCU-CHEK ADVANTAGE DIABETES kit  Diabetes testing supply brand of choice     albuterol 108 (90 BASE) MCG/ACT inhaler  Commonly known as:  PROVENTIL HFA;VENTOLIN HFA  Inhale 2 puffs into  the lungs every 6 (six) hours as needed.     aspirin 81 MG tablet  Take 81 mg by mouth daily.     atenolol 50 MG tablet  Commonly known as:  TENORMIN  Take 1 tablet (50 mg total) by mouth daily.     azelastine 0.1 % nasal spray  Commonly known as:  ASTELIN  Place 2 sprays into both nostrils 2 (two) times daily.     beclomethasone 80 MCG/ACT inhaler  Commonly known as:  QVAR  Inhale 2 puffs into the lungs 2 (two) times daily. As needed     bifidobacterium infantis capsule  Take 1 capsule by mouth daily.     fexofenadine 180 MG tablet  Commonly known as:  ALLEGRA  Take 180 mg by mouth daily.     fluticasone 50 MCG/ACT nasal spray  Commonly known as:  FLONASE  Place 2 sprays into the nose daily.     hydrocortisone 2.5 % cream  Apply topically 2 (two) times daily.     JANUVIA 100 MG tablet  Generic drug:  sitaGLIPtin  TAKE 1 TABLET BY MOUTH EVERY DAY     metFORMIN 1000 MG tablet  Commonly known as:  GLUCOPHAGE  TAKE 1 TABLET BY MOUTH TWICE DAILY WITH A MEAL     montelukast 10 MG tablet  Commonly known as:  SINGULAIR  Take 10 mg by mouth at bedtime.     multivitamin with minerals tablet  Take 1 tablet by mouth daily.     nitrofurantoin (macrocrystal-monohydrate) 100 MG capsule  Commonly known as:  MACROBID  TAKE AS DIRECTED, PRN     omeprazole 40 MG capsule  Commonly known as:  PRILOSEC  Take 1 capsule (40 mg total) by mouth at bedtime.     promethazine 12.5 MG tablet  Commonly known as:  PHENERGAN  Take 1 tablet (12.5 mg total) by mouth every 8 (eight) hours as needed for nausea.     rosuvastatin 5 MG tablet  Commonly known as:  CRESTOR  TAKE 1 TABLET BY MOUTH DAILY           Objective:   Physical Exam  Constitutional: She is oriented to person, place, and time. She appears well-developed. No distress.  HENT:  Head: Normocephalic and atraumatic.  Neck: Normal range of motion. Neck supple. No tracheal deviation present. No thyromegaly present.    Normal carotid pulses  Cardiovascular:  RRR, no murmur , rub or gallop  Pulmonary/Chest: Effort normal. No stridor. No respiratory distress.  CTA B  Abdominal: Soft. Bowel sounds are normal. She exhibits no distension and no mass. There is no tenderness. There is no rebound and no guarding.  No organomegaly  Musculoskeletal: She exhibits no edema or tenderness.  Lymphadenopathy:    She has no cervical adenopathy.  Neurological: She is alert and oriented to person, place, and time. No cranial nerve deficit. She exhibits normal muscle tone. Coordination normal.  Speech normal, gait unassisted and normal for age, motor strength appropriate for age   Skin: Skin is warm and dry. No pallor.  No jaundice  Psychiatric: She has a normal mood and affect. Her behavior is normal. Judgment and thought content normal.  Vitals reviewed.       Assessment & Plan:   Problem List Items Addressed This Visit      Endocrine   DMII (diabetes mellitus, type 2)   Relevant Orders   Hemoglobin A1c     Other   Annual physical exam - Primary    Td 09 Had a flu shot zostavax-- discussed before   cscope ~ 05-17-10 Dr Damita Lack, repeat 5 years ,see report of bx Abnormal MMG---> Lumpectomy 03-12-14 : benign  Saw Dr Matthew Saras, to see a new Gyn Dr Darnell Level. Atkins    Discussed diet, exercise. Labs: You for an FLP and A1c Other issues: Bone density test2015 negative Has 2 small scabs on the right wrist, will try hydrocortisone 2.5%, if not better she will let me know Asthma, currently well controlled, she sees the allergist Diabetes, admits to exercise and eating more than usual, partially  due to stress, she is ready to go back to a more healthier lifestyle High cholesterol, good compliance with Crestor, due for FLP.  Return to the clinic in 6 months      Relevant Orders   Lipid panel

## 2014-03-20 NOTE — Assessment & Plan Note (Addendum)
Td 09 Had a flu shot zostavax-- discussed before   cscope ~ 05-17-10 Dr Harrison MonsMaggod, repeat 5 years ,see report of bx Abnormal MMG---> Lumpectomy 03-12-14 : benign  Saw Dr Marcelle OverlieHolland, to see a new Gyn Dr Reece AgarG. Atkins    Discussed diet, exercise. Labs: due for an FLP and A1c Other issues: Bone density test 2015 negative Has 2 small scabs on the right wrist, will try hydrocortisone 2.5%, if not better she will let me know Asthma, currently well controlled, she sees the allergist Diabetes, admits to exercise and eating more than usual, partially  due to stress, she is ready to go back to a more healthier lifestyle High cholesterol, good compliance with Crestor, due for FLP.  Return to the clinic in 6 months

## 2014-03-30 ENCOUNTER — Other Ambulatory Visit: Payer: BC Managed Care – PPO

## 2014-03-31 ENCOUNTER — Telehealth: Payer: Self-pay | Admitting: Internal Medicine

## 2014-03-31 MED ORDER — OMEPRAZOLE 40 MG PO CPDR: 40.0000 mg | DELAYED_RELEASE_CAPSULE | Freq: Every day | ORAL | Status: DC

## 2014-03-31 NOTE — Telephone Encounter (Signed)
[facility]  Call back number: [phone]   Reason for call:  As per pharmacy the RX omeprazole (PRILOSEC) 40 MG capsule should be a quantitive review based on insurance. Pt needs 180 tablets

## 2014-03-31 NOTE — Telephone Encounter (Signed)
Please advise 

## 2014-03-31 NOTE — Telephone Encounter (Signed)
Quantity changed to # 180.

## 2014-03-31 NOTE — Telephone Encounter (Signed)
Okay to prescribe 180 tablets, a three-month supply, if needed

## 2014-04-02 NOTE — Telephone Encounter (Signed)
Patient states that her insurance is requiring a prior auth on omeprazole for the quantity.

## 2014-04-02 NOTE — Telephone Encounter (Signed)
Quantity limit exception paperwork faxed. Insurance will only pay for 90 pills in a 180 day period. Awaiting determination. JG//CMA

## 2014-04-02 NOTE — Telephone Encounter (Signed)
Patient would like a callback as soon as possible. She states that nexium is not working for her in the meantime.

## 2014-04-02 NOTE — Telephone Encounter (Signed)
Only info I can give patient is that a quantity limit exception can be done.  Please advise.

## 2014-04-02 NOTE — Telephone Encounter (Addendum)
ok, let me know if I need to do something else

## 2014-04-02 NOTE — Telephone Encounter (Signed)
Received call from Pt informing that her Omeprazole is in need of prior authorization, which Saintclair HalstedJessica G has started, awaiting feedback. In the meantime, Pt wants to know what she can do as Nexium is not helping with her symptoms. Please advise.

## 2014-04-07 ENCOUNTER — Other Ambulatory Visit (INDEPENDENT_AMBULATORY_CARE_PROVIDER_SITE_OTHER): Payer: BC Managed Care – PPO

## 2014-04-07 DIAGNOSIS — Z0000 Encounter for general adult medical examination without abnormal findings: Secondary | ICD-10-CM

## 2014-04-07 DIAGNOSIS — E119 Type 2 diabetes mellitus without complications: Secondary | ICD-10-CM

## 2014-04-07 LAB — LIPID PANEL
CHOL/HDL RATIO: 3
Cholesterol: 150 mg/dL (ref 0–200)
HDL: 59.3 mg/dL (ref >39.00)
LDL Cholesterol: 74 mg/dL (ref 0–99)
NonHDL: 90.7
Triglycerides: 86 mg/dL (ref 0.0–149.0)
VLDL: 17.2 mg/dL (ref 0.0–40.0)

## 2014-04-07 LAB — HEMOGLOBIN A1C: Hgb A1c MFr Bld: 6.7 % — ABNORMAL HIGH (ref 4.6–6.5)

## 2014-04-15 ENCOUNTER — Other Ambulatory Visit: Payer: Self-pay | Admitting: Internal Medicine

## 2014-05-06 ENCOUNTER — Other Ambulatory Visit: Payer: Self-pay | Admitting: Cardiology

## 2014-05-06 ENCOUNTER — Other Ambulatory Visit: Payer: Self-pay | Admitting: Internal Medicine

## 2014-05-20 ENCOUNTER — Other Ambulatory Visit: Payer: Self-pay | Admitting: Cardiology

## 2014-06-01 ENCOUNTER — Telehealth: Payer: Self-pay | Admitting: Internal Medicine

## 2014-06-01 NOTE — Telephone Encounter (Signed)
Please advise 

## 2014-06-01 NOTE — Telephone Encounter (Signed)
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 Please advise

## 2014-06-01 NOTE — Telephone Encounter (Signed)
That  is okay, will discuss further when she comes back in August

## 2014-06-03 ENCOUNTER — Ambulatory Visit: Payer: BC Managed Care – PPO | Admitting: Nurse Practitioner

## 2014-06-08 ENCOUNTER — Telehealth: Payer: Self-pay | Admitting: Internal Medicine

## 2014-06-08 MED ORDER — NITROFURANTOIN MONOHYD MACRO 100 MG PO CAPS: ORAL_CAPSULE | ORAL | Status: DC

## 2014-06-08 NOTE — Telephone Encounter (Signed)
Caller Name: Kathee PoliteBeverly Hassan Relation to Patient: self Phone #: [phone] [facility] [address], [place] [phone] Reason for Call: taking medication as needed post coital. No she is post menopausal and having issues more frequently. She would like refill of med sent in for her.  nitrofurantoin, macrocrystal-monohydrate, (MACROBID) 100 MG capsule [[id number]][[id number]]

## 2014-06-08 NOTE — Telephone Encounter (Signed)
Rx sent to [facility] pharmacy.  

## 2014-06-08 NOTE — Telephone Encounter (Signed)
Ok #30, no RF 

## 2014-06-08 NOTE — Telephone Encounter (Signed)
Please advise 

## 2014-06-22 ENCOUNTER — Ambulatory Visit (INDEPENDENT_AMBULATORY_CARE_PROVIDER_SITE_OTHER): Payer: BC Managed Care – PPO | Admitting: Physician Assistant

## 2014-06-22 ENCOUNTER — Encounter: Payer: Self-pay | Admitting: Physician Assistant

## 2014-06-22 VITALS — BP 138/84 | HR 67 | Ht 64.0 in | Wt 206.0 lb

## 2014-06-22 DIAGNOSIS — R030 Elevated blood-pressure reading, without diagnosis of hypertension: Secondary | ICD-10-CM

## 2014-06-22 DIAGNOSIS — R002 Palpitations: Secondary | ICD-10-CM

## 2014-06-22 DIAGNOSIS — E669 Obesity, unspecified: Secondary | ICD-10-CM

## 2014-06-22 DIAGNOSIS — Z87898 Personal history of other specified conditions: Secondary | ICD-10-CM | POA: Diagnosis not present

## 2014-06-22 DIAGNOSIS — Z9189 Other specified personal risk factors, not elsewhere classified: Secondary | ICD-10-CM

## 2014-06-22 MED ORDER — ROSUVASTATIN CALCIUM 5 MG PO TABS: 5.0000 mg | ORAL_TABLET | Freq: Every day | ORAL | Status: DC

## 2014-06-22 MED ORDER — ATENOLOL 50 MG PO TABS: 50.0000 mg | ORAL_TABLET | Freq: Every day | ORAL | Status: DC

## 2014-06-22 NOTE — Patient Instructions (Signed)
Medication Instructions:  A prescription for you Atenolol and Crestor to [facility] [address].  Labwork: None  Testing/Procedures: None  Follow-Up: Your physician wants you to follow-up in: 1 year with Dr. Shirlee LatchMcLean. You will receive a reminder letter in the mail two months in advance. If you don't receive a letter, please call our office to schedule the follow-up appointment.   Any Other Special Instructions Will Be Listed Below (If Applicable).  Low-Sodium Eating Plan Sodium raises blood pressure and causes water to be held in the body. Getting less sodium from food will help lower your blood pressure, reduce any swelling, and protect your heart, liver, and kidneys. We get sodium by adding salt (sodium chloride) to food. Most of our sodium comes from canned, boxed, and frozen foods. Restaurant foods, fast foods, and pizza are also very high in sodium. Even if you take medicine to lower your blood pressure or to reduce fluid in your body, getting less sodium from your food is important. WHAT IS MY PLAN? Most people should limit their sodium intake to 2,300 mg a day. Your health care provider recommends that you limit your sodium intake to ___2 grams (2,000mg )___ a day.  WHAT DO I NEED TO KNOW ABOUT THIS EATING PLAN? For the low-sodium eating plan, you will follow these general guidelines:  Choose foods with a % Daily Value for sodium of less than 5% (as listed on the food label).   Use salt-free seasonings or herbs instead of table salt or sea salt.   Check with your health care provider or pharmacist before using salt substitutes.   Eat fresh foods.  Eat more vegetables and fruits.  Limit canned vegetables. If you do use them, rinse them well to decrease the sodium.   Limit cheese to 1 oz (28 g) per day.   Eat lower-sodium products, often labeled as "lower sodium" or "no salt added."  Avoid foods that contain monosodium glutamate (MSG). MSG is sometimes added to [place]  food and some canned foods.  Check food labels (Nutrition Facts labels) on foods to learn how much sodium is in one serving.  Eat more home-cooked food and less restaurant, buffet, and fast food.  When eating at a restaurant, ask that your food be prepared with less salt or none, if possible.  HOW DO I READ FOOD LABELS FOR SODIUM INFORMATION? The Nutrition Facts label lists the amount of sodium in one serving of the food. If you eat more than one serving, you must multiply the listed amount of sodium by the number of servings. Food labels may also identify foods as:  Sodium free--Less than 5 mg in a serving.  Very low sodium--35 mg or less in a serving.  Low sodium--140 mg or less in a serving.  Light in sodium--50% less sodium in a serving. For example, if a food that usually has 300 mg of sodium is changed to become light in sodium, it will have 150 mg of sodium.  Reduced sodium--25% less sodium in a serving. For example, if a food that usually has 400 mg of sodium is changed to reduced sodium, it will have 300 mg of sodium. WHAT FOODS CAN I EAT? Grains Low-sodium cereals, including oats, puffed wheat and rice, and shredded wheat cereals. Low-sodium crackers. Unsalted rice and pasta. Lower-sodium bread.  Vegetables Frozen or fresh vegetables. Low-sodium or reduced-sodium canned vegetables. Low-sodium or reduced-sodium tomato sauce and paste. Low-sodium or reduced-sodium tomato and vegetable juices.  Fruits Fresh, frozen, and canned fruit.  Fruit juice.  Meat and Other Protein Products Low-sodium canned tuna and salmon. Fresh or frozen meat, poultry, seafood, and fish. Lamb. Unsalted nuts. Dried beans, peas, and lentils without added salt. Unsalted canned beans. Homemade soups without salt. Eggs.  Dairy Milk. Soy milk. Ricotta cheese. Low-sodium or reduced-sodium cheeses. Yogurt.  Condiments Fresh and dried herbs and spices. Salt-free seasonings. Onion and garlic powders.  Low-sodium varieties of mustard and ketchup. Lemon juice.  Fats and Oils Reduced-sodium salad dressings. Unsalted butter.  Other Unsalted popcorn and pretzels.  The items listed above may not be a complete list of recommended foods or beverages. Contact your dietitian for more options. WHAT FOODS ARE NOT RECOMMENDED? Grains Instant hot cereals. Bread stuffing, pancake, and biscuit mixes. Croutons. Seasoned rice or pasta mixes. Noodle soup cups. Boxed or frozen macaroni and cheese. Self-rising flour. Regular salted crackers. Vegetables Regular canned vegetables. Regular canned tomato sauce and paste. Regular tomato and vegetable juices. Frozen vegetables in sauces. Salted french fries. Olives. Rosita FirePickles. Relishes. Sauerkraut. Salsa. Meat and Other Protein Products Salted, canned, smoked, spiced, or pickled meats, seafood, or fish. Bacon, ham, sausage, hot dogs, corned beef, chipped beef, and packaged luncheon meats. Salt pork. Jerky. Pickled herring. Anchovies, regular canned tuna, and sardines. Salted nuts. Dairy Processed cheese and cheese spreads. Cheese curds. Blue cheese and cottage cheese. Buttermilk.  Condiments Onion and garlic salt, seasoned salt, table salt, and sea salt. Canned and packaged gravies. Worcestershire sauce. Tartar sauce. Barbecue sauce. Teriyaki sauce. Soy sauce, including reduced sodium. Steak sauce. Fish sauce. Oyster sauce. Cocktail sauce. Horseradish. Regular ketchup and mustard. Meat flavorings and tenderizers. Bouillon cubes. Hot sauce. Tabasco sauce. Marinades. Taco seasonings. Relishes. Fats and Oils Regular salad dressings. Salted butter. Margarine. Ghee. Bacon fat.  Other Potato and tortilla chips. Corn chips and puffs. Salted popcorn and pretzels. Canned or dried soups. Pizza. Frozen entrees and pot pies.  The items listed above may not be a complete list of foods and beverages to avoid. Contact your dietitian for more information. Document  Released: 07/15/2001 Document Revised: 01/28/2013 Document Reviewed: 11/27/2012 [facility] Patient Information 2015 [place] [place]. This information is not intended to replace advice given to you by your health care provider. Make sure you discuss any questions you have with your health care provider.

## 2014-06-22 NOTE — Assessment & Plan Note (Signed)
Continue atenolol, aspirin, Crestor. Recommend weight loss and exercise program. Follow-up with Dr. Shirlee LatchMcLean in one year

## 2014-06-22 NOTE — Progress Notes (Signed)
Cardiology Office Note   Date:  06/22/2014   ID:  Karen Barnes, DOB [birth year], MRN [id number]  PCP:  Kathlene November, MD  Cardiologist: Loralie Champagne, MD  Chief Complaint: Irregular heart rhythm    History of Present Illness: Karen Barnes is a 54 y.o. female who presents for yearly follow up. She has a history of DM 2, hypertension, and symptomatic palpitations controlled with atenolol.. She has a strong family history of premature CAD. She had a normal stress test in 2006. Her father had an MI at 62 and later CABG. Her mother had atrial fibrillation. 2 grandparents with presumed SCD. Uncles with MIs in their 86s. Lipid profile in 04/07/14 cholesterol 150 triglycerides 86 HDL 59 and LDL 74.  Patient says over the past year she said 2 episodes of palpitations. Both awakened her from sleep and she describes a rapid irregular heart rhythm that lasted for about 20 minutes. She did some deep breathing and tried to calm herself. Both episodes happened when she was under a lot of stress, had a lack of sleep, and had been drinking unsweetened tea the evening before. She has had no exertional symptoms. She does not exercise regularly as she has been taking care of her 55-month-old granddaughter full-time. She admits to cooking with salt and adding salt to her foods. She has occasional ankle edema when she sits for long periods of time at the computer. She denies any chest pain outside of her occasional GERD symptoms, dyspnea, dyspnea on exertion, dizziness or presyncope.     Past Medical History  Diagnosis Date  . Palpitations     PACs sees cards   . Diabetes mellitus     A1C 6.2---->  2009  . Asthma     sees Dr Darcey Nora  . GERD (gastroesophageal reflux disease)   . Hot flashes   . Hyperlipidemia   . Normal nuclear stress test 2006  . Fibrocystic breast   . Recurrent cystitis     took  postcoital antibiotic for years, symptoms better after her hysterectomy 1996, symptoms re-surface in 2011   . History of hiatal hernia   . Wears glasses   . Family history of adverse reaction to anesthesia     sister had cardiac arrest with rhinoplasty    Past Surgical History  Procedure Laterality Date  . Cataract extraction Bilateral 2011  . Cesarean section    . Abdominal hysterectomy  1996    due to endometriosis and fibroids approximately 1996  . Dilation and curettage of uterus    . Lipoma excision  1978  . Colonoscopy    . Upper gastrointestinal endoscopy    . Breast lumpectomy with radioactive seed localization Right 03/12/2014    Procedure: BREAST LUMPECTOMY WITH RADIOACTIVE SEED LOCALIZATION;  Surgeon: Erroll Luna, MD;  Location: [facility];  Service: General;  Laterality: Right;  . Breast surgery      Lumpectomy-- BENIGN      Current Outpatient Prescriptions  Medication Sig Dispense Refill  . albuterol (PROVENTIL HFA;VENTOLIN HFA) 108 (90 BASE) MCG/ACT inhaler Inhale 2 puffs into the lungs every 6 (six) hours as needed.    Marland Kitchen aspirin 81 MG tablet Take 81 mg by mouth daily.      Marland Kitchen atenolol (TENORMIN) 50 MG tablet Take 1 tablet (50 mg total) by mouth daily. 90 tablet 3  . beclomethasone (QVAR) 80 MCG/ACT inhaler Inhale 2 puffs into the lungs 2 (two) times daily. As needed    . bifidobacterium infantis (  ALIGN) capsule Take 1 capsule by mouth daily.    . Blood Glucose Monitoring Suppl (ACCU-CHEK ADVANTAGE DIABETES) kit Diabetes testing supply brand of choice 1 each 0  . fexofenadine (ALLEGRA) 180 MG tablet Take 180 mg by mouth daily.      . fluticasone (FLONASE) 50 MCG/ACT nasal spray Place 2 sprays into the nose daily.      . hydrocortisone 2.5 % cream Apply topically 2 (two) times daily. 30 g 0  . JANUVIA 100 MG tablet TAKE 1 TABLET BY MOUTH DAILY 30 tablet 5  . metFORMIN (GLUCOPHAGE) 1000 MG tablet TAKE 1 TABLET BY MOUTH TWICE DAILY WITH A MEAL 180 tablet 3  . montelukast (SINGULAIR) 10 MG tablet Take 10 mg by mouth at bedtime.      . Multiple  Vitamins-Minerals (MULTIVITAMIN WITH MINERALS) tablet Take 1 tablet by mouth daily.    . nitrofurantoin, macrocrystal-monohydrate, (MACROBID) 100 MG capsule TAKE AS DIRECTED, PRN 30 capsule 0  . omeprazole (PRILOSEC) 40 MG capsule Take 1 capsule (40 mg total) by mouth at bedtime. 180 capsule 1  . promethazine (PHENERGAN) 12.5 MG tablet Take 1 tablet (12.5 mg total) by mouth every 8 (eight) hours as needed for nausea. 20 tablet 0  . rosuvastatin (CRESTOR) 5 MG tablet Take 1 tablet (5 mg total) by mouth daily. 90 tablet 3   No current facility-administered medications for this visit.    Allergies:   Butorphanol tartrate; Hydrocodone; and Penicillins    Social History:  The patient  reports that she has never smoked. She has never used smokeless tobacco. She reports that she does not drink alcohol or use illicit drugs.   Family History:  The patient'sfamily history includes Brain cancer in her other; Breast cancer in her mother; Diabetes in her father; Heart disease in her father; Lung cancer in her other; Mitral valve prolapse in her mother. There is no history of Colon cancer.    ROS:  Please see the history of present illness.   Otherwise, review of systems are positive for none.   All other systems are reviewed and negative.    PHYSICAL EXAM: VS:  BP 138/84 mmHg  Pulse 67  Ht $R'5\' 4"'Xx$  (1.626 m)  Wt 206 lb (93.441 kg)  BMI 35.34 kg/m2 , BMI Body mass index is 35.34 kg/(m^2). GEN: Obese, well developed, in no acute distress Neck: no JVD, HJR, carotid bruits, or masses Cardiac: RRR; no murmurs,gallop, rubs, thrill or heave,  Respiratory:  clear to auscultation bilaterally, normal work of breathing GI: soft, nontender, nondistended, + BS MS: no deformity or atrophy Extremities:trace ankle edema otherwise without cyanosis, clubbing, good distal pulses bilaterally.  Skin: warm and dry, no rash Neuro:  Strength and sensation are intact    EKG:  EKG is ordered today. The ekg ordered  today demonstrates normal sinus rhythm, normal EKG   Recent Labs: 03/10/2014: ALT 21; BUN 11; Creatinine 0.64; Hemoglobin 12.8; Platelets 260; Potassium 4.4; Sodium 140    Lipid Panel    Component Value Date/Time   CHOL 150 04/07/2014 0806   TRIG 86.0 04/07/2014 0806   HDL 59.30 04/07/2014 0806   CHOLHDL 3 04/07/2014 0806   VLDL 17.2 04/07/2014 0806   LDLCALC 74 04/07/2014 0806      Wt Readings from Last 3 Encounters:  06/22/14 206 lb (93.441 kg)  03/20/14 207 lb 4 oz (94.008 kg)  03/12/14 201 lb (91.173 kg)      Other studies Reviewed: Additional studies/ records that were reviewed today  include and review of the records demonstrates: Normal GXT 2006  ASSESSMENT AND PLAN: Heart palpitations Patient had 2 episodes of palpitations that awaken her from sleep in the past year associated with excessive caffeine, increased stress and resolved within 20 minutes. Patient has family history of atrial fibrillation. I've asked her to call us if she has any increase in the symptoms so we can place the monitor on her. Decrease caffeine intake and increase exercise. Most palpitations are controlled with atenolol.   Elevated blood pressure reading BP stable, recommend 2 g sodium diet   Obesity Weight loss and exercise program was discussed at length.   HYPERLIPIDEMIA Recent lipid profile in March was normal. Continue Crestor.   At risk for coronary artery disease Continue atenolol, aspirin, Crestor. Recommend weight loss and exercise program. Follow-up with Dr. Aundra Dubin in one year      Signed, Ermalinda Barrios, PA-C  06/22/2014 [address], [place], [place]  [address] Phone: [phone]; Fax: [phone]

## 2014-06-22 NOTE — Assessment & Plan Note (Signed)
Patient had 2 episodes of palpitations that awaken her from sleep in the past year associated with excessive caffeine, increased stress and resolved within 20 minutes. Patient has family history of atrial fibrillation. I've asked her to call us if she has any increase in the symptoms so we can place the monitor on her. Decrease caffeine intake and increase exercise. Most palpitations are controlled with atenolol.

## 2014-06-22 NOTE — Assessment & Plan Note (Signed)
BP stable, recommend 2 g sodium diet

## 2014-06-22 NOTE — Assessment & Plan Note (Signed)
Weight loss and exercise program was discussed at length.

## 2014-06-22 NOTE — Assessment & Plan Note (Signed)
Recent lipid profile in March was normal. Continue Crestor.

## 2014-07-09 ENCOUNTER — Telehealth: Payer: Self-pay | Admitting: Internal Medicine

## 2014-07-09 NOTE — Telephone Encounter (Signed)
Patient requesting Karen HaywardOmnicef which she states she has taken before from her asthma doctor, Dr. Pecola LawlessBardelith.    She reports that yesterday she started having a sore throat and she had to use her inhaler.  She started prednisone last night (was given to her by Dr. Pecola LawlessBardelith ) and had some relief.  She reports runny nose and today she began to cough up a thick yellow mucous.  She states she has a new allergist- she now goes to [facility] and was told her doctor was out of office until next Tuesday and nobody else could see her.    Notified patient that Dr. Drue NovelPaz was out of the office today and that I could forward him the message, but she may need to be seen.  Patient declined any appointment stating that she was going out of town in the morning.  She states that by tomorrow it will be too late and she would rather go to a walk-in clinic.

## 2014-07-09 NOTE — Telephone Encounter (Signed)
Relation to pt: self Call back number: [phone] [facility]: [id number] - [place], [place] - [address] [facility] [address] [phone] (Phone) [phone] (Fax)         Reason for call:   Pt states her allergist is out of town and pt is going on vacation in the morning. Requesting omnicef derivative of penicillin RX. Please advise

## 2014-07-14 ENCOUNTER — Telehealth: Payer: Self-pay

## 2014-07-14 NOTE — Telephone Encounter (Signed)
[facility] 07/09/2014 regarding URI and asthma symptoms. Pt given Cefdinir 300 mg 1 capsule by mouth twice daily, and Prednisone 50 mg 1 tablet daily by mouth as directed. LMOM informing Pt to return call regarding how she is doing. OV notes sent for scanning into chart.

## 2014-07-30 ENCOUNTER — Other Ambulatory Visit: Payer: Self-pay | Admitting: Cardiology

## 2014-08-03 ENCOUNTER — Other Ambulatory Visit: Payer: Self-pay

## 2014-08-12 ENCOUNTER — Other Ambulatory Visit: Payer: Self-pay | Admitting: Cardiology

## 2014-08-16 ENCOUNTER — Encounter (HOSPITAL_COMMUNITY): Payer: Self-pay | Admitting: Emergency Medicine

## 2014-08-16 ENCOUNTER — Emergency Department (HOSPITAL_COMMUNITY): Payer: BC Managed Care – PPO

## 2014-08-16 ENCOUNTER — Emergency Department (HOSPITAL_COMMUNITY)
Admission: EM | Admit: 2014-08-16 | Discharge: 2014-08-16 | Disposition: A | Discharge status: Home / Self Care | Payer: BC Managed Care – PPO | Attending: Emergency Medicine | Admitting: Emergency Medicine

## 2014-08-16 DIAGNOSIS — Z90710 Acquired absence of both cervix and uterus: Secondary | ICD-10-CM | POA: Diagnosis not present

## 2014-08-16 DIAGNOSIS — E785 Hyperlipidemia, unspecified: Secondary | ICD-10-CM | POA: Diagnosis not present

## 2014-08-16 DIAGNOSIS — Z9889 Other specified postprocedural states: Secondary | ICD-10-CM | POA: Diagnosis not present

## 2014-08-16 DIAGNOSIS — Z880 Allergy status to penicillin: Secondary | ICD-10-CM | POA: Diagnosis not present

## 2014-08-16 DIAGNOSIS — Z79899 Other long term (current) drug therapy: Secondary | ICD-10-CM | POA: Insufficient documentation

## 2014-08-16 DIAGNOSIS — Z7952 Long term (current) use of systemic steroids: Secondary | ICD-10-CM | POA: Diagnosis not present

## 2014-08-16 DIAGNOSIS — J45909 Unspecified asthma, uncomplicated: Secondary | ICD-10-CM | POA: Diagnosis not present

## 2014-08-16 DIAGNOSIS — Z7951 Long term (current) use of inhaled steroids: Secondary | ICD-10-CM | POA: Diagnosis not present

## 2014-08-16 DIAGNOSIS — R1013 Epigastric pain: Secondary | ICD-10-CM | POA: Diagnosis not present

## 2014-08-16 DIAGNOSIS — E119 Type 2 diabetes mellitus without complications: Secondary | ICD-10-CM | POA: Diagnosis not present

## 2014-08-16 DIAGNOSIS — K219 Gastro-esophageal reflux disease without esophagitis: Secondary | ICD-10-CM | POA: Insufficient documentation

## 2014-08-16 DIAGNOSIS — R079 Chest pain, unspecified: Secondary | ICD-10-CM | POA: Diagnosis present

## 2014-08-16 DIAGNOSIS — Z8742 Personal history of other diseases of the female genital tract: Secondary | ICD-10-CM | POA: Insufficient documentation

## 2014-08-16 LAB — CBC
HCT: 40.2 % (ref 36.0–46.0)
Hemoglobin: 12.8 g/dL (ref 12.0–15.0)
MCH: 26.4 pg (ref 26.0–34.0)
MCHC: 31.8 g/dL (ref 30.0–36.0)
MCV: 83.1 fL (ref 78.0–100.0)
PLATELETS: 305 10*3/uL (ref 150–400)
RBC: 4.84 MIL/uL (ref 3.87–5.11)
RDW: 13.5 % (ref 11.5–15.5)
WBC: 10.5 10*3/uL (ref 4.0–10.5)

## 2014-08-16 LAB — BASIC METABOLIC PANEL
Anion gap: 13 (ref 5–15)
BUN: 13 mg/dL (ref 6–20)
CHLORIDE: 103 mmol/L (ref 101–111)
CO2: 22 mmol/L (ref 22–32)
CREATININE: 0.68 mg/dL (ref 0.44–1.00)
Calcium: 9.3 mg/dL (ref 8.9–10.3)
GFR calc Af Amer: >60 mL/min (ref >60)
GFR calc non Af Amer: >60 mL/min (ref >60)
GLUCOSE: 166 mg/dL — AB (ref 65–99)
POTASSIUM: 3.4 mmol/L — AB (ref 3.5–5.1)
Sodium: 138 mmol/L (ref 135–145)

## 2014-08-16 LAB — I-STAT TROPONIN, ED
TROPONIN I, POC: 0 ng/mL (ref 0.00–0.08)
Troponin i, poc: 0 ng/mL (ref 0.00–0.08)

## 2014-08-16 LAB — HEPATIC FUNCTION PANEL
ALT: 16 U/L (ref 14–54)
AST: 18 U/L (ref 15–41)
Albumin: 4 g/dL (ref 3.5–5.0)
Alkaline Phosphatase: 76 U/L (ref 38–126)
Bilirubin, Direct: 0.1 mg/dL (ref 0.1–0.5)
Indirect Bilirubin: 0.5 mg/dL (ref 0.3–0.9)
Total Bilirubin: 0.6 mg/dL (ref 0.3–1.2)
Total Protein: 8 g/dL (ref 6.5–8.1)

## 2014-08-16 LAB — CBG MONITORING, ED: Glucose-Capillary: 163 mg/dL — ABNORMAL HIGH (ref 65–99)

## 2014-08-16 LAB — LIPASE, BLOOD: LIPASE: 27 U/L (ref 22–51)

## 2014-08-16 MED ORDER — FENTANYL CITRATE (PF) 100 MCG/2ML IJ SOLN
50.0000 ug | Freq: Once | INTRAMUSCULAR | Status: AC
  Administered 2014-08-16: 50 ug via INTRAVENOUS
  Filled 2014-08-16: qty 2

## 2014-08-16 MED ORDER — HYDROCODONE-ACETAMINOPHEN 5-325 MG PO TABS: 2.0000 | ORAL_TABLET | ORAL | Status: DC | PRN

## 2014-08-16 MED ORDER — ONDANSETRON HCL 4 MG PO TABS
4.0000 mg | ORAL_TABLET | Freq: Four times a day (QID) | ORAL | Status: DC
Start: 2014-08-16 — End: 2014-08-17

## 2014-08-16 MED ORDER — ONDANSETRON HCL 4 MG/2ML IJ SOLN
4.0000 mg | Freq: Once | INTRAMUSCULAR | Status: AC
  Administered 2014-08-16: 4 mg via INTRAVENOUS
  Filled 2014-08-16: qty 2

## 2014-08-16 MED ORDER — OXYCODONE-ACETAMINOPHEN 5-325 MG PO TABS: 2.0000 | ORAL_TABLET | ORAL | Status: DC | PRN

## 2014-08-16 MED ORDER — ASPIRIN 325 MG PO TABS
325.0000 mg | ORAL_TABLET | Freq: Once | ORAL | Status: AC
  Administered 2014-08-16: 325 mg via ORAL
  Filled 2014-08-16: qty 1

## 2014-08-16 MED ORDER — PANTOPRAZOLE SODIUM 40 MG IV SOLR
40.0000 mg | Freq: Once | INTRAVENOUS | Status: AC
  Administered 2014-08-16: 40 mg via INTRAVENOUS
  Filled 2014-08-16: qty 40

## 2014-08-16 NOTE — ED Notes (Signed)
Notified lab to add on HFP

## 2014-08-16 NOTE — ED Provider Notes (Signed)
CSN: [id number]     Arrival date & time 08/16/14  0807 History   First MD Initiated Contact with Patient 08/16/14 [id number]     Chief Complaint  Patient presents with  . Chest Pain     (Consider location/radiation/quality/duration/timing/severity/associated sxs/prior Treatment) HPI Karen Barnes is a 54 y.o. female with history of PACs, hyperlipidemia, diabetes, GERD, family history of early coronary disease, comes in for evaluation of chest and epigastric discomfort. Patient states she has had a burning sensation in her chest since Monday and has been intermittent. She attributes this to eating spicy spaghetti on Monday. She has experienced some relief with antiacids. She reports swimming in the pool yesterday which relieved her symptoms. She reports when she returned home, after dinner at approximately 7 PM she had intense epigastric burning, belching with nausea but no vomiting. She reports waking up this morning still having the burning discomfort and has become increasingly nauseous. Denies any respiratory symptoms, diaphoresis, numbness or weakness.  Past Medical History  Diagnosis Date  . Palpitations     PACs sees cards   . Diabetes mellitus     A1C 6.2---->  2009  . Asthma     sees Dr Beatriz StallionBardellas  . GERD (gastroesophageal reflux disease)   . Hot flashes   . Hyperlipidemia   . Normal nuclear stress test 2006  . Fibrocystic breast   . Recurrent cystitis     took  postcoital antibiotic for years, symptoms better after her hysterectomy 1996, symptoms re-surface in 2011  . History of hiatal hernia   . Wears glasses   . Family history of adverse reaction to anesthesia     sister had cardiac arrest with rhinoplasty   Past Surgical History  Procedure Laterality Date  . Cataract extraction Bilateral 2011  . Cesarean section    . Abdominal hysterectomy  1996    due to endometriosis and fibroids approximately 1996  . Dilation and curettage of uterus    . Lipoma excision  1978  .  Colonoscopy    . Upper gastrointestinal endoscopy    . Breast lumpectomy with radioactive seed localization Right 03/12/2014    Procedure: BREAST LUMPECTOMY WITH RADIOACTIVE SEED LOCALIZATION;  Surgeon: Harriette Bouillonhomas Cornett, MD;  Location: [facility];  Service: General;  Laterality: Right;  . Breast surgery      Lumpectomy-- BENIGN   . Spirometry  2016    Dr. Irena CordsVan Winkle   Family History  Problem Relation Age of Onset  . Mitral valve prolapse Mother   . Heart disease Father     early   . Diabetes Father   . Breast cancer Mother     M had ca insitu (age 56), aunt   . Colon cancer Neg Hx   . Brain cancer Other     2 fam members   . Lung cancer Other     uncle, heavy smoker   History  Substance Use Topics  . Smoking status: Never Smoker   . Smokeless tobacco: Never Used  . Alcohol Use: No   OB History    No data available     Review of Systems A 10 point review of systems was completed and was negative except for pertinent positives and negatives as mentioned in the history of present illness     Allergies  Butorphanol tartrate; Hydrocodone; Roxicet; and Penicillins  Home Medications   Prior to Admission medications   Medication Sig Start Date End Date Taking? Authorizing Provider  albuterol (PROVENTIL  HFA;VENTOLIN HFA) 108 (90 BASE) MCG/ACT inhaler Inhale 2 puffs into the lungs every 6 (six) hours as needed.   Yes Historical Provider, MD  atenolol (TENORMIN) 50 MG tablet Take 1 tablet (50 mg total) by mouth daily. 06/22/14  Yes Dyann Kief, PA-C  beclomethasone (QVAR) 80 MCG/ACT inhaler Inhale 2 puffs into the lungs 2 (two) times daily as needed (FOR ASTHMA FLARES). As needed   Yes Historical Provider, MD  bifidobacterium infantis (ALIGN) capsule Take 1 capsule by mouth daily.   Yes Historical Provider, MD  fexofenadine (ALLEGRA) 180 MG tablet Take 180 mg by mouth every morning.    Yes Historical Provider, MD  fluticasone (FLONASE) 50 MCG/ACT nasal spray  Place 2 sprays into the nose daily.     Yes Historical Provider, MD  hydrocortisone 2.5 % cream Apply topically 2 (two) times daily. Patient not taking: Reported on 08/17/2014 03/20/14  Yes Wanda Plump, MD  JANUVIA 100 MG tablet TAKE 1 TABLET BY MOUTH DAILY Patient not taking: Reported on 08/17/2014 04/15/14  Yes Wanda Plump, MD  metFORMIN (GLUCOPHAGE) 1000 MG tablet TAKE 1 TABLET BY MOUTH TWICE DAILY WITH A MEAL 10/17/13  Yes Wanda Plump, MD  montelukast (SINGULAIR) 10 MG tablet Take 10 mg by mouth at bedtime.     Yes Historical Provider, MD  Multiple Vitamins-Minerals (MULTIVITAMIN WITH MINERALS) tablet Take 1 tablet by mouth daily.   Yes Historical Provider, MD  PREMARIN vaginal cream INSERT 1 APPLICATORFUL PER VAGINA  TWICE WEEKLY ON TUESDAYS AND FRIDAYS 07/10/14  Yes Historical Provider, MD  rosuvastatin (CRESTOR) 5 MG tablet Take 1 tablet (5 mg total) by mouth daily. 06/22/14  Yes Dyann Kief, PA-C  glucose blood test strip Check blood sugar no more than twice daily. 08/17/14   Wanda Plump, MD  Lancets [facility] ULTRASOFT) lancets Check blood sugar no more than twice daily. 08/17/14   Wanda Plump, MD  nitrofurantoin, macrocrystal-monohydrate, (MACROBID) 100 MG capsule TAKE AS DIRECTED, PRN 06/08/14   Wanda Plump, MD  omeprazole (PRILOSEC) 40 MG capsule Take 1 capsule (40 mg total) by mouth 2 (two) times daily. 08/17/14   Wanda Plump, MD  ondansetron (ZOFRAN) 4 MG tablet Take 1 tablet (4 mg total) by mouth every 6 (six) hours. 08/17/14   Wanda Plump, MD  promethazine (PHENERGAN) 12.5 MG tablet Take 1 tablet (12.5 mg total) by mouth every 8 (eight) hours as needed for nausea. Patient not taking: Reported on 08/17/2014 08/18/11 06/22/14  Wanda Plump, MD  traMADol (ULTRAM) 50 MG tablet Take 1 tablet (50 mg total) by mouth every 8 (eight) hours as needed. 08/17/14   Wanda Plump, MD   BP 146/73 mmHg  Pulse 72  Temp(Src) 98.3 F (36.8 C) (Oral)  Resp 23  SpO2 94% Physical Exam  Constitutional: She is oriented to  person, place, and time. She appears well-developed and well-nourished.  HENT:  Head: Normocephalic and atraumatic.  Mouth/Throat: Oropharynx is clear and moist.  Eyes: Conjunctivae are normal. Pupils are equal, round, and reactive to light. Right eye exhibits no discharge. Left eye exhibits no discharge. No scleral icterus.  Neck: Neck supple.  Cardiovascular: Normal rate, regular rhythm and normal heart sounds.   Pulmonary/Chest: Effort normal and breath sounds normal. No respiratory distress. She has no wheezes. She has no rales.  Abdominal: Soft. There is no tenderness.  Tenderness to palpation in epigastric area. Abdomen otherwise soft, nondistended without rebound or guarding. No other lesions or  deformities noted.  Musculoskeletal: She exhibits no tenderness.  Neurological: She is alert and oriented to person, place, and time.  Cranial Nerves II-XII grossly intact  Skin: Skin is warm and dry. No rash noted.  Psychiatric: She has a normal mood and affect.  Nursing note and vitals reviewed.   ED Course  Procedures (including critical care time) Labs Review Labs Reviewed  BASIC METABOLIC PANEL - Abnormal; Notable for the following:    Potassium 3.4 (*)    Glucose, Bld 166 (*)    All other components within normal limits  CBG MONITORING, ED - Abnormal; Notable for the following:    Glucose-Capillary 163 (*)    All other components within normal limits  CBC  LIPASE, BLOOD  HEPATIC FUNCTION PANEL  I-STAT TROPOININ, ED  I-STAT TROPOININ, ED    Imaging Review No results found.   EKG Interpretation   Date/Time:  Sunday August 16 2014 08:15:47 EDT Ventricular Rate:  78 PR Interval:  158 QRS Duration: 77 QT Interval:  416 QTC Calculation: 474 R Axis:   71 Text Interpretation:  Sinus rhythm Low voltage, precordial leads ED  PHYSICIAN INTERPRETATION AVAILABLE IN [facility] Confirmed by TEST,  Record ([id number]) on 08/17/2014 7:03:02 AM     Meds given in  ED:  Medications  aspirin tablet 325 mg (325 mg Oral Given 08/16/14 0836)  fentaNYL (SUBLIMAZE) injection 50 mcg (50 mcg Intravenous Given 08/16/14 1007)  ondansetron (ZOFRAN) injection 4 mg (4 mg Intravenous Given 08/16/14 1007)  pantoprazole (PROTONIX) injection 40 mg (40 mg Intravenous Given 08/16/14 1007)  fentaNYL (SUBLIMAZE) injection 50 mcg (50 mcg Intravenous Given 08/16/14 1319)    Discharge Medication List as of 08/16/2014  1:52 PM    START taking these medications   Details  ondansetron (ZOFRAN) 4 MG tablet Take 1 tablet (4 mg total) by mouth every 6 (six) hours., Starting 08/16/2014, Until Discontinued, Print    oxyCODONE-acetaminophen (PERCOCET/ROXICET) 5-325 MG per tablet Take 2 tablets by mouth every 4 (four) hours as needed for severe pain., Starting 08/16/2014, Until Discontinued, Print       Filed Vitals:   08/16/14 1300 08/16/14 1315 08/16/14 1330 08/16/14 1345  BP: 146/73     Pulse: 73 72 71 72  Temp:      TempSrc:      Resp: SpO2: 96% 98% 96% 94%    MDM  Vitals stable - WNL -afebrile Pt resting comfortably in ED. PE--midepigastric tenderness. Physical exam is otherwise benign. Labwork--labs appear to be baseline and noncontributory. Delta Troponin negative. EKG reassuring Imaging--chest x-ray shows no acute cardio pulmonary pathology. Ultrasound of abdomen is unremarkable. I personally reviewed the imaging and agree with the results as interpreted by the radiologist.  DDX--patient's symptoms likely secondary to GI. No evidence of cardiac or pulmonary etiology. Lipase, LFTs are baseline. No evidence of acute cholecystitis, pancreatitis, obstruction, perforation or other acute intra-abdominal pathology. DC with antinausea medicines as well as short course pain medicines and encouraged patient to follow-up with primary care.  I discussed all relevant lab findings and imaging results with pt and they verbalized understanding. Discussed f/u with PCP  within 48 hrs and return precautions, pt very amenable to plan. Prior to patient discharge, I discussed and reviewed this case with Dr. Estell Harpin, who also saw and evaluated the patient.   Final diagnoses:  Epigastric pain       Joycie Peek, PA-C 08/18/14 3086  Bethann Berkshire, MD 08/18/14 [phone]

## 2014-08-16 NOTE — ED Notes (Signed)
Pt with Hx of diabetes and small hiatal hernia c/o medial epigastric pain and pressure, oliguria, nausea with no emesis, . Pt states this pain is different from her normal GERD, pt states she is on NigeriaGenuvia and Atenolol.

## 2014-08-16 NOTE — Discharge Instructions (Signed)
You were evaluated in the ED today for your epigastric pain. There is not appear to be an emergent cause for your symptoms at this time. It is important to follow-up with your doctor/GI doctor for further evaluation and management of your symptoms. Take your medications as prescribed. Return to ED for worsening symptoms.  Abdominal Pain Many things can cause abdominal pain. Usually, abdominal pain is not caused by a disease and will improve without treatment. It can often be observed and treated at home. Your health care provider will do a physical exam and possibly order blood tests and X-rays to help determine the seriousness of your pain. However, in many cases, more time must pass before a clear cause of the pain can be found. Before that point, your health care provider may not know if you need more testing or further treatment. HOME CARE INSTRUCTIONS  Monitor your abdominal pain for any changes. The following actions may help to alleviate any discomfort you are experiencing:  Only take over-the-counter or prescription medicines as directed by your health care provider.  Do not take laxatives unless directed to do so by your health care provider.  Try a clear liquid diet (broth, tea, or water) as directed by your health care provider. Slowly move to a bland diet as tolerated. SEEK MEDICAL CARE IF:  You have unexplained abdominal pain.  You have abdominal pain associated with nausea or diarrhea.  You have pain when you urinate or have a bowel movement.  You experience abdominal pain that wakes you in the night.  You have abdominal pain that is worsened or improved by eating food.  You have abdominal pain that is worsened with eating fatty foods.  You have a fever. SEEK IMMEDIATE MEDICAL CARE IF:   Your pain does not go away within 2 hours.  You keep throwing up (vomiting).  Your pain is felt only in portions of the abdomen, such as the right side or the left lower portion of the  abdomen.  You pass bloody or black tarry stools. MAKE SURE YOU:  Understand these instructions.   Will watch your condition.   Will get help right away if you are not doing well or get worse.  Document Released: 11/02/2004 Document Revised: 01/28/2013 Document Reviewed: 10/02/2012 [facility] Patient Information 2015 [employer] [place]. This information is not intended to replace advice given to you by your health care provider. Make sure you discuss any questions you have with your health care provider.

## 2014-08-17 ENCOUNTER — Ambulatory Visit (INDEPENDENT_AMBULATORY_CARE_PROVIDER_SITE_OTHER): Payer: BC Managed Care – PPO | Admitting: Internal Medicine

## 2014-08-17 ENCOUNTER — Telehealth: Payer: Self-pay | Admitting: Internal Medicine

## 2014-08-17 ENCOUNTER — Encounter: Payer: Self-pay | Admitting: Internal Medicine

## 2014-08-17 VITALS — BP 130/68 | HR 72 | Temp 97.7°F | Ht 64.0 in | Wt 203.0 lb

## 2014-08-17 DIAGNOSIS — R1013 Epigastric pain: Secondary | ICD-10-CM | POA: Diagnosis not present

## 2014-08-17 DIAGNOSIS — E119 Type 2 diabetes mellitus without complications: Secondary | ICD-10-CM | POA: Diagnosis not present

## 2014-08-17 MED ORDER — TRAMADOL HCL 50 MG PO TABS: 50.0000 mg | ORAL_TABLET | Freq: Three times a day (TID) | ORAL | Status: DC | PRN

## 2014-08-17 MED ORDER — GLUCOSE BLOOD VI STRP: ORAL_STRIP | Status: DC

## 2014-08-17 MED ORDER — ONETOUCH ULTRASOFT LANCETS MISC: Status: DC

## 2014-08-17 MED ORDER — OMEPRAZOLE 40 MG PO CPDR: 40.0000 mg | DELAYED_RELEASE_CAPSULE | Freq: Two times a day (BID) | ORAL | Status: DC

## 2014-08-17 MED ORDER — ONDANSETRON HCL 4 MG PO TABS: 4.0000 mg | ORAL_TABLET | Freq: Four times a day (QID) | ORAL | Status: DC

## 2014-08-17 NOTE — Progress Notes (Signed)
Pre visit review using our clinic review tool, if applicable. No additional management support is needed unless otherwise documented below in the visit note. 

## 2014-08-17 NOTE — Telephone Encounter (Signed)
Roxicet and Hydrocodone already on Pt's allergy list. Forwarded to Dr. Drue NovelPaz.

## 2014-08-17 NOTE — Telephone Encounter (Signed)
[facility]. She has stopped her Venezuel[place]uvia since pancreatitis is a possible side effect. Pt notes she was prescribed roxicet originally and cannot take it and then prescribed hydrocodone. She said that she went ahead and filled it even though she's had a bad reaction to it before. She took 1/2 dose and said she had the same reaction. She wants chart noted not to prescribe either roxicet or hydrocodone. Pt scheduled for 2:30pm today 08/17/14.

## 2014-08-17 NOTE — Telephone Encounter (Signed)
Okay, will wait until she is seen

## 2014-08-17 NOTE — Patient Instructions (Addendum)
Please call your stomach doctor Call if the symptoms are severe, fever, chills, blood in the stools or not gradually improving

## 2014-08-17 NOTE — Progress Notes (Signed)
Subjective:    Patient ID: Karen CottonBeverly B Bielicki, female    DOB: [birth year], 54 y.o.   MRN: [id number]  DOS:  08/17/2014 Type of visit - description : acute, ER f/u Interval history: Went to the Er w/ 1 week h/o epigastric pain, steady and mild, sx worsen 2 days ago, become sharp and even more intense with deep breaths. Somewhat worse with eating. Radiates to the RUQ. Went to the ER after she didn't improve with Tylenol. Had a CMP, CBC, troponins, ultrasound and a chest x-ray: All were okay. She is here today b/c is no better, she is now sore on the epigastric area. Has been able to eat but very little and is trying to follow a bland diet. + Nausea and burping significantly.    Review of Systems  No fever, + chills w/ the pain, no wt loss except for the last 3 days No vomiting, no blood in the stools. When asked about diarrhea, she reports on and off loose stools for the last 3-4 months. She also admits a lot of gas (flatus) on and off, more lately. Has not been taking Motrin  Past Medical History  Diagnosis Date  . Palpitations     PACs sees cards   . Diabetes mellitus     A1C 6.2---->  2009  . Asthma     sees Dr Beatriz StallionBardellas  . GERD (gastroesophageal reflux disease)   . Hot flashes   . Hyperlipidemia   . Normal nuclear stress test 2006  . Fibrocystic breast   . Recurrent cystitis     took  postcoital antibiotic for years, symptoms better after her hysterectomy 1996, symptoms re-surface in 2011  . History of hiatal hernia   . Wears glasses   . Family history of adverse reaction to anesthesia     sister had cardiac arrest with rhinoplasty    Past Surgical History  Procedure Laterality Date  . Cataract extraction Bilateral 2011  . Cesarean section    . Abdominal hysterectomy  1996    due to endometriosis and fibroids approximately 1996  . Dilation and curettage of uterus    . Lipoma excision  1978  . Colonoscopy    . Upper gastrointestinal endoscopy    . Breast  lumpectomy with radioactive seed localization Right 03/12/2014    Procedure: BREAST LUMPECTOMY WITH RADIOACTIVE SEED LOCALIZATION;  Surgeon: Harriette Bouillonhomas Cornett, MD;  Location: [facility];  Service: General;  Laterality: Right;  . Breast surgery      Lumpectomy-- BENIGN   . Spirometry  2016    Dr. Irena CordsVan Winkle    History   Social History  . Marital Status: Married    Spouse Name: N/A  . Number of Children: 1  . Years of Education: N/A   Occupational History  . retired principal    Social History Main Topics  . Smoking status: Never Smoker   . Smokeless tobacco: Never Used  . Alcohol Use: No  . Drug Use: No  . Sexual Activity: Yes   Other Topics Concern  . Not on file   Social History Narrative   Household-- pt and husband         Medication List       This list is accurate as of: 08/17/14  6:22 PM.  Always use your most recent med list.               albuterol 108 (90 BASE) MCG/ACT inhaler  Commonly known as:  PROVENTIL  HFA;VENTOLIN HFA  Inhale 2 puffs into the lungs every 6 (six) hours as needed.     atenolol 50 MG tablet  Commonly known as:  TENORMIN  Take 1 tablet (50 mg total) by mouth daily.     beclomethasone 80 MCG/ACT inhaler  Commonly known as:  QVAR  Inhale 2 puffs into the lungs 2 (two) times daily as needed (FOR ASTHMA FLARES). As needed     bifidobacterium infantis capsule  Take 1 capsule by mouth daily.     fexofenadine 180 MG tablet  Commonly known as:  ALLEGRA  Take 180 mg by mouth every morning.     fluticasone 50 MCG/ACT nasal spray  Commonly known as:  FLONASE  Place 2 sprays into the nose daily.     glucose blood test strip  Check blood sugar no more than twice daily.     hydrocortisone 2.5 % cream  Apply topically 2 (two) times daily.     JANUVIA 100 MG tablet  Generic drug:  sitaGLIPtin  TAKE 1 TABLET BY MOUTH DAILY     metFORMIN 1000 MG tablet  Commonly known as:  GLUCOPHAGE  TAKE 1 TABLET BY MOUTH TWICE DAILY  WITH A MEAL     montelukast 10 MG tablet  Commonly known as:  SINGULAIR  Take 10 mg by mouth at bedtime.     multivitamin with minerals tablet  Take 1 tablet by mouth daily.     nitrofurantoin (macrocrystal-monohydrate) 100 MG capsule  Commonly known as:  MACROBID  TAKE AS DIRECTED, PRN     omeprazole 40 MG capsule  Commonly known as:  PRILOSEC  Take 1 capsule (40 mg total) by mouth 2 (two) times daily.     ondansetron 4 MG tablet  Commonly known as:  ZOFRAN  Take 1 tablet (4 mg total) by mouth every 6 (six) hours.     onetouch ultrasoft lancets  Check blood sugar no more than twice daily.     PREMARIN vaginal cream  Generic drug:  conjugated estrogens  INSERT 1 APPLICATORFUL PER VAGINA  TWICE WEEKLY ON TUESDAYS AND FRIDAYS     promethazine 12.5 MG tablet  Commonly known as:  PHENERGAN  Take 1 tablet (12.5 mg total) by mouth every 8 (eight) hours as needed for nausea.     rosuvastatin 5 MG tablet  Commonly known as:  CRESTOR  Take 1 tablet (5 mg total) by mouth daily.     traMADol 50 MG tablet  Commonly known as:  ULTRAM  Take 1 tablet (50 mg total) by mouth every 8 (eight) hours as needed.           Objective:   Physical Exam BP 130/68 mmHg  Pulse 72  Temp(Src) 97.7 F (36.5 C) (Oral)  Ht 5\' 4"  (1.626 m)  Wt 203 lb (92.08 kg)  BMI 34.83 kg/m2  SpO2 98% General:   Well developed, well nourished . NAD.  HEENT:  Normocephalic . Face symmetric, atraumatic Lungs:  CTA B Normal respiratory effort, no intercostal retractions, no accessory muscle use. Heart: RRR,  no murmur.  no pretibial edema bilaterally  Abdomen:  Not distended, soft,  Tender at the epigastriq and RUQ area w/o rebound or rigidity. No mass,organomegaly. Mild tenderness at the lower abdomen bilaterally. Skin: Not pale. Not jaundice Neurologic:  alert & oriented X3.  Speech normal, gait appropriate for age and unassisted Psych--  Cognition and judgment appear intact.  Cooperative with  normal attention span and concentration.  Behavior appropriate. No  anxious or depressed appearing.       Assessment & Plan:    One-week history of what seems to his severe epigastric pain. Workup at the ER yesterday reassuring. DDX includes gastritis, peptic ulcer disease, H. pylori infection, gallbladder disease versus other. Plan: Increase Prilosec to twice a day, Zofran as needed for nausea, will refer to Dr. Glade Nurse GI. Requests something for pain, she has multiple intolerances, no history of intolerance to tramadol, will prescribe that medication. See instructions   DM: CBGs slightly elevated but she self discontinue medications, felt that maybe Januvia was causing pancreatitis. Lipase at the ER normal, recommend to reassume medicines.

## 2014-09-07 DIAGNOSIS — K449 Diaphragmatic hernia without obstruction or gangrene: Secondary | ICD-10-CM

## 2014-09-07 HISTORY — DX: Diaphragmatic hernia without obstruction or gangrene: K44.9

## 2014-09-11 HISTORY — PX: UPPER GI ENDOSCOPY: SHX6162

## 2014-09-16 ENCOUNTER — Other Ambulatory Visit: Payer: Self-pay

## 2014-09-18 ENCOUNTER — Ambulatory Visit (INDEPENDENT_AMBULATORY_CARE_PROVIDER_SITE_OTHER): Payer: BC Managed Care – PPO | Admitting: Internal Medicine

## 2014-09-18 ENCOUNTER — Encounter: Payer: Self-pay | Admitting: Internal Medicine

## 2014-09-18 VITALS — BP 118/74 | HR 72 | Temp 98.2°F | Ht 64.0 in | Wt 202.2 lb

## 2014-09-18 DIAGNOSIS — Z114 Encounter for screening for human immunodeficiency virus [HIV]: Secondary | ICD-10-CM

## 2014-09-18 DIAGNOSIS — Z23 Encounter for immunization: Secondary | ICD-10-CM | POA: Diagnosis not present

## 2014-09-18 DIAGNOSIS — Z1159 Encounter for screening for other viral diseases: Secondary | ICD-10-CM

## 2014-09-18 DIAGNOSIS — E119 Type 2 diabetes mellitus without complications: Secondary | ICD-10-CM | POA: Diagnosis not present

## 2014-09-18 LAB — HM DIABETES FOOT EXAM

## 2014-09-18 LAB — HEMOGLOBIN A1C
Hgb A1c MFr Bld: 7.1 % — ABNORMAL HIGH (ref <5.7)
Mean Plasma Glucose: 157 mg/dL — ABNORMAL HIGH (ref <117)

## 2014-09-18 NOTE — Progress Notes (Signed)
Pre visit review using our clinic review tool, if applicable. No additional management support is needed unless otherwise documented below in the visit note. 

## 2014-09-18 NOTE — Progress Notes (Signed)
Subjective:    Patient ID: Karen Barnes, female    DOB: Jun 08, 1960, 54 y.o.   MRN: [id number]  DOS:  09/18/2014 Type of visit - description : Routine visit Interval history: Diabetes, good compliance w/ medication, ambulatory blood sugars mostly in the 130, 140. GERD, saw GI, had endoscopy. Symptoms have definitely improved, still more sensitive than before to caffeine and sosas. UTI: Good compliance with medication, takes antibiotics only as needed.    Review of Systems  Denies chest pain, difficulty breathing. No nausea, vomiting, diarrhea. No dysuria, gross hematuria difficulty urinating  Past Medical History  Diagnosis Date  . Palpitations     PACs sees cards   . Diabetes mellitus     A1C 6.2---->  2009  . Asthma     sees Dr Irena Cords  . GERD (gastroesophageal reflux disease)   . Hot flashes   . Hyperlipidemia   . Normal nuclear stress test 2006  . Fibrocystic breast   . Recurrent cystitis     took  postcoital antibiotic for years, symptoms better after her hysterectomy 1996, symptoms re-surface in 2011  . History of hiatal hernia   . Wears glasses   . Family history of adverse reaction to anesthesia     sister had cardiac arrest with rhinoplasty  . RUQ abdominal pain 2016    Plan is EGD with Dr. Ewing Schlein    Past Surgical History  Procedure Laterality Date  . Cataract extraction Bilateral 2011  . Cesarean section    . Abdominal hysterectomy  1996    due to endometriosis and fibroids approximately 1996  . Dilation and curettage of uterus    . Lipoma excision  1978  . Colonoscopy    . Upper gastrointestinal endoscopy    . Breast lumpectomy with radioactive seed localization Right 03/12/2014    Procedure: BREAST LUMPECTOMY WITH RADIOACTIVE SEED LOCALIZATION;  Surgeon: Harriette Bouillon, MD;  Location: [facility];  Service: General;  Laterality: Right;  . Breast surgery      Lumpectomy-- BENIGN   . Spirometry  2016    Dr. Irena Cords    Social  History   Social History  . Marital Status: Married    Spouse Name: N/A  . Number of Children: 1  . Years of Education: N/A   Occupational History  . retired principal    Social History Main Topics  . Smoking status: Never Smoker   . Smokeless tobacco: Never Used  . Alcohol Use: No  . Drug Use: No  . Sexual Activity: Yes   Other Topics Concern  . Not on file   Social History Narrative   Household-- pt and husband         Medication List       This list is accurate as of: 09/18/14  2:46 PM.  Always use your most recent med list.               albuterol 108 (90 BASE) MCG/ACT inhaler  Commonly known as:  PROVENTIL HFA;VENTOLIN HFA  Inhale 2 puffs into the lungs every 6 (six) hours as needed.     atenolol 50 MG tablet  Commonly known as:  TENORMIN  Take 1 tablet (50 mg total) by mouth daily.     beclomethasone 80 MCG/ACT inhaler  Commonly known as:  QVAR  Inhale 2 puffs into the lungs 2 (two) times daily as needed (FOR ASTHMA FLARES). As needed     bifidobacterium infantis capsule  Take  1 capsule by mouth daily.     fexofenadine 180 MG tablet  Commonly known as:  ALLEGRA  Take 180 mg by mouth every morning.     fluticasone 50 MCG/ACT nasal spray  Commonly known as:  FLONASE  Place 2 sprays into the nose daily.     glucose blood test strip  Check blood sugar no more than twice daily.     JANUVIA 100 MG tablet  Generic drug:  sitaGLIPtin  TAKE 1 TABLET BY MOUTH DAILY     metFORMIN 1000 MG tablet  Commonly known as:  GLUCOPHAGE  TAKE 1 TABLET BY MOUTH TWICE DAILY WITH A MEAL     montelukast 10 MG tablet  Commonly known as:  SINGULAIR  Take 10 mg by mouth at bedtime.     multivitamin with minerals tablet  Take 1 tablet by mouth daily.     nitrofurantoin (macrocrystal-monohydrate) 100 MG capsule  Commonly known as:  MACROBID  TAKE AS DIRECTED, PRN     omeprazole 40 MG capsule  Commonly known as:  PRILOSEC  Take 1 capsule (40 mg total) by mouth  2 (two) times daily.     onetouch ultrasoft lancets  Check blood sugar no more than twice daily.     PREMARIN vaginal cream  Generic drug:  conjugated estrogens  INSERT 1 APPLICATORFUL PER VAGINA  TWICE WEEKLY ON TUESDAYS AND FRIDAYS     rosuvastatin 5 MG tablet  Commonly known as:  CRESTOR  Take 1 tablet (5 mg total) by mouth daily.           Objective:   Physical Exam BP 118/74 mmHg  Pulse 72  Temp(Src) 98.2 F (36.8 C) (Oral)  Ht  (1.626 m)  Wt 202 lb 4 oz (91.74 kg)  BMI 34.70 kg/m2  SpO2 94% General:   Well developed, well nourished . NAD.  HEENT:  Normocephalic . Face symmetric, atraumatic Lungs:  CTA B Normal respiratory effort, no intercostal retractions, no accessory muscle use. Heart: RRR,  no murmur.  No pretibial edema bilaterally  Diabetic feet exam: No edema, good pedal pulses, pinprick examination normal, skin normal. Neurologic:  alert & oriented X3.  Speech normal, gait appropriate for age and unassisted Psych--  Cognition and judgment appear intact.  Cooperative with normal attention span and concentration.  Behavior appropriate. No anxious or depressed appearing.      Assessment & Plan:

## 2014-09-18 NOTE — Assessment & Plan Note (Signed)
Foot exam today negative, has an appointment to see ophthalmology soon. Continue metformin, Januvia, check A1c and microalbumin

## 2014-09-18 NOTE — Patient Instructions (Signed)
Get your blood work before you leave    Get a flu shot this fall

## 2014-09-18 NOTE — Assessment & Plan Note (Signed)
Was seen a few weeks ago with severe epigastric pain, subsequently saw GI, had a EGD, eventually diagnosed with esophageal spasms. Still taking omeprazole 40 twice a day, recommend to decrease to 1 daily and take a second dose only as needed.

## 2014-09-19 LAB — HIV ANTIBODY (ROUTINE TESTING W REFLEX): HIV: NONREACTIVE

## 2014-09-19 LAB — HEPATITIS C ANTIBODY: HCV Ab: NEGATIVE

## 2014-09-19 LAB — MICROALBUMIN / CREATININE URINE RATIO
Creatinine, Urine: 162.5 mg/dL
Microalb Creat Ratio: 4.3 mg/g (ref 0.0–30.0)
Microalb, Ur: 0.7 mg/dL

## 2014-09-23 ENCOUNTER — Encounter: Payer: Self-pay | Admitting: Internal Medicine

## 2014-09-23 NOTE — Addendum Note (Signed)
Addended by: Dorette Grate on: 09/23/2014 08:14 AM   Modules accepted: Orders

## 2014-09-26 ENCOUNTER — Encounter: Payer: Self-pay | Admitting: Internal Medicine

## 2014-09-29 ENCOUNTER — Telehealth: Payer: Self-pay | Admitting: Internal Medicine

## 2014-09-29 MED ORDER — ONETOUCH DELICA LANCETS 33G MISC: Status: DC

## 2014-09-29 NOTE — Telephone Encounter (Signed)
Pt called in, she says that she actually need the [facility] brand of Lancets instead of the ultra soft.    CB: [phone]

## 2014-09-29 NOTE — Telephone Encounter (Signed)
Delica lancets sent to [facility].

## 2014-10-02 LAB — HM DIABETES EYE EXAM

## 2014-10-05 ENCOUNTER — Other Ambulatory Visit: Payer: Self-pay | Admitting: Internal Medicine

## 2014-10-30 ENCOUNTER — Other Ambulatory Visit: Payer: Self-pay | Admitting: Internal Medicine

## 2014-10-30 ENCOUNTER — Other Ambulatory Visit: Payer: Self-pay | Admitting: Cardiology

## 2014-11-14 ENCOUNTER — Encounter: Payer: Self-pay | Admitting: Internal Medicine

## 2014-12-14 ENCOUNTER — Telehealth: Payer: Self-pay | Admitting: Internal Medicine

## 2014-12-14 NOTE — Telephone Encounter (Signed)
That is okay, why don't we check a A1c one month from today.?

## 2014-12-14 NOTE — Telephone Encounter (Signed)
Pt states she's been on a Prednisone taper twice in the past two months.  First was 10/13/14 x 6 days and second was 11/14/14 x 6 days.  Blood sugars were elevated during the courses of both and patient wants to know if lab appt this Friday should be rescheduled.  Please advise.

## 2014-12-14 NOTE — Telephone Encounter (Signed)
Lab appt rescheduled. No additional needs voiced at this time.

## 2014-12-14 NOTE — Telephone Encounter (Signed)
Caller name: Meriam SpragueBeverly   Relationship to patient: Self   Can be reached: [phone]    Reason for call: pt says that she has had a few bronchitis episodes and has had to take prednisone . Pt has an appt on the 11/11 for labs to check her A1C. Pt would like to make sure that that appt is still necessary due to her intake. She says that the level wouldn't be accurate. Please advise.

## 2014-12-18 ENCOUNTER — Ambulatory Visit: Payer: BC Managed Care – PPO

## 2014-12-18 ENCOUNTER — Other Ambulatory Visit: Payer: BC Managed Care – PPO

## 2014-12-28 ENCOUNTER — Telehealth: Payer: Self-pay | Admitting: Internal Medicine

## 2014-12-28 NOTE — Telephone Encounter (Signed)
Will look out for paperwork.

## 2014-12-28 NOTE — Telephone Encounter (Signed)
Completed as much as possible and forwarded to Dr. Drue NovelPaz. JG//CMA

## 2014-12-28 NOTE — Telephone Encounter (Signed)
Pt's father dropped off paperwork to be filled out.
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
## 2014-12-30 NOTE — Telephone Encounter (Signed)
Notified pt. 

## 2014-12-30 NOTE — Telephone Encounter (Signed)
Pt called for status. She said she was told 24 hours. She would like to have it picked up today. Please call at [phone].

## 2014-12-30 NOTE — Telephone Encounter (Signed)
Our form turnaround time is 5-7 business days. Im not sure who told pt 24 hrs, but that is not accurate information.   Form is currently with Dr. Drue NovelPaz.

## 2015-01-13 NOTE — Telephone Encounter (Signed)
Pt states that she needs the form for worsening asthma which is triggered mostly by environmental changes. I spoke with Dr. Drue NovelPaz and he states he is unable to fill out form based on this information and that pt's allergist/pulmonologist will need to sign it. Called and left detailed message for pt. JG//CMA

## 2015-01-14 ENCOUNTER — Telehealth: Payer: Self-pay | Admitting: Cardiology

## 2015-01-14 DIAGNOSIS — I499 Cardiac arrhythmia, unspecified: Secondary | ICD-10-CM

## 2015-01-14 DIAGNOSIS — R000 Tachycardia, unspecified: Secondary | ICD-10-CM

## 2015-01-14 NOTE — Telephone Encounter (Signed)
F/u ° ° °Pt returning your call. Please call pt. °

## 2015-01-14 NOTE — Telephone Encounter (Signed)
New message  Patient c/o Afib:   1. How long have you been having afib? The last two nights within 5-10 minutes of falling asleep   2. Are you currently experiencing lightheadedness and shortness of breath? No   3. Have you checked your BP and heart rate? (document readings) No   4. Are you experiencing any other symptoms? No    Comments: Pt states that she feels normal just a little tired. Per pt she states that she is going through a stressful time. Her dad has CHF mom has an illness for the last few nights she has had afib. Since then she has gotten back in rythem. She knows that she is going through a stressfull time. Requests a call back to determine if she may need an appt. Please call back to discuss

## 2015-01-14 NOTE — Telephone Encounter (Signed)
Pt confirmed  atrial fibrillation has never been documented for her.  Pt states she has a history of occasional palpitations but symptoms of fast irregular heart rate the last 2 nights felt different. Pt advised I will forward to Dr Shirlee LatchMcLean for review.

## 2015-01-14 NOTE — Telephone Encounter (Signed)
Pt advised Dr Shirlee LatchMcLean has recommended a 48 hour holter monitor.   Pt states she will be bringing her father here for INR/PT tomorrow at 9:45AM and would like to pick up monitor at that time. Pt advised [facility]t.

## 2015-01-14 NOTE — Telephone Encounter (Signed)
Would have her wear 48 hour monitor, see if we can get it today.

## 2015-01-14 NOTE — Telephone Encounter (Signed)
LMTCB

## 2015-01-14 NOTE — Telephone Encounter (Signed)
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  Pt states for the last 2 nights she has felt like she was in at fib, out of rhythm, fast and irregular, shortly after going to bed that lasted about 5-10 minutes.  Pt states BP today 144/82 unsure of heart rate.  Pt states she is at her asthma/allergy doctor now, they have just stepped in to the room, she will call me back.

## 2015-01-15 ENCOUNTER — Encounter: Payer: Self-pay | Admitting: Internal Medicine

## 2015-01-15 ENCOUNTER — Other Ambulatory Visit (INDEPENDENT_AMBULATORY_CARE_PROVIDER_SITE_OTHER): Payer: BC Managed Care – PPO

## 2015-01-15 DIAGNOSIS — E119 Type 2 diabetes mellitus without complications: Secondary | ICD-10-CM | POA: Diagnosis not present

## 2015-01-15 LAB — HEMOGLOBIN A1C: Hgb A1c MFr Bld: 6.8 % — ABNORMAL HIGH (ref 4.6–6.5)

## 2015-01-18 ENCOUNTER — Other Ambulatory Visit: Payer: Self-pay | Admitting: Cardiology

## 2015-01-18 DIAGNOSIS — I499 Cardiac arrhythmia, unspecified: Secondary | ICD-10-CM

## 2015-01-18 DIAGNOSIS — R002 Palpitations: Secondary | ICD-10-CM

## 2015-01-18 DIAGNOSIS — R000 Tachycardia, unspecified: Secondary | ICD-10-CM

## 2015-01-19 ENCOUNTER — Encounter: Payer: Self-pay | Admitting: *Deleted

## 2015-01-19 NOTE — Progress Notes (Signed)
Patient ID: Karen Barnes, female   DOB: 07/27/1960, 54 y.o.   MRN: [id number] Patient did not show up for 01/19/2015 4:00 PM appointment to have a 24 hour holter monitor applied.

## 2015-01-21 ENCOUNTER — Ambulatory Visit (INDEPENDENT_AMBULATORY_CARE_PROVIDER_SITE_OTHER): Payer: BC Managed Care – PPO

## 2015-01-21 ENCOUNTER — Encounter: Payer: Self-pay | Admitting: Internal Medicine

## 2015-01-21 DIAGNOSIS — R000 Tachycardia, unspecified: Secondary | ICD-10-CM

## 2015-01-21 DIAGNOSIS — I499 Cardiac arrhythmia, unspecified: Secondary | ICD-10-CM

## 2015-01-21 DIAGNOSIS — R002 Palpitations: Secondary | ICD-10-CM

## 2015-01-28 ENCOUNTER — Telehealth: Payer: Self-pay | Admitting: Cardiology

## 2015-01-28 NOTE — Telephone Encounter (Signed)
Spoke with pt who is reporting she is thinking about restarting allergy shots and the allergist she saw asked her to call and ask if there is a different type of medications she can take for her palpitations.  She reports the allergist (Dr Madie RenoVanWinkle) states there is an interaction between beta blockers and epi pen and he would want her medication changed if possible.  Advised pt I will forward this information to Dr Shirlee LatchMcLean for review and recommendations.

## 2015-01-28 NOTE — Telephone Encounter (Signed)
Left message to call back and ask to speak with a triage nurse.

## 2015-01-28 NOTE — Telephone Encounter (Signed)
Lm to cb.

## 2015-01-28 NOTE — Telephone Encounter (Signed)
New problem   Pt need to speak to nurse concerning a medication interaction. Her allergy doctor see a concern with her using an epi pen and she taking atenolol. Please advise

## 2015-01-28 NOTE — Telephone Encounter (Signed)
She could stop atenolol and start diltiazem CD 120 mg daily to control palpitations.

## 2015-01-28 NOTE — Telephone Encounter (Signed)
Follow up ° ° ° ° ° °Returning a call to the nurse °

## 2015-01-29 MED ORDER — DILTIAZEM HCL ER COATED BEADS 120 MG PO CP24: 120.0000 mg | ORAL_CAPSULE | Freq: Every day | ORAL | Status: DC

## 2015-01-29 NOTE — Telephone Encounter (Signed)
The patient is aware of Dr. Alford HighlandMcLean's recommendations. She would like to stop atenolol and start diltiazem. [facility] would [facility] at [facility] for #90 day supply.

## 2015-02-03 ENCOUNTER — Other Ambulatory Visit: Payer: Self-pay

## 2015-02-07 ENCOUNTER — Other Ambulatory Visit: Payer: Self-pay | Admitting: Internal Medicine

## 2015-02-10 ENCOUNTER — Telehealth: Payer: Self-pay | Admitting: Cardiology

## 2015-02-10 NOTE — Telephone Encounter (Signed)
New Message   Pt is under a lot of stressed she takes care of 3 people and she feels that she   needs to speak to her RN because she has a slight pain in her chest that she said its from the stress  She has questions about the medication changes that Dr. Shirlee LatchMclean changed

## 2015-02-10 NOTE — Telephone Encounter (Signed)
Pt states that she has had pain in her right shoulder/upper arm/elbow for 3 weeks,  she felt this was due to lifting her  grandchild.  Pt states she woke up with right shoulder pain early this morning. Pt states she took a shower. Pt states she noticed discomfort in the center of her chest, to the right side, before she got in the shower. Pt states she continues to have discomfort in her chest , associated with nausea, tiredness, and weakness.   Pt advised that [facility].  Pt advised not to drive.  Pt verbalized understanding.

## 2015-03-19 ENCOUNTER — Other Ambulatory Visit: Payer: Self-pay | Admitting: Internal Medicine

## 2015-03-19 NOTE — Telephone Encounter (Signed)
Ok to RF x 1

## 2015-03-19 NOTE — Telephone Encounter (Signed)
Rx sent 

## 2015-03-19 NOTE — Telephone Encounter (Signed)
Pt requesting refill on Hydrocortisone 2.5 % cream. No longer on med list. Last OV 09/2014. Please advise.

## 2015-03-26 ENCOUNTER — Encounter: Payer: Self-pay | Admitting: Internal Medicine

## 2015-03-26 ENCOUNTER — Ambulatory Visit (INDEPENDENT_AMBULATORY_CARE_PROVIDER_SITE_OTHER): Payer: BC Managed Care – PPO | Admitting: Internal Medicine

## 2015-03-26 VITALS — BP 122/64 | HR 78 | Temp 98.3°F | Ht 64.0 in | Wt 193.1 lb

## 2015-03-26 DIAGNOSIS — E119 Type 2 diabetes mellitus without complications: Secondary | ICD-10-CM

## 2015-03-26 DIAGNOSIS — Z0000 Encounter for general adult medical examination without abnormal findings: Secondary | ICD-10-CM | POA: Diagnosis not present

## 2015-03-26 DIAGNOSIS — M25511 Pain in right shoulder: Secondary | ICD-10-CM

## 2015-03-26 DIAGNOSIS — Z09 Encounter for follow-up examination after completed treatment for conditions other than malignant neoplasm: Secondary | ICD-10-CM | POA: Insufficient documentation

## 2015-03-26 LAB — LIPID PANEL
CHOLESTEROL: 143 mg/dL (ref 0–200)
HDL: 57.9 mg/dL (ref >39.00)
LDL Cholesterol: 71 mg/dL (ref 0–99)
NonHDL: 85.49
Total CHOL/HDL Ratio: 2
Triglycerides: 72 mg/dL (ref 0.0–149.0)
VLDL: 14.4 mg/dL (ref 0.0–40.0)

## 2015-03-26 LAB — BASIC METABOLIC PANEL
BUN: 11 mg/dL (ref 6–23)
CO2: 30 mEq/L (ref 19–32)
CREATININE: 0.67 mg/dL (ref 0.40–1.20)
Calcium: 9.4 mg/dL (ref 8.4–10.5)
Chloride: 101 mEq/L (ref 96–112)
GFR: 96.61 mL/min (ref >60.00)
Glucose, Bld: 130 mg/dL — ABNORMAL HIGH (ref 70–99)
POTASSIUM: 4 meq/L (ref 3.5–5.1)
Sodium: 138 mEq/L (ref 135–145)

## 2015-03-26 LAB — ALT: ALT: 17 U/L (ref 0–35)

## 2015-03-26 LAB — HEMOGLOBIN A1C: HEMOGLOBIN A1C: 6.5 % (ref 4.6–6.5)

## 2015-03-26 LAB — TSH: TSH: 0.6 u[IU]/mL (ref 0.35–4.50)

## 2015-03-26 LAB — AST: AST: 17 U/L (ref 0–37)

## 2015-03-26 NOTE — Assessment & Plan Note (Addendum)
Td 09; zostavax-- discussed before ;pnm 23 09-2014; prevnar due 09-2015 Had a flu shot  cscope ~ 05-17-10 Dr Harrison Mons, repeat 5 years ,aware she is due soon Female care per   Dr Reece Agar. Atkins  -- [facility]-PAPs w/ them dexa neg 2015  Discussed diet, exercise.   Taking one multivitamin a day, she has a calcium rich diet

## 2015-03-26 NOTE — Patient Instructions (Signed)
GO TO THE LAB : Get the blood work    GO TO THE FRONT DESK  Schedule a routine office visit or check up to be done in  6 months  No  fasting   

## 2015-03-26 NOTE — Progress Notes (Signed)
Subjective:    Patient ID: Karen Barnes, female    DOB: January 15, 1961, 55 y.o.   MRN: [id number]  DOS:  03/26/2015 Type of visit - description : CPX Interval history: + stress, taking care of her parents and her g-baby. Little time for herself, not being able to exercise, she continue to eat healthy.    Review of Systems Constitutional: No fever. No chills. No unexplained wt changes. No unusual sweats  HEENT: No dental problems, no ear discharge, no facial swelling, no voice changes. No eye discharge, no eye  redness , no  intolerance to light   Respiratory: No wheezing , no  difficulty breathing. No cough , no mucus production  Cardiovascular: No CP, no leg swelling , no  Palpitations  GI: no nausea, no vomiting, no diarrhea , no  abdominal pain.  No blood in the stools. No dysphagia, no odynophagia    Endocrine: No polyphagia, no polyuria , no polydipsia  GU: No dysuria, gross hematuria, difficulty urinating. No urinary urgency, no frequency.  Musculoskeletal:  Back in November, she had to stretch her right arm really hard catching her 33 pound grandchild, since then has right shoulder pain with certain movements, some radiation to the arm and forearm. Denies neck pain per se or hand paresthesias. Skin: No change in the color of the skin, palor , no  Rash  Allergic, immunologic: No environmental allergies , no  food allergies  Neurological: No dizziness no  syncope. No headaches. No diplopia, no slurred, no slurred speech, no motor deficits, no facial  Numbness  Hematological: No enlarged lymph nodes, no easy bruising , no unusual bleedings  Psychiatry: No suicidal ideas, no hallucinations, no beavior problems, no confusion.    no depression   Past Medical History  Diagnosis Date  . Palpitations     PACs sees cards   . Diabetes mellitus     A1C 6.2---->  2009  . Asthma     sees Dr Irena Cords  . GERD (gastroesophageal reflux disease)   . Hot flashes   .  Hyperlipidemia   . Normal nuclear stress test 2006  . Fibrocystic breast   . Recurrent cystitis     took  postcoital antibiotic for years, symptoms better after her hysterectomy 1996, symptoms re-surface in 2011  . History of hiatal hernia   . Wears glasses   . Family history of adverse reaction to anesthesia     sister had cardiac arrest with rhinoplasty  . RUQ abdominal pain 2016    EGD showed small HH, otherwise normal  . HH (hiatus hernia) 09/2014    Dr. Ewing Schlein    Past Surgical History  Procedure Laterality Date  . Cataract extraction Bilateral 2011  . Cesarean section    . Abdominal hysterectomy  1996    due to endometriosis and fibroids approximately 1996  . Dilation and curettage of uterus    . Lipoma excision  1978  . Colonoscopy    . Upper gastrointestinal endoscopy    . Breast lumpectomy with radioactive seed localization Right 03/12/2014    Procedure: BREAST LUMPECTOMY WITH RADIOACTIVE SEED LOCALIZATION;  Surgeon: Harriette Bouillon, MD;  Location: [facility];  Service: General;  Laterality: Right;  . Breast surgery      Lumpectomy-- BENIGN   . Spirometry  2016    Dr. Irena Cords  . Upper gi endoscopy  09/11/2014    Small Hiatus Hernia, Dr. Ewing Schlein    Social History   Social  History  . Marital Status: Married    Spouse Name: N/A  . Number of Children: 1  . Years of Education: N/A   Occupational History  . retired principal    Social History Main Topics  . Smoking status: Never Smoker   . Smokeless tobacco: Never Used  . Alcohol Use: No  . Drug Use: No  . Sexual Activity: Yes   Other Topics Concern  . Not on file   Social History Narrative   Household-- pt and husband      Family History  Problem Relation Age of Onset  . Mitral valve prolapse Mother   . Heart disease Father     early   . Diabetes Father   . Breast cancer Mother     M had ca insitu (age 30), aunt   . Colon cancer Neg Hx   . Brain cancer Other     2 fam members   . Lung  cancer Other     uncle, heavy smoker  . Kidney failure Father   . Dementia Mother       Medication List       This list is accurate as of: 03/26/15 12:05 PM.  Always use your most recent med list.               albuterol 108 (90 Base) MCG/ACT inhaler  Commonly known as:  PROVENTIL HFA;VENTOLIN HFA  Inhale 2 puffs into the lungs every 6 (six) hours as needed.     ARNUITY ELLIPTA 200 MCG/ACT Aepb  Generic drug:  Fluticasone Furoate  Inhale 1 puff into the lungs daily.     beclomethasone 80 MCG/ACT inhaler  Commonly known as:  QVAR  Inhale 2 puffs into the lungs 2 (two) times daily as needed (FOR ASTHMA FLARES). Reported on 03/26/2015     bifidobacterium infantis capsule  Take 1 capsule by mouth daily.     diltiazem 120 MG 24 hr capsule  Commonly known as:  CARDIZEM CD  Take 1 capsule (120 mg total) by mouth daily.     fexofenadine 180 MG tablet  Commonly known as:  ALLEGRA  Take 180 mg by mouth every morning.     fluticasone 50 MCG/ACT nasal spray  Commonly known as:  FLONASE  Place 2 sprays into the nose daily.     glucose blood test strip  Check blood sugar no more than twice daily.     hydrocortisone 2.5 % cream  Apply topically 2 (two) times daily as needed.     metFORMIN 1000 MG tablet  Commonly known as:  GLUCOPHAGE  Take 1 tablet (1,000 mg total) by mouth 2 (two) times daily with a meal.     montelukast 10 MG tablet  Commonly known as:  SINGULAIR  Take 10 mg by mouth at bedtime.     multivitamin with minerals tablet  Take 1 tablet by mouth daily.     nitrofurantoin (macrocrystal-monohydrate) 100 MG capsule  Commonly known as:  MACROBID  TAKE AS DIRECTED, PRN     omeprazole 40 MG capsule  Commonly known as:  PRILOSEC  Take 1 capsule (40 mg total) by mouth 2 (two) times daily.     ONETOUCH DELICA LANCETS 33G Misc  Check blood sugar no more than twice daily     PREMARIN vaginal cream  Generic drug:  conjugated estrogens  INSERT 1 APPLICATORFUL  PER VAGINA  TWICE WEEKLY ON TUESDAYS AND FRIDAYS     rosuvastatin 5 MG tablet  Commonly known as:  CRESTOR  Take 1 tablet (5 mg total) by mouth daily.     sitaGLIPtin 100 MG tablet  Commonly known as:  JANUVIA  Take 1 tablet (100 mg total) by mouth daily.           Objective:   Physical Exam BP 122/64 mmHg  Pulse 78  Temp(Src) 98.3 F (36.8 C) (Oral)  Ht  (1.626 m)  Wt 193 lb 2 oz (87.601 kg)  BMI 33.13 kg/m2  SpO2 97% General:   Well developed, well nourished . NAD.  Neck:  Full range of motion. No TTP of the cervical spine  HEENT:  Normocephalic . Face symmetric, atraumatic Lungs:  CTA B Normal respiratory effort, no intercostal retractions, no accessory muscle use. Heart: RRR,  no murmur.  No pretibial edema bilaterally  Abdomen:  Not distended, soft, non-tender. No rebound or rigidity.  MSK: Left shoulder normal Right shoulder: No deformities, no TTP, range of motion limited by pain particularly when she reaches back. Skin: Exposed areas without rash. Not pale. Not jaundice Neurologic:  alert & oriented X3.  Speech normal, gait appropriate for age and unassisted. DTRs symmetric Strength symmetric and appropriate for age.  Psych: Cognition and judgment appear intact.  Cooperative with normal attention span and concentration.  Behavior appropriate. No anxious or depressed appearing.    Assessment & Plan:   Assessment DM  aic 6.2 (2009) Hyperlipidemia GERD, HH, Dr Ewing Schlein  Asthma -- dr Madie Reno Palpitations, PACs, sees cardiology, (-) stress test 2006 Recurrent cystitis, on poscoital abx, sx better after hysterectomy 1996, resurfaced 2011 Fibrocystic breast dz  PLAN: DM: Check A1c, continue metformin, Januvia. Hyperlipidemia: On Crestor, check FLP. Right shoulder injury, refer to orthopedic surgery. Palpitations: Few weeks ago switch from beta blockers to CCBs due to potential need of Epi-pen. Doing well. + + stress: Taking care of her parents,  counseled. RTC 6 months

## 2015-03-26 NOTE — Assessment & Plan Note (Signed)
DM: Check A1c, continue metformin, Januvia. Hyperlipidemia: On Crestor, check FLP. Right shoulder injury, refer to orthopedic surgery. Palpitations: Few weeks ago switch from beta blockers to CCBs due to potential need of Epi-pen. Doing well. + + stress: Taking care of her parents, counseled. RTC 6 months

## 2015-03-26 NOTE — Progress Notes (Signed)
Pre visit review using our clinic review tool, if applicable. No additional management support is needed unless otherwise documented below in the visit note. 

## 2015-03-30 LAB — VITAMIN D 1,25 DIHYDROXY
VITAMIN D 1, 25 (OH) TOTAL: 38 pg/mL (ref 18–72)
VITAMIN D3 1, 25 (OH): 38 pg/mL
Vitamin D2 1, 25 (OH)2: <8 pg/mL

## 2015-04-26 ENCOUNTER — Other Ambulatory Visit: Payer: Self-pay | Admitting: Orthopedic Surgery

## 2015-04-26 DIAGNOSIS — M4722 Other spondylosis with radiculopathy, cervical region: Secondary | ICD-10-CM

## 2015-05-04 ENCOUNTER — Ambulatory Visit
Admission: RE | Admit: 2015-05-04 | Discharge: 2015-05-04 | Disposition: A | Discharge status: Home / Self Care | Payer: BC Managed Care – PPO | Source: Ambulatory Visit | Attending: Orthopedic Surgery | Admitting: Orthopedic Surgery

## 2015-05-04 DIAGNOSIS — M4722 Other spondylosis with radiculopathy, cervical region: Secondary | ICD-10-CM

## 2015-06-24 ENCOUNTER — Other Ambulatory Visit: Payer: Self-pay | Admitting: Internal Medicine

## 2015-07-06 ENCOUNTER — Other Ambulatory Visit: Payer: Self-pay

## 2015-07-06 MED ORDER — SITAGLIPTIN PHOSPHATE 100 MG PO TABS: 100.0000 mg | ORAL_TABLET | Freq: Every day | ORAL | Status: DC

## 2015-07-25 ENCOUNTER — Other Ambulatory Visit: Payer: Self-pay | Admitting: Internal Medicine

## 2015-07-28 ENCOUNTER — Other Ambulatory Visit: Payer: Self-pay | Admitting: Internal Medicine

## 2015-07-28 ENCOUNTER — Other Ambulatory Visit: Payer: Self-pay | Admitting: Physician Assistant

## 2015-07-28 NOTE — Telephone Encounter (Signed)
Pt needs to schedule a follow up appointment for any future refills. [phone] 

## 2015-08-24 ENCOUNTER — Other Ambulatory Visit: Payer: Self-pay | Admitting: Physician Assistant

## 2015-08-25 NOTE — Telephone Encounter (Signed)
rosuvastatin (CRESTOR) 5 MG tablet  Medication   Date: 07/28/2015  [facility]  Ordering/Authorizing: Dyann KiefMichele M Lenze, PA-C      Order Providers    Prescribing Provider Encounter Provider   Dyann KiefMichele M Lenze, PA-C Dyann KiefMichele M Lenze, PA-C    Medication Detail      Disp Refills Start End     rosuvastatin (CRESTOR) 5 MG tablet 30 tablet 0 07/28/2015     Sig: TAKE 1 TABLET BY MOUTH EVERY DAY    Notes to Pharmacy: Pt needs to schedule a follow up appointment for any future refills [phone]    E-Prescribing Status: Receipt confirmed by pharmacy (07/28/2015 5:27 PM EDT)     Pharmacy    [facility] [id number] - [place], [place] - [address] OF [address]

## 2015-08-26 ENCOUNTER — Other Ambulatory Visit: Payer: Self-pay | Admitting: Physician Assistant

## 2015-09-07 ENCOUNTER — Other Ambulatory Visit: Payer: Self-pay | Admitting: Physician Assistant

## 2015-09-24 ENCOUNTER — Encounter: Payer: Self-pay | Admitting: Internal Medicine

## 2015-09-24 ENCOUNTER — Ambulatory Visit (INDEPENDENT_AMBULATORY_CARE_PROVIDER_SITE_OTHER): Payer: BC Managed Care – PPO | Admitting: Internal Medicine

## 2015-09-24 VITALS — BP 122/76 | HR 78 | Temp 98.1°F | Resp 14 | Ht 64.0 in | Wt 193.2 lb

## 2015-09-24 DIAGNOSIS — E118 Type 2 diabetes mellitus with unspecified complications: Secondary | ICD-10-CM

## 2015-09-24 DIAGNOSIS — Z23 Encounter for immunization: Secondary | ICD-10-CM

## 2015-09-24 DIAGNOSIS — J4530 Mild persistent asthma, uncomplicated: Secondary | ICD-10-CM

## 2015-09-24 LAB — BASIC METABOLIC PANEL
BUN: 11 mg/dL (ref 7–25)
CHLORIDE: 105 mmol/L (ref 98–110)
CO2: 23 mmol/L (ref 20–31)
CREATININE: 0.93 mg/dL (ref 0.50–1.05)
Calcium: 8.8 mg/dL (ref 8.6–10.4)
GLUCOSE: 195 mg/dL — AB (ref 65–99)
Potassium: 3.9 mmol/L (ref 3.5–5.3)
Sodium: 142 mmol/L (ref 135–146)

## 2015-09-24 NOTE — Progress Notes (Signed)
Subjective:    Patient ID: Karen Barnes, female    DOB: February 23, 1960, 55 y.o.   MRN: [id number]  DOS:  09/24/2015 Type of visit - description : Routine office visit Interval history: The last few weeks was sick with an asthma exacerbation, it took almost 4 weeks of the steroids to get better, she is finally back to normal.  last round of steroids more than 10 days ago. BPs and blood sugars were elevated while she was sick, systolic BP up to 1501 and blood sugar in the 200s sometimes.   Review of Systems No fever chills No chest pain or difficulty breathing  Past Medical History:  Diagnosis Date  . Asthma, moderate persistent    sees Dr Irena CordsVan Winkle  . Diabetes mellitus    A1C 6.2---->  2009  . Family history of adverse reaction to anesthesia    sister had cardiac arrest with rhinoplasty  . Fibrocystic breast   . GERD (gastroesophageal reflux disease)   . HH (hiatus hernia) 09/2014   Dr. Ewing SchleinMagod  . History of hiatal hernia   . Hot flashes   . Hyperlipidemia   . Normal nuclear stress test 2006  . Palpitations    PACs sees cards   . Recurrent cystitis    took  postcoital antibiotic for years, symptoms better after her hysterectomy 1996, symptoms re-surface in 2011  . RUQ abdominal pain 2016   EGD showed small HH, otherwise normal  . Wears glasses     Past Surgical History:  Procedure Laterality Date  . ABDOMINAL HYSTERECTOMY  1996   due to endometriosis and fibroids approximately 1996  . BREAST LUMPECTOMY WITH RADIOACTIVE SEED LOCALIZATION Right 03/12/2014   Procedure: BREAST LUMPECTOMY WITH RADIOACTIVE SEED LOCALIZATION;  Surgeon: Harriette Bouillonhomas Cornett, MD;  Location: [facility];  Service: General;  Laterality: Right;  . BREAST SURGERY     Lumpectomy-- BENIGN   . CATARACT EXTRACTION Bilateral 2011  . CESAREAN SECTION    . DILATION AND CURETTAGE OF UTERUS    . LIPOMA EXCISION  1978  . UPPER GASTROINTESTINAL ENDOSCOPY    . UPPER GI ENDOSCOPY  09/11/2014   Small  Hiatus Hernia, Dr. Ewing SchleinMagod    Social History   Social History  . Marital status: Married    Spouse name: N/A  . Number of children: 1  . Years of education: N/A   Occupational History  . retired principal    Social History Main Topics  . Smoking status: Never Smoker  . Smokeless tobacco: Never Used  . Alcohol use No  . Drug use: No  . Sexual activity: Yes   Other Topics Concern  . Not on file   Social History Narrative   Household-- pt and husband         Medication List       Accurate as of 09/24/15 11:59 PM. Always use your most recent med list.          albuterol 108 (90 Base) MCG/ACT inhaler Commonly known as:  PROVENTIL HFA;VENTOLIN HFA Inhale 2 puffs into the lungs every 6 (six) hours as needed.   aspirin EC 81 MG tablet Take 81 mg by mouth daily.   bifidobacterium infantis capsule Take 1 capsule by mouth daily.   diltiazem 120 MG 24 hr capsule Commonly known as:  CARDIZEM CD Take 1 capsule (120 mg total) by mouth daily.   fexofenadine 180 MG tablet Commonly known as:  ALLEGRA Take 180 mg by mouth every morning.  fluticasone 50 MCG/ACT nasal spray Commonly known as:  FLONASE Place 2 sprays into the nose daily.   glucose blood test strip Check blood sugar no more than twice daily.   hydrocortisone 2.5 % cream Apply topically 2 (two) times daily as needed.   metFORMIN 1000 MG tablet Commonly known as:  GLUCOPHAGE Take 1 tablet (1,000 mg total) by mouth 2 (two) times daily with a meal.   montelukast 10 MG tablet Commonly known as:  SINGULAIR Take 10 mg by mouth at bedtime.   multivitamin with minerals tablet Take 1 tablet by mouth daily.   nitrofurantoin (macrocrystal-monohydrate) 100 MG capsule Commonly known as:  MACROBID Take as directed, Post coital   omeprazole 40 MG capsule Commonly known as:  PRILOSEC Take 1 capsule (40 mg total) by mouth 2 (two) times daily.   ONETOUCH DELICA LANCETS 33G Misc Check blood sugar no more than  twice daily   PREMARIN vaginal cream Generic drug:  conjugated estrogens INSERT 1 APPLICATORFUL PER VAGINA  TWICE WEEKLY ON TUESDAYS AND FRIDAYS   rosuvastatin 5 MG tablet Commonly known as:  CRESTOR TAKE 1 TABLET BY MOUTH DAILY   sitaGLIPtin 100 MG tablet Commonly known as:  JANUVIA Take 1 tablet (100 mg total) by mouth daily.   SYMBICORT 160-4.5 MCG/ACT inhaler Generic drug:  budesonide-formoterol Inhale 2 puffs into the lungs 2 (two) times daily.          Objective:   Physical Exam BP 122/76 (BP Location: Left Arm, Patient Position: Sitting, Cuff Size: Normal)   Pulse 78   Temp 98.1 F (36.7 C) (Oral)   Resp 14   Ht 5\' 4"  (1.626 m)   Wt 193 lb 4 oz (87.7 kg)   SpO2 97%   BMI 33.17 kg/m  General:   Well developed, well nourished . NAD.  HEENT:  Normocephalic . Face symmetric, atraumatic Lungs:  CTA B Normal respiratory effort, no intercostal retractions, no accessory muscle use. Heart: RRR,  no murmur.  No pretibial edema bilaterally  DIABETIC FEET EXAM: No lower extremity edema Normal pedal pulses bilaterally Skin normal, nails normal, no calluses Pinprick examination of the feet normal. Skin: Not pale. Not jaundice Neurologic:  alert & oriented X3.  Speech normal, gait appropriate for age and unassisted Psych--  Cognition and judgment appear intact.  Cooperative with normal attention span and concentration.  Behavior appropriate. No anxious or depressed appearing.      Assessment & Plan:   Assessment DM  aic 6.2 (2009) Hyperlipidemia GERD, HH, Dr Ewing SchleinMagod  Asthma -- dr Madie RenoVanWinkle Palpitations, PACs, sees cardiology, (-) stress test 2006 Recurrent cystitis, on poscoital abx, sx better after hysterectomy 1996, resurfaced 2011 Fibrocystic breast dz  PLAN: DM: Continue metformin, Januvia, check a A1c and BMP. Feet exam negative. Hyperlipidemia: Controlled per last FLP. Continue Crestor Asthma: Status post recent exacerbation, now back to  normal. Primary care: Prevnar today. RTC 03-2016 CPX

## 2015-09-24 NOTE — Progress Notes (Signed)
Pre visit review using our clinic review tool, if applicable. No additional management support is needed unless otherwise documented below in the visit note. 

## 2015-09-24 NOTE — Patient Instructions (Signed)
GO TO THE LAB : Get the blood work     GO TO THE FRONT DESK Schedule your next appointment for a  physical exam by 03-2016

## 2015-09-25 LAB — HEMOGLOBIN A1C
HEMOGLOBIN A1C: 8 % — AB (ref <5.7)
Mean Plasma Glucose: 183 mg/dL

## 2015-09-25 NOTE — Assessment & Plan Note (Signed)
DM: Continue metformin, Januvia, check a A1c and BMP. Feet exam negative. Hyperlipidemia: Controlled per last FLP. Continue Crestor Asthma: Status post recent exacerbation, now back to normal. Primary care: Prevnar today. RTC 03-2016 CPX

## 2015-09-27 ENCOUNTER — Other Ambulatory Visit: Payer: Self-pay | Admitting: Physician Assistant

## 2015-09-27 MED ORDER — METFORMIN HCL 1000 MG PO TABS: ORAL_TABLET | ORAL | 1 refills | Status: DC

## 2015-09-27 NOTE — Addendum Note (Signed)
Addended byConrad Defiance: Laken Rog D on: 09/27/2015 03:48 PM   Modules accepted: Orders

## 2015-10-08 ENCOUNTER — Ambulatory Visit: Payer: BC Managed Care – PPO | Admitting: Internal Medicine

## 2015-10-08 LAB — HM DIABETES EYE EXAM

## 2015-10-12 ENCOUNTER — Encounter: Payer: Self-pay | Admitting: Physician Assistant

## 2015-10-12 ENCOUNTER — Ambulatory Visit (INDEPENDENT_AMBULATORY_CARE_PROVIDER_SITE_OTHER): Payer: BC Managed Care – PPO | Admitting: Physician Assistant

## 2015-10-12 VITALS — BP 144/86 | HR 88 | Ht 64.0 in | Wt 187.1 lb

## 2015-10-12 DIAGNOSIS — E785 Hyperlipidemia, unspecified: Secondary | ICD-10-CM

## 2015-10-12 DIAGNOSIS — R002 Palpitations: Secondary | ICD-10-CM | POA: Diagnosis not present

## 2015-10-12 DIAGNOSIS — J4541 Moderate persistent asthma with (acute) exacerbation: Secondary | ICD-10-CM

## 2015-10-12 DIAGNOSIS — E669 Obesity, unspecified: Secondary | ICD-10-CM | POA: Diagnosis not present

## 2015-10-12 DIAGNOSIS — J40 Bronchitis, not specified as acute or chronic: Secondary | ICD-10-CM

## 2015-10-12 DIAGNOSIS — I10 Essential (primary) hypertension: Secondary | ICD-10-CM | POA: Diagnosis not present

## 2015-10-12 MED ORDER — DILTIAZEM HCL ER COATED BEADS 120 MG PO CP24: 120.0000 mg | ORAL_CAPSULE | Freq: Every day | ORAL | 3 refills | Status: DC

## 2015-10-12 MED ORDER — ROSUVASTATIN CALCIUM 5 MG PO TABS: 5.0000 mg | ORAL_TABLET | Freq: Every day | ORAL | 3 refills | Status: DC

## 2015-10-12 NOTE — Progress Notes (Signed)
She can see Dr. Duke Salviaandolph.

## 2015-10-12 NOTE — Progress Notes (Signed)
Cardiology Office Note    Date:  10/12/2015   ID:  Karen CottonBeverly B Ostermann, DOB Aug 21, 1960, MRN [id number]  PCP:  Willow OraJose Paz, MD  Cardiologist: Dr. Shirlee LatchMcLean  Chief Complaint  Patient presents with  . Follow-up    History of Present Illness:  Karen Barnes is a 55 y.o. female   a history of DM 2, hypertension, and symptomatic palpitations controlled with atenolol.. She has a strong family history of premature CAD. She had a normal stress test in 2006. Her father had an MI at 6747 and later CABG. Her mother had atrial fibrillation. 2 grandparents with presumed SCD. Uncles with MIs in their 3850s. Lipid profile in 04/07/14 cholesterol 150 triglycerides 86 HDL 59 and LDL 74.  I saw her last year for follow-up. She had 2 episodes of palpitations associated with excessive caffeine and increased stress. Diet and exercise were recommended for weight loss.  Here for yearly follow up.Having a terrible time with asthma and bronchitis. Has been on steroids and antibiotics since July. Allergist requested her to change from beta blocker to cardizem and she has done well with this. Occasional palpations with the multiple pulmonary meds. BP has been up a little on the steroids. Usually it's 122/78 when not on steroids.She is given up all diet drinks and feels somewhat better. She is requesting a referral to pulmonary because she is so frustrated with not getting better.         Past Medical History:  Diagnosis Date  . Asthma, moderate persistent    sees Dr Irena CordsVan Winkle  . Diabetes mellitus    A1C 6.2---->  2009  . Family history of adverse reaction to anesthesia    sister had cardiac arrest with rhinoplasty  . Fibrocystic breast   . GERD (gastroesophageal reflux disease)   . HH (hiatus hernia) 09/2014   Dr. Ewing SchleinMagod  . History of hiatal hernia   . Hot flashes   . Hyperlipidemia   . Normal nuclear stress test 2006  . Palpitations    PACs sees cards   . Recurrent cystitis    took  postcoital antibiotic for  years, symptoms better after her hysterectomy 1996, symptoms re-surface in 2011  . RUQ abdominal pain 2016   EGD showed small HH, otherwise normal  . Wears glasses     Past Surgical History:  Procedure Laterality Date  . ABDOMINAL HYSTERECTOMY  1996   due to endometriosis and fibroids approximately 1996  . BREAST LUMPECTOMY WITH RADIOACTIVE SEED LOCALIZATION Right 03/12/2014   Procedure: BREAST LUMPECTOMY WITH RADIOACTIVE SEED LOCALIZATION;  Surgeon: Harriette Bouillonhomas Cornett, MD;  Location: [facility];  Service: General;  Laterality: Right;  . BREAST SURGERY     Lumpectomy-- BENIGN   . CATARACT EXTRACTION Bilateral 2011  . CESAREAN SECTION    . DILATION AND CURETTAGE OF UTERUS    . LIPOMA EXCISION  1978  . UPPER GASTROINTESTINAL ENDOSCOPY    . UPPER GI ENDOSCOPY  09/11/2014   Small Hiatus Hernia, Dr. Ewing SchleinMagod    Current Medications: Outpatient Medications Prior to Visit  Medication Sig Dispense Refill  . albuterol (PROVENTIL HFA;VENTOLIN HFA) 108 (90 BASE) MCG/ACT inhaler Inhale 2 puffs into the lungs every 6 (six) hours as needed.    Marland Kitchen. aspirin EC 81 MG tablet Take 81 mg by mouth daily.    . bifidobacterium infantis (ALIGN) capsule Take 1 capsule by mouth daily.    . budesonide-formoterol (SYMBICORT) 160-4.5 MCG/ACT inhaler Inhale 2 puffs into the lungs 2 (two)  times daily.    . fexofenadine (ALLEGRA) 180 MG tablet Take 180 mg by mouth every morning.     . fluticasone (FLONASE) 50 MCG/ACT nasal spray Place 2 sprays into the nose daily.      . hydrocortisone 2.5 % cream Apply topically 2 (two) times daily as needed. 30 g 0  . metFORMIN (GLUCOPHAGE) 1000 MG tablet Take 1.5 tablets by mouth every morning and 1 tablet by mouth every evening. 225 tablet 1  . montelukast (SINGULAIR) 10 MG tablet Take 10 mg by mouth at bedtime.      . Multiple Vitamins-Minerals (MULTIVITAMIN WITH MINERALS) tablet Take 1 tablet by mouth daily.    . nitrofurantoin, macrocrystal-monohydrate, (MACROBID) 100 MG  capsule Take as directed, Post coital 30 capsule 0  . omeprazole (PRILOSEC) 40 MG capsule Take 1 capsule (40 mg total) by mouth 2 (two) times daily. 180 capsule 3  . PREMARIN vaginal cream INSERT 1 APPLICATORFUL PER VAGINA  TWICE WEEKLY ON TUESDAYS AND FRIDAYS  0  . sitaGLIPtin (JANUVIA) 100 MG tablet Take 1 tablet (100 mg total) by mouth daily. 30 tablet 3  . diltiazem (CARDIZEM CD) 120 MG 24 hr capsule Take 1 capsule (120 mg total) by mouth daily. 90 capsule 3  . rosuvastatin (CRESTOR) 5 MG tablet TAKE 1 TABLET BY MOUTH DAILY 30 tablet 0  . glucose blood test strip Check blood sugar no more than twice daily. (Patient not taking: Reported on 09/18/2014) 100 each 12  . ONETOUCH DELICA LANCETS 33G MISC Check blood sugar no more than twice daily (Patient not taking: Reported on 03/26/2015) 100 each 12   No facility-administered medications prior to visit.      Allergies:   Butorphanol tartrate; Hydrocodone; Roxicet [oxycodone-acetaminophen]; and Penicillins   Social History   Social History  . Marital status: Married    Spouse name: N/A  . Number of children: 1  . Years of education: N/A   Occupational History  . retired principal    Social History Main Topics  . Smoking status: Never Smoker  . Smokeless tobacco: Never Used  . Alcohol use No  . Drug use: No  . Sexual activity: Yes   Other Topics Concern  . None   Social History Narrative   Household-- pt and husband      Family History:  The patient's   family history includes Brain cancer in her other; Breast cancer in her mother; Dementia in her mother; Diabetes in her father; Heart disease in her father; Kidney failure in her father; Lung cancer in her other; Mitral valve prolapse in her mother.   ROS:   Please see the history of present illness.    Review of Systems  Constitution: Negative.  HENT: Negative.   Eyes: Negative.   Cardiovascular: Positive for dyspnea on exertion and leg swelling.  Respiratory: Positive for  cough, snoring and wheezing.   Hematologic/Lymphatic: Bruises/bleeds easily.  Musculoskeletal: Negative.  Negative for joint pain.  Gastrointestinal: Negative.   Genitourinary: Negative.   Neurological: Negative.    All other systems reviewed and are negative.   PHYSICAL EXAM:   VS:  BP (!) 144/86   Pulse 88   Ht 5\' 4"  (1.626 m)   Wt 187 lb 1.9 oz (84.9 kg)   BMI 32.12 kg/m   Physical Exam  GEN: Well nourished, well developed, in no acute distress  Neck: no JVD, carotid bruits, or masses Cardiac:RRR; no murmurs, rubs, or gallops  Respiratory:  clear to auscultation bilaterally, normal  work of breathing GI: soft, nontender, nondistended, + BS Ext: without cyanosis, clubbing, or edema, Good distal pulses bilaterally MS: no deformity or atrophy  Skin: warm and dry, no rash Psych: euthymic mood, full affect  Wt Readings from Last 3 Encounters:  10/12/15 187 lb 1.9 oz (84.9 kg)  09/24/15 193 lb 4 oz (87.7 kg)  03/26/15 193 lb 2 oz (87.6 kg)      Studies/Labs Reviewed:   EKG:  EKG is  ordered today.    Recent Labs: 03/26/2015: ALT 17; TSH 0.60 09/24/2015: BUN 11; Creat 0.93; Potassium 3.9; Sodium 142   Lipid Panel    Component Value Date/Time   CHOL 143 03/26/2015 0949   TRIG 72.0 03/26/2015 0949   HDL 57.90 03/26/2015 0949   CHOLHDL 2 03/26/2015 0949   VLDL 14.4 03/26/2015 0949   LDLCALC 71 03/26/2015 0949    Additional studies/ records that were reviewed today include:   Normal GXT 2006    ASSESSMENT:    1. Bronchitis   2. Asthma, moderate persistent, with acute exacerbation   3. Heart palpitations   4. Obesity   5. Essential hypertension   6. Hyperlipidemia      PLAN:  In order of problems listed above: Bronchitis and moderate persistent asthma on steroids and antibiotics off and on since July. Patient is requesting referral to pulmonary which we have done.  Heart palpitations controlled on diltiazem. Used to be on a  beta blocker but this was  switched by her allergist and she has tolerated it well. Follow-up with cardiologist in 1 year. We'll discuss with Dr. Shirlee Latch who she will be assigned to.  Obesity has lost 20 pounds over the past year and is continuing to try to lose weight. Exercises limited because of asthma. Essential hypertension well controlled when she is not on steroids. It has been up a little with her asthmatic problems. Continue 2 g sodium diet  Hyperlipidemia lipid profile was checked in February by primary care in stable. Continue Crestor       Medication Adjustments/Labs and Tests Ordered: Current medicines are reviewed at length with the patient today.  Concerns regarding medicines are outlined above.  Medication changes, Labs and Tests ordered today are listed in the Patient Instructions below. Patient Instructions  Schedule appointment with Pulmonary    Your physician wants you to follow-up in: 1 year with cardiologist. You will receive a reminder letter in the mail two months in advance. If you don't receive a letter, please call our office to schedule the follow-up appointment.     Elson Clan, PA-C  10/12/2015 11:15 AM    [facility] [address] [place], [place], [place]  [address] Phone: [phone]; Fax: [phone]

## 2015-10-12 NOTE — Patient Instructions (Signed)
Schedule appointment with Pulmonary    Your physician wants you to follow-up in: 1 year with cardiologist. You will receive a reminder letter in the mail two months in advance. If you don't receive a letter, please call our office to schedule the follow-up appointment.

## 2015-10-13 ENCOUNTER — Other Ambulatory Visit: Payer: Self-pay | Admitting: Allergy and Immunology

## 2015-10-13 ENCOUNTER — Ambulatory Visit
Admission: RE | Admit: 2015-10-13 | Discharge: 2015-10-13 | Disposition: A | Discharge status: Home / Self Care | Payer: BC Managed Care – PPO | Source: Ambulatory Visit | Attending: Allergy and Immunology | Admitting: Allergy and Immunology

## 2015-10-13 DIAGNOSIS — R059 Cough, unspecified: Secondary | ICD-10-CM

## 2015-10-13 DIAGNOSIS — R05 Cough: Secondary | ICD-10-CM

## 2015-10-14 ENCOUNTER — Telehealth: Payer: Self-pay

## 2015-10-14 NOTE — Telephone Encounter (Signed)
-----   Message from Dyann KiefMichele M Lenze, [place] sent at 10/13/2015  8:04 AM EDT ----- Dr. Shirlee LatchMcLean says to have pt f/u with Dr. Duke Salviaandolph. Can you arrange?  Thanks, Elon JesterMichele ----- Message ----- From: Laurey Moralealton S McLean, MD Sent: 10/12/2015  10:08 PM To: Dyann KiefMichele M Lenze, PA-C    ----- Message ----- From: Dyann KiefMichele M Lenze, PA-C Sent: 10/12/2015  11:15 AM To: Laurey Moralealton S McLean, MD  Dr. Shirlee LatchMcLean, can you assign this patient a cardiologist to see in 1 yr? Thanks, Elon JesterMichele

## 2015-10-14 NOTE — Telephone Encounter (Signed)
Left message on patient's personal voice mail Dr.McLean advised to follow up with Dr.Linwood in 12 months.

## 2015-10-18 ENCOUNTER — Other Ambulatory Visit: Payer: Self-pay | Admitting: Allergy and Immunology

## 2015-10-18 ENCOUNTER — Ambulatory Visit
Admission: RE | Admit: 2015-10-18 | Discharge: 2015-10-18 | Disposition: A | Discharge status: Home / Self Care | Payer: BC Managed Care – PPO | Source: Ambulatory Visit | Attending: Allergy and Immunology | Admitting: Allergy and Immunology

## 2015-10-18 DIAGNOSIS — R05 Cough: Secondary | ICD-10-CM

## 2015-10-18 DIAGNOSIS — R059 Cough, unspecified: Secondary | ICD-10-CM

## 2015-10-18 DIAGNOSIS — J4540 Moderate persistent asthma, uncomplicated: Secondary | ICD-10-CM

## 2015-10-22 ENCOUNTER — Other Ambulatory Visit: Payer: Self-pay | Admitting: Internal Medicine

## 2015-11-03 ENCOUNTER — Ambulatory Visit (INDEPENDENT_AMBULATORY_CARE_PROVIDER_SITE_OTHER): Payer: BC Managed Care – PPO | Admitting: Pulmonary Disease

## 2015-11-03 ENCOUNTER — Other Ambulatory Visit (INDEPENDENT_AMBULATORY_CARE_PROVIDER_SITE_OTHER): Payer: BC Managed Care – PPO

## 2015-11-03 ENCOUNTER — Encounter: Payer: Self-pay | Admitting: Pulmonary Disease

## 2015-11-03 DIAGNOSIS — J4521 Mild intermittent asthma with (acute) exacerbation: Secondary | ICD-10-CM | POA: Diagnosis not present

## 2015-11-03 DIAGNOSIS — R05 Cough: Secondary | ICD-10-CM

## 2015-11-03 DIAGNOSIS — K219 Gastro-esophageal reflux disease without esophagitis: Secondary | ICD-10-CM

## 2015-11-03 DIAGNOSIS — R911 Solitary pulmonary nodule: Secondary | ICD-10-CM

## 2015-11-03 DIAGNOSIS — R059 Cough, unspecified: Secondary | ICD-10-CM

## 2015-11-03 LAB — CBC WITH DIFFERENTIAL/PLATELET
BASOS ABS: 0 10*3/uL (ref 0.0–0.1)
BASOS PCT: 0.4 % (ref 0.0–3.0)
EOS ABS: 0.1 10*3/uL (ref 0.0–0.7)
Eosinophils Relative: 1.1 % (ref 0.0–5.0)
HEMATOCRIT: 40.4 % (ref 36.0–46.0)
HEMOGLOBIN: 13.5 g/dL (ref 12.0–15.0)
LYMPHS PCT: 17.3 % (ref 12.0–46.0)
Lymphs Abs: 2 10*3/uL (ref 0.7–4.0)
MCHC: 33.5 g/dL (ref 30.0–36.0)
MCV: 83.1 fl (ref 78.0–100.0)
MONOS PCT: 5.9 % (ref 3.0–12.0)
Monocytes Absolute: 0.7 10*3/uL (ref 0.1–1.0)
Neutro Abs: 8.7 10*3/uL — ABNORMAL HIGH (ref 1.4–7.7)
Neutrophils Relative %: 75.3 % (ref 43.0–77.0)
Platelets: 278 10*3/uL (ref 150.0–400.0)
RBC: 4.86 Mil/uL (ref 3.87–5.11)
RDW: 15.9 % — ABNORMAL HIGH (ref 11.5–15.5)
WBC: 11.5 10*3/uL — AB (ref 4.0–10.5)

## 2015-11-03 NOTE — Assessment & Plan Note (Signed)
Persistent cough is likely exacerbated by postnasal drip and acid reflux. Has above, I think she needs treatment for both of these. However, I also think there is a component of ongoing laryngeal irritation. As to her in detail today.  Plan: Voice rest was strongly encouraged Use Delsym around the clock for 3 days during which time she doesn't talk, speak, while her clear her throat. She should try to suppress her cough during this time.

## 2015-11-03 NOTE — Patient Instructions (Addendum)
Take a Pepcid at night Follow the gastroesophageal reflux lifestyle modification sheet we gave you  We will arrange for a CT scan in one year.  You need to try to suppress your cough to allow your larynx (voice box) to heal.  For three days don't talk, laugh, sing, or clear your throat. Do everything you can to suppress the cough during this time. Use hard candies (sugarless Jolly Ranchers) or non-mint or non-menthol containing cough drops during this time to soothe your throat.  Use a cough suppressant (Delsym or what I have prescribed you) around the clock during this time.  After three days, gradually increase the use of your voice and back off on the cough suppressants.  I recommend that she go back on immunotherapy  We will call you with the results of today's blood work  Follow-up with your allergist

## 2015-11-03 NOTE — Assessment & Plan Note (Signed)
She has had a long, drawn out summer of symptoms which could be consistent with prolonged asthma exacerbation. Fortunately today on exam she's not wheezing with the exception of a mild upper airway wheeze, see discussion below.  I worry that her persistent symptoms represent worsening of her asthma initially started off by a bad viral infection. I agree completely with the care provided by her allergist Dr. Debbora PrestoVon Winkel.  Some of her symptoms may be caused by laryngeal irritation from postnasal drip and acid reflux, see discussion below.  On my review of her CT scan I see no evidence of underlying structural lung disease to explain her symptoms.  At this point I think the best approach is to get her back on immunotherapy to try to help control her symptoms. If however this is ineffective she may benefit from either Xolair or one of the newer anti-IL 5 antibodies.  Plan: Go back to allergy, would start immunotherapy Treat acid reflux with Pepcid at night, omeprazole during the daytime Acid reflux lifestyle modifications reviewed Check serum IgE and CBC with differential

## 2015-11-03 NOTE — Assessment & Plan Note (Signed)
Not clearly the sole cause of her symptoms but could be contributing. Have advised that she take Pepcid at night.

## 2015-11-03 NOTE — Assessment & Plan Note (Signed)
She has a very small pulmonary nodule in the left lower lobe. I personally reviewed the images today that showed a 3.4 mm nodule. Because it so small, round, smooth it's very benign in appearance. However, considering her long secondhand smoke exposure is reasonable to repeat another CT chest in one year. We will arrange for this and then arrange for follow-up.

## 2015-11-03 NOTE — Progress Notes (Signed)
Subjective:    Patient ID: Karen Barnes, female    DOB: 03/01/1960, 55 y.o.   MRN: [id number]  HPI Chief Complaint  Patient presents with  . Advice Only    Referred by Dr. Shirlee LatchMcLean for asthma.  dx'ed with asthma at age 55.    Karen Barnes is here to see me today for asthma.    She tells me that in August 14, 2015 she became ill.  Since then she has been quite ill.  She says that she has been treated with 6 rounds of antibiotics and at least as many rounds of prednisone.  She says that she has been feeling tired, weak, and nauseated for most of the time.  She has been coughing up yellow mucus during this time.  Earlier in the summer she was "drowning in mucus".  Prior to this summer she says that her asthma was well controlled and she may have some bronchitis 1-2 times per year which was treated with antibiotics and prednisone successfully.  However this year has been much harder.  She says that around June her daughter and granddaughter were sick with an upper respiratory infection.  Not long after this exposure she spent the night in a hot condo and then things started going downhill.  During this illness she had coughing, choking, wheezing, and lots of sinus congestion and mucus production from her lungs.    She has been thinking that this was caused by bronchitis and asthma.  She said she felt so bad that she may have pneumonia.  She has been using albuterol more frequently during this time.  At the worst she was using the albuterol inhaler 6 times per day plus as many nebulized treatments.  She was treated with a duoneb once that helped in an urgent care. She said that Levaquin helped some when she took it.  Albuterol has not been as helpful as duoneb.    No changes changes in work environment or living environment.  She was a Engineer, [employer].  Home doesn'st have a basement, crawl space is dry.  At this point she has a persistent cough and sometimes she will "get tight",  sometimes she has a dry hack, sometimes she will still have post nasal drip and mucus production.  She takes flonase, singulair, omeprazole, symbicort and spiriva.  She takes allegra bid.    She was treated with immunotherapy with Dr. Tarri AbernethyBartless years ago. In January she was treated with immunotherapy again.  This was held due to her illness.   She is a lifelong non-smoker but her parents smoked a lot when she was a kid growing up.       Past Medical History:  Diagnosis Date  . Asthma, moderate persistent    sees Dr Irena CordsVan Winkle  . Diabetes mellitus    A1C 6.2---->  2009  . Family history of adverse reaction to anesthesia    sister had cardiac arrest with rhinoplasty  . Fibrocystic breast   . GERD (gastroesophageal reflux disease)   . HH (hiatus hernia) 09/2014   Dr. Ewing SchleinMagod  . History of hiatal hernia   . Hot flashes   . Hyperlipidemia   . Normal nuclear stress test 2006  . Palpitations    PACs sees cards   . Recurrent cystitis    took  postcoital antibiotic for years, symptoms better after her hysterectomy 1996, symptoms re-surface in 2011  . RUQ abdominal pain 2016   EGD showed small HH, otherwise  normal  . Wears glasses      Family History  Problem Relation Age of Onset  . Mitral valve prolapse Mother   . Breast cancer Mother     M had ca insitu (age 30), aunt   . Dementia Mother   . Heart disease Father     early   . Diabetes Father   . Kidney failure Father   . Brain cancer Other     2 fam members   . Lung cancer Other     uncle, heavy smoker  . Colon cancer Neg Hx      Social History   Social History  . Marital status: Married    Spouse name: N/A  . Number of children: 1  . Years of education: N/A   Occupational History  . retired principal    Social History Main Topics  . Smoking status: Never Smoker  . Smokeless tobacco: Never Used  . Alcohol use No  . Drug use: No  . Sexual activity: Yes   Other Topics Concern  . Not on file   Social  History Narrative   Household-- pt and husband      Allergies  Allergen Reactions  . Butorphanol Tartrate Other (See Comments)    REACTION: hallucinations  . Hydrocodone Other (See Comments)    HALLUCINATIONS  . Roxicet [Oxycodone-Acetaminophen] Other (See Comments)    HALLUCINATIONS  . Penicillins Hives     Outpatient Medications Prior to Visit  Medication Sig Dispense Refill  . albuterol (PROVENTIL HFA;VENTOLIN HFA) 108 (90 BASE) MCG/ACT inhaler Inhale 2 puffs into the lungs every 6 (six) hours as needed.    Marland Kitchen aspirin EC 81 MG tablet Take 81 mg by mouth daily.    . bifidobacterium infantis (ALIGN) capsule Take 1 capsule by mouth daily.    . budesonide-formoterol (SYMBICORT) 160-4.5 MCG/ACT inhaler Inhale 2 puffs into the lungs 2 (two) times daily.    Marland Kitchen diltiazem (CARDIZEM CD) 120 MG 24 hr capsule Take 1 capsule (120 mg total) by mouth daily. 90 capsule 3  . fexofenadine (ALLEGRA) 180 MG tablet Take 180 mg by mouth every morning.     . fluticasone (FLONASE) 50 MCG/ACT nasal spray Place 2 sprays into the nose daily.      . hydrocortisone 2.5 % cream Apply topically 2 (two) times daily as needed. 30 g 0  . metFORMIN (GLUCOPHAGE) 1000 MG tablet Take 1.5 tablets by mouth every morning and 1 tablet by mouth every evening. 225 tablet 1  . montelukast (SINGULAIR) 10 MG tablet Take 10 mg by mouth at bedtime.      . Multiple Vitamins-Minerals (MULTIVITAMIN WITH MINERALS) tablet Take 1 tablet by mouth daily.    . nitrofurantoin, macrocrystal-monohydrate, (MACROBID) 100 MG capsule Take as directed, Post coital 30 capsule 0  . omeprazole (PRILOSEC) 40 MG capsule Take 1 capsule (40 mg total) by mouth 2 (two) times daily. (Patient taking differently: Take 40 mg by mouth daily. ) 180 capsule 3  . PREMARIN vaginal cream INSERT 1 APPLICATORFUL PER VAGINA  TWICE WEEKLY ON TUESDAYS AND FRIDAYS  0  . rosuvastatin (CRESTOR) 5 MG tablet Take 1 tablet (5 mg total) by mouth daily. 90 tablet 3  . sitaGLIPtin  (JANUVIA) 100 MG tablet Take 1 tablet (100 mg total) by mouth daily. 30 tablet 8  . benzonatate (TESSALON) 200 MG capsule Take 1 capsule by mouth 3 (three) times daily.    Marland Kitchen doxycycline (DORYX) 100 MG EC tablet Take 1 tablet by  mouth 2 (two) times daily.     No facility-administered medications prior to visit.       Review of Systems  Constitutional: Positive for unexpected weight change. Negative for fever.  HENT: Positive for congestion, postnasal drip and sinus pressure. Negative for dental problem, ear pain, nosebleeds, rhinorrhea, sneezing, sore throat and trouble swallowing.   Eyes: Negative for redness and itching.  Respiratory: Positive for cough. Negative for chest tightness, shortness of breath and wheezing.   Cardiovascular: Negative for palpitations and leg swelling.  Gastrointestinal: Positive for nausea. Negative for vomiting.  Genitourinary: Negative for dysuria.  Musculoskeletal: Negative for joint swelling.  Skin: Negative for rash.  Neurological: Negative for headaches.  Hematological: Does not bruise/bleed easily.  Psychiatric/Behavioral: Negative for dysphoric mood. The patient is not nervous/anxious.        Objective:   Physical Exam Vitals:   11/03/15 1413  BP: 130/76  Pulse: (!) 104  SpO2: 93%  Weight: 184 lb 12.8 oz (83.8 kg)  Height: 5\' 4"  (1.626 m)   RA  Gen: well appearing, no acute distress HENT: NCAT, OP clear, neck supple without masses Eyes: PERRL, EOMi Lymph: no cervical lymphadenopathy PULM: Mostly clear with rare and mild upper airway wheeze, normal air movement CV: RRR, no mgr, no JVD GI: BS+, soft, nontender, no hsm Derm: no rash or skin breakdown MSK: normal bulk and tone Neuro: A&Ox4, CN II-XII intact, strength 5/5 in all 4 extremities Psyche: normal mood and affect   10/18/2015 chest CT images personal reviewed showing normal pulmonary parenchyma with the exception of a 3.4 mm nodule in the left lower lobe.  August 2017  primary care office notes reviewed where she was seen for an asthma exacerbation. She took prednisone.     Assessment & Plan:  Asthma She has had a long, drawn out summer of symptoms which could be consistent with prolonged asthma exacerbation. Fortunately today on exam she's not wheezing with the exception of a mild upper airway wheeze, see discussion below.  I worry that her persistent symptoms represent worsening of her asthma initially started off by a bad viral infection. I agree completely with the care provided by her allergist Dr. Debbora Presto.  Some of her symptoms may be caused by laryngeal irritation from postnasal drip and acid reflux, see discussion below.  On my review of her CT scan I see no evidence of underlying structural lung disease to explain her symptoms.  At this point I think the best approach is to get her back on immunotherapy to try to help control her symptoms. If however this is ineffective she may benefit from either Xolair or one of the newer anti-IL 5 antibodies.  Plan: Go back to allergy, would start immunotherapy Treat acid reflux with Pepcid at night, omeprazole during the daytime Acid reflux lifestyle modifications reviewed Check serum IgE and CBC with differential  GERD Not clearly the sole cause of her symptoms but could be contributing. Have advised that she take Pepcid at night.  Solitary pulmonary nodule She has a very small pulmonary nodule in the left lower lobe. I personally reviewed the images today that showed a 3.4 mm nodule. Because it so small, round, smooth it's very benign in appearance. However, considering her long secondhand smoke exposure is reasonable to repeat another CT chest in one year. We will arrange for this and then arrange for follow-up.  Cough Persistent cough is likely exacerbated by postnasal drip and acid reflux. Has above, I think she needs treatment  for both of these. However, I also think there is a component of ongoing  laryngeal irritation. As to her in detail today.  Plan: Voice rest was strongly encouraged Use Delsym around the clock for 3 days during which time she doesn't talk, speak, while her clear her throat. She should try to suppress her cough during this time.    Current Outpatient Prescriptions:  .  albuterol (PROVENTIL HFA;VENTOLIN HFA) 108 (90 BASE) MCG/ACT inhaler, Inhale 2 puffs into the lungs every 6 (six) hours as needed., Disp: , Rfl:  .  aspirin EC 81 MG tablet, Take 81 mg by mouth daily., Disp: , Rfl:  .  bifidobacterium infantis (ALIGN) capsule, Take 1 capsule by mouth daily., Disp: , Rfl:  .  budesonide-formoterol (SYMBICORT) 160-4.5 MCG/ACT inhaler, Inhale 2 puffs into the lungs 2 (two) times daily., Disp: , Rfl:  .  diltiazem (CARDIZEM CD) 120 MG 24 hr capsule, Take 1 capsule (120 mg total) by mouth daily., Disp: 90 capsule, Rfl: 3 .  fexofenadine (ALLEGRA) 180 MG tablet, Take 180 mg by mouth every morning. , Disp: , Rfl:  .  fluticasone (FLONASE) 50 MCG/ACT nasal spray, Place 2 sprays into the nose daily.  , Disp: , Rfl:  .  hydrocortisone 2.5 % cream, Apply topically 2 (two) times daily as needed., Disp: 30 g, Rfl: 0 .  metFORMIN (GLUCOPHAGE) 1000 MG tablet, Take 1.5 tablets by mouth every morning and 1 tablet by mouth every evening., Disp: 225 tablet, Rfl: 1 .  montelukast (SINGULAIR) 10 MG tablet, Take 10 mg by mouth at bedtime.  , Disp: , Rfl:  .  Multiple Vitamins-Minerals (MULTIVITAMIN WITH MINERALS) tablet, Take 1 tablet by mouth daily., Disp: , Rfl:  .  nitrofurantoin, macrocrystal-monohydrate, (MACROBID) 100 MG capsule, Take as directed, Post coital, Disp: 30 capsule, Rfl: 0 .  omeprazole (PRILOSEC) 40 MG capsule, Take 1 capsule (40 mg total) by mouth 2 (two) times daily. (Patient taking differently: Take 40 mg by mouth daily. ), Disp: 180 capsule, Rfl: 3 .  PREMARIN vaginal cream, INSERT 1 APPLICATORFUL PER VAGINA  TWICE WEEKLY ON TUESDAYS AND FRIDAYS, Disp: , Rfl: 0 .   rosuvastatin (CRESTOR) 5 MG tablet, Take 1 tablet (5 mg total) by mouth daily., Disp: 90 tablet, Rfl: 3 .  sitaGLIPtin (JANUVIA) 100 MG tablet, Take 1 tablet (100 mg total) by mouth daily., Disp: 30 tablet, Rfl: 8 .  Tiotropium Bromide Monohydrate (SPIRIVA RESPIMAT) 2.5 MCG/ACT AERS, Inhale 2 puffs into the lungs daily., Disp: , Rfl:

## 2015-11-04 LAB — IGE: IgE (Immunoglobulin E), Serum: 32 kU/L (ref <115)

## 2015-11-29 ENCOUNTER — Ambulatory Visit (INDEPENDENT_AMBULATORY_CARE_PROVIDER_SITE_OTHER): Payer: BC Managed Care – PPO | Admitting: Physician Assistant

## 2015-11-29 ENCOUNTER — Encounter: Payer: Self-pay | Admitting: Physician Assistant

## 2015-11-29 VITALS — BP 129/84 | HR 92 | Temp 98.0°F | Resp 16 | Ht 64.0 in | Wt 185.0 lb

## 2015-11-29 DIAGNOSIS — J019 Acute sinusitis, unspecified: Secondary | ICD-10-CM | POA: Diagnosis not present

## 2015-11-29 DIAGNOSIS — B9689 Other specified bacterial agents as the cause of diseases classified elsewhere: Secondary | ICD-10-CM

## 2015-11-29 DIAGNOSIS — R42 Dizziness and giddiness: Secondary | ICD-10-CM

## 2015-11-29 MED ORDER — ONDANSETRON HCL 4 MG PO TABS: 4.0000 mg | ORAL_TABLET | Freq: Three times a day (TID) | ORAL | 0 refills | Status: DC | PRN

## 2015-11-29 MED ORDER — DIAZEPAM 2 MG PO TABS: 2.0000 mg | ORAL_TABLET | Freq: Four times a day (QID) | ORAL | 0 refills | Status: DC | PRN

## 2015-11-29 MED ORDER — AZELASTINE HCL 0.1 % NA SOLN: 2.0000 | Freq: Two times a day (BID) | NASAL | 12 refills | Status: DC

## 2015-11-29 MED ORDER — DOXYCYCLINE HYCLATE 100 MG PO TABS: 100.0000 mg | ORAL_TABLET | Freq: Two times a day (BID) | ORAL | 0 refills | Status: DC

## 2015-11-29 NOTE — Progress Notes (Signed)
Patient presents to clinic today c/o > 1 week of sinus pressure, sinus pain with L ear pain and tooth pain. Has significant history of sinusitis. Also history of asthma but denies chest congestion, SOB or chest tightness. Denies fever, chills, recent travel or sick contact. Does note positional dizziness over the past few days, worse with turning head right. Notes ear pressure and popping. Is taking her chronic asthma and allergy medications with only some relief in symptoms.  Past Medical History:  Diagnosis Date  . Asthma, moderate persistent    sees Dr Irena CordsVan Winkle  . Diabetes mellitus    A1C 6.2---->  2009  . Family history of adverse reaction to anesthesia    sister had cardiac arrest with rhinoplasty  . Fibrocystic breast   . GERD (gastroesophageal reflux disease)   . HH (hiatus hernia) 09/2014   Dr. Ewing SchleinMagod  . History of hiatal hernia   . Hot flashes   . Hyperlipidemia   . Normal nuclear stress test 2006  . Palpitations    PACs sees cards   . Recurrent cystitis    took  postcoital antibiotic for years, symptoms better after her hysterectomy 1996, symptoms re-surface in 2011  . RUQ abdominal pain 2016   EGD showed small HH, otherwise normal  . Wears glasses     Current Outpatient Prescriptions on File Prior to Visit  Medication Sig Dispense Refill  . albuterol (PROVENTIL HFA;VENTOLIN HFA) 108 (90 BASE) MCG/ACT inhaler Inhale 2 puffs into the lungs every 6 (six) hours as needed.    Marland Kitchen. aspirin EC 81 MG tablet Take 81 mg by mouth daily.    . bifidobacterium infantis (ALIGN) capsule Take 1 capsule by mouth daily.    . budesonide-formoterol (SYMBICORT) 160-4.5 MCG/ACT inhaler Inhale 2 puffs into the lungs 2 (two) times daily.    Marland Kitchen. diltiazem (CARDIZEM CD) 120 MG 24 hr capsule Take 1 capsule (120 mg total) by mouth daily. 90 capsule 3  . fexofenadine (ALLEGRA) 180 MG tablet Take 180 mg by mouth every morning.     . fluticasone (FLONASE) 50 MCG/ACT nasal spray Place 2 sprays into the  nose daily.      . hydrocortisone 2.5 % cream Apply topically 2 (two) times daily as needed. 30 g 0  . metFORMIN (GLUCOPHAGE) 1000 MG tablet Take 1.5 tablets by mouth every morning and 1 tablet by mouth every evening. 225 tablet 1  . montelukast (SINGULAIR) 10 MG tablet Take 10 mg by mouth at bedtime.      . Multiple Vitamins-Minerals (MULTIVITAMIN WITH MINERALS) tablet Take 1 tablet by mouth daily.    . nitrofurantoin, macrocrystal-monohydrate, (MACROBID) 100 MG capsule Take as directed, Post coital 30 capsule 0  . omeprazole (PRILOSEC) 40 MG capsule Take 1 capsule (40 mg total) by mouth 2 (two) times daily. (Patient taking differently: Take 40 mg by mouth daily. ) 180 capsule 3  . PREMARIN vaginal cream INSERT 1 APPLICATORFUL PER VAGINA  TWICE WEEKLY ON TUESDAYS AND FRIDAYS  0  . rosuvastatin (CRESTOR) 5 MG tablet Take 1 tablet (5 mg total) by mouth daily. 90 tablet 3  . sitaGLIPtin (JANUVIA) 100 MG tablet Take 1 tablet (100 mg total) by mouth daily. 30 tablet 8  . Tiotropium Bromide Monohydrate (SPIRIVA RESPIMAT) 2.5 MCG/ACT AERS Inhale 2 puffs into the lungs daily.     No current facility-administered medications on file prior to visit.     Allergies  Allergen Reactions  . Butorphanol Tartrate Other (See Comments)  REACTION: hallucinations  . Hydrocodone Other (See Comments)    HALLUCINATIONS  . Roxicet [Oxycodone-Acetaminophen] Other (See Comments)    HALLUCINATIONS  . Penicillins Hives    Family History  Problem Relation Age of Onset  . Mitral valve prolapse Mother   . Breast cancer Mother     M had ca insitu (age 73), aunt   . Dementia Mother   . Heart disease Father     early   . Diabetes Father   . Kidney failure Father   . Brain cancer Other     2 fam members   . Lung cancer Other     uncle, heavy smoker  . Colon cancer Neg Hx     Social History   Social History  . Marital status: Married    Spouse name: N/A  . Number of children: 1  . Years of education:  N/A   Occupational History  . retired principal    Social History Main Topics  . Smoking status: Never Smoker  . Smokeless tobacco: Never Used  . Alcohol use No  . Drug use: No  . Sexual activity: Yes   Other Topics Concern  . Not on file   Social History Narrative   Household-- pt and husband    Review of Systems - See HPI.  All other ROS are negative.  There were no vitals taken for this visit.  Physical Exam  Constitutional: She is oriented to person, place, and time and well-developed, well-nourished, and in no distress.  HENT:  Head: Normocephalic and atraumatic.  Right Ear: External ear normal. Tympanic membrane is not erythematous and not bulging. A middle ear effusion is present.  Left Ear: Tympanic membrane is not erythematous and not bulging. A middle ear effusion is present.  Nose: Right sinus exhibits maxillary sinus tenderness. Left sinus exhibits maxillary sinus tenderness.  Mouth/Throat: Uvula is midline, oropharynx is clear and moist and mucous membranes are normal.  Eyes: Conjunctivae are normal.  Neck: Neck supple.  Cardiovascular: Normal rate, regular rhythm, normal heart sounds and intact distal pulses.   Pulmonary/Chest: Effort normal and breath sounds normal. No respiratory distress. She has no wheezes. She has no rales. She exhibits no tenderness.  Neurological: She is alert and oriented to person, place, and time.  Skin: Skin is warm and dry. No rash noted.  Psychiatric: Affect normal.  Vitals reviewed.   Recent Results (from the past 2160 hour(s))  HgB A1c     Status: Abnormal   Collection Time: 09/24/15  2:54 PM  Result Value Ref Range   Hgb A1c MFr Bld 8.0 (H) <5.7 %    Comment:   For someone without known diabetes, a hemoglobin A1c value of 6.5% or greater indicates that they may have diabetes and this should be confirmed with a follow-up test.   For someone with known diabetes, a value <7% indicates that their diabetes is well controlled  and a value greater than or equal to 7% indicates suboptimal control. A1c targets should be individualized based on duration of diabetes, age, comorbid conditions, and other considerations.   Currently, no consensus exists for use of hemoglobin A1c for diagnosis of diabetes for children.      Mean Plasma Glucose 183 mg/dL  Basic Metabolic Panel (BMET)     Status: Abnormal   Collection Time: 09/24/15  2:54 PM  Result Value Ref Range   Sodium 142 135 - 146 mmol/L   Potassium 3.9 3.5 - 5.3 mmol/L   Chloride  105 98 - 110 mmol/L   CO2 23 20 - 31 mmol/L   Glucose, Bld 195 (H) 65 - 99 mg/dL   BUN 11 7 - 25 mg/dL   Creat 1.61 0.96 - 0.45 mg/dL    Comment:   For patients > or = 57 years of age: The upper reference limit for Creatinine is approximately 13% higher for people identified as African-American.      Calcium 8.8 8.6 - 10.4 mg/dL  IgE     Status: None   Collection Time: 11/03/15  3:11 PM  Result Value Ref Range   IgE (Immunoglobulin E), Serum 32 <115 kU/L  CBC w/Diff     Status: Abnormal   Collection Time: 11/03/15  3:11 PM  Result Value Ref Range   WBC 11.5 (H) 4.0 - 10.5 K/uL   RBC 4.86 3.87 - 5.11 Mil/uL   Hemoglobin 13.5 12.0 - 15.0 g/dL   HCT 40.9 81.1 - 91.4 %   MCV 83.1 78.0 - 100.0 fl   MCHC 33.5 30.0 - 36.0 g/dL   RDW 78.2 (H) 95.6 - 21.3 %   Platelets 278.0 150.0 - 400.0 K/uL   Neutrophils Relative % 75.3 43.0 - 77.0 %   Lymphocytes Relative 17.3 12.0 - 46.0 %   Monocytes Relative 5.9 3.0 - 12.0 %   Eosinophils Relative 1.1 0.0 - 5.0 %   Basophils Relative 0.4 0.0 - 3.0 %   Neutro Abs 8.7 (H) 1.4 - 7.7 K/uL   Lymphs Abs 2.0 0.7 - 4.0 K/uL   Monocytes Absolute 0.7 0.1 - 1.0 K/uL   Eosinophils Absolute 0.1 0.0 - 0.7 K/uL   Basophils Absolute 0.0 0.0 - 0.1 K/uL    Assessment/Plan: 1. Acute bacterial sinusitis Rx doxycycline. Will add-on Astelin to chronic Flonase. Continue Allegra and singulair. Will avoid use of steroid due to uncontrolled diabetes. -  doxycycline (VIBRA-TABS) 100 MG tablet; Take 1 tablet (100 mg total) by mouth 2 (two) times daily.  Dispense: 20 tablet; Refill: 0 - azelastine (ASTELIN) 0.1 % nasal spray; Place 2 sprays into both nostrils 2 (two) times daily. Use in each nostril as directed  Dispense: 30 mL; Refill: 12  2. Vertigo Positional + 2/2 fluid behind TM. Rx Valium for vertigo. Zofran for nausea. Discussed steroids. Want to avoid due to DM, uncontrolled as well as recent multi courses of steroids. Epley maneuvers discussed. Patient is to start. Strict return precautions given.  - diazepam (VALIUM) 2 MG tablet; Take 1 tablet (2 mg total) by mouth every 6 (six) hours as needed for anxiety.  Dispense: 30 tablet; Refill: 0 - ondansetron (ZOFRAN) 4 MG tablet; Take 1 tablet (4 mg total) by mouth every 8 (eight) hours as needed for nausea or vomiting.  Dispense: 20 tablet; Refill: 0   Piedad Climes, [place]

## 2015-11-29 NOTE — Patient Instructions (Signed)
Please take antibiotic as directed.  Increase fluid intake.  Use Saline nasal spray.  Take a daily multivitamin. Continue allergy medications but start the Astelin spray.  Place a humidifier in the bedroom.    For vertigo, take the diazepam as directed for dizziness. The zofran is for nausea. Continue your Epley maneuvers. Symptoms should resolve as sinus infection and fluid resolve.  Please call or return clinic if symptoms are not improving. If anything acutely worsens, please go to the ER.  Sinusitis Sinusitis is redness, soreness, and swelling (inflammation) of the paranasal sinuses. Paranasal sinuses are air pockets within the bones of your face (beneath the eyes, the middle of the forehead, or above the eyes). In healthy paranasal sinuses, mucus is able to drain out, and air is able to circulate through them by way of your nose. However, when your paranasal sinuses are inflamed, mucus and air can become trapped. This can allow bacteria and other germs to grow and cause infection. Sinusitis can develop quickly and last only a short time (acute) or continue over a long period (chronic). Sinusitis that lasts for more than 12 weeks is considered chronic.  CAUSES  Causes of sinusitis include:  Allergies.  Structural abnormalities, such as displacement of the cartilage that separates your nostrils (deviated septum), which can decrease the air flow through your nose and sinuses and affect sinus drainage.  Functional abnormalities, such as when the small hairs (cilia) that line your sinuses and help remove mucus do not work properly or are not present. SYMPTOMS  Symptoms of acute and chronic sinusitis are the same. The primary symptoms are pain and pressure around the affected sinuses. Other symptoms include:  Upper toothache.  Earache.  Headache.  Bad breath.  Decreased sense of smell and taste.  A cough, which worsens when you are lying flat.  Fatigue.  Fever.  Thick drainage from  your nose, which often is green and may contain pus (purulent).  Swelling and warmth over the affected sinuses. DIAGNOSIS  Your caregiver will perform a physical exam. During the exam, your caregiver may:  Look in your nose for signs of abnormal growths in your nostrils (nasal polyps).  Tap over the affected sinus to check for signs of infection.  View the inside of your sinuses (endoscopy) with a special imaging device with a light attached (endoscope), which is inserted into your sinuses. If your caregiver suspects that you have chronic sinusitis, one or more of the following tests may be recommended:  Allergy tests.  Nasal culture A sample of mucus is taken from your nose and sent to a lab and screened for bacteria.  Nasal cytology A sample of mucus is taken from your nose and examined by your caregiver to determine if your sinusitis is related to an allergy. TREATMENT  Most cases of acute sinusitis are related to a viral infection and will resolve on their own within 10 days. Sometimes medicines are prescribed to help relieve symptoms (pain medicine, decongestants, nasal steroid sprays, or saline sprays).  However, for sinusitis related to a bacterial infection, your caregiver will prescribe antibiotic medicines. These are medicines that will help kill the bacteria causing the infection.  Rarely, sinusitis is caused by a fungal infection. In theses cases, your caregiver will prescribe antifungal medicine. For some cases of chronic sinusitis, surgery is needed. Generally, these are cases in which sinusitis recurs more than 3 times per year, despite other treatments. HOME CARE INSTRUCTIONS   Drink plenty of water. Water helps thin  the mucus so your sinuses can drain more easily.  Use a humidifier.  Inhale steam 3 to 4 times a day (for example, sit in the bathroom with the shower running).  Apply a warm, moist washcloth to your face 3 to 4 times a day, or as directed by your  caregiver.  Use saline nasal sprays to help moisten and clean your sinuses.  Take over-the-counter or prescription medicines for pain, discomfort, or fever only as directed by your caregiver. SEEK IMMEDIATE MEDICAL CARE IF:  You have increasing pain or severe headaches.  You have nausea, vomiting, or drowsiness.  You have swelling around your face.  You have vision problems.  You have a stiff neck.  You have difficulty breathing. MAKE SURE YOU:   Understand these instructions.  Will watch your condition.  Will get help right away if you are not doing well or get worse. Document Released: 01/23/2005 Document Revised: 04/17/2011 Document Reviewed: 02/07/2011 [facility] Patient Information 2014 [employer] [place].

## 2015-12-03 ENCOUNTER — Encounter: Payer: Self-pay | Admitting: Internal Medicine

## 2015-12-08 ENCOUNTER — Other Ambulatory Visit: Payer: Self-pay | Admitting: Internal Medicine

## 2015-12-27 ENCOUNTER — Other Ambulatory Visit: Payer: Self-pay | Admitting: Internal Medicine

## 2016-01-05 ENCOUNTER — Other Ambulatory Visit (INDEPENDENT_AMBULATORY_CARE_PROVIDER_SITE_OTHER): Payer: BC Managed Care – PPO

## 2016-01-05 DIAGNOSIS — E118 Type 2 diabetes mellitus with unspecified complications: Secondary | ICD-10-CM

## 2016-01-05 LAB — HEMOGLOBIN A1C: Hgb A1c MFr Bld: 7 % — ABNORMAL HIGH (ref 4.6–6.5)

## 2016-01-10 ENCOUNTER — Other Ambulatory Visit: Payer: Self-pay

## 2016-01-10 MED ORDER — GLUCOSE BLOOD VI STRP: ORAL_STRIP | 12 refills | Status: DC

## 2016-01-17 LAB — TSH: TSH: 0.9 u[IU]/mL (ref 0.41–5.90)

## 2016-01-24 LAB — HM COLONOSCOPY

## 2016-01-27 ENCOUNTER — Encounter: Payer: Self-pay | Admitting: Internal Medicine

## 2016-01-28 ENCOUNTER — Other Ambulatory Visit: Payer: Self-pay | Admitting: Internal Medicine

## 2016-01-28 ENCOUNTER — Encounter: Payer: Self-pay | Admitting: Internal Medicine

## 2016-01-28 ENCOUNTER — Ambulatory Visit (INDEPENDENT_AMBULATORY_CARE_PROVIDER_SITE_OTHER): Payer: BC Managed Care – PPO | Admitting: Internal Medicine

## 2016-01-28 VITALS — BP 124/78 | HR 98 | Temp 98.1°F | Resp 14 | Ht 64.0 in | Wt 188.2 lb

## 2016-01-28 DIAGNOSIS — L659 Nonscarring hair loss, unspecified: Secondary | ICD-10-CM | POA: Diagnosis not present

## 2016-01-28 DIAGNOSIS — D72829 Elevated white blood cell count, unspecified: Secondary | ICD-10-CM

## 2016-01-28 DIAGNOSIS — R5382 Chronic fatigue, unspecified: Secondary | ICD-10-CM | POA: Diagnosis not present

## 2016-01-28 DIAGNOSIS — G4730 Sleep apnea, unspecified: Secondary | ICD-10-CM

## 2016-01-28 LAB — CBC WITH DIFFERENTIAL/PLATELET
BASOS ABS: 0 {cells}/uL (ref 0–200)
Basophils Relative: 0 %
EOS ABS: 210 {cells}/uL (ref 15–500)
Eosinophils Relative: 3 %
HCT: 38.5 % (ref 35.0–45.0)
HEMOGLOBIN: 12.3 g/dL (ref 11.7–15.5)
LYMPHS ABS: 1890 {cells}/uL (ref 850–3900)
Lymphocytes Relative: 27 %
MCH: 26.3 pg — AB (ref 27.0–33.0)
MCHC: 31.9 g/dL — ABNORMAL LOW (ref 32.0–36.0)
MCV: 82.4 fL (ref 80.0–100.0)
MPV: 10 fL (ref 7.5–12.5)
Monocytes Absolute: 490 cells/uL (ref 200–950)
Monocytes Relative: 7 %
NEUTROS ABS: 4410 {cells}/uL (ref 1500–7800)
Neutrophils Relative %: 63 %
Platelets: 336 10*3/uL (ref 140–400)
RBC: 4.67 MIL/uL (ref 3.80–5.10)
RDW: 13.9 % (ref 11.0–15.0)
WBC: 7 10*3/uL (ref 3.8–10.8)

## 2016-01-28 LAB — FOLATE: Folate: 20.6 ng/mL (ref >5.4)

## 2016-01-28 LAB — VITAMIN B12: Vitamin B-12: 365 pg/mL (ref 200–1100)

## 2016-01-28 NOTE — Progress Notes (Signed)
Subjective:    Patient ID: Karen Barnes, female    DOB: 03/27/1960, 55 y.o.   MRN: [id number]  DOS:  01/28/2016 Type of visit - description : Acute visit Interval history:  2 months history of hair loss, "falling out in clumps", very concerned about it. ? related to her thyroid. See next Went to gynecology, TSH was borderline abnormal. They plan to repeat it in 3 months. Also, complaining of feeling very fatigued, tired, worse lately. When asked, admits to some snoring, on and off, sometimes very loud. Also, her daughter is concerned about her blood counts. On chart review, the  last CBC showed elevated WBC. Also, libido has been decrease, gynecology just started her on testosterone, first dose 2 days ago   Review of Systems  Other than above, feels at baseline.  Past Medical History:  Diagnosis Date  . Asthma, moderate persistent    sees Dr Irena Cords  . Diabetes mellitus    A1C 6.2---->  2009  . Family history of adverse reaction to anesthesia    sister had cardiac arrest with rhinoplasty  . Fibrocystic breast   . GERD (gastroesophageal reflux disease)   . HH (hiatus hernia) 09/2014   Dr. Ewing Schlein  . History of hiatal hernia   . Hot flashes   . Hyperlipidemia   . Normal nuclear stress test 2006  . Palpitations    PACs sees cards   . Recurrent cystitis    took  postcoital antibiotic for years, symptoms better after her hysterectomy 1996, symptoms re-surface in 2011  . RUQ abdominal pain 2016   EGD showed small HH, otherwise normal  . Wears glasses     Past Surgical History:  Procedure Laterality Date  . ABDOMINAL HYSTERECTOMY  1996   due to endometriosis and fibroids approximately 1996  . BREAST LUMPECTOMY WITH RADIOACTIVE SEED LOCALIZATION Right 03/12/2014   Procedure: BREAST LUMPECTOMY WITH RADIOACTIVE SEED LOCALIZATION;  Surgeon: Harriette Bouillon, MD;  Location: [facility];  Service: General;  Laterality: Right;  . BREAST SURGERY     Lumpectomy--  BENIGN   . CATARACT EXTRACTION Bilateral 2011  . CESAREAN SECTION    . DILATION AND CURETTAGE OF UTERUS    . LIPOMA EXCISION  1978  . UPPER GASTROINTESTINAL ENDOSCOPY    . UPPER GI ENDOSCOPY  09/11/2014   Small Hiatus Hernia, Dr. Ewing Schlein    Social History   Social History  . Marital status: Married    Spouse name: N/A  . Number of children: 1  . Years of education: N/A   Occupational History  . retired principal    Social History Main Topics  . Smoking status: Never Smoker  . Smokeless tobacco: Never Used  . Alcohol use No  . Drug use: No  . Sexual activity: Yes   Other Topics Concern  . Not on file   Social History Narrative   Household-- pt and husband       Allergies as of 01/28/2016      Reactions   Butorphanol Tartrate Other (See Comments)   REACTION: hallucinations   Hydrocodone Other (See Comments)   HALLUCINATIONS   Roxicet [oxycodone-acetaminophen] Other (See Comments)   HALLUCINATIONS   Penicillins Hives   Zofran [ondansetron Hcl] Other (See Comments)   Hair loss      Medication List       Accurate as of 01/28/16 11:59 PM. Always use your most recent med list.          albuterol  108 (90 Base) MCG/ACT inhaler Commonly known as:  PROVENTIL HFA;VENTOLIN HFA Inhale 2 puffs into the lungs every 6 (six) hours as needed.   ANDROGEL TD Place onto the skin. Per GYN   aspirin EC 81 MG tablet Take 81 mg by mouth daily.   bifidobacterium infantis capsule Take 1 capsule by mouth daily.   diazepam 2 MG tablet Commonly known as:  VALIUM Take 1 tablet (2 mg total) by mouth every 6 (six) hours as needed for anxiety.   diltiazem 120 MG 24 hr capsule Commonly known as:  CARDIZEM CD Take 1 capsule (120 mg total) by mouth daily.   fexofenadine 180 MG tablet Commonly known as:  ALLEGRA Take 180 mg by mouth every morning.   fluticasone 50 MCG/ACT nasal spray Commonly known as:  FLONASE Place 2 sprays into the nose daily.   glucose blood test  strip Check blood sugar no more than twice daily   hydrocortisone 2.5 % cream APPLY EXTERNALLY TO THE AFFECTED AREA TWICE DAILY AS NEEDED   metFORMIN 1000 MG tablet Commonly known as:  GLUCOPHAGE Take 1.5 tablets by mouth every morning and 1 tablet by mouth every evening.   montelukast 10 MG tablet Commonly known as:  SINGULAIR Take 10 mg by mouth at bedtime.   MUCUS RELIEF ER 600 MG 12 hr tablet Generic drug:  guaiFENesin Take 1 tablet by mouth 2 (two) times daily as needed.   multivitamin with minerals tablet Take 1 tablet by mouth daily.   nitrofurantoin (macrocrystal-monohydrate) 100 MG capsule Commonly known as:  MACROBID TAKE AS DIRECTED POST COITAL   omeprazole 40 MG capsule Commonly known as:  PRILOSEC Take 1 capsule (40 mg total) by mouth 2 (two) times daily.   ONETOUCH DELICA LANCETS 33G Misc Check blood sugar no more than twice daily   PREMARIN vaginal cream Generic drug:  conjugated estrogens INSERT 1 APPLICATORFUL PER VAGINA  TWICE WEEKLY ON TUESDAYS AND FRIDAYS   PRESCRIPTION MEDICATION Allergy Shots   rosuvastatin 5 MG tablet Commonly known as:  CRESTOR Take 1 tablet (5 mg total) by mouth daily.   sitaGLIPtin 100 MG tablet Commonly known as:  JANUVIA Take 1 tablet (100 mg total) by mouth daily.   SPIRIVA RESPIMAT 2.5 MCG/ACT Aers Generic drug:  Tiotropium Bromide Monohydrate Inhale 2 puffs into the lungs daily.   SYMBICORT 160-4.5 MCG/ACT inhaler Generic drug:  budesonide-formoterol Inhale 2 puffs into the lungs 2 (two) times daily.          Objective:   Physical Exam BP 124/78 (BP Location: Left Arm, Patient Position: Sitting, Cuff Size: Normal)   Pulse 98   Temp 98.1 F (36.7 C) (Oral)   Resp 14   Ht 5\' 4"  (1.626 m)   Wt 188 lb 4 oz (85.4 kg)   SpO2 98%   BMI 32.31 kg/m  General:   Well developed, well nourished . NAD.  HEENT:  Normocephalic . Face symmetric, atraumatic Neck-no thyromegaly  Skin: Scalp looks healthy, she  does have thinning of her hair in a female pattern. Neurologic:  alert & oriented X3.  Speech normal, gait appropriate for age and unassisted Psych--  Cognition and judgment appear intact.  Cooperative with normal attention span and concentration.  Behavior appropriate. No anxious or depressed appearing.      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 For completeness we'll check a RPR. She already has an appointment to see dermatology.  Mildly  abnormal TSH: Recommend to recheck by gynecology as planned, will try to get results  Fatigue : Unlikely to be explained by a slightly off thyroid level. She has risk factor for OSA, epworth done today scale 9 --->  positive. Refer to neuro. for completeness we'll check B12, folic acid, vitamin D. Recent leukocytosis: Patient's daughter is concerned about her blood counts. Recheck a CBC Low  libido: Also reports she has decreased libido, gyn started testosterone  2 days ago    RTC 03-2016 CPX

## 2016-01-28 NOTE — Progress Notes (Signed)
Pre visit review using our clinic review tool, if applicable. No additional management support is needed unless otherwise documented below in the visit note. 

## 2016-01-28 NOTE — Patient Instructions (Signed)
GO TO THE LAB : Get the blood work     GO TO THE FRONT DESK Schedule your next appointment for a  physical exam by 03-2016  

## 2016-01-29 LAB — RPR

## 2016-01-30 NOTE — Assessment & Plan Note (Signed)
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 For completeness we'll check a RPR. She already has an appointment to see dermatology.  Mildly  abnormal TSH: Recommend to recheck by gynecology as planned, will try to get results  Fatigue : Unlikely to be explained by a slightly off thyroid level. She has risk factor for OSA, epworth done today scale 9 --->  positive. Refer to neuro. for completeness we'll check B12, folic acid, vitamin D. Recent leukocytosis: Patient's daughter is concerned about her blood counts. Recheck a CBC Low  libido: Also reports she has decreased libido, gyn started testosterone  2 days ago    RTC 03-2016 CPX

## 2016-02-02 LAB — VITAMIN D 1,25 DIHYDROXY
VITAMIN D 1, 25 (OH) TOTAL: 23 pg/mL (ref 18–72)
VITAMIN D3 1, 25 (OH): 23 pg/mL
Vitamin D2 1, 25 (OH)2: <8 pg/mL

## 2016-02-03 ENCOUNTER — Ambulatory Visit (INDEPENDENT_AMBULATORY_CARE_PROVIDER_SITE_OTHER): Payer: BC Managed Care – PPO | Admitting: Neurology

## 2016-02-03 ENCOUNTER — Telehealth: Payer: Self-pay

## 2016-02-03 ENCOUNTER — Encounter: Payer: Self-pay | Admitting: Neurology

## 2016-02-03 VITALS — BP 118/72 | HR 82 | Resp 16 | Ht 64.0 in | Wt 186.0 lb

## 2016-02-03 DIAGNOSIS — J4521 Mild intermittent asthma with (acute) exacerbation: Secondary | ICD-10-CM

## 2016-02-03 DIAGNOSIS — F5109 Other insomnia not due to a substance or known physiological condition: Secondary | ICD-10-CM | POA: Diagnosis not present

## 2016-02-03 DIAGNOSIS — M25471 Effusion, right ankle: Secondary | ICD-10-CM

## 2016-02-03 DIAGNOSIS — M25472 Effusion, left ankle: Secondary | ICD-10-CM | POA: Diagnosis not present

## 2016-02-03 DIAGNOSIS — G4733 Obstructive sleep apnea (adult) (pediatric): Secondary | ICD-10-CM | POA: Diagnosis not present

## 2016-02-03 DIAGNOSIS — F519 Sleep disorder not due to a substance or known physiological condition, unspecified: Secondary | ICD-10-CM

## 2016-02-03 NOTE — Telephone Encounter (Signed)
[facility] [place] [facility] [place]. Forwarded to PCP for review on return to office.

## 2016-02-03 NOTE — Progress Notes (Signed)
SLEEP MEDICINE CLINIC   Provider:  Larey Seat, M D  Referring Provider: Colon Branch, MD Primary Care Physician:  Kathlene November, MD  Chief Complaint  Patient presents with  . Sleep Consult    Rm 10. Patient wakes up and goes to the bathroom many times during the night, wakes up coughing/choking, snores, wakes up feeling tired, daytime fatigue, falls asleep when sitting still. Father was diagnosed with OSA.     HPI:  Karen Barnes is a 55 y.o. female , seen here as a referral from Dr. Larose Kells for a sleep evaluation, in a patient with asthma, obesity, GERD, DM, Cystitis,  Hot flashes, sinusitis, allergic reaction to mold.   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 Several of the buildings in which she worked were infested with mold and she developed at age 38 late onset asthma. Allergy testing reveals that mold and dust I her main triggers at this point. In addition she is here because of hypersomnia, falling easily asleep when traveling by car for longer distances as a passenger. She feels always fatigued and she snores.    Sleep habits are as follows: The patient reports that she and her husband both likes to watch late-night TV and that the usual bedtime will be around midnight, sometimes a TV will be on an pulse will fall asleep. They do set a timer to the TV though. She prefers to sleep on one of her size, sometimes will find herself supine. She can usually sleep was one pillow but uses to if she had a late night dinner to avoid reflux, or if she drink caffeine later in the day. Her husband reports that she snores, some nights she will not. She has often woken from choking sensations, having to clear her throat coughing but then being able to go back to sleep. On occasion this is associated with the feeling of  heartburn or reflux. She describes the feeling as if strangled. She has frequent nocturia. She rises at 7 AM. On Th and Fr she rises at 5.30 to take care of the granddaughter.  Her husband has a job in retirement.    Sleep medical history and family sleep history: father has OSA, but does not use CPAP -DM . Paternal family has strong history of sudden cardiac death, CAD, bypass surgery. Nocturia.  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 She does not drink alcohol,, she is a nonsmoker and does not use any tobacco products, her caffeine intake is moderate. She drinks decaffeinated soda, coffee and tea.  Review of Systems: Out of a complete 14 system review, the patient complains of only the following symptoms, and all other reviewed systems are negative. Snoring, EDS, fatigue and choking in sleep   Epworth score 11 , Fatigue severity score 48  , depression score 1/15    Social History   Social History  . Marital status: Married    Spouse name: N/A  . Number of children: 1  . Years of education: College   Occupational History  . retired principal    Social History Main Topics  . Smoking status: Never Smoker  . Smokeless tobacco: Never Used  . Alcohol use No  . Drug use: No  . Sexual activity: Yes   Other Topics Concern  . Not on file  Social History Narrative   Household-- pt and husband    Rare caffeine use    Family History  Problem Relation Age of Onset  . Mitral valve prolapse Mother   . Breast cancer Mother     M had ca insitu (age 27), aunt   . Dementia Mother   . Heart disease Father     early   . Diabetes Father   . Kidney failure Father   . Sleep apnea Father   . Brain cancer Other     2 fam members   . Lung cancer Other     uncle, heavy smoker  . Colon cancer Neg Hx     Past Medical History:  Diagnosis Date  . Asthma, moderate persistent    sees Dr Harold Hedge  . Diabetes mellitus    A1C 6.2---->  2009  . Family history of  adverse reaction to anesthesia    sister had cardiac arrest with rhinoplasty  . Fibrocystic breast   . GERD (gastroesophageal reflux disease)   . HH (hiatus hernia) 09/2014   Dr. [name]  . History of hiatal hernia   . Hot flashes   . Hyperlipidemia   . Normal nuclear stress test 2006  . Palpitations    PACs sees cards   . Recurrent cystitis    took  postcoital antibiotic for years, symptoms better after her hysterectomy 1996, symptoms re-surface in 2011  . RUQ abdominal pain 2016   EGD showed small HH, otherwise normal  . Wears glasses     Past Surgical History:  Procedure Laterality Date  . ABDOMINAL HYSTERECTOMY  1996   due to endometriosis and fibroids approximately 1996  . BREAST LUMPECTOMY WITH RADIOACTIVE SEED LOCALIZATION Right 03/12/2014   Procedure: BREAST LUMPECTOMY WITH RADIOACTIVE SEED LOCALIZATION;  Surgeon: Erroll Luna, MD;  Location: [facility];  Service: General;  Laterality: Right;  . BREAST SURGERY     Lumpectomy-- BENIGN   . CATARACT EXTRACTION Bilateral 2011  . CESAREAN SECTION    . DILATION AND CURETTAGE OF UTERUS    . LIPOMA EXCISION  1978  . UPPER GASTROINTESTINAL ENDOSCOPY    . UPPER GI ENDOSCOPY  09/11/2014   Small Hiatus Hernia, Dr. [name]    Current Outpatient Prescriptions  Medication Sig Dispense Refill  . albuterol (PROVENTIL HFA;VENTOLIN HFA) 108 (90 BASE) MCG/ACT inhaler Inhale 2 puffs into the lungs every 6 (six) hours as needed.    Marland Kitchen aspirin EC 81 MG tablet Take 81 mg by mouth daily.    . bifidobacterium infantis (ALIGN) capsule Take 1 capsule by mouth daily.    . budesonide-formoterol (SYMBICORT) 160-4.5 MCG/ACT inhaler Inhale 2 puffs into the lungs 2 (two) times daily.    Marland Kitchen diltiazem (CARDIZEM CD) 120 MG 24 hr capsule Take 1 capsule (120 mg total) by mouth daily. 90 capsule 3  . fexofenadine (ALLEGRA) 180 MG tablet Take 180 mg by mouth every morning.     . fluticasone (FLONASE) 50 MCG/ACT nasal spray Place 2 sprays into the  nose daily.      Marland Kitchen glucose blood test strip Check blood sugar no more than twice daily 100 each 12  . hydrocortisone 2.5 % cream APPLY EXTERNALLY TO THE AFFECTED AREA TWICE DAILY AS NEEDED 30 g 0  . metFORMIN (GLUCOPHAGE) 1000 MG tablet Take 1.5 tablets by mouth every morning and 1 tablet by mouth every evening. 225 tablet 1  . montelukast (SINGULAIR) 10 MG tablet Take 10 mg by mouth at bedtime.      Marland Kitchen  MUCUS RELIEF ER 600 MG 12 hr tablet Take 1 tablet by mouth 2 (two) times daily as needed.  0  . Multiple Vitamins-Minerals (MULTIVITAMIN WITH MINERALS) tablet Take 1 tablet by mouth daily.    . nitrofurantoin, macrocrystal-monohydrate, (MACROBID) 100 MG capsule TAKE AS DIRECTED POST COITAL 30 capsule 0  . omeprazole (PRILOSEC) 40 MG capsule Take 1 capsule (40 mg total) by mouth 2 (two) times daily. (Patient taking differently: Take 40 mg by mouth daily. ) 180 capsule 3  . ONETOUCH DELICA LANCETS 85I MISC Check blood sugar no more than twice daily 100 each 12  . PREMARIN vaginal cream INSERT 1 APPLICATORFUL PER VAGINA  TWICE WEEKLY ON TUESDAYS AND FRIDAYS  0  . PRESCRIPTION MEDICATION Allergy Shots    . rosuvastatin (CRESTOR) 5 MG tablet Take 1 tablet (5 mg total) by mouth daily. 90 tablet 3  . sitaGLIPtin (JANUVIA) 100 MG tablet Take 1 tablet (100 mg total) by mouth daily. 30 tablet 8  . Testosterone (ANDROGEL TD) Place onto the skin. Per GYN    . Tiotropium Bromide Monohydrate (SPIRIVA RESPIMAT) 2.5 MCG/ACT AERS Inhale 2 puffs into the lungs daily.     No current facility-administered medications for this visit.     Allergies as of 02/03/2016 - Review Complete 02/03/2016  Allergen Reaction Noted  . Butorphanol tartrate Other (See Comments) 07/02/2009  . Hydrocodone Other (See Comments) 03/06/2014  . Roxicet [oxycodone-acetaminophen] Other (See Comments) 08/16/2014  . Penicillins Hives 03/07/2007  . Zofran [ondansetron hcl] Other (See Comments) 01/28/2016    Vitals: BP 118/72   Pulse 82    Resp 16   Ht '5\' 4"'$  (1.626 m)   Wt 186 lb (84.4 kg)   BMI 31.93 kg/m  Last Weight:  Wt Readings from Last 1 Encounters:  02/03/16 186 lb (84.4 kg)   OEV:OJJK mass index is 31.93 kg/m.     Last Height:   Ht Readings from Last 1 Encounters:  02/03/16 '5\' 4"'$  (1.626 m)    Physical exam:  General: The patient is awake, alert and appears not in acute distress. The patient is well groomed. Head: Normocephalic, atraumatic. Neck is supple. Mallampati 3  neck circumference:16. Nasal airflow patent , Retrognathia is not seen.  Cardiovascular:  Regular rate and rhythm , without  murmurs or carotid bruit, and without distended neck veins. Respiratory: Lungs are clear to auscultation. Skin:  With evidence of ankle edema, or rash Trunk: BMI is elevated.   The patient's posture is erect   Neurologic exam : The patient is awake and alert, oriented to place and time.   Memory subjective  described as intact.  Attention span & concentration ability appears normal.  Speech is fluent,  without dysarthria, dysphonia or aphasia.  Mood and affect are appropriate.  Cranial nerves: Pupils are equal and briskly reactive to light. Funduscopic exam without evidence of pallor or edema. Extraocular movements  in vertical and horizontal planes intact and without nystagmus. Visual fields by finger perimetry are intact. Hearing to finger rub intact. Facial sensation intact to fine touch.Facial motor strength is symmetric and tongue and uvula move midline. Shoulder shrug was symmetrical.   Motor exam:  Normal tone, muscle bulk and symmetric strength in all extremities.  Sensory:  Proprioception tested in the upper extremities was normal.  Coordination:  Finger-to-nose maneuver  normal without evidence of ataxia, dysmetria or tremor.  Gait and station: Patient walks without assistive device and is able unassisted to climb up to the exam table. Strength within  normal limits.  Stance is stable and normal.     Deep tendon reflexes: in the  upper and lower extremities are symmetric and intact. Babinski maneuver response is downgoing.  The patient was advised of the nature of the diagnosed sleep disorder , the treatment options and risks for general a health and wellness arising from not treating the condition.  I spent more than 40 minutes of face to face time with the patient. Greater than 50% of time was spent in counseling and coordination of care. We have discussed the diagnosis and differential and I answered the patient's questions.     Assessment:  After physical and neurologic examination, review of laboratory studies,  Personal review of imaging studies, reports of other /same  Imaging studies ,  Results of polysomnography/ neurophysiology testing and pre-existing records as far as provided in visit., my assessment is   1) I believe that Mrs. Pitz has several risk factors for obstructive sleep apnea including her body mass index, her her neck circumference, a borderline Mallampati. She also has respiratory risk factors by having an underlying asthmatic condition and being prone to upper respiratory infections. Allergic rhinitis is also present sometimes forcing her to mouth breathe. Her husband has noted her to snore, and she has experienced choking. She also has developed some ankle edema which was more prominent on the right than left foot today.   Plan:  Treatment plan and additional workup :  Mrs. Fentress has met her deductible and co-pay obligations for 2017, I would love to offer her this calendar year and attended sleep study but I don't think that is realistic, I can ask if we can get a home sleep test for her to screen her for sleep apnea. The test would be evaluated in 2018 but would be performed within 2017. If this is not possible I will order an attended sleep study for the coming year.     Asencion Partridge Achille Xiang MD  02/03/2016   CC: Colon Branch, [place] [address]  [place], [place] [address]

## 2016-02-03 NOTE — Telephone Encounter (Signed)
-----   Message from Wanda PlumpJose E Paz, MD sent at 01/30/2016  8:42 AM EST ----- Regarding: Results Call Gynecology, Dr. Renaldo FiddlerAdkins, try to get the blood work results, TSH was abnormal

## 2016-02-08 ENCOUNTER — Encounter: Payer: Self-pay | Admitting: Internal Medicine

## 2016-02-08 NOTE — Telephone Encounter (Signed)
Labs reviewed: TSH 0.9 (normal). other TFTs normal as well.

## 2016-02-20 ENCOUNTER — Ambulatory Visit (INDEPENDENT_AMBULATORY_CARE_PROVIDER_SITE_OTHER): Payer: BC Managed Care – PPO | Admitting: Neurology

## 2016-02-20 DIAGNOSIS — F519 Sleep disorder not due to a substance or known physiological condition, unspecified: Secondary | ICD-10-CM

## 2016-02-20 DIAGNOSIS — M25471 Effusion, right ankle: Secondary | ICD-10-CM

## 2016-02-20 DIAGNOSIS — F5109 Other insomnia not due to a substance or known physiological condition: Secondary | ICD-10-CM

## 2016-02-20 DIAGNOSIS — J4521 Mild intermittent asthma with (acute) exacerbation: Secondary | ICD-10-CM

## 2016-02-20 DIAGNOSIS — G4733 Obstructive sleep apnea (adult) (pediatric): Secondary | ICD-10-CM | POA: Diagnosis not present

## 2016-02-20 DIAGNOSIS — M25472 Effusion, left ankle: Secondary | ICD-10-CM

## 2016-02-25 NOTE — Procedures (Signed)
PATIENT'S NAME:  Karen Barnes, Karen Barnes DOB:      1960-06-25      MR#:    [id number]     DATE OF RECORDING: 02/20/2016 REFERRING M.D.:  Willow OraJose Paz, MD Study Performed:   Baseline Polysomnogram HISTORY:  Chief complaint of Hypersomnia   Mrs. Karen Barnes developed at age 56 late onset asthma. Allergy testing reveals that mold and dust are her main triggers at this point. In addition she is here because of hypersomnia, falling easily asleep when traveling by car for longer distances as a passenger. She feels always fatigued when she wakes up, not refreshed or restored and she snores.     Asthma, Diabetes, Fibrocystic breast, GERD, Hiatal hernia, Hyperlipidemia, Palpitations, Recurrent cystitis, RUQ abdominal pain, Hysterectomy, Breast lumpectomy, Cataract extraction, Cesarean, D&C, Lipoma extraction, Upper endoscopy  The patient endorsed the Epworth Sleepiness Scale at 11 points, the FSS at 48.  The patient's weight 188 pounds with a height of 64 (inches), resulting in a BMI of 32. kg/m2. The patient's neck circumference measured 16 inches.  CURRENT MEDICATIONS: Albuterol, ASA 81mg , Align, Symbicort, Cardizem, Allegra, Flonase, Hydrocortisone cream, Glucophage, Singulair, Multivitamins, Macrobid, Prilosec, Premarin, Crestor, Januvia, Androgel, Spiriva   PROCEDURE:  This is a multichannel digital polysomnogram utilizing the Somnostar 11.2 system.  Electrodes and sensors were applied and monitored per AASM Specifications.   EEG, EOG, Chin and Limb EMG, were sampled at 200 Hz.  ECG, Snore and Nasal Pressure, Thermal Airflow, Respiratory Effort, CPAP Flow and Pressure, Oximetry was sampled at 50 Hz. Digital video and audio were recorded.      BASELINE STUDY  Lights Out was at 22:41 and Lights On at 05:25.  Total recording time (TRT) was 404 minutes, with a total sleep time (TST) of 346.5 minutes.   The patient's sleep latency was 34 minutes.  REM latency was 100.5 minutes.  The sleep efficiency was 85.8 %.     SLEEP  ARCHITECTURE: WASO (Wake after sleep onset) was 34.5 minutes.  There were 25.5 minutes in Stage N1, 144 minutes Stage N2, 118 minutes Stage N3 and 59 minutes in Stage REM.  The percentage of Stage N1 was 7.4%, Stage N2 was 41.6%, Stage N3 was 34.1% and Stage R (REM sleep) was 17.%.   RESPIRATORY ANALYSIS:  There were a total of 21 respiratory events:  1 obstructive apneas, 0 central apneas and 0 mixed apneas with a total of 1 apneas and an apnea index (AI) of .2 /hour. There were 20 hypopneas with a hypopnea index of 3.5 /hour. The patient also had 0 respiratory event related arousals (RERAs).      The total APNEA/HYPOPNEA INDEX (AHI) was 3.6/hour and the total RESPIRATORY DISTURBANCE INDEX was 3.6 /hour.  17 events occurred in REM sleep and 6 events in NREM. The REM AHI was 17.3 /hour, versus a non-REM AHI of 0.8. The patient spent 93.5 minutes of total sleep time in the supine position and 253 minutes in non-supine. The supine AHI was 10.2 versus a non-supine AHI of 1.2.  OXYGEN SATURATION & C02:  The Wake baseline 02 saturation was 94%, with the lowest being 85%. Time spent below 89% saturation equaled 9 minutes.   PERIODIC LIMB MOVEMENTS:   The patient had a total of 0 Periodic Limb Movements.   The arousals were noted as: 18 were spontaneous, 0 were associated with PLMs, and 4 were associated with respiratory events.  Audio and video analysis did not show any abnormal or unusual movements, behaviors, phonations or vocalizations.  Snoring was noted. EKG was in keeping with normal sinus rhythm (NSR).   IMPRESSION:  1. Obstructive Sleep Apnea (OSA) only in REM sleep, overall AHI is too low to use CPAP.  2. Primary Snoring   RECOMMENDATIONS:  For an overall mild apnea, without hypoxemia and associated with snoring, an ENT procedure or a dental device can be helpful. Please note, these treat primarily snoring better than REM dependent apnea.  Weight loss will help as well.   1. Consider  dedicated sleep psychology referral if insomnia is of clinical concern.   2. A follow up appointment will be scheduled in the Sleep Clinic at [facility]. The referring provider will be notified of the results.      I certify that I have reviewed the entire raw data recording prior to the issuance of this report in accordance with the Standards of Accreditation of the American Academy of Sleep Medicine (AASM)      Melvyn Novas, MD  02-21-2016 Diplomat, American Board of Psychiatry and Neurology  Diplomat, American Board of Sleep Medicine Medical Director, [facility]

## 2016-02-28 ENCOUNTER — Telehealth: Payer: Self-pay

## 2016-02-28 NOTE — Telephone Encounter (Signed)
-----   Message from Melvyn Novasarmen Dohmeier, MD sent at 02/25/2016  1:07 PM EST ----- 1. Obstructive Sleep Apnea (OSA) only in REM sleep, overall AHI is too low to use CPAP.  2. Primary Snoring   RECOMMENDATIONS:  For an overall mild apnea, without hypoxemia and associated with snoring, an ENT procedure or a dental device can be helpful. Please note, these treat primarily snoring better than REM dependent apnea.  Weight loss will help as well.

## 2016-02-28 NOTE — Telephone Encounter (Signed)
I called pt. I advised her that her sleep study revealed osa only in REM sleep, but overall her AHI is too low to use CPAP. Dr. Vickey Hugerohmeier did note that she had primary snoring during her sleep study. Dr. Vickey Hugerohmeier recommends for her mild apnea without hypoxemia with snoring, either an ENT procedure or a dental device, but that these options treat primarily snoring better than the REM dependent apnea. Weight loss will help the pt as well. Pt says that her dentist will be able to fit her for a dental device and so she will speak with him. Pt declined a follow up appt with Dr. Vickey Hugerohmeier at this time. Pt verbalized understanding of results. Pt had no questions at this time but was encouraged to call back if questions arise.

## 2016-03-22 ENCOUNTER — Other Ambulatory Visit: Payer: Self-pay | Admitting: Internal Medicine

## 2016-03-28 ENCOUNTER — Encounter: Payer: Self-pay | Admitting: Internal Medicine

## 2016-03-28 ENCOUNTER — Ambulatory Visit (INDEPENDENT_AMBULATORY_CARE_PROVIDER_SITE_OTHER): Payer: BC Managed Care – PPO | Admitting: Internal Medicine

## 2016-03-28 VITALS — BP 118/76 | HR 71 | Temp 98.0°F | Resp 12 | Ht 64.0 in | Wt 183.1 lb

## 2016-03-28 DIAGNOSIS — Z0000 Encounter for general adult medical examination without abnormal findings: Secondary | ICD-10-CM | POA: Diagnosis not present

## 2016-03-28 DIAGNOSIS — E119 Type 2 diabetes mellitus without complications: Secondary | ICD-10-CM | POA: Diagnosis not present

## 2016-03-28 LAB — LIPID PANEL
CHOL/HDL RATIO: 3
Cholesterol: 145 mg/dL (ref 0–200)
HDL: 57.3 mg/dL (ref >39.00)
LDL CALC: 67 mg/dL (ref 0–99)
NonHDL: 87.3
TRIGLYCERIDES: 102 mg/dL (ref 0.0–149.0)
VLDL: 20.4 mg/dL (ref 0.0–40.0)

## 2016-03-28 LAB — MICROALBUMIN / CREATININE URINE RATIO
Creatinine,U: 36.8 mg/dL
Microalb Creat Ratio: 1.9 mg/g (ref 0.0–30.0)

## 2016-03-28 LAB — ALT: ALT: 13 U/L (ref 0–35)

## 2016-03-28 LAB — BASIC METABOLIC PANEL
BUN: 12 mg/dL (ref 6–23)
CO2: 28 mEq/L (ref 19–32)
Calcium: 9.2 mg/dL (ref 8.4–10.5)
Chloride: 105 mEq/L (ref 96–112)
Creatinine, Ser: 0.69 mg/dL (ref 0.40–1.20)
GFR: 93.05 mL/min (ref >60.00)
GLUCOSE: 90 mg/dL (ref 70–99)
POTASSIUM: 4.2 meq/L (ref 3.5–5.1)
Sodium: 140 mEq/L (ref 135–145)

## 2016-03-28 LAB — AST: AST: 16 U/L (ref 0–37)

## 2016-03-28 LAB — HEMOGLOBIN A1C: Hgb A1c MFr Bld: 6.2 % (ref 4.6–6.5)

## 2016-03-28 NOTE — Patient Instructions (Signed)
GO TO THE LAB : Get the blood work     GO TO THE FRONT DESK Schedule your next appointment for a  routine checkup in 6 months  

## 2016-03-28 NOTE — Assessment & Plan Note (Addendum)
Td 09; shingles shot- will wait for shingrex; pnm 23 09-2014; prevnar due 09-2015 Had a flu shot  cscope ~ 05-17-10 Dr Harrison MonsMaggod, cscope again 01-2016, had hemorrhoids, next 58 years Female care per   Dr Reece AgarG. Atkins, last OV 01-2016 dexa neg 2015  Diet, exercise-- doing great   Labs: BMP, AST, ALT, FLP, A1c, microalbumin (had coffee this morning)

## 2016-03-28 NOTE — Progress Notes (Signed)
Subjective:    Patient ID: Karen Barnes, female    DOB: 02/17/1960, 56 y.o.   MRN: [id number]  DOS:  03/28/2016 Type of visit - description : cpx Interval history: In general feeling well, no major concerns, is working on improving her diet and exercise. Has lost some weight.  Wt Readings from Last 3 Encounters:  03/28/16 183 lb 2 oz (83.1 kg)  02/03/16 186 lb (84.4 kg)  01/28/16 188 lb 4 oz (85.4 kg)     Review of Systems Constitutional: No fever. No chills. No unexplained wt changes. No unusual sweats  HEENT: No dental problems, no ear discharge, no facial swelling, no voice changes. No eye discharge, no eye  redness , no  intolerance to light   Respiratory: No wheezing , no  difficulty breathing. No cough , no mucus production  Cardiovascular: No CP, no leg swelling , no  Palpitations  GI: no nausea, no vomiting, no diarrhea , no  abdominal pain.  No blood in the stools. No dysphagia, no odynophagia    Endocrine: No polyphagia, no polyuria , no polydipsia  GU: No dysuria, gross hematuria, difficulty urinating. No urinary urgency, no frequency.  Musculoskeletal: No joint swellings or unusual aches or pains  Skin: No change in the color of the skin, palor , no  Rash  Allergic, immunologic: No environmental allergies , no  food allergies  Neurological: No dizziness no  syncope. No headaches. No diplopia, no slurred, no slurred speech, no motor deficits, no facial  Numbness  Hematological: No enlarged lymph nodes, no easy bruising , no unusual bleedings  Psychiatry: No suicidal ideas, no hallucinations, no beavior problems, no confusion.  No unusual/severe anxiety, no depression   Past Medical History:  Diagnosis Date  . Asthma, moderate persistent    sees Dr Irena Cords  . Diabetes mellitus    A1C 6.2---->  2009  . Family history of adverse reaction to anesthesia    sister had cardiac arrest with rhinoplasty  . Fibrocystic breast   . GERD (gastroesophageal  reflux disease)   . HH (hiatus hernia) 09/2014   Dr. Ewing Schlein  . History of hiatal hernia   . Hot flashes   . Hyperlipidemia   . Normal nuclear stress test 2006  . Palpitations    PACs sees cards   . Recurrent cystitis    took  postcoital antibiotic for years, symptoms better after her hysterectomy 1996, symptoms re-surface in 2011  . RUQ abdominal pain 2016   EGD showed small HH, otherwise normal  . Wears glasses     Past Surgical History:  Procedure Laterality Date  . ABDOMINAL HYSTERECTOMY  1996   due to endometriosis and fibroids approximately 1996  . BREAST LUMPECTOMY WITH RADIOACTIVE SEED LOCALIZATION Right 03/12/2014   Procedure: BREAST LUMPECTOMY WITH RADIOACTIVE SEED LOCALIZATION;  Surgeon: Harriette Bouillon, MD;  Location: [facility];  Service: General;  Laterality: Right;  . BREAST SURGERY     Lumpectomy-- BENIGN   . CATARACT EXTRACTION Bilateral 2011  . CESAREAN SECTION    . DILATION AND CURETTAGE OF UTERUS    . LIPOMA EXCISION  1978  . UPPER GASTROINTESTINAL ENDOSCOPY    . UPPER GI ENDOSCOPY  09/11/2014   Small Hiatus Hernia, Dr. Ewing Schlein    Social History   Social History  . Marital status: Married    Spouse name: N/A  . Number of children: 1  . Years of education: College   Occupational History  . retired principal  Social History Main Topics  . Smoking status: Never Smoker  . Smokeless tobacco: Never Used  . Alcohol use No  . Drug use: No  . Sexual activity: Yes   Other Topics Concern  . Not on file   Social History Narrative   Household-- pt and husband    Rare caffeine use   Daughter independent, 1 G-child     Family History  Problem Relation Age of Onset  . Mitral valve prolapse Mother   . Breast cancer Mother     M had ca insitu (age 58), aunt   . Dementia Mother   . Heart disease Father     early   . Diabetes Father   . Kidney failure Father   . Sleep apnea Father   . Brain cancer Other     2 fam members   . Lung cancer  Other     uncle, heavy smoker  . Colon cancer Neg Hx      Allergies as of 03/28/2016      Reactions   Butorphanol Tartrate Other (See Comments)   REACTION: hallucinations   Hydrocodone Other (See Comments)   HALLUCINATIONS   Roxicet [oxycodone-acetaminophen] Other (See Comments)   HALLUCINATIONS   Penicillins Hives   Zofran [ondansetron Hcl] Other (See Comments)   Hair loss      Medication List       Accurate as of 03/28/16 11:59 PM. Always use your most recent med list.          albuterol 108 (90 Base) MCG/ACT inhaler Commonly known as:  PROVENTIL HFA;VENTOLIN HFA Inhale 2 puffs into the lungs every 6 (six) hours as needed.   aspirin EC 81 MG tablet Take 81 mg by mouth daily.   bifidobacterium infantis capsule Take 1 capsule by mouth daily.   diltiazem 120 MG 24 hr capsule Commonly known as:  CARDIZEM CD Take 1 capsule (120 mg total) by mouth daily.   fexofenadine 180 MG tablet Commonly known as:  ALLEGRA Take 180 mg by mouth every morning.   fluticasone 50 MCG/ACT nasal spray Commonly known as:  FLONASE Place 2 sprays into the nose daily.   glucose blood test strip Check blood sugar no more than twice daily   hydrocortisone 2.5 % cream APPLY EXTERNALLY TO THE AFFECTED AREA TWICE DAILY AS NEEDED   metFORMIN 1000 MG tablet Commonly known as:  GLUCOPHAGE Take 1.5 tablets by mouth every morning and 1 tablet every evening   montelukast 10 MG tablet Commonly known as:  SINGULAIR Take 10 mg by mouth at bedtime.   MUCUS RELIEF ER 600 MG 12 hr tablet Generic drug:  guaiFENesin Take 1 tablet by mouth 2 (two) times daily as needed.   multivitamin with minerals tablet Take 1 tablet by mouth daily.   nitrofurantoin (macrocrystal-monohydrate) 100 MG capsule Commonly known as:  MACROBID TAKE AS DIRECTED POST COITAL   omeprazole 40 MG capsule Commonly known as:  PRILOSEC Take 1 capsule (40 mg total) by mouth 2 (two) times daily.   ONETOUCH DELICA LANCETS  33G Misc Check blood sugar no more than twice daily   PREMARIN vaginal cream Generic drug:  conjugated estrogens INSERT 1 APPLICATORFUL PER VAGINA  TWICE WEEKLY ON TUESDAYS AND FRIDAYS   PRESCRIPTION MEDICATION Allergy Shots   rosuvastatin 5 MG tablet Commonly known as:  CRESTOR Take 1 tablet (5 mg total) by mouth daily.   sitaGLIPtin 100 MG tablet Commonly known as:  JANUVIA Take 1 tablet (100 mg total) by  mouth daily.   SPIRIVA RESPIMAT 2.5 MCG/ACT Aers Generic drug:  Tiotropium Bromide Monohydrate Inhale 2 puffs into the lungs daily.   SYMBICORT 160-4.5 MCG/ACT inhaler Generic drug:  budesonide-formoterol Inhale 2 puffs into the lungs 2 (two) times daily.          Objective:   Physical Exam BP 118/76 (BP Location: Left Arm, Patient Position: Sitting, Cuff Size: Small)   Pulse 71   Temp 98 F (36.7 C) (Oral)   Resp 12   Ht 5\' 4"  (1.626 m)   Wt 183 lb 2 oz (83.1 kg)   SpO2 98%   BMI 31.43 kg/m   General:   Well developed, well nourished . NAD.  Neck: No  thyromegaly  HEENT:  Normocephalic . Face symmetric, atraumatic Lungs:  CTA B Normal respiratory effort, no intercostal retractions, no accessory muscle use. Heart: RRR,  no murmur.  No pretibial edema bilaterally  Abdomen:  Not distended, soft, non-tender. No rebound or rigidity.   Skin: Exposed areas without rash. Not pale. Not jaundice Neurologic:  alert & oriented X3.  Speech normal, gait appropriate for age and unassisted Strength symmetric and appropriate for age.  Psych: Cognition and judgment appear intact.  Cooperative with normal attention span and concentration.  Behavior appropriate. No anxious or depressed appearing.    Assessment & Plan:   Assessment DM  aic 6.2 (2009) Hyperlipidemia GERD, HH, Dr Ewing Schlein  Asthma -- dr Madie Reno, on allergy shots Palpitations, PACs, sees cardiology, (-) stress test 2006 OSA (mild)  per sleep study 02-2016, Rx weight loss, dental appliance Recurrent  cystitis, on poscoital abx, sx better after hysterectomy 1996, resurfaced 2011 Fibrocystic breast dz Lung nodule: Subtle pulmonary 10-2015, next CT 10-2016  PLAN: DM: Doing great with lifestyle, continue Januvia, metformin, check labs. Hyperlipidemia: On Crestor, checking labs Mild OSA: Working on weight loss, plan to get a dental appliance. 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 RTC 6 months

## 2016-03-28 NOTE — Progress Notes (Signed)
Pre visit review using our clinic review tool, if applicable. No additional management support is needed unless otherwise documented below in the visit note. 

## 2016-03-29 NOTE — Assessment & Plan Note (Signed)
DM: Doing great with lifestyle, continue Januvia, metformin, check labs. Hyperlipidemia: On Crestor, checking labs Mild OSA: Working on weight loss, plan to get a dental appliance. 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 RTC 6 months

## 2016-06-20 ENCOUNTER — Other Ambulatory Visit: Payer: Self-pay | Admitting: Internal Medicine

## 2016-06-28 ENCOUNTER — Other Ambulatory Visit: Payer: Self-pay | Admitting: Internal Medicine

## 2016-07-11 ENCOUNTER — Encounter (HOSPITAL_COMMUNITY): Payer: Self-pay | Admitting: Emergency Medicine

## 2016-07-11 ENCOUNTER — Emergency Department (HOSPITAL_COMMUNITY)
Admission: EM | Admit: 2016-07-11 | Discharge: 2016-07-11 | Disposition: A | Discharge status: Home / Self Care | Payer: BC Managed Care – PPO | Attending: Emergency Medicine | Admitting: Emergency Medicine

## 2016-07-11 ENCOUNTER — Emergency Department (HOSPITAL_COMMUNITY): Payer: BC Managed Care – PPO

## 2016-07-11 DIAGNOSIS — R55 Syncope and collapse: Secondary | ICD-10-CM | POA: Diagnosis present

## 2016-07-11 DIAGNOSIS — Z79899 Other long term (current) drug therapy: Secondary | ICD-10-CM | POA: Diagnosis not present

## 2016-07-11 DIAGNOSIS — Z7984 Long term (current) use of oral hypoglycemic drugs: Secondary | ICD-10-CM | POA: Diagnosis not present

## 2016-07-11 DIAGNOSIS — Z7982 Long term (current) use of aspirin: Secondary | ICD-10-CM | POA: Insufficient documentation

## 2016-07-11 DIAGNOSIS — E119 Type 2 diabetes mellitus without complications: Secondary | ICD-10-CM | POA: Diagnosis not present

## 2016-07-11 DIAGNOSIS — J45909 Unspecified asthma, uncomplicated: Secondary | ICD-10-CM | POA: Insufficient documentation

## 2016-07-11 LAB — CBC WITH DIFFERENTIAL/PLATELET
Basophils Absolute: 0 10*3/uL (ref 0.0–0.1)
Basophils Relative: 0 %
Eosinophils Absolute: 0.1 10*3/uL (ref 0.0–0.7)
Eosinophils Relative: 2 %
HCT: 37.6 % (ref 36.0–46.0)
Hemoglobin: 11.8 g/dL — ABNORMAL LOW (ref 12.0–15.0)
LYMPHS PCT: 17 %
Lymphs Abs: 1.1 10*3/uL (ref 0.7–4.0)
MCH: 26 pg (ref 26.0–34.0)
MCHC: 31.4 g/dL (ref 30.0–36.0)
MCV: 83 fL (ref 78.0–100.0)
MONO ABS: 0.5 10*3/uL (ref 0.1–1.0)
MONOS PCT: 7 %
NEUTROS ABS: 5 10*3/uL (ref 1.7–7.7)
Neutrophils Relative %: 74 %
Platelets: 262 10*3/uL (ref 150–400)
RBC: 4.53 MIL/uL (ref 3.87–5.11)
RDW: 14.5 % (ref 11.5–15.5)
WBC: 6.7 10*3/uL (ref 4.0–10.5)

## 2016-07-11 LAB — BASIC METABOLIC PANEL
Anion gap: 10 (ref 5–15)
BUN: 10 mg/dL (ref 6–20)
CALCIUM: 9.4 mg/dL (ref 8.9–10.3)
CO2: 29 mmol/L (ref 22–32)
Chloride: 102 mmol/L (ref 101–111)
Creatinine, Ser: 0.73 mg/dL (ref 0.44–1.00)
GFR calc Af Amer: >60 mL/min (ref >60)
GFR calc non Af Amer: >60 mL/min (ref >60)
GLUCOSE: 167 mg/dL — AB (ref 65–99)
POTASSIUM: 3.2 mmol/L — AB (ref 3.5–5.1)
Sodium: 141 mmol/L (ref 135–145)

## 2016-07-11 LAB — CBG MONITORING, ED: GLUCOSE-CAPILLARY: 178 mg/dL — AB (ref 65–99)

## 2016-07-11 LAB — I-STAT TROPONIN, ED: TROPONIN I, POC: 0 ng/mL (ref 0.00–0.08)

## 2016-07-11 MED ORDER — POTASSIUM CHLORIDE CRYS ER 20 MEQ PO TBCR
40.0000 meq | EXTENDED_RELEASE_TABLET | Freq: Once | ORAL | Status: AC
  Administered 2016-07-11: 40 meq via ORAL
  Filled 2016-07-11: qty 2

## 2016-07-11 MED ORDER — SODIUM CHLORIDE 0.9 % IV BOLUS (SEPSIS)
1000.0000 mL | Freq: Once | INTRAVENOUS | Status: AC
  Administered 2016-07-11: 1000 mL via INTRAVENOUS

## 2016-07-11 NOTE — ED Triage Notes (Signed)
PT was a visitor all night in a room on Pod E. PT had a sausage biscuit at 9am. At 11am, PT felt as though she was going to pass out. PT got cold, clammy, and sweaty. PT reports she felt weak and dizzy. PT reports she thought she was going to pass out. PT reports it feels similar to a low blood glucose episode.

## 2016-07-11 NOTE — Discharge Instructions (Signed)
As discussed, make sure that he stay well-hydrated and keep your urine clear. Tried to get some rest and stay well-nourished.  Use provided resources to get assistance with caring for your parents. Follow-up with your primary care provider and cardiologist as needed. Returns to the emergency department if you experience any chest pressure, shortness of breath, dizziness, nausea vomiting or any other new concerning symptoms in the meantime

## 2016-07-11 NOTE — ED Notes (Signed)
Patient transported to X-ray 

## 2016-07-11 NOTE — ED Provider Notes (Signed)
[facility]-EMERGENCY DEPT Provider Note   CSN: [id number] Arrival date & time: 07/11/16  1114     History   Chief Complaint Chief Complaint  Patient presents with  . Near Syncope    HPI Karen Barnes is a 56 y.o. female presenting with near syncope. 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 She has been under a lot of stress over the past 24 hours with her father falling and staying up all night with him in the emergency department. She was sleep deprived, blood pressure was low and she hasn't been drinking very much fluids for the last day. She reports feeling better now that she is lying down. She currently states that she feels really run down and tired. She denies chest pain, shortness of breath, nausea, vomiting, dizziness, or other symptoms at this time.  HPI  Past Medical History:  Diagnosis Date  . Asthma, moderate persistent    sees Dr Irena Cords  . Diabetes mellitus    A1C 6.2---->  2009  . Family history of adverse reaction to anesthesia    sister had cardiac arrest with rhinoplasty  . Fibrocystic breast   . GERD (gastroesophageal reflux disease)   . HH (hiatus hernia) 09/2014   Dr. Ewing Schlein  . History of hiatal hernia   . Hot flashes   . Hyperlipidemia   . Normal nuclear stress test 2006  . Palpitations    PACs sees cards   . Recurrent cystitis    took  postcoital antibiotic for years, symptoms better after her hysterectomy 1996, symptoms re-surface in 2011  . RUQ abdominal pain 2016   EGD showed small HH, otherwise normal  . Wears glasses     Patient Active Problem List   Diagnosis Date Noted  . Edema of both ankles 02/03/2016  . 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 OSA (obstructive sleep apnea)  02/03/2016  . Solitary pulmonary nodule 11/03/2015  . Cough 11/03/2015  . PCP NOTES >>>>>>>>>>>>>>>>>>>>>>>>>>>>>>>>>>> 03/26/2015  . Annual physical exam 01/10/2013  . Family history of breast cancer 02/23/2012  . At risk for coronary artery disease 02/07/2012  . Obesity 12/13/2010  . Heart palpitations 12/13/2010  . Elevated blood pressure reading 12/13/2010  . Hyperlipidemia 03/07/2007  . [facility] 03/07/2007  . CYSTITIS, RECURRENT 03/07/2007  . DMII (diabetes mellitus, type 2) (HCC) 03/07/2007  . Asthma 07/12/2006  . GERD 07/12/2006    Past Surgical History:  Procedure Laterality Date  . ABDOMINAL HYSTERECTOMY  1996   due to endometriosis and fibroids approximately 1996  . BREAST LUMPECTOMY WITH RADIOACTIVE SEED LOCALIZATION Right 03/12/2014   Procedure: BREAST LUMPECTOMY WITH RADIOACTIVE SEED LOCALIZATION;  Surgeon: Harriette Bouillon, MD;  Location: [facility];  Service: General;  Laterality: Right;  . BREAST SURGERY     Lumpectomy-- BENIGN   . CATARACT EXTRACTION Bilateral 2011  . CESAREAN SECTION    . DILATION AND CURETTAGE OF UTERUS    . LIPOMA EXCISION  1978  . UPPER GASTROINTESTINAL ENDOSCOPY    . UPPER GI ENDOSCOPY  09/11/2014   Small Hiatus Hernia, Dr. Ewing Schlein    OB History    No data available  Home Medications    Prior to Admission medications   Medication Sig Start Date End Date Taking? Authorizing Provider  albuterol (PROVENTIL HFA;VENTOLIN HFA) 108 (90 BASE) MCG/ACT inhaler Inhale 2 puffs into the lungs every 6 (six) hours as needed.    [provider]  aspirin EC 81 MG tablet Take 81 mg by mouth daily.    [provider]  bifidobacterium infantis (ALIGN) capsule Take 1 capsule by mouth daily.    [provider]  budesonide-formoterol (SYMBICORT) 160-4.5 MCG/ACT inhaler Inhale 2 puffs into the lungs 2 (two) times daily. 09/14/15   [provider]  diltiazem (CARDIZEM CD) 120 MG 24 hr capsule Take 1 capsule (120  mg total) by mouth daily. 10/12/15   Dyann Kief, PA-C  fexofenadine (ALLEGRA) 180 MG tablet Take 180 mg by mouth every morning.     [provider]  fluticasone (FLONASE) 50 MCG/ACT nasal spray Place 2 sprays into the nose daily.      [provider]  glucose blood test strip Check blood sugar no more than twice daily Patient not taking: Reported on 03/28/2016 01/10/16   Wanda Plump, MD  hydrocortisone 2.5 % cream APPLY EXTERNALLY TO THE AFFECTED AREA TWICE DAILY AS NEEDED 01/28/16   Wanda Plump, MD  metFORMIN (GLUCOPHAGE) 1000 MG tablet TAKE 1&1/2 TABLETS BY MOUTH EVERY MORNING AND 1 TABLET EVERY EVENING 06/20/16   Wanda Plump, MD  montelukast (SINGULAIR) 10 MG tablet Take 10 mg by mouth at bedtime.      [provider]  MUCUS RELIEF ER 600 MG 12 hr tablet Take 1 tablet by mouth 2 (two) times daily as needed. 12/18/15   [provider]  Multiple Vitamins-Minerals (MULTIVITAMIN WITH MINERALS) tablet Take 1 tablet by mouth daily.    [provider]  nitrofurantoin, macrocrystal-monohydrate, (MACROBID) 100 MG capsule TAKE AS DIRECTED POST COITAL Patient not taking: Reported on 03/28/2016 01/28/16   Wanda Plump, MD  omeprazole (PRILOSEC) 40 MG capsule Take 1 capsule (40 mg total) by mouth 2 (two) times daily. Patient taking differently: Take 40 mg by mouth daily.  06/24/15   Wanda Plump, MD  [facility] DELICA LANCETS 33G MISC Check blood sugar no more than twice daily Patient not taking: Reported on 03/28/2016 12/27/15   Wanda Plump, MD  PREMARIN vaginal cream INSERT 1 APPLICATORFUL PER VAGINA  TWICE WEEKLY ON TUESDAYS AND FRIDAYS 07/10/14   [provider]  PRESCRIPTION MEDICATION Allergy Shots    [provider]  rosuvastatin (CRESTOR) 5 MG tablet Take 1 tablet (5 mg total) by mouth daily. 10/12/15   Dyann Kief, PA-C  sitaGLIPtin (JANUVIA) 100 MG tablet Take 1 tablet (100 mg total) by mouth daily. 06/28/16   Wanda Plump, MD  Tiotropium  Bromide Monohydrate (SPIRIVA RESPIMAT) 2.5 MCG/ACT AERS Inhale 2 puffs into the lungs daily.    [provider]    Family History Family History  Problem Relation Age of Onset  . Mitral valve prolapse Mother   . Breast cancer Mother        M had ca insitu (age 36), aunt   . Dementia Mother   . Heart disease Father        early   . Diabetes Father   . Kidney failure Father   . Sleep apnea Father   . Brain cancer Other        2 fam members   . Lung cancer Other  uncle, heavy smoker  . Colon cancer Neg Hx     Social History Social History  Substance Use Topics  . Smoking status: Never Smoker  . Smokeless tobacco: Never Used  . Alcohol use No     Allergies   Butorphanol tartrate; Hydrocodone; Roxicet [oxycodone-acetaminophen]; Penicillins; and Zofran [ondansetron hcl]   Review of Systems Review of Systems  Constitutional: Negative for chills and fever.  Eyes: Negative for visual disturbance.  Respiratory: Negative for cough, shortness of breath and stridor.   Cardiovascular: Negative for chest pain, palpitations and leg swelling.  Gastrointestinal: Negative for abdominal distention, abdominal pain, nausea and vomiting.  Genitourinary: Negative for dysuria and hematuria.  Musculoskeletal: Negative for arthralgias, back pain, gait problem, joint swelling, myalgias, neck pain and neck stiffness.  Skin: Negative for color change, pallor and rash.  Neurological: Positive for light-headedness. Negative for dizziness, tremors, seizures, syncope, facial asymmetry, speech difficulty, weakness, numbness and headaches.     Physical Exam Updated Vital Signs BP 130/68   Pulse 77   Temp 97.8 F (36.6 C) (Oral)   Resp 16   Ht 5\' 4"  (1.626 m)   Wt 81.2 kg (179 lb)   SpO2 99%   BMI 30.73 kg/m   Physical Exam  Constitutional: She is oriented to person, place, and time. She appears well-developed and well-nourished. No distress.  HENT:  Head: Normocephalic and  atraumatic.  Eyes: Conjunctivae are normal.  Neck: Normal range of motion. Neck supple.  Cardiovascular: Normal rate, regular rhythm, normal heart sounds and intact distal pulses.   No murmur heard. Pulmonary/Chest: Effort normal and breath sounds normal. No respiratory distress. She has no wheezes. She has no rales.  Abdominal: Soft. She exhibits no distension. There is no tenderness. There is no guarding.  Musculoskeletal: Normal range of motion. She exhibits no edema, tenderness or deformity.  Neurological: She is alert and oriented to person, place, and time. No cranial nerve deficit.  Neurologic Exam:  - Mental status: Patient is alert and cooperative. Fluent speech and words are clear. Coherent thought processes and insight is good. Patient is oriented x 4 to person, place, time and event.  - Cranial nerves:  CN III, IV, VI: pupils equally round, reactive to light both direct and conscensual and normal accommodation. Full extra-ocular movement. CN V: motor temporalis and masseter strength intact. CN VII : muscles of facial expression intact. CN X :  midline uvula. XI strength of sternocleidomastoid and trapezius muscles 5/5, XII: tongue is midline when protruded. - Motor: No involuntary movements. Muscle tone and bulk normal throughout. Muscle strength is 5/5 in bilateral shoulder abduction, elbow flexion and extension, grip, hip extension, flexion, leg flexion and extension, ankle dorsiflexion and plantar flexion.  - Sensory: Proprioception, light tough sensation intact in all extremities.  - Cerebellar: rapid alternating movements and point to point movement intact in upper and lower extremities. Normal stance and gait.  Skin: Skin is warm and dry. No rash noted. She is not diaphoretic. No erythema. No pallor.  Psychiatric: She has a normal mood and affect.  Nursing note and vitals reviewed.    ED Treatments / Results  Labs (all labs ordered are listed, but only abnormal results are  displayed) Labs Reviewed  BASIC METABOLIC PANEL - Abnormal; Notable for the following:       Result Value   Potassium 3.2 (*)    Glucose, Bld 167 (*)    All other components within normal limits  CBC WITH DIFFERENTIAL/PLATELET - Abnormal; Notable for  the following:    Hemoglobin 11.8 (*)    All other components within normal limits  CBG MONITORING, ED - Abnormal; Notable for the following:    Glucose-Capillary 178 (*)    All other components within normal limits  I-STAT TROPOININ, ED   Hemoglobin  Date Value Ref Range Status  07/11/2016 11.8 (L) 12.0 - 15.0 g/dL Final  16/10/960412/22/2017 54.012.3 11.7 - 15.5 g/dL Final  98/11/914709/27/2017 82.913.5 12.0 - 15.0 g/dL Final  56/21/308607/11/2014 57.812.8 12.0 - 15.0 g/dL Final     EKG  EKG Interpretation None       Radiology Dg Chest 2 View  Result Date: 07/11/2016 CLINICAL DATA:  56 year old with a near syncopal episode earlier today, hypotensive upon arrival to the emergency department. Current history of diabetes and asthma. EXAM: CHEST  2 VIEW COMPARISON:  CT chest 10/18/2015. Chest x-rays 10/13/2015, 08/16/2014 and earlier. FINDINGS: Cardiomediastinal silhouette unremarkable, unchanged. Lungs clear. Bronchovascular markings normal. Pulmonary vascularity normal. No visible pleural effusions. No pneumothorax. Visualized bony thorax intact. No interval change. IMPRESSION: No acute cardiopulmonary disease.  Stable examination. Electronically Signed   By: Hulan Saashomas  Lawrence M.D.   On: 07/11/2016 13:01    Procedures Procedures (including critical care time)  Medications Ordered in ED Medications  potassium chloride SA (K-DUR,KLOR-CON) CR tablet 40 mEq (not administered)  sodium chloride 0.9 % bolus 1,000 mL (1,000 mLs Intravenous New Bag/Given 07/11/16 1241)     Initial Impression / Assessment and Plan / ED Course  I have reviewed the triage vital signs and the nursing notes.  Pertinent labs & imaging results that were available during my care of the patient were  reviewed by me and considered in my medical decision making (see chart for details).    Patient is a family member and caregiver of the patient in the ED. She has been staying up all night with her father in the emergency department discharge early this morning hasn't been  Drinking fluids. She had to come back this morning with her father has been under a lot of stress.  She had a presyncopal episode while attempting to speak to his nurse at the nursing station. She denies shortness of breath, chest pain, dizziness, nausea/vomiting on assessment.  Patient assessed after being roomed and she reports feeling much better and resting. Afebrile nontoxic and well-appearing. Patient sees a cardiologist for PACs but has no known CAD. Heart score : 3  Reassuring exam, normal neuro, Patient walked in the hall without difficulties. Normal stance and gait. Orthostatic vitals signs: negative EKG NSR, negative troponin, slight hypokalemia, labs otherwise unremarkable. patient given potassium supplement.  We'll discharge home with close follow-up with PCP and her cardiologist as needed.  Discussed strict return precautions and advised to return to the emergency department if experiencing any new or worsening symptoms. Instructions were understood and patient agreed with discharge plan.  Final Clinical Impressions(s) / ED Diagnoses   Final diagnoses:  Near syncope    New Prescriptions New Prescriptions   No medications on file     Gregary CromerMitchell, Kelis Plasse B, PA-C 07/11/16 1404    Margarita Grizzleay, Danielle, MD 07/17/16 [phone]

## 2016-07-11 NOTE — ED Notes (Signed)
Pt's CBG result was 178. Informed Charlotte - RN.

## 2016-08-16 ENCOUNTER — Telehealth: Payer: Self-pay | Admitting: Internal Medicine

## 2016-08-16 NOTE — Telephone Encounter (Signed)
Recommend she call and discuss with insurance to make sure insurance covers, monitor is very expensive w/o insurance coverage.

## 2016-08-16 NOTE — Telephone Encounter (Signed)
Pt called in to see if provider is able to give a Rx for a Free [employer] monitor. She says that her father is also a diabetic and he have one. She think that it would be helpful to have.    Please assist further.

## 2016-08-17 NOTE — Telephone Encounter (Signed)
Called pt to make her aware. She said that she will call insurance and call us back

## 2016-08-21 ENCOUNTER — Encounter: Payer: Self-pay | Admitting: Internal Medicine

## 2016-09-25 ENCOUNTER — Ambulatory Visit: Payer: BC Managed Care – PPO | Admitting: Internal Medicine

## 2016-09-26 ENCOUNTER — Ambulatory Visit (INDEPENDENT_AMBULATORY_CARE_PROVIDER_SITE_OTHER): Payer: BC Managed Care – PPO | Admitting: Internal Medicine

## 2016-09-26 ENCOUNTER — Encounter: Payer: Self-pay | Admitting: Internal Medicine

## 2016-09-26 VITALS — BP 124/78 | HR 78 | Temp 98.0°F | Resp 14 | Ht 64.0 in | Wt 182.0 lb

## 2016-09-26 DIAGNOSIS — E119 Type 2 diabetes mellitus without complications: Secondary | ICD-10-CM | POA: Diagnosis not present

## 2016-09-26 DIAGNOSIS — R55 Syncope and collapse: Secondary | ICD-10-CM | POA: Diagnosis not present

## 2016-09-26 DIAGNOSIS — M25512 Pain in left shoulder: Secondary | ICD-10-CM | POA: Diagnosis not present

## 2016-09-26 LAB — CBC WITH DIFFERENTIAL/PLATELET
Basophils Absolute: 0 10*3/uL (ref 0.0–0.1)
Basophils Relative: 0.6 % (ref 0.0–3.0)
EOS ABS: 0.1 10*3/uL (ref 0.0–0.7)
EOS PCT: 2.4 % (ref 0.0–5.0)
HEMATOCRIT: 37 % (ref 36.0–46.0)
HEMOGLOBIN: 12.1 g/dL (ref 12.0–15.0)
LYMPHS PCT: 24.8 % (ref 12.0–46.0)
Lymphs Abs: 1.3 10*3/uL (ref 0.7–4.0)
MCHC: 32.8 g/dL (ref 30.0–36.0)
MCV: 84.1 fl (ref 78.0–100.0)
MONO ABS: 0.4 10*3/uL (ref 0.1–1.0)
Monocytes Relative: 8.4 % (ref 3.0–12.0)
Neutro Abs: 3.4 10*3/uL (ref 1.4–7.7)
Neutrophils Relative %: 63.8 % (ref 43.0–77.0)
Platelets: 272 10*3/uL (ref 150.0–400.0)
RBC: 4.4 Mil/uL (ref 3.87–5.11)
RDW: 15 % (ref 11.5–15.5)
WBC: 5.3 10*3/uL (ref 4.0–10.5)

## 2016-09-26 LAB — BASIC METABOLIC PANEL
BUN: 14 mg/dL (ref 6–23)
CO2: 31 mEq/L (ref 19–32)
Calcium: 9.6 mg/dL (ref 8.4–10.5)
Chloride: 102 mEq/L (ref 96–112)
Creatinine, Ser: 0.7 mg/dL (ref 0.40–1.20)
GFR: 91.35 mL/min (ref >60.00)
Glucose, Bld: 132 mg/dL — ABNORMAL HIGH (ref 70–99)
POTASSIUM: 4.6 meq/L (ref 3.5–5.1)
Sodium: 140 mEq/L (ref 135–145)

## 2016-09-26 LAB — HEMOGLOBIN A1C: HEMOGLOBIN A1C: 6.2 % (ref 4.6–6.5)

## 2016-09-26 MED ORDER — FREESTYLE LIBRE SENSOR SYSTEM MISC: 0 refills | Status: DC

## 2016-09-26 NOTE — Progress Notes (Signed)
Subjective:    Patient ID: Karen Barnes, female    DOB: 04-02-1960, 56 y.o.   MRN: [id number]  DOS:  09/26/2016 Type of visit - description :  Interval history: -Went  to the ER 07/11/2016, DX was near syncope; she though was having hypoglycemia, took glucose pills PTA to the ER; all sx happening in the context of a lot of stress, sleep deprivation due to her father been sick @ the ER CBG 178, potassium 3.2, hemoglobin 11.8. Was given potassium supplements IV fluids and discharged home  -DM: Good compliance of medication, CBG this morning 146. 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 -Shoulder pain: On and off left shoulder pain for the last 6 weeks after she lift his father from a examining table. Pain is sharp, last a minute or 2 and usually triggered by certain movements. Worse when she tries to reach behind.  Review of Systems Denies low sugar symptoms Occasionally feels emotional but no depression per se.  Past Medical History:  Diagnosis Date  . Asthma, moderate persistent    sees Dr Irena Cords  . Diabetes mellitus    A1C 6.2---->  2009  . Family history of adverse reaction to anesthesia    sister had cardiac arrest with rhinoplasty  . Fibrocystic breast   . GERD (gastroesophageal reflux disease)   . HH (hiatus hernia) 09/2014   Dr. Ewing Schlein  . History of hiatal hernia   . Hot flashes   . Hyperlipidemia   . Normal nuclear stress test 2006  . Palpitations    PACs sees cards   . Recurrent cystitis    took  postcoital antibiotic for years, symptoms better after her hysterectomy 1996, symptoms re-surface in 2011  . RUQ abdominal pain 2016   EGD showed small HH, otherwise normal  . Wears glasses     Past Surgical History:  Procedure Laterality Date  . ABDOMINAL HYSTERECTOMY  1996   due to endometriosis and fibroids approximately 1996  . BREAST LUMPECTOMY WITH RADIOACTIVE SEED LOCALIZATION Right 03/12/2014   Procedure: BREAST  LUMPECTOMY WITH RADIOACTIVE SEED LOCALIZATION;  Surgeon: Harriette Bouillon, MD;  Location: [facility];  Service: General;  Laterality: Right;  . BREAST SURGERY     Lumpectomy-- BENIGN   . CATARACT EXTRACTION Bilateral 2011  . CESAREAN SECTION    . DILATION AND CURETTAGE OF UTERUS    . LIPOMA EXCISION  1978  . UPPER GASTROINTESTINAL ENDOSCOPY    . UPPER GI ENDOSCOPY  09/11/2014   Small Hiatus Hernia, Dr. Ewing Schlein    Social History   Social History  . Marital status: Married    Spouse name: N/A  . Number of children: 1  . Years of education: College   Occupational History  . retired principal    Social History Main Topics  . Smoking status: Never Smoker  . Smokeless tobacco: Never Used  . Alcohol use No  . Drug use: No  . Sexual activity: Yes   Other Topics Concern  . Not on file   Social History Narrative   Household-- pt and husband    Rare caffeine use   Daughter independent, 1 G-child   Engineer, structural for F and M, both w/ dementia      Allergies as of 09/26/2016      Reactions   Butorphanol Tartrate Other (See Comments)   REACTION: hallucinations   Hydrocodone Other (See Comments)   HALLUCINATIONS   Roxicet [oxycodone-acetaminophen] Other (See Comments)  HALLUCINATIONS   Penicillins Hives   Zofran [ondansetron Hcl] Other (See Comments)   Hair loss      Medication List       Accurate as of 09/26/16  7:45 PM. Always use your most recent med list.          albuterol 108 (90 Base) MCG/ACT inhaler Commonly known as:  PROVENTIL HFA;VENTOLIN HFA Inhale 2 puffs into the lungs every 6 (six) hours as needed.   aspirin EC 81 MG tablet Take 81 mg by mouth daily.   bifidobacterium infantis capsule Take 1 capsule by mouth daily.   diltiazem 120 MG 24 hr capsule Commonly known as:  CARDIZEM CD Take 1 capsule (120 mg total) by mouth daily.   fexofenadine 180 MG tablet Commonly known as:  ALLEGRA Take 180 mg by mouth every morning.   fluticasone 50  MCG/ACT nasal spray Commonly known as:  FLONASE Place 2 sprays into the nose daily.   FREESTYLE LIBRE SENSOR SYSTEM Misc Check blood sugar daily as directed   hydrocortisone 2.5 % cream APPLY EXTERNALLY TO THE AFFECTED AREA TWICE DAILY AS NEEDED   metFORMIN 1000 MG tablet Commonly known as:  GLUCOPHAGE TAKE 1&1/2 TABLETS BY MOUTH EVERY MORNING AND 1 TABLET EVERY EVENING   montelukast 10 MG tablet Commonly known as:  SINGULAIR Take 10 mg by mouth at bedtime.   MUCUS RELIEF ER 600 MG 12 hr tablet Generic drug:  guaiFENesin Take 1 tablet by mouth 2 (two) times daily as needed.   multivitamin with minerals tablet Take 1 tablet by mouth daily.   nitrofurantoin (macrocrystal-monohydrate) 100 MG capsule Commonly known as:  MACROBID TAKE AS DIRECTED POST COITAL   omeprazole 40 MG capsule Commonly known as:  PRILOSEC Take 1 capsule (40 mg total) by mouth 2 (two) times daily.   PREMARIN vaginal cream Generic drug:  conjugated estrogens INSERT 1 APPLICATORFUL PER VAGINA  TWICE WEEKLY ON TUESDAYS AND FRIDAYS   PRESCRIPTION MEDICATION Allergy Shots   rosuvastatin 5 MG tablet Commonly known as:  CRESTOR Take 1 tablet (5 mg total) by mouth daily.   sitaGLIPtin 100 MG tablet Commonly known as:  JANUVIA Take 1 tablet (100 mg total) by mouth daily.   SPIRIVA RESPIMAT 2.5 MCG/ACT Aers Generic drug:  Tiotropium Bromide Monohydrate Inhale 2 puffs into the lungs daily.   SYMBICORT 160-4.5 MCG/ACT inhaler Generic drug:  budesonide-formoterol Inhale 2 puffs into the lungs 2 (two) times daily.          Objective:   Physical Exam BP 124/78 (BP Location: Left Arm, Patient Position: Sitting, Cuff Size: Normal)   Pulse 78   Temp 98 F (36.7 C) (Oral)   Resp 14   Ht 5\' 4"  (1.626 m)   Wt 182 lb (82.6 kg)   SpO2 98%   BMI 31.24 kg/m  General:   Well developed, well nourished . NAD.  HEENT:  Normocephalic . Face symmetric, atraumatic Lungs:  CTA B Normal respiratory  effort, no intercostal retractions, no accessory muscle use. Heart: RRR,  no murmur.  No pretibial edema bilaterally  Skin: Not pale. Not jaundice MSK:  Right shoulder normal Left shoulder: Range of motion is slightly limited particularly when she reaches back. Overhead movements okay. UE strength symmetric. DIABETIC FEET EXAM: No lower extremity edema Normal pedal pulses bilaterally Skin normal, nails normal, no calluses Pinprick examination of the feet normal. Neurologic:  alert & oriented X3.  Speech normal, gait appropriate for age and unassisted Psych--  Cognition and judgment appear  intact.  Cooperative with normal attention span and concentration.  Behavior appropriate. No anxious or depressed appearing.      Assessment & Plan:   Assessment DM  aic 6.2 (2009) Hyperlipidemia GERD, HH, Dr Ewing Schlein  Asthma -- dr Madie Reno, on allergy shots Palpitations, PACs, sees cardiology, (-) stress test 2006 OSA (mild)  per sleep study 02-2016, Rx weight loss, dental appliance Recurrent cystitis, on poscoital abx, sx better after hysterectomy 1996, resurfaced 2011 Fibrocystic breast dz Lung nodule: Subtle pulmonary 10-2015, next CT 10-2016  PLAN: DM: wnl feet exam. Continue with Januvia and metformin. Check a BMP and A1c Near-syncope: In the context of sleep deprivation, sxs resolved. Hemoglobin was a slightly low, recheck today. Left shoulder pain: Rec self PT, she did PT before for the other shoulder and is knowledgeable on how to do it. Okay to use a sporadic Tylenol and ibuprofen. Call if not better for a referral to sports medicine. Stress: Related to taking care of her parents, she is handling the situation really well praised, counseled RTC 03-2017

## 2016-09-26 NOTE — Patient Instructions (Signed)
GO TO THE LAB : Get the blood work     GO TO THE FRONT DESK Schedule your next appointment for a  physical exam by 03-2017. Fasting  Call if your shoulder pain is not gradually improving

## 2016-09-26 NOTE — Progress Notes (Signed)
Pre visit review using our clinic review tool, if applicable. No additional management support is needed unless otherwise documented below in the visit note. 

## 2016-09-27 NOTE — Assessment & Plan Note (Signed)
DM: wnl feet exam. Continue with Januvia and metformin. Check a BMP and A1c Near-syncope: In the context of sleep deprivation, sxs resolved. Hemoglobin was a slightly low, recheck today. Left shoulder pain: Rec self PT, she did PT before for the other shoulder and is knowledgeable on how to do it. Okay to use a sporadic Tylenol and ibuprofen. Call if not better for a referral to sports medicine. Stress: Related to taking care of her parents, she is handling the situation really well praised, counseled RTC 03-2017

## 2016-09-29 ENCOUNTER — Other Ambulatory Visit: Payer: Self-pay | Admitting: Internal Medicine

## 2016-10-14 ENCOUNTER — Other Ambulatory Visit: Payer: Self-pay | Admitting: Physician Assistant

## 2016-10-14 ENCOUNTER — Other Ambulatory Visit: Payer: Self-pay | Admitting: Internal Medicine

## 2016-10-18 ENCOUNTER — Ambulatory Visit: Payer: BC Managed Care – PPO | Admitting: Cardiovascular Disease

## 2016-11-01 ENCOUNTER — Encounter: Payer: Self-pay | Admitting: Internal Medicine

## 2016-11-02 ENCOUNTER — Telehealth: Payer: Self-pay

## 2016-11-02 ENCOUNTER — Other Ambulatory Visit: Payer: Self-pay | Admitting: Internal Medicine

## 2016-11-02 MED ORDER — DICLOFENAC SODIUM 1 % TD GEL: 2.0000 g | Freq: Four times a day (QID) | TRANSDERMAL | 5 refills | Status: DC

## 2016-11-02 NOTE — Telephone Encounter (Signed)
PA initiated via Covermymeds; KEY: [id number]. Received real-time PA approval 11/02/2016 through 11/03/2019.

## 2016-12-07 ENCOUNTER — Other Ambulatory Visit: Payer: Self-pay | Admitting: Internal Medicine

## 2016-12-21 ENCOUNTER — Other Ambulatory Visit: Payer: Self-pay | Admitting: Physician Assistant

## 2016-12-22 ENCOUNTER — Ambulatory Visit: Payer: BC Managed Care – PPO | Admitting: Cardiology

## 2016-12-22 ENCOUNTER — Encounter: Payer: Self-pay | Admitting: Cardiology

## 2016-12-22 VITALS — BP 122/62 | HR 81 | Ht 64.0 in | Wt 181.0 lb

## 2016-12-22 DIAGNOSIS — R002 Palpitations: Secondary | ICD-10-CM | POA: Diagnosis not present

## 2016-12-22 DIAGNOSIS — I10 Essential (primary) hypertension: Secondary | ICD-10-CM | POA: Diagnosis not present

## 2016-12-22 DIAGNOSIS — E785 Hyperlipidemia, unspecified: Secondary | ICD-10-CM

## 2016-12-22 MED ORDER — DILTIAZEM HCL ER COATED BEADS 120 MG PO CP24: 120.0000 mg | ORAL_CAPSULE | Freq: Every day | ORAL | 3 refills | Status: DC

## 2016-12-22 MED ORDER — ROSUVASTATIN CALCIUM 5 MG PO TABS: ORAL_TABLET | ORAL | 3 refills | Status: DC

## 2016-12-22 NOTE — Progress Notes (Signed)
Cardiology Office Note    Date:  12/22/2016   ID:  Karen CottonBeverly B Peppers, DOB 07/15/1960, MRN [id number]  PCP:  Wanda PlumpPaz, Jose E, MD  Cardiologist: Dr. Shirlee LatchMcLean--> Dr Delton SeeNelson  Chief complain: 1 year follow up  History of Present Illness:  Karen Barnes is a 56 y.o. female   a history of DM 2, hypertension, and symptomatic palpitations controlled with atenolol.. She has a strong family history of premature CAD. She had a normal stress test in 2006. Her father had an MI at 2947 and later CABG. Her mother had atrial fibrillation. 2 grandparents with presumed SCD. Uncles with MIs in their 5850s. Lipid profile in 04/07/14 cholesterol 150 triglycerides 86 HDL 59 and LDL 74.  10/12/15 - Here for yearly follow up.Having a terrible time with asthma and bronchitis. Has been on steroids and antibiotics since July. Allergist requested her to change from beta blocker to cardizem and she has done well with this. Occasional palpations with the multiple pulmonary meds. BP has been up a little on the steroids. Usually it's 122/78 when not on steroids.She is given up all diet drinks and feels somewhat better. She is requesting a referral to pulmonary because she is so frustrated with not getting better.  12/22/2016, the patient is transitioning from Dr. Shirlee LatchMcLean to me, in the last year she has had significant problems with her asthma requiring steroids and multiple inhalers. Her beta blockers were changed to Cardizem that she is tolerating very well and in fact she feels like they are controlling her PVCs much better. She states that she hasn't felt any by mouth he sees in about a year now. She denies any dizziness no presyncope or syncope. No chest pain shortness of breath no lower extremity edema orthopnea or proximal nocturnal dyspnea.  Past Medical History:  Diagnosis Date  . Asthma, moderate persistent    sees Dr Irena CordsVan Winkle  . Diabetes mellitus    A1C 6.2---->  2009  . Family history of adverse reaction to anesthesia    sister had cardiac arrest with rhinoplasty  . Fibrocystic breast   . GERD (gastroesophageal reflux disease)   . HH (hiatus hernia) 09/2014   Dr. Ewing SchleinMagod  . History of hiatal hernia   . Hot flashes   . Hyperlipidemia   . Normal nuclear stress test 2006  . Palpitations    PACs sees cards   . Recurrent cystitis    took  postcoital antibiotic for years, symptoms better after her hysterectomy 1996, symptoms re-surface in 2011  . RUQ abdominal pain 2016   EGD showed small HH, otherwise normal  . Wears glasses     Past Surgical History:  Procedure Laterality Date  . ABDOMINAL HYSTERECTOMY  1996   due to endometriosis and fibroids approximately 1996  . BREAST LUMPECTOMY WITH RADIOACTIVE SEED LOCALIZATION Right 03/12/2014   [facility] [place]  . BREAST SURGERY     Lumpectomy-- BENIGN   . CATARACT EXTRACTION Bilateral 2011  . CESAREAN SECTION    . DILATION AND CURETTAGE OF UTERUS    . LIPOMA EXCISION  1978  . UPPER GASTROINTESTINAL ENDOSCOPY    . UPPER GI ENDOSCOPY  09/11/2014   Small Hiatus Hernia, Dr. Ewing SchleinMagod   Current Medications: Outpatient Medications Prior to Visit  Medication Sig Dispense Refill  . albuterol (PROVENTIL HFA;VENTOLIN HFA) 108 (90 BASE) MCG/ACT inhaler Inhale 2 puffs into the lungs every 6 (six) hours as needed.    Marland Kitchen. aspirin  EC 81 MG tablet Take 81 mg by mouth daily.    . bifidobacterium infantis (ALIGN) capsule Take 1 capsule by mouth daily.    . budesonide-formoterol (SYMBICORT) 160-4.5 MCG/ACT inhaler Inhale 2 puffs into the lungs 2 (two) times daily.    . Continuous Blood Gluc Sensor (FREESTYLE LIBRE SENSOR SYSTEM) MISC CHECK BLOOD SUGAR DAILY AS DIRECTED. CHANGE DEVICE EVERY 10 DAYS 3 each 12  . diclofenac sodium (VOLTAREN) 1 % GEL Apply 2 g topically 4 (four) times daily. 1 Tube 5  . fexofenadine (ALLEGRA) 180 MG tablet Take 180 mg by mouth every morning.     . fluticasone (FLONASE) 50 MCG/ACT nasal spray Place 2 sprays  into the nose daily.      . hydrocortisone 2.5 % cream APPLY EXTERNALLY TO THE AFFECTED AREA TWICE DAILY AS NEEDED 30 g 0  . metFORMIN (GLUCOPHAGE) 1000 MG tablet Take 1.5 tablets by mouth every morning and 1 tablet every evening 225 tablet 1  . montelukast (SINGULAIR) 10 MG tablet Take 10 mg by mouth at bedtime.      Marland Kitchen. MUCUS RELIEF ER 600 MG 12 hr tablet Take 1 tablet by mouth 2 (two) times daily as needed.  0  . Multiple Vitamins-Minerals (MULTIVITAMIN WITH MINERALS) tablet Take 1 tablet by mouth daily.    . nitrofurantoin, macrocrystal-monohydrate, (MACROBID) 100 MG capsule TAKE AS DIRECTED POST COITAL 30 capsule 0  . omeprazole (PRILOSEC) 40 MG capsule Take 1 capsule (40 mg total) by mouth 2 (two) times daily. 180 capsule 3  . PREMARIN vaginal cream INSERT 1 APPLICATORFUL PER VAGINA  TWICE WEEKLY ON TUESDAYS AND FRIDAYS  0  . PRESCRIPTION MEDICATION Allergy Shots    . sitaGLIPtin (JANUVIA) 100 MG tablet Take 1 tablet (100 mg total) by mouth daily. 90 tablet 2  . Tiotropium Bromide Monohydrate (SPIRIVA RESPIMAT) 2.5 MCG/ACT AERS Inhale 2 puffs into the lungs daily.    Marland Kitchen. diltiazem (CARTIA XT) 120 MG 24 hr capsule Take 1 capsule (120 mg total) daily by mouth. Pt needs to schedule appt with Dr. Mayford Knifeurner for more refills. Thank you 30 capsule 0  . rosuvastatin (CRESTOR) 5 MG tablet TAKE 1 TABLET(5 MG) BY MOUTH DAILY 90 tablet 0   No facility-administered medications prior to visit.      Allergies:   Butorphanol tartrate; Hydrocodone; Roxicet [oxycodone-acetaminophen]; Penicillins; and Zofran [ondansetron hcl]   Social History   Socioeconomic History  . Marital status: Married    Spouse name: None  . Number of children: 1  . Years of education: College  . Highest education level: None  Social Needs  . Financial resource strain: None  . Food insecurity - worry: None  . Food insecurity - inability: None  . Transportation needs - medical: None  . Transportation needs - non-medical: None    Occupational History  . Occupation: retired principal  Tobacco Use  . Smoking status: Never Smoker  . Smokeless tobacco: Never Used  Substance and Sexual Activity  . Alcohol use: No  . Drug use: No  . Sexual activity: Yes  Other Topics Concern  . None  Social History Narrative   Household-- pt and husband    Rare caffeine use   Daughter independent, 1 G-child   Engineer, structuralCaregiver for F and M, both w/ dementia    Family History:  The patient's   family history includes Brain cancer in her other; Breast cancer in her mother; Dementia in her mother; Diabetes in her father; Heart disease in her father; Kidney  failure in her father; Lung cancer in her other; Mitral valve prolapse in her mother; Sleep apnea in her father.   ROS:   Please see the history of present illness.    Review of Systems  Constitution: Negative.  HENT: Negative.   Eyes: Negative.   Cardiovascular: Positive for dyspnea on exertion and leg swelling.  Respiratory: Positive for cough, snoring and wheezing.   Hematologic/Lymphatic: Bruises/bleeds easily.  Musculoskeletal: Negative.  Negative for joint pain.  Gastrointestinal: Negative.   Genitourinary: Negative.   Neurological: Negative.    All other systems reviewed and are negative.  PHYSICAL EXAM:   VS:  BP 122/62   Pulse 81   Ht 5\' 4"  (1.626 m)   Wt 181 lb (82.1 kg)   SpO2 97%   BMI 31.07 kg/m   Physical Exam  GEN: Well nourished, well developed, in no acute distress  Neck: no JVD, carotid bruits, or masses Cardiac:RRR; no murmurs, rubs, or gallops  Respiratory:  clear to auscultation bilaterally, normal work of breathing GI: soft, nontender, nondistended, + BS Ext: without cyanosis, clubbing, or edema, Good distal pulses bilaterally MS: no deformity or atrophy  Skin: warm and dry, no rash Psych: euthymic mood, full affect  Wt Readings from Last 3 Encounters:  12/22/16 181 lb (82.1 kg)  09/26/16 182 lb (82.6 kg)  07/11/16 179 lb (81.2 kg)      Studies/Labs Reviewed:   EKG:  EKG is not ordered today.    Recent Labs: 01/17/2016: TSH 0.90 03/28/2016: ALT 13 09/26/2016: BUN 14; Creatinine, Ser 0.70; Hemoglobin 12.1; Platelets 272.0; Potassium 4.6; Sodium 140   Lipid Panel    Component Value Date/Time   CHOL 145 03/28/2016 1038   TRIG 102.0 03/28/2016 1038   HDL 57.30 03/28/2016 1038   CHOLHDL 3 03/28/2016 1038   VLDL 20.4 03/28/2016 1038   LDLCALC 67 03/28/2016 1038    Additional studies/ records that were reviewed today include:   Normal GXT 2006    ASSESSMENT:    1. Hyperlipidemia, unspecified hyperlipidemia type   2. Heart palpitations   3. Essential hypertension      PLAN:  In order of problems listed above:  1. Palpitations have resolved with Cardizem, will continue. 2. Hyperlipidemia, will continue Crestor did she's tolerating well. 3. Hypertension well controlled by Cardizem.   Medication Adjustments/Labs and Tests Ordered: Current medicines are reviewed at length with the patient today.  Concerns regarding medicines are outlined above.  Medication changes, Labs and Tests ordered today are listed in the Patient Instructions below. Patient Instructions  Medication Instructions:   Your physician recommends that you continue on your current medications as directed. Please refer to the Current Medication list given to you today.      Follow-Up:  Your physician wants you to follow-up in: ONE YEAR WITH DR Johnell Comings will receive a reminder letter in the mail two months in advance. If you don't receive a letter, please call our office to schedule the follow-up appointment.        If you need a refill on your cardiac medications before your next appointment, please call your pharmacy.      Signed, Tobias Alexander, MD  12/22/2016 4:51 PM    [facility] [address] [place], [place], [place]  [address] Phone: [phone]; Fax: [phone]

## 2016-12-22 NOTE — Patient Instructions (Signed)

## 2017-01-02 LAB — HM DIABETES EYE EXAM

## 2017-01-06 HISTORY — PX: COSMETIC SURGERY: SHX468

## 2017-01-22 ENCOUNTER — Other Ambulatory Visit: Payer: Self-pay | Admitting: Obstetrics and Gynecology

## 2017-01-22 DIAGNOSIS — R928 Other abnormal and inconclusive findings on diagnostic imaging of breast: Secondary | ICD-10-CM

## 2017-01-26 ENCOUNTER — Ambulatory Visit
Admission: RE | Admit: 2017-01-26 | Discharge: 2017-01-26 | Disposition: A | Discharge status: Home / Self Care | Payer: BC Managed Care – PPO | Source: Ambulatory Visit | Attending: Obstetrics and Gynecology | Admitting: Obstetrics and Gynecology

## 2017-01-26 ENCOUNTER — Other Ambulatory Visit: Payer: BC Managed Care – PPO

## 2017-01-26 DIAGNOSIS — R928 Other abnormal and inconclusive findings on diagnostic imaging of breast: Secondary | ICD-10-CM

## 2017-02-01 ENCOUNTER — Emergency Department (HOSPITAL_COMMUNITY): Payer: BC Managed Care – PPO

## 2017-02-01 ENCOUNTER — Encounter (HOSPITAL_COMMUNITY): Payer: Self-pay

## 2017-02-01 ENCOUNTER — Other Ambulatory Visit: Payer: Self-pay

## 2017-02-01 ENCOUNTER — Emergency Department (HOSPITAL_COMMUNITY)
Admission: EM | Admit: 2017-02-01 | Discharge: 2017-02-01 | Disposition: A | Discharge status: Home / Self Care | Payer: BC Managed Care – PPO | Attending: Emergency Medicine | Admitting: Emergency Medicine

## 2017-02-01 DIAGNOSIS — S065XAA Traumatic subdural hemorrhage with loss of consciousness status unknown, initial encounter: Secondary | ICD-10-CM

## 2017-02-01 DIAGNOSIS — M25512 Pain in left shoulder: Secondary | ICD-10-CM | POA: Diagnosis not present

## 2017-02-01 DIAGNOSIS — Z885 Allergy status to narcotic agent status: Secondary | ICD-10-CM | POA: Insufficient documentation

## 2017-02-01 DIAGNOSIS — Y999 Unspecified external cause status: Secondary | ICD-10-CM | POA: Diagnosis not present

## 2017-02-01 DIAGNOSIS — S065X9A Traumatic subdural hemorrhage with loss of consciousness of unspecified duration, initial encounter: Secondary | ICD-10-CM

## 2017-02-01 DIAGNOSIS — Z79899 Other long term (current) drug therapy: Secondary | ICD-10-CM | POA: Diagnosis not present

## 2017-02-01 DIAGNOSIS — Y929 Unspecified place or not applicable: Secondary | ICD-10-CM | POA: Diagnosis not present

## 2017-02-01 DIAGNOSIS — Y9389 Activity, other specified: Secondary | ICD-10-CM | POA: Insufficient documentation

## 2017-02-01 DIAGNOSIS — W1789XA Other fall from one level to another, initial encounter: Secondary | ICD-10-CM | POA: Insufficient documentation

## 2017-02-01 DIAGNOSIS — J45909 Unspecified asthma, uncomplicated: Secondary | ICD-10-CM | POA: Insufficient documentation

## 2017-02-01 DIAGNOSIS — S065X0A Traumatic subdural hemorrhage without loss of consciousness, initial encounter: Secondary | ICD-10-CM | POA: Diagnosis not present

## 2017-02-01 DIAGNOSIS — S0990XA Unspecified injury of head, initial encounter: Secondary | ICD-10-CM | POA: Diagnosis present

## 2017-02-01 DIAGNOSIS — W19XXXA Unspecified fall, initial encounter: Secondary | ICD-10-CM

## 2017-02-01 DIAGNOSIS — E119 Type 2 diabetes mellitus without complications: Secondary | ICD-10-CM | POA: Diagnosis not present

## 2017-02-01 DIAGNOSIS — Z7984 Long term (current) use of oral hypoglycemic drugs: Secondary | ICD-10-CM | POA: Insufficient documentation

## 2017-02-01 LAB — CBG MONITORING, ED
Glucose-Capillary: 110 mg/dL — ABNORMAL HIGH (ref 65–99)
Glucose-Capillary: 90 mg/dL (ref 65–99)

## 2017-02-01 MED ORDER — FENTANYL CITRATE (PF) 100 MCG/2ML IJ SOLN
50.0000 ug | Freq: Once | INTRAMUSCULAR | Status: AC
  Administered 2017-02-01: 50 ug via INTRAMUSCULAR
  Filled 2017-02-01: qty 2

## 2017-02-01 MED ORDER — LORAZEPAM 0.5 MG PO TABS
0.5000 mg | ORAL_TABLET | Freq: Once | ORAL | Status: AC
  Administered 2017-02-01: 0.5 mg via ORAL
  Filled 2017-02-01: qty 1

## 2017-02-01 NOTE — ED Notes (Signed)
Patient ambulated to restroom with standby assist.  

## 2017-02-01 NOTE — ED Triage Notes (Signed)
Patient added that she was putting boots into the closet and the boot heel hit her forehead. Patient also adds that the left shoulder pain radiates into the left hand.  Patient also wanted to add that she hit her back on the floor and is "now having Kidney sensations"

## 2017-02-01 NOTE — ED Notes (Signed)
Patient transported to X-ray 

## 2017-02-01 NOTE — Discharge Instructions (Signed)
Please read instructions below. Apply ice to your shoulder for 20 minutes at a time. You can take tylenol every 4 hours as needed for pain. You can take your tramadol as previously prescribed, as needed for moderate to severe pain. Schedule an appointment with the neurosurgeon, in 1 week if symptoms worsen or persist. Otherwise, you can follow up with your primary care provider in 2-3 days regarding your diagnosis today.   Return to the ER for severely worsening headache, vision changes, fever, weakness or numbness, or new or concerning symptoms.

## 2017-02-01 NOTE — ED Provider Notes (Signed)
Care [facility] at shift change [facility], pendind CT head and c-spine. See her note for complete HPI.  Likely, patient presenting status post mechanical fall off of a stool, falling backwards onto her left side.   Patient hit the back of her head on the carpeted floor, without LOC. Not on anticoagulation.  Imaging of shoulder and L-spine negative.  Patient complaining of mild dizziness in the ED, with history of peripheral vertigo.  Pain treated successfully in the ED, as well as given Ativan for dizziness with significant improvement.  Plan to reassess patient following CT results and ambulate. Physical Exam  BP 135/79 (BP Location: Right Arm)   Pulse 71   Temp 98 F (36.7 C) (Oral)   Resp 18   Ht 5\' 4"  (1.626 m)   Wt 81.6 kg (180 lb)   SpO2 100%   BMI 30.90 kg/m   Physical Exam  Constitutional: She appears well-developed and well-nourished.  HENT:  Head: Normocephalic and atraumatic.  Eyes: Conjunctivae are normal.  Pulmonary/Chest: Effort normal.  Abdominal: Soft.  Neurological: She is alert.  Mental Status:  Alert, oriented, thought content appropriate, able to give a coherent history. Speech fluent without evidence of aphasia. Able to follow 2 step commands without difficulty.  Cranial Nerves:  II:  Peripheral visual fields grossly normal, pupils equal, round, reactive to light III,IV, VI: ptosis not present, extra-ocular motions intact bilaterally  V,VII: smile symmetric, facial light touch sensation equal VIII: hearing grossly normal to voice  X: uvula elevates symmetrically  XI: bilateral shoulder shrug symmetric and strong XII: midline tongue extension without fassiculations Motor:  Normal tone. 5/5 in upper and lower extremities bilaterally including strong and equal grip strength and dorsiflexion/plantar flexion Sensory: Pinprick and light touch normal in all extremities.  Deep Tendon Reflexes: 2+ and symmetric in the biceps and patella Cerebellar:  normal finger-to-nose with bilateral upper extremities Gait: normal gait and balance CV: distal pulses palpable throughout  Skin: Skin is warm.  Small abrasion/contusion noted to forehead.  Psychiatric: She has a normal mood and affect. Her behavior is normal.  Nursing note and vitals reviewed.  ED Course/Procedures    Procedures Results for orders placed or performed during the hospital encounter of 02/01/17  CBG monitoring, ED  Result Value Ref Range   Glucose-Capillary 110 (H) 65 - 99 mg/dL  CBG monitoring, ED  Result Value Ref Range   Glucose-Capillary 90 65 - 99 mg/dL   Dg Lumbar Spine Complete  Result Date: 02/01/2017 CLINICAL DATA:  Fall with pain EXAM: LUMBAR SPINE - COMPLETE 4+ VIEW COMPARISON:  None. FINDINGS: Transitional anatomy suspected with 6 non rib-bearing lumbar type vertebra. Lumbarized S1 segment will be designated S1, first non rib-bearing vertebra designated L1. Alignment is within normal limits. Vertebral body heights are normal. Disc spaces are within normal limits. IMPRESSION: Negative. Electronically Signed   By: Jasmine PangKim  Fujinaga M.D.   On: 02/01/2017 15:51   Ct Head Wo Contrast  Result Date: 02/01/2017 CLINICAL DATA:  Headache and neck pain after fall. EXAM: CT HEAD WITHOUT CONTRAST CT CERVICAL SPINE WITHOUT CONTRAST TECHNIQUE: Multidetector CT imaging of the head and cervical spine was performed following the standard protocol without intravenous contrast. Multiplanar CT image reconstructions of the cervical spine were also generated. COMPARISON:  MRI cervical spine dated May 04, 2015. CT head dated September 05, 2008. FINDINGS: CT HEAD FINDINGS Brain: There is a thin, 3 mm subdural hematoma overlying the superior left frontal gyrus near the skull vertex (series 4,  image 35). No significant mass effect. No additional intracranial hemorrhage. No evidence of acute infarction or hydrocephalus. Vascular: No hyperdense vessel or unexpected calcification. Skull: Normal.  Negative for fracture or focal lesion. Sinuses/Orbits: No acute finding. Other: None. CT CERVICAL SPINE FINDINGS Alignment: Slight reversal of the normal cervical lordosis, centered at C6-C7. No traumatic malalignment Skull base and vertebrae: No acute fracture. No primary bone lesion or focal pathologic process. Soft tissues and spinal canal: No prevertebral fluid or swelling. No visible canal hematoma. Disc levels:  Normal. Upper chest: Negative. Other: None. IMPRESSION: 1. Thin, 3 mm subdural hematoma overlying the superior left frontal gyrus near the skull vertex. No significant mass effect. 2.  No acute cervical spine fracture. Critical Value/emergent results were called by telephone at the time of interpretation on 02/01/2017 at 5:12 pm to Dr. Sherlie Ban, who verbally acknowledged these results. Electronically Signed   By: Obie Dredge M.D.   On: 02/01/2017 17:13   Ct Cervical Spine Wo Contrast  Result Date: 02/01/2017 CLINICAL DATA:  Headache and neck pain after fall. EXAM: CT HEAD WITHOUT CONTRAST CT CERVICAL SPINE WITHOUT CONTRAST TECHNIQUE: Multidetector CT imaging of the head and cervical spine was performed following the standard protocol without intravenous contrast. Multiplanar CT image reconstructions of the cervical spine were also generated. COMPARISON:  MRI cervical spine dated May 04, 2015. CT head dated September 05, 2008. FINDINGS: CT HEAD FINDINGS Brain: There is a thin, 3 mm subdural hematoma overlying the superior left frontal gyrus near the skull vertex (series 4, image 35). No significant mass effect. No additional intracranial hemorrhage. No evidence of acute infarction or hydrocephalus. Vascular: No hyperdense vessel or unexpected calcification. Skull: Normal. Negative for fracture or focal lesion. Sinuses/Orbits: No acute finding. Other: None. CT CERVICAL SPINE FINDINGS Alignment: Slight reversal of the normal cervical lordosis, centered at C6-C7. No traumatic malalignment  Skull base and vertebrae: No acute fracture. No primary bone lesion or focal pathologic process. Soft tissues and spinal canal: No prevertebral fluid or swelling. No visible canal hematoma. Disc levels:  Normal. Upper chest: Negative. Other: None. IMPRESSION: 1. Thin, 3 mm subdural hematoma overlying the superior left frontal gyrus near the skull vertex. No significant mass effect. 2.  No acute cervical spine fracture. Critical Value/emergent results were called by telephone at the time of interpretation on 02/01/2017 at 5:12 pm to Dr. Sherlie Ban, who verbally acknowledged these results. Electronically Signed   By: Obie Dredge M.D.   On: 02/01/2017 17:13   Dg Shoulder Left  Result Date: 02/01/2017 CLINICAL DATA:  56 year old female with a history of left shoulder pain EXAM: LEFT SHOULDER - 2+ VIEW COMPARISON:  None. FINDINGS: No acute displaced fracture. No focal soft tissue swelling. No radiopaque foreign body. Mild degenerative changes of the acromioclavicular joint. Unremarkable proximal humerus. IMPRESSION: No evidence of acute bony abnormality Electronically Signed   By: Gilmer Mor D.O.   On: 02/01/2017 15:49   Dg Humerus Left  Result Date: 02/01/2017 CLINICAL DATA:  Fall.  Left upper arm pain. EXAM: LEFT HUMERUS - 2+ VIEW COMPARISON:  None. FINDINGS: Two-view exam of the left humerus shows no evidence for an acute fracture. No worrisome lytic or sclerotic osseous abnormality. Diffuse demineralization noted. IMPRESSION: No evidence for humerus fracture. Elbow is not well evaluated on this film and if there is clinical concern for injury at the elbow, dedicated elbow films recommended. Electronically Signed   By: Kennith Center M.D.   On: 02/01/2017 15:50    MDM  CT head showing small 3mm subdural hematoma overlying left frontal gyrus. Re-evaluated patient, no focal neuro deficits. Ativan improved dizziness. Mild HA reported as pressure across her forehead. Will consult  neurosurgery.  Neurosurgery, Dr. Venetia MaxonStern, recommending patient is safe for discharge with symptomatic management for headache and PCP follow-up.  Neurosurgery referral given as needed.  Discussed these recommendations with patient, as well as medical management for shoulder pain and strict return precautions.  Patient verbalized understanding and agreed with care plan.  Patient discussed with Dr. Criss AlvineGoldston.  Discussed results, findings, treatment and follow up. Patient advised of return precautions. Patient verbalized understanding and agreed with plan.    Robinson, [place] N, PA-C 02/01/17 2337    Pricilla LovelessGoldston, Scott, MD 02/02/17 Burna Mortimer0010

## 2017-02-01 NOTE — ED Triage Notes (Signed)
Per EMS- Patient reports that she was on a step stool and was putting a box on a shelf in her closet and the step stool tipped over and the patient fell backwards, landing on her buttocks, then her head hit the carpet. Patient denies any neck or back pain. No blood thinners. Patient did c/o dizziness when EMS assisted up.

## 2017-02-01 NOTE — ED Notes (Signed)
Bed: WA20 Expected date:  Expected time:  Means of arrival:  Comments: Hold for triage 

## 2017-02-01 NOTE — ED Provider Notes (Signed)
[facility]-EMERGENCY DEPT Provider Note   CSN: [id number] Arrival date & time: 02/01/17  1158     History   Chief Complaint Chief Complaint  Patient presents with  . Fall  . Head Injury  . Shoulder Pain  . forehead injury    HPI Karen CottonBeverly B Brunner is a 56 y.o. female.  HPI Karen Barnes is a 56 y.o. female presents to ED with complaint of a fall.  Patient states she was reaching to grab something while standing on the step stool, states she fell hitting her back of her head, hitting back, left shoulder on the ground.  She states the boot then fell and hit her on the face.  She states she might have lost consciousness for a second.  She states since then she has had headache, dizziness, weakness.  She has severe pain in the left shoulder and left upper arm, states unable to move her arm due to pain.  Reports recent pain through that same shoulder due to rotator cuff strain which she has been doing rehab for. After the fall, patient was unable to get up, so EMS was called patient reports severe pain in the neck, lower back  as well.  Patient denies any vomiting.  No numbness or weakness in extremities.  Past Medical History:  Diagnosis Date  . Asthma, moderate persistent    sees Dr Irena CordsVan Winkle  . Diabetes mellitus    A1C 6.2---->  2009  . Family history of adverse reaction to anesthesia    sister had cardiac arrest with rhinoplasty  . Fibrocystic breast   . GERD (gastroesophageal reflux disease)   . HH (hiatus hernia) 09/2014   Dr. Ewing SchleinMagod  . History of hiatal hernia   . Hot flashes   . Hyperlipidemia   . Normal nuclear stress test 2006  . Palpitations    PACs sees cards   . Recurrent cystitis    took  postcoital antibiotic for years, symptoms better after her hysterectomy 1996, symptoms re-surface in 2011  . RUQ abdominal pain 2016   EGD showed small HH, otherwise normal  . Wears glasses     Patient Active Problem List   Diagnosis Date Noted  .  Edema of both ankles 02/03/2016  . 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 OSA (obstructive sleep apnea) 02/03/2016  . Solitary pulmonary nodule 11/03/2015  . Cough 11/03/2015  . PCP NOTES >>>>>>>>>>>>>>>>>>>>>>>>>>>>>>>>>>> 03/26/2015  . Annual physical exam 01/10/2013  . Family history of breast cancer 02/23/2012  . At risk for coronary artery disease 02/07/2012  . Obesity 12/13/2010  . Heart palpitations 12/13/2010  . Elevated blood pressure reading 12/13/2010  . Hyperlipidemia 03/07/2007  . [facility]  . CYSTITIS, RECURRENT 03/07/2007  . DMII (diabetes mellitus, type 2) (HCC) 03/07/2007  . Asthma 07/12/2006  . GERD 07/12/2006    Past Surgical History:  Procedure Laterality Date  . ABDOMINAL HYSTERECTOMY  1996   due to endometriosis and fibroids approximately 1996  . BREAST EXCISIONAL BIOPSY Right 03/12/2014   [facility]  . BREAST LUMPECTOMY WITH RADIOACTIVE SEED LOCALIZATION Right 03/12/2014   Procedure: BREAST LUMPECTOMY WITH RADIOACTIVE SEED LOCALIZATION;  Surgeon: Harriette Bouillonhomas Cornett, MD;  Location: [facility];  Service: General;  Laterality: Right;  . BREAST SURGERY     Lumpectomy-- BENIGN   . CATARACT EXTRACTION Bilateral 2011  . CESAREAN SECTION    . DILATION AND CURETTAGE OF UTERUS    . LIPOMA EXCISION  1978  . UPPER GASTROINTESTINAL ENDOSCOPY    .  UPPER GI ENDOSCOPY  09/11/2014   Small Hiatus Hernia, Dr. Ewing SchleinMagod    OB History    No data available       Home Medications    Prior to Admission medications   Medication Sig Start Date End Date Taking? Authorizing Provider  albuterol (PROVENTIL HFA;VENTOLIN HFA) 108 (90 BASE) MCG/ACT inhaler Inhale 2 puffs into the lungs every 6 (six) hours as needed.   Yes [provider]  aspirin EC 81 MG tablet Take 81 mg by mouth daily.   Yes [provider]  bifidobacterium infantis (ALIGN) capsule Take 1 capsule by mouth daily.   Yes [provider]  budesonide-formoterol (SYMBICORT) 160-4.5  MCG/ACT inhaler Inhale 2 puffs into the lungs 2 (two) times daily. 09/14/15  Yes [provider]  Calcium Carbonate-Vitamin D (CALCIUM 600+D) 600-200 MG-UNIT TABS Take 1 tablet by mouth daily.   Yes [provider]  diclofenac sodium (VOLTAREN) 1 % GEL Apply 2 g topically 4 (four) times daily. 11/02/16  Yes Paz, Nolon RodJose E, MD  diltiazem (CARTIA XT) 120 MG 24 hr capsule Take 1 capsule (120 mg total) daily by mouth. 12/22/16  Yes Lars MassonNelson, Katarina H, MD  EPINEPHrine 0.3 mg/0.3 mL IJ SOAJ injection INJECT INTRAMUSCULARLY AS DIRECTED 01/17/17  Yes [provider]  fexofenadine (ALLEGRA) 180 MG tablet Take 180 mg by mouth every morning.    Yes [provider]  fluticasone (FLONASE) 50 MCG/ACT nasal spray Place 2 sprays into the nose daily. AND IF NEEDED 2 SPRAYS AT NIGHT   Yes [provider]  hydrocortisone 2.5 % cream APPLY EXTERNALLY TO THE AFFECTED AREA TWICE DAILY AS NEEDED 01/28/16  Yes Wanda PlumpPaz, Jose E, MD  metFORMIN (GLUCOPHAGE) 1000 MG tablet Take 1.5 tablets by mouth every morning and 1 tablet every evening 12/07/16  Yes Paz, Elita QuickJose E, MD  montelukast (SINGULAIR) 10 MG tablet Take 10 mg by mouth at bedtime.     Yes [provider]  Multiple Vitamins-Minerals (MULTIVITAMIN WITH MINERALS) tablet Take 1 tablet by mouth daily.   Yes [provider]  omeprazole (PRILOSEC) 40 MG capsule Take 1 capsule (40 mg total) by mouth 2 (two) times daily. 06/24/15  Yes Wanda PlumpPaz, Jose E, MD  PRESCRIPTION MEDICATION Allergy Shots   Yes [provider]  rosuvastatin (CRESTOR) 5 MG tablet TAKE 1 TABLET(5 MG) BY MOUTH DAILY 12/22/16  Yes Lars MassonNelson, Katarina H, MD  sitaGLIPtin (JANUVIA) 100 MG tablet Take 1 tablet (100 mg total) by mouth daily. 10/16/16  Yes Paz, Nolon RodJose E, MD  Tiotropium Bromide Monohydrate (SPIRIVA RESPIMAT) 2.5 MCG/ACT AERS Inhale 2 puffs into the lungs daily.   Yes [provider]  Continuous Blood Gluc Sensor (FREESTYLE LIBRE SENSOR SYSTEM)  MISC CHECK BLOOD SUGAR DAILY AS DIRECTED. CHANGE DEVICE EVERY 10 DAYS 09/29/16   Wanda PlumpPaz, Jose E, MD  nitrofurantoin, macrocrystal-monohydrate, (MACROBID) 100 MG capsule TAKE AS DIRECTED POST COITAL Patient not taking: Reported on 02/01/2017 01/28/16   Wanda PlumpPaz, Jose E, MD    Family History Family History  Problem Relation Age of Onset  . Mitral valve prolapse Mother   . Breast cancer Mother 2063       M had ca insitu (age 58), aunt   . Dementia Mother   . Heart disease Father        early   . Diabetes Father   . Kidney failure Father   . Sleep apnea Father   . Brain cancer Other        2 fam members   .  Lung cancer Other        uncle, heavy smoker  . Breast cancer Maternal Aunt 60  . Colon cancer Neg Hx     Social History Social History   Tobacco Use  . Smoking status: Never Smoker  . Smokeless tobacco: Never Used  Substance Use Topics  . Alcohol use: No  . Drug use: No     Allergies   Butorphanol tartrate; Hydrocodone; Roxicet [oxycodone-acetaminophen]; Penicillins; and Zofran [ondansetron hcl]   Review of Systems Review of Systems  Constitutional: Negative for chills and fever.  Respiratory: Negative for cough, chest tightness and shortness of breath.   Cardiovascular: Negative for chest pain, palpitations and leg swelling.  Gastrointestinal: Positive for nausea. Negative for abdominal pain, diarrhea and vomiting.  Genitourinary: Negative for dysuria, flank pain, pelvic pain, vaginal bleeding, vaginal discharge and vaginal pain.  Musculoskeletal: Positive for arthralgias, back pain and myalgias. Negative for neck pain and neck stiffness.  Skin: Negative for rash.  Neurological: Positive for dizziness and headaches. Negative for weakness.  All other systems reviewed and are negative.    Physical Exam Updated Vital Signs BP (!) 121/59 (BP Location: Right Arm)   Pulse 72   Temp 98 F (36.7 C) (Oral)   Resp 18   Ht 5\' 4"  (1.626 m)   Wt 81.6 kg (180 lb)   SpO2 99%    BMI 30.90 kg/m   Physical Exam  Constitutional: She is oriented to person, place, and time. She appears well-developed and well-nourished. No distress.  HENT:  Head: Normocephalic and atraumatic.  Eyes: Conjunctivae and EOM are normal. Pupils are equal, round, and reactive to light.  Neck: Normal range of motion. Neck supple.  Midline inferior cervical spinal tenderness  Cardiovascular: Normal rate, regular rhythm and normal heart sounds.  Pulmonary/Chest: Effort normal and breath sounds normal. No respiratory distress. She has no wheezes. She has no rales.  Musculoskeletal: Normal range of motion. She exhibits no edema.  Midline lumbar spine tenderness.  Full range of motion of bilateral hips, knees, ankles.  Tenderness to palpation over posterior left shoulder.  Pain with any range of motion of the shoulder joint.  Tenderness extends into the bicep, no pain at the insertion point.  Bicep strength is intact.  Full range of motion of the elbow and wrist.  Distal radial pulses intact  Neurological: She is alert and oriented to person, place, and time. No cranial nerve deficit. Coordination normal.  5/5 and equal upper and lower extremity strength bilaterally. Equal grip strength bilaterally. Normal finger to nose and heel to shin. No pronator drift.   Skin: Skin is warm and dry.  Psychiatric: She has a normal mood and affect. Her behavior is normal.  Nursing note and vitals reviewed.    ED Treatments / Results  Labs (all labs ordered are listed, but only abnormal results are displayed) Labs Reviewed - No data to display  EKG  EKG Interpretation None       Radiology Dg Lumbar Spine Complete  Result Date: 02/01/2017 CLINICAL DATA:  Fall with pain EXAM: LUMBAR SPINE - COMPLETE 4+ VIEW COMPARISON:  None. FINDINGS: Transitional anatomy suspected with 6 non rib-bearing lumbar type vertebra. Lumbarized S1 segment will be designated S1, first non rib-bearing vertebra designated L1.  Alignment is within normal limits. Vertebral body heights are normal. Disc spaces are within normal limits. IMPRESSION: Negative. Electronically Signed   By: Jasmine Pang M.D.   On: 02/01/2017 15:51   Dg Shoulder Left  Result  Date: 02/01/2017 CLINICAL DATA:  56 year old female with a history of left shoulder pain EXAM: LEFT SHOULDER - 2+ VIEW COMPARISON:  None. FINDINGS: No acute displaced fracture. No focal soft tissue swelling. No radiopaque foreign body. Mild degenerative changes of the acromioclavicular joint. Unremarkable proximal humerus. IMPRESSION: No evidence of acute bony abnormality Electronically Signed   By: Gilmer Mor D.O.   On: 02/01/2017 15:49   Dg Humerus Left  Result Date: 02/01/2017 CLINICAL DATA:  Fall.  Left upper arm pain. EXAM: LEFT HUMERUS - 2+ VIEW COMPARISON:  None. FINDINGS: Two-view exam of the left humerus shows no evidence for an acute fracture. No worrisome lytic or sclerotic osseous abnormality. Diffuse demineralization noted. IMPRESSION: No evidence for humerus fracture. Elbow is not well evaluated on this film and if there is clinical concern for injury at the elbow, dedicated elbow films recommended. Electronically Signed   By: Kennith Center M.D.   On: 02/01/2017 15:50    Procedures Procedures (including critical care time)  Medications Ordered in ED Medications  fentaNYL (SUBLIMAZE) injection 50 mcg (50 mcg Intramuscular Given 02/01/17 1422)     Initial Impression / Assessment and Plan / ED Course  I have reviewed the triage vital signs and the nursing notes.  Pertinent labs & imaging results that were available during my care of the patient were reviewed by me and considered in my medical decision making (see chart for details).     Patient in ED after mechanical fall.  Reports severe headache, also has pain to the left shoulder and left upper arm, lower back.  Possible loss of consciousness.  Takes aspirin 81 mg daily otherwise no vision.  Normal  neurological exam.  Will get CT head and cervical spine.  We will get x-rays of the shoulder and lumbar spine.  4:06 PM Patient became very dizzy while in x-rays.  She states the room was spinning around.  She has history of vertigo.  I will give her Ativan to see if her symptoms would improve..  Patient states her pain is much better at this time.  I discussed results of her x-ray with her.  Discussed that CT scans are still necessary and discussed the plan that if CTs are negative, will try to ambulate her and discharge her home.  Patient agreed.  Pt signed at shift change to PA [place] Robinson   Vitals:   02/01/17 1208 02/01/17 1228 02/01/17 1231 02/01/17 1541  BP:  (!) 121/59  135/79  Pulse:  72  71  Resp:  18  18  Temp:  98 F (36.7 C)    TempSrc:  Oral    SpO2: 100% 99%  100%  Weight:   81.6 kg (180 lb)   Height:   5\' 4"  (1.626 m)     Final Clinical Impressions(s) / ED Diagnoses   Final diagnoses:  None    ED Discharge Orders    None       Jaynie Crumble, PA-C 02/01/17 1609    Azalia Bilis, MD 02/01/17 [phone]

## 2017-02-01 NOTE — ED Notes (Signed)
ED Provider at bedside. 

## 2017-02-02 ENCOUNTER — Telehealth: Payer: Self-pay

## 2017-02-02 NOTE — Telephone Encounter (Signed)
Seen at ED yesterday for fall, head injury- needs ED follow-up sometime next week- can you call to schedule please?

## 2017-02-05 NOTE — Telephone Encounter (Signed)
Appt scheduled 02/08/2017.

## 2017-02-07 ENCOUNTER — Other Ambulatory Visit: Payer: BC Managed Care – PPO

## 2017-02-08 ENCOUNTER — Ambulatory Visit: Payer: BC Managed Care – PPO | Admitting: Internal Medicine

## 2017-02-08 ENCOUNTER — Encounter: Payer: Self-pay | Admitting: Internal Medicine

## 2017-02-08 VITALS — BP 126/72 | HR 72 | Temp 98.0°F | Resp 14 | Ht 64.0 in | Wt 184.2 lb

## 2017-02-08 DIAGNOSIS — S065X9A Traumatic subdural hemorrhage with loss of consciousness of unspecified duration, initial encounter: Secondary | ICD-10-CM | POA: Diagnosis not present

## 2017-02-08 DIAGNOSIS — S065XAA Traumatic subdural hemorrhage with loss of consciousness status unknown, initial encounter: Secondary | ICD-10-CM

## 2017-02-08 NOTE — Patient Instructions (Signed)
Please call or go to the ER with any subdural symptoms   No Aspirin

## 2017-02-08 NOTE — Progress Notes (Signed)
Pre visit review using our clinic review tool, if applicable. No additional management support is needed unless otherwise documented below in the visit note. 

## 2017-02-08 NOTE — Progress Notes (Signed)
Subjective:    Patient ID: Karen Barnes, female    DOB: 08-05-1960, 57 y.o.   MRN: [id number]  DOS:  02/08/2017 Type of visit - description : ER f/u Interval history: Went to the ER 02/01/2017, had a mechanical fall off of a stool, fell backwards onto her left side. No LOC.  Patient is not anticoagulated, but on ASA 81 ; developed headaches, dizziness, weakness. + Left shoulder , neck pain  CT head: 3 mm subdural hematoma CT neck with no acute changes X-rays of the shoulder negative Neurosurgery consulted: Was recommended to discharge home with symptomatic follow-up.   Review of Systems After she left the ER she continue with headache and severe dizziness with head movement.  Some tenderness to palpation of the back of the head.  Also some tingling in the fingers and nausea. Symptoms gradually resolve, for the last 48 hours she is essentially back to normal. She had shoulder injury, pain increase temporarily after the fall. She currently denies any double vision, nausea, numbness.   Past Medical History:  Diagnosis Date  . Asthma, moderate persistent    sees Dr Irena Cords  . Diabetes mellitus    A1C 6.2---->  2009  . Family history of adverse reaction to anesthesia    sister had cardiac arrest with rhinoplasty  . Fibrocystic breast   . GERD (gastroesophageal reflux disease)   . HH (hiatus hernia) 09/2014   Dr. Ewing Schlein  . History of hiatal hernia   . Hot flashes   . Hyperlipidemia   . Normal nuclear stress test 2006  . Palpitations    PACs sees cards   . Recurrent cystitis    took  postcoital antibiotic for years, symptoms better after her hysterectomy 1996, symptoms re-surface in 2011  . RUQ abdominal pain 2016   EGD showed small HH, otherwise normal  . Wears glasses     Past Surgical History:  Procedure Laterality Date  . ABDOMINAL HYSTERECTOMY  1996   due to endometriosis and fibroids approximately 1996  . BREAST EXCISIONAL BIOPSY Right 03/12/2014   [facility]  .  BREAST LUMPECTOMY WITH RADIOACTIVE SEED LOCALIZATION Right 03/12/2014   Procedure: BREAST LUMPECTOMY WITH RADIOACTIVE SEED LOCALIZATION;  Surgeon: Harriette Bouillon, MD;  Location: [facility];  Service: General;  Laterality: Right;  . BREAST SURGERY     Lumpectomy-- BENIGN   . CATARACT EXTRACTION Bilateral 2011  . CESAREAN SECTION    . COSMETIC SURGERY  01/2017   MonaLisa Touch w/ GYN  . DILATION AND CURETTAGE OF UTERUS    . LIPOMA EXCISION  1978  . UPPER GASTROINTESTINAL ENDOSCOPY    . UPPER GI ENDOSCOPY  09/11/2014   Small Hiatus Hernia, Dr. Ewing Schlein    Social History   Socioeconomic History  . Marital status: Married    Spouse name: Not on file  . Number of children: 1  . Years of education: College  . Highest education level: Not on file  Social Needs  . Financial resource strain: Not on file  . Food insecurity - worry: Not on file  . Food insecurity - inability: Not on file  . Transportation needs - medical: Not on file  . Transportation needs - non-medical: Not on file  Occupational History  . Occupation: retired principal  Tobacco Use  . Smoking status: Never Smoker  . Smokeless tobacco: Never Used  Substance and Sexual Activity  . Alcohol use: No  . Drug use: No  . Sexual activity: Yes  Other  Topics Concern  . Not on file  Social History Narrative   Household-- pt and husband    Rare caffeine use   Daughter independent, 1 G-child   Engineer, structural for F and M, both w/ dementia      Allergies as of 02/08/2017      Reactions   Butorphanol Tartrate Other (See Comments)   REACTION: hallucinations   Hydrocodone Other (See Comments)   HALLUCINATIONS   Roxicet [oxycodone-acetaminophen] Other (See Comments)   HALLUCINATIONS   Penicillins Hives   Occurred at age 49/CHILDHOOD ALLERGY Has patient had a PCN reaction causing immediate rash, facial/tongue/throat swelling, SOB or lightheadedness with hypotension: Unknown Has patient had a PCN reaction causing severe  rash involving mucus membranes or skin necrosis: Unknown Has patient had a PCN reaction that required hospitalization: Unknown Has patient had a PCN reaction occurring within the last 10 years: No If all of the above answers are "NO", then may proceed with Cephalosporin use.   Zofran [ondansetron Hcl] Other (See Comments)   Hair loss      Medication List        Accurate as of 02/08/17  3:29 PM. Always use your most recent med list.          albuterol 108 (90 Base) MCG/ACT inhaler Commonly known as:  PROVENTIL HFA;VENTOLIN HFA Inhale 2 puffs into the lungs every 6 (six) hours as needed.   bifidobacterium infantis capsule Take 1 capsule by mouth daily.   CALCIUM 600+D 600-200 MG-UNIT Tabs Generic drug:  Calcium Carbonate-Vitamin D Take 1 tablet by mouth daily.   diclofenac sodium 1 % Gel Commonly known as:  VOLTAREN Apply 2 g topically 4 (four) times daily.   diltiazem 120 MG 24 hr capsule Commonly known as:  CARTIA XT Take 1 capsule (120 mg total) daily by mouth.   EPINEPHrine 0.3 mg/0.3 mL Soaj injection Commonly known as:  EPI-PEN INJECT INTRAMUSCULARLY AS DIRECTED   fexofenadine 180 MG tablet Commonly known as:  ALLEGRA Take 180 mg by mouth every morning.   fluticasone 50 MCG/ACT nasal spray Commonly known as:  FLONASE Place 2 sprays into the nose daily. AND IF NEEDED 2 SPRAYS AT NIGHT   FREESTYLE LIBRE SENSOR SYSTEM Misc CHECK BLOOD SUGAR DAILY AS DIRECTED. CHANGE DEVICE EVERY 10 DAYS   hydrocortisone 2.5 % cream APPLY EXTERNALLY TO THE AFFECTED AREA TWICE DAILY AS NEEDED   metFORMIN 1000 MG tablet Commonly known as:  GLUCOPHAGE Take 1.5 tablets by mouth every morning and 1 tablet every evening   montelukast 10 MG tablet Commonly known as:  SINGULAIR Take 10 mg by mouth at bedtime.   multivitamin with minerals tablet Take 1 tablet by mouth daily.   nitrofurantoin (macrocrystal-monohydrate) 100 MG capsule Commonly known as:  MACROBID TAKE AS DIRECTED  POST COITAL   omeprazole 40 MG capsule Commonly known as:  PRILOSEC Take 1 capsule (40 mg total) by mouth 2 (two) times daily.   PRESCRIPTION MEDICATION Allergy Shots   rosuvastatin 5 MG tablet Commonly known as:  CRESTOR TAKE 1 TABLET(5 MG) BY MOUTH DAILY   sitaGLIPtin 100 MG tablet Commonly known as:  JANUVIA Take 1 tablet (100 mg total) by mouth daily.   SPIRIVA RESPIMAT 2.5 MCG/ACT Aers Generic drug:  Tiotropium Bromide Monohydrate Inhale 2 puffs into the lungs daily.   SYMBICORT 160-4.5 MCG/ACT inhaler Generic drug:  budesonide-formoterol Inhale 2 puffs into the lungs 2 (two) times daily.          Objective:   Physical Exam  BP 126/72 (BP Location: Left Arm, Patient Position: Sitting, Cuff Size: Small)   Pulse 72   Temp 98 F (36.7 C) (Oral)   Resp 14   Ht 5\' 4"  (1.626 m)   Wt 184 lb 4 oz (83.6 kg)   SpO2 97%   BMI 31.63 kg/m  General:   Well developed, well nourished . NAD.  HEENT:  Normocephalic . Face symmetric, atraumatic.  EOMI, pupils equal and reactive Neck: No TTP, full range of motion. Skin: Not pale. Not jaundice Neurologic:  alert & oriented X3.  Speech normal, gait appropriate for age and unassisted.  Motor and DTR symmetric Psych--  Cognition and judgment appear intact.  Cooperative with normal attention span and concentration.  Behavior appropriate. No anxious or depressed appearing.      Assessment & Plan:    Assessment DM  aic 6.2 (2009) Hyperlipidemia GERD, HH, Dr Ewing SchleinMagod  Asthma -- dr Madie RenoVanWinkle, on allergy shots Palpitations, PACs, sees cardiology, (-) stress test 2006 OSA (mild)  per sleep study 02-2016, Rx weight loss, dental appliance Recurrent cystitis, on poscoital abx, sx better after hysterectomy 1996, resurfaced 2011 Fibrocystic breast dz Lung nodule: Subtle pulmonary 10-2015, next CT 10-2016  PLAN: Subdural hematoma: Traumatic subdural hematoma, 3 mm, she is currently doing great and essentially back to  baseline. Literature suggest recheck a CT head to ensure stability, this was discussed with the patient, we both feel that she is doing very well and eventually decided not to pursue a CT but very close follow-up of her symptoms, she is very aware of red flag sxs. She was taking aspirin prior to the incident, has not take any since.  Recommend not to go back to aspirin at this point. Shoulder pain: Had a rotator cuff strain, was temporarily worse after the fall. RTC next month as planned.

## 2017-02-08 NOTE — Assessment & Plan Note (Signed)
Subdural hematoma: Traumatic subdural hematoma, 3 mm, she is currently doing great and essentially back to baseline. Literature suggest recheck a CT head to ensure stability, this was discussed with the patient, we both feel that she is doing very well and eventually decided not to pursue a CT but very close follow-up of her symptoms, she is very aware of red flag sxs. She was taking aspirin prior to the incident, has not take any since.  Recommend not to go back to aspirin at this point. Shoulder pain: Had a rotator cuff strain, was temporarily worse after the fall. RTC next month as planned.

## 2017-02-19 ENCOUNTER — Telehealth: Payer: Self-pay | Admitting: Neurology

## 2017-02-19 NOTE — Telephone Encounter (Signed)
I would check a follow-up CT scan and if the blood has become iso-dense and subdural hematoma is stable or regressing restart aspirin 81 mg typically in 2-4 weeks after the initial subdural. Typically such a small subdural is not of clinical significance

## 2017-02-19 NOTE — Telephone Encounter (Signed)
-----   Message from Jose E Paz, MD sent at 02/08/2017  3:31 PM EST ---Karen Barnes-- Regarding: ASA question Hello Karen Barnes, wonder if you could help me on this. My patient  developed a traumatic 3 mm subdural hematoma treated conservatively. Doing great. I'm not sure when/if  she could go back at some point to her aspirin which she takes due to family history of CAD. I hope you had a great holiday season! [facility] Jose

## 2017-02-19 NOTE — Telephone Encounter (Signed)
Karen Barnes, Karen E, MD  Karen Barnes, [name], MD  Cc: Karen Barnes, Karen E, MD        Hello Karen Barnes, wonder if you could help me on this.  My patient developed a traumatic 3 mm subdural hematoma treated conservatively. Doing great.  I'm not sure when/if she could go back at some point to her aspirin which she takes due to family history of CAD.  I hope you had a great holiday season!  [facility]  Karen      Dear Karen QuickJose,  I forwarded this question to Dr Roda ShuttersXU and Dr Pearlean BrownieSethi- Thank you for you well wishes- same to you !Judeth Horn.  [place] [facility] [place]  PS :  I was out of the office until  02-12-2017   Yours, CD

## 2017-02-19 NOTE — Telephone Encounter (Signed)
Thank you, Pramod.  Cc Dr Drue NovelPaz.

## 2017-02-20 NOTE — Telephone Encounter (Signed)
Spoke w/ Pt, informed of recommendations. Pt verbalized understanding.  

## 2017-02-20 NOTE — Telephone Encounter (Signed)
I appreciate neuro input. Karen Barnes, Please call pt, advise to f/u w/ me next month as schedule, will discussed then CT head, ASA etc

## 2017-03-01 ENCOUNTER — Encounter: Payer: Self-pay | Admitting: Internal Medicine

## 2017-03-01 ENCOUNTER — Telehealth: Payer: Self-pay | Admitting: Internal Medicine

## 2017-03-01 MED ORDER — FREESTYLE LIBRE 14 DAY SENSOR MISC: 1.0000 | 5 refills | Status: DC | PRN

## 2017-03-01 MED ORDER — FREESTYLE LIBRE 14 DAY READER DEVI: 1.0000 | Freq: Once | 0 refills | Status: DC

## 2017-03-01 NOTE — Telephone Encounter (Signed)
Copied from [place]. Topic: Quick Communication - Rx Refill/Question >> Mar 01, 2017 10:00 AM Marin Olp L wrote: Medication: Freestyle Libre sensor, reader, 14 day and not 10 day (freestyle [employer])   Has the patient contacted their pharmacy? Yes.     (Agent: If no, request that the patient contact the pharmacy for the refill.)   Preferred Pharmacy (with phone number or street name): [facility], [address] AT [address]   Agent: Please be advised that RX refills may take up to 3 business days. We ask that you follow-up with your pharmacy.

## 2017-03-01 NOTE — Telephone Encounter (Signed)
14 day sensor system sent to pharamcy.

## 2017-03-01 NOTE — Telephone Encounter (Signed)
Pt requesting a change in her Freestyle Libre sensor, reader for 14 days and not 10 days (freestyle [employer]).

## 2017-03-02 ENCOUNTER — Other Ambulatory Visit: Payer: Self-pay | Admitting: Obstetrics and Gynecology

## 2017-03-02 DIAGNOSIS — Z803 Family history of malignant neoplasm of breast: Secondary | ICD-10-CM

## 2017-03-08 ENCOUNTER — Other Ambulatory Visit: Payer: Self-pay | Admitting: Internal Medicine

## 2017-03-14 ENCOUNTER — Other Ambulatory Visit: Payer: BC Managed Care – PPO

## 2017-03-23 ENCOUNTER — Other Ambulatory Visit: Payer: Self-pay

## 2017-03-30 ENCOUNTER — Ambulatory Visit (INDEPENDENT_AMBULATORY_CARE_PROVIDER_SITE_OTHER): Payer: BC Managed Care – PPO | Admitting: Internal Medicine

## 2017-03-30 ENCOUNTER — Encounter: Payer: Self-pay | Admitting: Internal Medicine

## 2017-03-30 VITALS — BP 126/68 | HR 68 | Temp 98.0°F | Resp 14 | Ht 64.0 in | Wt 185.5 lb

## 2017-03-30 DIAGNOSIS — Z0000 Encounter for general adult medical examination without abnormal findings: Secondary | ICD-10-CM | POA: Diagnosis not present

## 2017-03-30 DIAGNOSIS — E119 Type 2 diabetes mellitus without complications: Secondary | ICD-10-CM | POA: Diagnosis not present

## 2017-03-30 DIAGNOSIS — S065X9A Traumatic subdural hemorrhage with loss of consciousness of unspecified duration, initial encounter: Secondary | ICD-10-CM

## 2017-03-30 DIAGNOSIS — S065XAA Traumatic subdural hemorrhage with loss of consciousness status unknown, initial encounter: Secondary | ICD-10-CM

## 2017-03-30 DIAGNOSIS — Z23 Encounter for immunization: Secondary | ICD-10-CM

## 2017-03-30 LAB — COMPREHENSIVE METABOLIC PANEL
ALK PHOS: 60 U/L (ref 39–117)
ALT: 12 U/L (ref 0–35)
AST: 14 U/L (ref 0–37)
Albumin: 4 g/dL (ref 3.5–5.2)
BILIRUBIN TOTAL: 0.4 mg/dL (ref 0.2–1.2)
BUN: 11 mg/dL (ref 6–23)
CO2: 29 mEq/L (ref 19–32)
Calcium: 9.5 mg/dL (ref 8.4–10.5)
Chloride: 103 mEq/L (ref 96–112)
Creatinine, Ser: 0.65 mg/dL (ref 0.40–1.20)
GFR: 99.34 mL/min (ref >60.00)
GLUCOSE: 121 mg/dL — AB (ref 70–99)
Potassium: 4.1 mEq/L (ref 3.5–5.1)
SODIUM: 140 meq/L (ref 135–145)
TOTAL PROTEIN: 7.2 g/dL (ref 6.0–8.3)

## 2017-03-30 LAB — MICROALBUMIN / CREATININE URINE RATIO
CREATININE, U: 50.7 mg/dL
Microalb Creat Ratio: 1.4 mg/g (ref 0.0–30.0)

## 2017-03-30 LAB — LIPID PANEL
Cholesterol: 147 mg/dL (ref 0–200)
HDL: 65.5 mg/dL (ref >39.00)
LDL Cholesterol: 68 mg/dL (ref 0–99)
NONHDL: 81.72
Total CHOL/HDL Ratio: 2
Triglycerides: 67 mg/dL (ref 0.0–149.0)
VLDL: 13.4 mg/dL (ref 0.0–40.0)

## 2017-03-30 LAB — TSH: TSH: 1.07 u[IU]/mL (ref 0.35–4.50)

## 2017-03-30 LAB — HEMOGLOBIN A1C: HEMOGLOBIN A1C: 6.4 % (ref 4.6–6.5)

## 2017-03-30 NOTE — Progress Notes (Signed)
Pre visit review using our clinic review tool, if applicable. No additional management support is needed unless otherwise documented below in the visit note. 

## 2017-03-30 NOTE — Patient Instructions (Signed)
GO TO THE LAB : Get the blood work     GO TO THE FRONT DESK Schedule your next appointment for a checkup in 6-8 months  If the  follow-up CT head looks okay, will restart aspirin 81  CARING FOR THE MEMORY IMPAIRED   The 36-Hour Day a book by Denver FasterNancy Mace and Theron Aristaeter Rabins is a detailed self-help guide for people caring for loved ones with Alzheimer's disease, dementia, and other memory impairments.  Web Resources : [employer]  [employer].isAlz.org  Local Resource: [facility] [place] [facility]

## 2017-03-30 NOTE — Assessment & Plan Note (Addendum)
-  Td 03-2017; shingrex not available; pnm 23 09-2014; prevnar due 09-2015 Had a flu shot  -[facility]: cscope ~ 05-17-10 Dr Harrison MonsMaggod, cscope again 01-2016, had hemorrhoids, next 10 years -Female care per   Dr Reece AgarG. Atkins -dexa neg 2015  - FH CAD -Genetic testing -rx per gyn showed increase risk of colon and breast cancer, she forwarded information to GI, gyn plans breast MRIs -Diet, exercise: busy taking care of her parents, not exercising as much, counseled about care taker burnout    Labs: CMP, FLP, A1c, TSH

## 2017-03-30 NOTE — Progress Notes (Signed)
Subjective:    Patient ID: Karen Barnes, female    DOB: 12/24/1960, 57 y.o.   MRN: [id number]  DOS:  03/30/2017 Type of visit - description : cpx Interval history: Here for CPX Had genetic testing done by gynecology, results reviewed today.   Review of Systems Had a subdural hematoma, she is recovering well, denies nausea, vomiting, headaches. Was exposed to the flu, for the last 3 days it is having mild chest congestion and postnasal dripping.  No malaise, fever or chills  Other than above, a 14 point review of systems is negative     Past Medical History:  Diagnosis Date  . Asthma, moderate persistent    sees Dr Irena Cords  . Diabetes mellitus    A1C 6.2---->  2009  . Family history of adverse reaction to anesthesia    sister had cardiac arrest with rhinoplasty  . Fibrocystic breast   . GERD (gastroesophageal reflux disease)   . HH (hiatus hernia) 09/2014   Dr. Ewing Schlein  . History of hiatal hernia   . Hot flashes   . Hyperlipidemia   . Normal nuclear stress test 2006  . Palpitations    PACs sees cards   . Recurrent cystitis    took  postcoital antibiotic for years, symptoms better after her hysterectomy 1996, symptoms re-surface in 2011  . RUQ abdominal pain 2016   EGD showed small HH, otherwise normal  . Wears glasses     Past Surgical History:  Procedure Laterality Date  . ABDOMINAL HYSTERECTOMY  1996   no oophorectomy, d/tendometriosis and fibroids approximately 1996  . BREAST EXCISIONAL BIOPSY Right 03/12/2014   [facility]  . BREAST LUMPECTOMY WITH RADIOACTIVE SEED LOCALIZATION Right 03/12/2014   Procedure: BREAST LUMPECTOMY WITH RADIOACTIVE SEED LOCALIZATION;  Surgeon: Harriette Bouillon, MD;  Location: [facility];  Service: General;  Laterality: Right;  . BREAST SURGERY     Lumpectomy-- BENIGN   . CATARACT EXTRACTION Bilateral 2011  . CESAREAN SECTION    . COSMETIC SURGERY  01/2017   MonaLisa Touch w/ GYN  . DILATION AND CURETTAGE OF UTERUS    .  LIPOMA EXCISION  1978  . UPPER GASTROINTESTINAL ENDOSCOPY    . UPPER GI ENDOSCOPY  09/11/2014   Small Hiatus Hernia, Dr. Ewing Schlein    Social History   Socioeconomic History  . Marital status: Married    Spouse name: Not on file  . Number of children: 1  . Years of education: College  . Highest education level: Not on file  Social Needs  . Financial resource strain: Not on file  . Food insecurity - worry: Not on file  . Food insecurity - inability: Not on file  . Transportation needs - medical: Not on file  . Transportation needs - non-medical: Not on file  Occupational History  . Occupation: retired principal  Tobacco Use  . Smoking status: Never Smoker  . Smokeless tobacco: Never Used  Substance and Sexual Activity  . Alcohol use: No  . Drug use: No  . Sexual activity: Yes  Other Topics Concern  . Not on file  Social History Narrative   Household-- pt and husband    Rare caffeine use   Daughter independent, 1 G-child   Engineer, structural for F and M, both w/ dementia     Family History  Problem Relation Age of Onset  . Mitral valve prolapse Mother   . Breast cancer Mother 3       M had ca insitu (  age 33), aunt   . Dementia Mother   . Heart disease Father 28       early   . Diabetes Father   . Kidney failure Father   . Sleep apnea Father   . Brain cancer Other        2 fam members   . Lung cancer Other        uncle, heavy smoker  . Breast cancer Maternal Aunt 60  . CAD Paternal Grandmother 76  . CAD Paternal Grandfather 6  . Colon cancer Neg Hx      Allergies as of 03/30/2017      Reactions   Butorphanol Tartrate Other (See Comments)   REACTION: hallucinations   Hydrocodone Other (See Comments)   HALLUCINATIONS   Roxicet [oxycodone-acetaminophen] Other (See Comments)   HALLUCINATIONS   Penicillins Hives   Occurred at age 44/CHILDHOOD ALLERGY Has patient had a PCN reaction causing immediate rash, facial/tongue/throat swelling, SOB or lightheadedness with  hypotension: Unknown Has patient had a PCN reaction causing severe rash involving mucus membranes or skin necrosis: Unknown Has patient had a PCN reaction that required hospitalization: Unknown Has patient had a PCN reaction occurring within the last 10 years: No If all of the above answers are "NO", then may proceed with Cephalosporin use.   Zofran [ondansetron Hcl] Other (See Comments)   Hair loss      Medication List        Accurate as of 03/30/17 11:59 PM. Always use your most recent med list.          albuterol 108 (90 Base) MCG/ACT inhaler Commonly known as:  PROVENTIL HFA;VENTOLIN HFA Inhale 2 puffs into the lungs every 6 (six) hours as needed.   AUVI-Q 0.3 mg/0.3 mL Soaj injection Generic drug:  EPINEPHrine Inject 0.3 mLs (0.3 mg total) into the muscle once for 1 dose.   bifidobacterium infantis capsule Take 1 capsule by mouth daily.   CALCIUM 600+D 600-200 MG-UNIT Tabs Generic drug:  Calcium Carbonate-Vitamin D Take 1 tablet by mouth daily.   diclofenac sodium 1 % Gel Commonly known as:  VOLTAREN Apply 2 g topically 4 (four) times daily.   diltiazem 120 MG 24 hr capsule Commonly known as:  CARTIA XT Take 1 capsule (120 mg total) daily by mouth.   fexofenadine 180 MG tablet Commonly known as:  ALLEGRA Take 180 mg by mouth every morning.   fluticasone 50 MCG/ACT nasal spray Commonly known as:  FLONASE Place 2 sprays into the nose daily. AND IF NEEDED 2 SPRAYS AT NIGHT   FREESTYLE LIBRE SENSOR SYSTEM Misc CHECK BLOOD SUGAR DAILY AS DIRECTED. CHANGE DEVICE EVERY 10 DAYS   FREESTYLE LIBRE 14 DAY SENSOR Misc 1 Container by Does not apply route as needed.   hydrocortisone 2.5 % cream APPLY EXTERNALLY TO THE AFFECTED AREA TWICE DAILY AS NEEDED   metFORMIN 1000 MG tablet Commonly known as:  GLUCOPHAGE TAKE 1&1/2 TABLETS BY MOUTH EVERY MORNING AND 1 TABLET EVERY EVENING   montelukast 10 MG tablet Commonly known as:  SINGULAIR Take 10 mg by mouth at  bedtime.   multivitamin with minerals tablet Take 1 tablet by mouth daily.   nitrofurantoin (macrocrystal-monohydrate) 100 MG capsule Commonly known as:  MACROBID TAKE AS DIRECTED POST COITAL   omeprazole 40 MG capsule Commonly known as:  PRILOSEC Take 1 capsule (40 mg total) by mouth 2 (two) times daily.   PRESCRIPTION MEDICATION Allergy Shots   rosuvastatin 5 MG tablet Commonly known as:  CRESTOR  TAKE 1 TABLET(5 MG) BY MOUTH DAILY   sitaGLIPtin 100 MG tablet Commonly known as:  JANUVIA Take 1 tablet (100 mg total) by mouth daily.   SPIRIVA RESPIMAT 2.5 MCG/ACT Aers Generic drug:  Tiotropium Bromide Monohydrate Inhale 2 puffs into the lungs daily.   SYMBICORT 160-4.5 MCG/ACT inhaler Generic drug:  budesonide-formoterol Inhale 2 puffs into the lungs 2 (two) times daily.          Objective:   Physical Exam BP 126/68 (BP Location: Left Arm, Patient Position: Sitting, Cuff Size: Normal)   Pulse 68   Temp 98 F (36.7 C) (Oral)   Resp 14   Ht 5\' 4"  (1.626 m)   Wt 185 lb 8 oz (84.1 kg)   SpO2 98%   BMI 31.84 kg/m   General:   Well developed, well nourished . NAD.  Neck: No  thyromegaly.  Range of motion normal HEENT:  Normocephalic . Face symmetric, atraumatic.  Nose is slightly congested Lungs:  CTA B Normal respiratory effort, no intercostal retractions, no accessory muscle use. Heart: RRR,  no murmur.  No pretibial edema bilaterally  Abdomen:  Not distended, soft, non-tender. No rebound or rigidity.   Skin: Exposed areas without rash. Not pale. Not jaundice Neurologic:  alert & oriented X3.  Speech normal, gait appropriate for age and unassisted Strength symmetric and appropriate for age.  Psych: Cognition and judgment appear intact.  Cooperative with normal attention span and concentration.  Behavior appropriate. No anxious or depressed appearing.     Assessment & Plan:   Assessment DM  aic 6.2 (2009) Hyperlipidemia ++FH CAD + Genetic  testing, increased risk of colon and breast cancer OSA (mild)  per sleep study 02-2016, Rx weight loss, dental appliance GERD, HH, Dr Ewing SchleinMagod  Asthma -- dr Madie RenoVanWinkle, on allergy shots Palpitations, PACs, sees cardiology, (-) stress test 2006 Recurrent cystitis, on poscoital abx, sx better after hysterectomy 1996, resurfaced 2011 Fibrocystic breast dz Lung nodule: Subtle pulmonary 10-2015, next CT 10-2016 Subdural hematoma, 3 mm, seen @  [facility] 02/01/2017, per Neurosurgery : d/c  home with symptomatic follow-up.  PLAN: DM: Continue metformin, Januvia, check a A1c Subdural hematoma: Recovering well, asx, case was discussed with neuro few weeks ago, they rec a f/u CT scan and if the blood has become iso-dense and subdural hematoma is stable or regressing then ok to restart aspirin which she needs due to her multiple CV RF. Patient in agreement.  Going forward, typically such a small subdural is not of clinical significance Asthma: seems to be well controlled, having URI symptoms for 3 days without evidence of the flu, recommend OTCs, rescue inhalers if needed. RTC 6-8 months

## 2017-03-31 NOTE — Assessment & Plan Note (Signed)
DM: Continue metformin, Januvia, check a A1c Subdural hematoma: Recovering well, asx, case was discussed with neuro few weeks ago, they rec a f/u CT scan and if the blood has become iso-dense and subdural hematoma is stable or regressing then ok to restart aspirin which she needs due to her multiple CV RF. Patient in agreement.  Going forward, typically such a small subdural is not of clinical significance Asthma: seems to be well controlled, having URI symptoms for 3 days without evidence of the flu, recommend OTCs, rescue inhalers if needed. RTC 6-8 months

## 2017-04-06 ENCOUNTER — Ambulatory Visit (INDEPENDENT_AMBULATORY_CARE_PROVIDER_SITE_OTHER): Payer: BC Managed Care – PPO

## 2017-04-06 DIAGNOSIS — S065X9D Traumatic subdural hemorrhage with loss of consciousness of unspecified duration, subsequent encounter: Secondary | ICD-10-CM

## 2017-04-06 DIAGNOSIS — W19XXXD Unspecified fall, subsequent encounter: Secondary | ICD-10-CM | POA: Diagnosis not present

## 2017-04-18 ENCOUNTER — Telehealth: Payer: Self-pay | Admitting: Cardiology

## 2017-04-18 NOTE — Telephone Encounter (Signed)
Left a message for the pt to call back.  Held a slot for her on Vin Bhagat's PA-C schedule for her at 0800 on 04/19/17.

## 2017-04-18 NOTE — Telephone Encounter (Signed)
Pt calling in with complaints of palpitations for 3 hours, last night while trying to sleep.  Pt states in the middle of the night, she began to experience palpitations and "irregular heartbeat," for about 3 hours, then it stopped.  Pt states she did not have CP, SOB, DOE, dizziness, pre-syncopal, or syncopal episodes. Pt states she just felt the irregularity and felt very fatigue during the course of her palpitations. Pt states that she is being compliant with taking all meds prescribed.  Pt states she has a history of PACs, which have been controlled with Diltiazem. Pt states she is currently under an incredible amount of stress, for her father is currently admitted to the hospital, and she provides total care for her Mother. Pt states she is having no palpitations or other cardiac issues at this current time. Pt would like an appt for sometime this week, to be further evaluated.  Pt is concerned, because both of her parents have an extensive cardiac history, and known AFIB.  Scheduled the pt to see Vin Bhagat PA-C for tomorrow 3/14 at 0800.  Pt aware to arrive 15 mins prior to this appt.  Advised the pt that in the meantime, she should continue her current med regimen, avoid caffeine and sodium, stay plenty hydrated, and refer to the ER if she becomes acutely distressed.  Informed the pt that Dr Delton SeeNelson is out of the office this week, but I will still route this message to her for further review, and to endorse scheduled appt with Vin Bhagat PA-C on 3/14.  Pt verbalized understanding and agrees with this plan.  Pt more than gracious for all the assistance provided.

## 2017-04-18 NOTE — Progress Notes (Signed)
Cardiology Office Note    Date:  04/19/2017   ID:  Karen Barnes, DOB 04-20-1960, MRN [id number]  PCP:  Wanda Plump, MD  Cardiologist:  Dr. Delton See   Chief Complaint: Palpitations   History of Present Illness:   Karen Barnes is a 57 y.o. female with hx of subdural hematoma, DM 2, hypertension, HLD and symptomatic palpitations  presents for palpitations.   She has a strong family history of premature CAD. She had a normal stress test in 2006. Her father had an MI at 53 and later CABG. Her mother had atrial fibrillation. 2 grandparents with presumed SCD. Uncles with MIs in their 84s.  In 2017, she has had significant problems with her asthma requiring steroids and multiple inhalers. Her beta blockers were changed to Cardizem that she is tolerating very well and in fact she feels like they are controlling her PVCs much better. She wad doing well on cardiac stand point when last seen by Dr. Delton See 12/2016.  She had mechanical fall off a stool 02/01/17. Found to have Traumatic subdural hematoma, 3 mm. Discontinue ASA 81mg  qd. Seen by PCP 03/30/17 and restarted aspirin after reassuring CT of head.   The patient is here for evaluation of palpitation.  Patient is under a lot of stress recently taking care of demented mother and father who had a recent bilateral below-knee amputation.  Tuesday, patient had argument with father and was really upset and stressed out.  Patient had a worse episode of palpitations at night which lasted for almost 3 hours.  No associated chest pain, shortness of breath or dizziness. Her episode was different than typical PAC.  She felt that she was in atrial fibrillation.  Yesterday patient felt severely fatigued and worn out.  No recurrent palpitation.  Denies orthopnea, PND, syncope, lower extremity edema or melena.  Past Medical History:  Diagnosis Date  . Asthma, moderate persistent    sees Dr Irena Cords  . Diabetes mellitus    A1C 6.2---->  2009  . Family  history of adverse reaction to anesthesia    sister had cardiac arrest with rhinoplasty  . Fibrocystic breast   . GERD (gastroesophageal reflux disease)   . HH (hiatus hernia) 09/2014   Dr. Ewing Schlein  . History of hiatal hernia   . Hot flashes   . Hyperlipidemia   . Normal nuclear stress test 2006  . Palpitations    PACs sees cards   . Recurrent cystitis    took  postcoital antibiotic for years, symptoms better after her hysterectomy 1996, symptoms re-surface in 2011  . RUQ abdominal pain 2016   EGD showed small HH, otherwise normal  . Wears glasses     Past Surgical History:  Procedure Laterality Date  . ABDOMINAL HYSTERECTOMY  1996   no oophorectomy, d/tendometriosis and fibroids approximately 1996  . BREAST EXCISIONAL BIOPSY Right 03/12/2014   [facility]  . BREAST LUMPECTOMY WITH RADIOACTIVE SEED LOCALIZATION Right 03/12/2014   Procedure: BREAST LUMPECTOMY WITH RADIOACTIVE SEED LOCALIZATION;  Surgeon: Harriette Bouillon, MD;  Location:  [facility];  Service: General;  Laterality: Right;  . BREAST SURGERY     Lumpectomy-- BENIGN   . CATARACT EXTRACTION Bilateral 2011  . CESAREAN SECTION    . COSMETIC SURGERY  01/2017   [facility] Touch w/ GYN  . DILATION AND CURETTAGE OF UTERUS    . LIPOMA EXCISION  1978  . UPPER GASTROINTESTINAL ENDOSCOPY    . UPPER GI ENDOSCOPY  09/11/2014  Small Hiatus Hernia, Dr. Ewing Schlein    Current Medications: Prior to Admission medications   Medication Sig Start Date End Date Taking? Authorizing Provider  albuterol (PROVENTIL HFA;VENTOLIN HFA) 108 (90 BASE) MCG/ACT inhaler Inhale 2 puffs into the lungs every 6 (six) hours as needed.    [provider]  bifidobacterium infantis (ALIGN) capsule Take 1 capsule by mouth daily.    [provider]  budesonide-formoterol (SYMBICORT) 160-4.5 MCG/ACT inhaler Inhale 2 puffs into the lungs 2 (two) times daily. 09/14/15   [provider]  Calcium Carbonate-Vitamin D (CALCIUM 600+D) 600-200  MG-UNIT TABS Take 1 tablet by mouth daily.    [provider]  Continuous Blood Gluc Sensor (FREESTYLE LIBRE 14 DAY SENSOR) MISC 1 Container by Does not apply route as needed. 03/01/17   Wanda Plump, MD  Continuous Blood Gluc Sensor (FREESTYLE LIBRE SENSOR SYSTEM) MISC CHECK BLOOD SUGAR DAILY AS DIRECTED. CHANGE DEVICE EVERY 10 DAYS 09/29/16   Wanda Plump, MD  diclofenac sodium (VOLTAREN) 1 % GEL Apply 2 g topically 4 (four) times daily. 11/02/16   Wanda Plump, MD  diltiazem (CARTIA XT) 120 MG 24 hr capsule Take 1 capsule (120 mg total) daily by mouth. 12/22/16   Lars Masson, MD  EPINEPHrine (AUVI-Q) 0.3 mg/0.3 mL IJ SOAJ injection Inject 0.3 mLs (0.3 mg total) into the muscle once for 1 dose. 03/23/17   Wanda Plump, MD  fexofenadine (ALLEGRA) 180 MG tablet Take 180 mg by mouth every morning.     [provider]  fluticasone (FLONASE) 50 MCG/ACT nasal spray Place 2 sprays into the nose daily. AND IF NEEDED 2 SPRAYS AT NIGHT    [provider]  hydrocortisone 2.5 % cream APPLY EXTERNALLY TO THE AFFECTED AREA TWICE DAILY AS NEEDED 01/28/16   Wanda Plump, MD  metFORMIN (GLUCOPHAGE) 1000 MG tablet TAKE 1&1/2 TABLETS BY MOUTH EVERY MORNING AND 1 TABLET EVERY EVENING 03/08/17   Paz, Nolon Rod, MD  montelukast (SINGULAIR) 10 MG tablet Take 10 mg by mouth at bedtime.      [provider]  Multiple Vitamins-Minerals (MULTIVITAMIN WITH MINERALS) tablet Take 1 tablet by mouth daily.    [provider]  nitrofurantoin, macrocrystal-monohydrate, (MACROBID) 100 MG capsule TAKE AS DIRECTED POST COITAL Patient not taking: Reported on 02/08/2017 01/28/16   Wanda Plump, MD  omeprazole (PRILOSEC) 40 MG capsule Take 1 capsule (40 mg total) by mouth 2 (two) times daily. 06/24/15   Wanda Plump, MD  PRESCRIPTION MEDICATION Allergy Shots    [provider]  rosuvastatin (CRESTOR) 5 MG tablet TAKE 1 TABLET(5 MG) BY MOUTH DAILY 12/22/16   Lars Masson, MD  sitaGLIPtin  (JANUVIA) 100 MG tablet Take 1 tablet (100 mg total) by mouth daily. 10/16/16   Wanda Plump, MD  Tiotropium Bromide Monohydrate (SPIRIVA RESPIMAT) 2.5 MCG/ACT AERS Inhale 2 puffs into the lungs daily.    [provider]    Allergies:   Butorphanol tartrate; Hydrocodone; Roxicet [oxycodone-acetaminophen]; Penicillins; and Zofran [ondansetron hcl]   Social History   Socioeconomic History  . Marital status: Married    Spouse name: None  . Number of children: 1  . Years of education: College  . Highest education level: None  Social Needs  . Financial resource strain: None  . Food insecurity - worry: None  . Food insecurity - inability: None  . Transportation needs - medical: None  . Transportation needs - non-medical: None  Occupational History  .  Occupation: retired principal  Tobacco Use  . Smoking status: Never Smoker  . Smokeless tobacco: Never Used  Substance and Sexual Activity  . Alcohol use: No  . Drug use: No  . Sexual activity: Yes  Other Topics Concern  . None  Social History Narrative   Household-- pt and husband    Rare caffeine use   Daughter independent, 1 G-child   Engineer, structural for F and M, both w/ dementia     Family History:  The patient's family history includes Brain cancer in her other; Breast cancer (age of onset: 56) in her maternal aunt; Breast cancer (age of onset: 32) in her mother; CAD (age of onset: 14) in her paternal grandfather and paternal grandmother; Dementia in her mother; Diabetes in her father; Heart disease (age of onset: 30) in her father; Kidney failure in her father; Lung cancer in her other; Mitral valve prolapse in her mother; Sleep apnea in her father.   ROS:   Please see the history of present illness.    ROS All other systems reviewed and are negative.   PHYSICAL EXAM:   VS:  BP 124/74   Pulse 84   Ht 5\' 4"  (1.626 m)   Wt 185 lb (83.9 kg)   SpO2 98%   BMI 31.76 kg/m    GEN: Well nourished, well developed, in no acute  distress  HEENT: normal  Neck: no JVD, carotid bruits, or masses Cardiac: RRR; no murmurs, rubs, or gallops,no edema  Respiratory:  clear to auscultation bilaterally, normal work of breathing GI: soft, nontender, nondistended, + BS MS: no deformity or atrophy  Skin: warm and dry, no rash Neuro:  Alert and Oriented x 3, Strength and sensation are intact Psych: euthymic mood, full affect  Wt Readings from Last 3 Encounters:  04/19/17 185 lb (83.9 kg)  03/30/17 185 lb 8 oz (84.1 kg)  02/08/17 184 lb 4 oz (83.6 kg)      Studies/Labs Reviewed:   EKG:  EKG is ordered today.  The ekg ordered today demonstrates sinus rhythm at rate of 80 bpm  Recent Labs: 09/26/2016: Hemoglobin 12.1; Platelets 272.0 03/30/2017: ALT 12; BUN 11; Creatinine, Ser 0.65; Potassium 4.1; Sodium 140; TSH 1.07   Lipid Panel    Component Value Date/Time   CHOL 147 03/30/2017 0938   TRIG 67.0 03/30/2017 0938   HDL 65.50 03/30/2017 0938   CHOLHDL 2 03/30/2017 0938   VLDL 13.4 03/30/2017 0938   LDLCALC 68 03/30/2017 0938    Additional studies/ records that were reviewed today include:   48 hour holter 01/2015 PACs, short runs of possible atrial tachycardia.  No worrisome arrhythmias.   ASSESSMENT & PLAN:    1. Palpitations -Episode 2 days ago was different than typical PAC.  It was her worst episode lasting for 3 hours.  She felt she was in atrial fibrillation.  Patient has a strong family history of atrial fibrillation to her both parents.  Yesterday she was severely fatigued and tired.  No recurrent palpitation.  Likely exacerbated by stress.  EKG deviation today. -We will give as needed short-acting Cardizem.  30-day event monitor.  Advised to go to ER or call EMS if similar episode while not on monitor.  2. HTN -Stable on current medication.   3. HLD -Continue statin.  Managed by PCP.    Medication Adjustments/Labs and Tests Ordered: Current medicines are reviewed at length with the patient  today.  Concerns regarding medicines are outlined above.  Medication changes, Labs  and Tests ordered today are listed in the Patient Instructions below. Patient Instructions  Medication Instructions: Your physician has recommended you make the following change in your medication:  -1) START Cardizem (Diltiazem) 30 mg - Take 1 tablet every 6 hours as needed for palpitations and increased heart rate  Labwork: None Ordered  Procedures/Testing: Your physician has recommended that you wear a 30 day event monitor. Event monitors are medical devices that record the heart's electrical activity. Doctors most often us these monitors to diagnose arrhythmias. Arrhythmias are problems with the speed or rhythm of the heartbeat. The monitor is a small, portable device. You can wear one while you do your normal daily activities. This is usually used to diagnose what is causing palpitations/syncope (passing out).  Follow-Up: Your physician recommends that you keep your scheduled follow-up appointment with Dr. Delton SeeNelson in November     If you need a refill on your cardiac medications before your next appointment, please call your pharmacy.      Vonzella NippleSigned, Nickalous Stingley [place] Washington[place]hag[place], [place]  04/19/2017 8:22 AM    [facility] [address], [place], [place]  [address] Phone: [phone]) [phone]; Fax: [phone]

## 2017-04-18 NOTE — Telephone Encounter (Signed)
New Message   Patient c/o Palpitations:  High priority if patient c/o lightheadedness, shortness of breath, or chest pain  1) How long have you had palpitations/irregular HR/ Afib? Are you having the symptoms now? Last night had afib for about 3 hours, but none now  2) Are you currently experiencing lightheadedness, SOB or CP? none  3) Do you have a history of afib (atrial fibrillation) or irregular heart rhythm? yes  4) Have you checked your BP or HR? (document readings if available): no   5) Are you experiencing any other symptoms?fatigue    Please call to discuss.

## 2017-04-19 ENCOUNTER — Encounter: Payer: Self-pay | Admitting: Physician Assistant

## 2017-04-19 ENCOUNTER — Ambulatory Visit: Payer: BC Managed Care – PPO | Admitting: Physician Assistant

## 2017-04-19 VITALS — BP 124/74 | HR 84 | Ht 64.0 in | Wt 185.0 lb

## 2017-04-19 DIAGNOSIS — E785 Hyperlipidemia, unspecified: Secondary | ICD-10-CM | POA: Diagnosis not present

## 2017-04-19 DIAGNOSIS — R002 Palpitations: Secondary | ICD-10-CM | POA: Diagnosis not present

## 2017-04-19 DIAGNOSIS — I10 Essential (primary) hypertension: Secondary | ICD-10-CM | POA: Diagnosis not present

## 2017-04-19 MED ORDER — DILTIAZEM HCL 30 MG PO TABS: 30.0000 mg | ORAL_TABLET | Freq: Four times a day (QID) | ORAL | 1 refills | Status: DC | PRN

## 2017-04-19 NOTE — Patient Instructions (Addendum)
Medication Instructions: Your physician has recommended you make the following change in your medication:  -1) START Cardizem (Diltiazem) 30 mg - Take 1 tablet every 6 hours as needed for palpitations and increased heart rate  Labwork: None Ordered  Procedures/Testing: Your physician has recommended that you wear a 30 day event monitor. Event monitors are medical devices that record the heart's electrical activity. Doctors most often [place] these monitors to diagnose arrhythmias. Arrhythmias are problems with the speed or rhythm of the heartbeat. The monitor is a small, portable device. You can wear one while you do your normal daily activities. This is usually used to diagnose what is causing palpitations/syncope (passing out).  Follow-Up: Your physician recommends that you keep your scheduled follow-up appointment with Dr. Delton See in November     If you need a refill on your cardiac medications before your next appointment, please call your pharmacy.

## 2017-05-03 ENCOUNTER — Ambulatory Visit (INDEPENDENT_AMBULATORY_CARE_PROVIDER_SITE_OTHER): Payer: BC Managed Care – PPO

## 2017-05-03 DIAGNOSIS — R002 Palpitations: Secondary | ICD-10-CM

## 2017-05-28 ENCOUNTER — Other Ambulatory Visit: Payer: Self-pay | Admitting: Internal Medicine

## 2017-06-06 ENCOUNTER — Encounter: Payer: Self-pay | Admitting: Physician Assistant

## 2017-06-06 ENCOUNTER — Telehealth: Payer: Self-pay | Admitting: Cardiology

## 2017-06-06 ENCOUNTER — Ambulatory Visit: Payer: BC Managed Care – PPO | Admitting: Physician Assistant

## 2017-06-06 ENCOUNTER — Encounter: Payer: Self-pay | Admitting: *Deleted

## 2017-06-06 VITALS — BP 130/82 | HR 84 | Ht 64.0 in | Wt 187.0 lb

## 2017-06-06 DIAGNOSIS — R0602 Shortness of breath: Secondary | ICD-10-CM | POA: Diagnosis not present

## 2017-06-06 DIAGNOSIS — R079 Chest pain, unspecified: Secondary | ICD-10-CM

## 2017-06-06 DIAGNOSIS — I10 Essential (primary) hypertension: Secondary | ICD-10-CM | POA: Diagnosis not present

## 2017-06-06 DIAGNOSIS — I471 Supraventricular tachycardia: Secondary | ICD-10-CM | POA: Diagnosis not present

## 2017-06-06 MED ORDER — DILTIAZEM HCL ER COATED BEADS 120 MG PO CP24: 120.0000 mg | ORAL_CAPSULE | Freq: Two times a day (BID) | ORAL | 3 refills | Status: DC

## 2017-06-06 NOTE — Telephone Encounter (Signed)
Pt is calling in to obtain an appt for today, for complaints of racing heart last night, and thinking she is back in afib.  Pt states that mid-morning last night, while trying to sleep, she felt her heart racing, and felt mild Sob.  Pt states she took her PRN diltiazem that was prescribed to her for this, and it eased it off a bit.  Pt states she has felt some improvement, but still feels "irregular heartbeat." Pt states she feels super fatigued this morning. Pt also states she feels mild chest pressure this morning as well. Pt states on her apple watch her HR is irregular ranging in the 80s-130s.  Pt has no complaints of dizziness, DOE, pre-syncopal or syncopal episodes.  Pt did have a recent event monitor placed and the results have not processed through [employer], due to IT issues.  Was able to print off the pts event monitor reports that were episodic, through [facility] computer system. Management is aware of this and is currently working on this issue. Scheduled the pt to see Tereso Newcomer PA-C for today 5/1 at 1145.  Pt aware to arrive at 1130.  Advised the pt to have a driver, and she states she will have her spouse bring her.  Advised the pt that between now and her appt, if her symptoms worsen, then she should refer to the ER.  Pt states she just administered another diltiazem, for she was due for it.  Pt verbalized understanding and agrees with this plan.  Will send this message to Dr Delton See as an Lorain Childes.

## 2017-06-06 NOTE — Telephone Encounter (Signed)
Pt is calling  Stating she is may have had an afib eposide last night. Pt stated she is very tired and would like to speak to nurse to come in today for visit. Please advise pt.

## 2017-06-06 NOTE — Progress Notes (Signed)
Cardiology Office Note:    Date:  06/06/2017   ID:  Karen Barnes, DOB 11-11-1960, MRN [id number]  PCP:  Wanda Plump, MD  Cardiologist:  Tobias Alexander, MD   Referring MD: Wanda Plump, MD   Chief Complaint  Patient presents with  . Chest Pain  . Palpitations    History of Present Illness:    Karen Barnes is a 57 y.o. female with diabetes, hypertension, hyperlipidemia, palpitations, prior subdural hematoma (2/2 to a mechanical fall in 01/2017), asthma, strong family hx of CAD and AFib.  She has not been able to tolerate beta-blockers in the past due to asthma, but her PVCs have been controlled on Diltiazem.  She was last seen by [name], PA-C 04/19/17 for worsening palpitations that were somewhat prolonged.  An event monitor was arranged.    Karen Barnes returns for the evaluation of palpitations, chest pain.  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  He is also a patient in our practice.  Unfortunately, his dementia has progressed and he developed osteomyelitis.  He is currently status post bilateral BKA and is now living at [facility].  Karen Barnes has been under a lot of stress recently with taking care of her father as well as her mother, who has advanced Lewy body dementia.  Her and her husband have become the primary caregivers for her parents.  She has had issues with her asthma.  She recently was placed on prednisone and had her inhalers adjusted.  She has noted some chest discomfort described as pressure.  She notes this with emotional stress as well as with activity.  She also notes dyspnea with certain activities.  She denies orthopnea or PND.  She has mild pedal edema.  She denies syncope but has been landed at times.  Last night, she noted racing heart rate and took as needed diltiazem.  She recorded her heart rate at 170 this morning with her apple watch.  Her symptoms feel different than her previous  symptoms with PACs.  Prior CV studies:   The following studies were reviewed today:  Event Monitor 04/2017 Pending   Holter 01/28/15 PACs, short runs of possible atrial tachycardia.  No worrisome arrhythmias.   Past Medical History:  Diagnosis Date  . Asthma, moderate persistent    sees Dr Irena Cords  . Diabetes mellitus    A1C 6.2---->  2009  . Family history of adverse reaction to anesthesia    sister had cardiac arrest with rhinoplasty  . Fibrocystic breast   . GERD (gastroesophageal reflux disease)   . HH (hiatus hernia) 09/2014   Dr. Ewing Schlein  . History of hiatal hernia   . Hot flashes   . Hyperlipidemia   . Normal nuclear stress test 2006  . Palpitations    PACs sees cards   . Recurrent cystitis    took  postcoital antibiotic for years, symptoms better after her hysterectomy 1996, symptoms re-surface in 2011  . RUQ abdominal pain 2016   EGD showed small HH, otherwise normal  . Wears glasses    Surgical Hx: The patient  has a past surgical history that includes Cataract extraction (Bilateral, 2011); Cesarean section; Abdominal hysterectomy (1996); Dilation and curettage of uterus; Lipoma excision (1978); Upper gastrointestinal endoscopy; Breast lumpectomy with radioactive seed localization (Right, 03/12/2014); Breast surgery; Upper gi endoscopy (09/11/2014); Breast excisional biopsy (Right, 03/12/2014); and Cosmetic surgery (01/2017).   Current  Medications: Current Meds  Medication Sig  . albuterol (PROVENTIL HFA;VENTOLIN HFA) 108 (90 BASE) MCG/ACT inhaler Inhale 2 puffs into the lungs every 6 (six) hours as needed.  . bifidobacterium infantis (ALIGN) capsule Take 1 capsule by mouth daily.  . budesonide-formoterol (SYMBICORT) 160-4.5 MCG/ACT inhaler Inhale 2 puffs into the lungs 2 (two) times daily.  . Calcium Carbonate-Vitamin D (CALCIUM 600+D) 600-200 MG-UNIT TABS Take 1 tablet by mouth daily.  . Continuous Blood Gluc Receiver (FREESTYLE LIBRE 14 DAY READER) DEVI USE AS  DIRECTED  . Continuous Blood Gluc Sensor (FREESTYLE LIBRE 14 DAY SENSOR) MISC 1 Container by Does not apply route as needed.  . Continuous Blood Gluc Sensor (FREESTYLE LIBRE SENSOR SYSTEM) MISC CHECK BLOOD SUGAR DAILY AS DIRECTED. CHANGE DEVICE EVERY 10 DAYS  . diclofenac sodium (VOLTAREN) 1 % GEL Apply 2 g topically 4 (four) times daily.  Marland Kitchen diltiazem (CARDIZEM) 30 MG tablet Take 1 tablet (30 mg total) by mouth every 6 (six) hours as needed (for palpitations).  . EPINEPHrine (AUVI-Q) 0.3 mg/0.3 mL IJ SOAJ injection Inject 0.3 mg into the muscle as directed.  . fexofenadine (ALLEGRA) 180 MG tablet Take 180 mg by mouth every morning.   . fluticasone (FLONASE) 50 MCG/ACT nasal spray Place 2 sprays into the nose daily. AND IF NEEDED 2 SPRAYS AT NIGHT  . hydrocortisone 2.5 % cream APPLY EXTERNALLY TO THE AFFECTED AREA TWICE DAILY AS NEEDED  . metFORMIN (GLUCOPHAGE) 1000 MG tablet TAKE 1&1/2 TABLETS BY MOUTH EVERY MORNING AND 1 TABLET EVERY EVENING  . montelukast (SINGULAIR) 10 MG tablet Take 10 mg by mouth at bedtime.    . Multiple Vitamins-Minerals (MULTIVITAMIN WITH MINERALS) tablet Take 1 tablet by mouth daily.  . nitrofurantoin, macrocrystal-monohydrate, (MACROBID) 100 MG capsule TAKE AS DIRECTED POST COITAL  . omeprazole (PRILOSEC) 40 MG capsule Take 1 capsule (40 mg total) by mouth 2 (two) times daily.  Marland Kitchen PRESCRIPTION MEDICATION Allergy Shots  . rosuvastatin (CRESTOR) 5 MG tablet TAKE 1 TABLET(5 MG) BY MOUTH DAILY  . sitaGLIPtin (JANUVIA) 100 MG tablet Take 1 tablet (100 mg total) by mouth daily.  . Tiotropium Bromide Monohydrate (SPIRIVA RESPIMAT) 2.5 MCG/ACT AERS Inhale 2 puffs into the lungs daily.  . [DISCONTINUED] diltiazem (CARTIA XT) 120 MG 24 hr capsule Take 1 capsule (120 mg total) daily by mouth.  . [DISCONTINUED] EPINEPHrine (AUVI-Q) 0.3 mg/0.3 mL IJ SOAJ injection Inject 0.3 mLs (0.3 mg total) into the muscle once for 1 dose.     Allergies:   Butorphanol tartrate; Hydrocodone;  Roxicet [oxycodone-acetaminophen]; Penicillins; and Zofran [ondansetron hcl]   Social History   Tobacco Use  . Smoking status: Never Smoker  . Smokeless tobacco: Never Used  Substance Use Topics  . Alcohol use: No  . Drug use: No     Family Hx: The patient's family history includes Brain cancer in her other; Breast cancer (age of onset: 33) in her maternal aunt; Breast cancer (age of onset: 67) in her mother; CAD (age of onset: 58) in her paternal grandfather and paternal grandmother; Dementia in her mother; Diabetes in her father; Heart disease (age of onset: 18) in her father; Kidney failure in her father; Lung cancer in her other; Mitral valve prolapse in her mother; Sleep apnea in her father. There is no history of Colon cancer.  ROS:   Please see the history of present illness.    ROS All other systems reviewed and are negative.   EKGs/Labs/Other Test Reviewed:    EKG:  EKG is  ordered today.  The ekg ordered today demonstrates normal sinus rhythm, heart rate 84, normal axis, QTC 439, no change since 04/19/2017  Recent Labs: 09/26/2016: Hemoglobin 12.1; Platelets 272.0 03/30/2017: ALT 12; BUN 11; Creatinine, Ser 0.65; Potassium 4.1; Sodium 140; TSH 1.07   Recent Lipid Panel Lab Results  Component Value Date/Time   CHOL 147 03/30/2017 09:38 AM   TRIG 67.0 03/30/2017 09:38 AM   HDL 65.50 03/30/2017 09:38 AM   CHOLHDL 2 03/30/2017 09:38 AM   LDLCALC 68 03/30/2017 09:38 AM    Physical Exam:    VS:  BP 130/82   Pulse 84   Ht  (1.626 m)   Wt 187 lb (84.8 kg)   SpO2 95%   BMI 32.10 kg/m     Wt Readings from Last 3 Encounters:  06/06/17 187 lb (84.8 kg)  04/19/17 185 lb (83.9 kg)  03/30/17 185 lb 8 oz (84.1 kg)     Physical Exam  Constitutional: She is oriented to person, place, and time. She appears well-developed and well-nourished. No distress.  HENT:  Head: Normocephalic and atraumatic.  Neck: Neck supple. No JVD present. Carotid bruit is not present.    Cardiovascular: Normal rate, regular rhythm, S1 normal, S2 normal and normal heart sounds.  No murmur heard. Pulmonary/Chest: Effort normal. She has no rales.  Abdominal: Soft. There is no hepatomegaly.  Musculoskeletal: She exhibits no edema.  Neurological: She is alert and oriented to person, place, and time.  Skin: Skin is warm and dry.    ASSESSMENT & PLAN:    SVT (supraventricular tachycardia) (HCC) She presented to the office today because she was concerned she has had episodes of atrial fibrillation.  Her mother had significant issues with atrial fibrillation in the past.  We were able to pull up her event monitor today.  This did demonstrate several episodes of what appears to be SVT.  I reviewed her monitor with Dr. Katrinka Blazing (attending MD).  Her arrhythmia does not appear to be atrial fibrillation.  Her CHADS2-VASc=2 (female, DM).   Therefore, according to current guidelines, she would not need chronic anticoagulation.  She can continue aspirin for now.  Recent TSH was normal.  After discussion with Dr. Katrinka Blazing, we did feel that follow-up with electrophysiology would be helpful.  -Increase diltiazem to 120 mg twice daily  -Refer to electrophysiology for follow-up of SVT  -Obtain echocardiogram  Chest pain, unspecified type She notes exertional chest pain as well as discomfort at rest.  A lot of her symptoms seem to be related to emotional stress.  However, she does have a history of diabetes.  She certainly is at risk for obstructive coronary disease.  Her ECG is normal.  I have recommended that we proceed with stress testing to further evaluate.  If this is abnormal, she will need cardiac catheterization.  -Arrange exercise nuclear stress test  Shortness of breath  Shortness of breath is likely related to her asthma as well as her arrhythmia.  She does not appear to be volume overloaded on exam.  I will have her undergo an echocardiogram to assess LV function as well as to look at her  atrial size.  Essential hypertension The patient's blood pressure is controlled on her current regimen.     Dispo:  Return in about 3 months (around 09/06/2017) for Routine Follow Up w/ Dr. Delton See.   Medication Adjustments/Labs and Tests Ordered: Current medicines are reviewed at length with the patient today.  Concerns regarding medicines are outlined  above.  Tests Ordered: Orders Placed This Encounter  Procedures  . Myocardial Perfusion Imaging  . EKG 12-Lead  . ECHOCARDIOGRAM COMPLETE   Medication Changes: Meds ordered this encounter  Medications  . diltiazem (CARDIZEM CD) 120 MG 24 hr capsule    Sig: Take 1 capsule (120 mg total) by mouth 2 (two) times daily.    Dispense:  180 capsule    Refill:  3    DOSE INCREASE    Signed, Tereso Newcomer, PA-C  06/06/2017 4:18 PM    [facility] [address] [place], [place], [place]  [address] Phone: [phone]; Fax: [phone]

## 2017-06-06 NOTE — Patient Instructions (Signed)
Medication Instructions:  1. INCREASE DILTIAZEM TO 120 MG TWICE DAILY; NEW RX HAS BEEN SENT IN3  Labwork: NONE ORDERED TODAY  Testing/Procedures: 1. Your physician has requested that you have an echocardiogram. Echocardiography is a painless test that uses sound waves to create images of your heart. It provides your doctor with information about the size and shape of your heart and how well your heart's chambers and valves are working. This procedure takes approximately one hour. There are no restrictions for this procedure.  2. Your physician has requested that you have an exercise stress myoview. For further information please visit [email or web address] Please follow instruction sheet, as given.    Follow-Up: DR. Delton See IN 3 MONTHS   YOU ARE BEING REFERRED TO ELECTROPHYSIOLOGY; DX SVT   Any Other Special Instructions Will Be Listed Below (If Applicable).     If you need a refill on your cardiac medications before your next appointment, please call your pharmacy.

## 2017-06-07 ENCOUNTER — Telehealth: Payer: Self-pay | Admitting: *Deleted

## 2017-06-07 ENCOUNTER — Ambulatory Visit (HOSPITAL_BASED_OUTPATIENT_CLINIC_OR_DEPARTMENT_OTHER): Payer: BC Managed Care – PPO

## 2017-06-07 ENCOUNTER — Other Ambulatory Visit: Payer: Self-pay

## 2017-06-07 ENCOUNTER — Encounter: Payer: Self-pay | Admitting: Physician Assistant

## 2017-06-07 ENCOUNTER — Ambulatory Visit (HOSPITAL_COMMUNITY): Payer: BC Managed Care – PPO | Attending: Cardiology

## 2017-06-07 DIAGNOSIS — R002 Palpitations: Secondary | ICD-10-CM | POA: Diagnosis not present

## 2017-06-07 DIAGNOSIS — R079 Chest pain, unspecified: Secondary | ICD-10-CM | POA: Insufficient documentation

## 2017-06-07 DIAGNOSIS — E669 Obesity, unspecified: Secondary | ICD-10-CM | POA: Insufficient documentation

## 2017-06-07 DIAGNOSIS — I272 Pulmonary hypertension, unspecified: Secondary | ICD-10-CM | POA: Insufficient documentation

## 2017-06-07 DIAGNOSIS — I10 Essential (primary) hypertension: Secondary | ICD-10-CM | POA: Diagnosis not present

## 2017-06-07 DIAGNOSIS — Z8249 Family history of ischemic heart disease and other diseases of the circulatory system: Secondary | ICD-10-CM | POA: Insufficient documentation

## 2017-06-07 DIAGNOSIS — Z6832 Body mass index (BMI) 32.0-32.9, adult: Secondary | ICD-10-CM | POA: Insufficient documentation

## 2017-06-07 DIAGNOSIS — E785 Hyperlipidemia, unspecified: Secondary | ICD-10-CM | POA: Diagnosis not present

## 2017-06-07 DIAGNOSIS — I471 Supraventricular tachycardia: Secondary | ICD-10-CM

## 2017-06-07 DIAGNOSIS — I4891 Unspecified atrial fibrillation: Secondary | ICD-10-CM | POA: Diagnosis not present

## 2017-06-07 DIAGNOSIS — E119 Type 2 diabetes mellitus without complications: Secondary | ICD-10-CM | POA: Diagnosis not present

## 2017-06-07 DIAGNOSIS — G4733 Obstructive sleep apnea (adult) (pediatric): Secondary | ICD-10-CM | POA: Diagnosis not present

## 2017-06-07 LAB — MYOCARDIAL PERFUSION IMAGING
CHL CUP MPHR: 162 {beats}/min
CHL CUP NUCLEAR SDS: 0
CHL CUP NUCLEAR SRS: 1
CSEPPHR: 139 {beats}/min
Estimated workload: 7 METS
Exercise duration (min): 6 min
Exercise duration (sec): 0 s
LVDIAVOL: 84 mL (ref 46–106)
LVSYSVOL: 31 mL
NUC STRESS TID: 1.03
Percent HR: 85 %
RATE: 0.09
Rest HR: 82 {beats}/min
SSS: 1

## 2017-06-07 MED ORDER — TECHNETIUM TC 99M TETROFOSMIN IV KIT
10.4000 | PACK | Freq: Once | INTRAVENOUS | Status: AC | PRN
  Administered 2017-06-07: 10.4 via INTRAVENOUS
  Filled 2017-06-07: qty 11

## 2017-06-07 MED ORDER — TECHNETIUM TC 99M TETROFOSMIN IV KIT
32.4000 | PACK | Freq: Once | INTRAVENOUS | Status: AC | PRN
  Administered 2017-06-07: 32.4 via INTRAVENOUS
  Filled 2017-06-07: qty 33

## 2017-06-07 NOTE — Telephone Encounter (Signed)
-----   Message from Beatrice Lecher, [place] sent at 06/07/2017  4:43 PM EDT ----- Please call the patient. The stress test is normal.   Continue current medications and follow up as planned.  Please fax a copy to PCP:  Wanda Plump, MD  Tereso Newcomer, PA-C    06/07/2017 4:39 PM

## 2017-06-07 NOTE — Telephone Encounter (Signed)
Pt has been notified of both Myoview and echo results. I asked pt per Bing Neighbors. PA if pt has a h/o snoring. Pt answered yes. I discussed with the pt recommendation per PA to order sleep study. Pt answered she had a sleep study last year and the results showed she was "borderline" though doctor did not feel she needed a CPAP. I advised pt I will let PA know. Pt thanked me for the call today. Results have been faxed to Dr. Willow Ora.

## 2017-06-07 NOTE — Telephone Encounter (Signed)
[place], [place]    06/07/2017 8:00 PM

## 2017-06-23 ENCOUNTER — Other Ambulatory Visit: Payer: Self-pay | Admitting: Internal Medicine

## 2017-06-25 ENCOUNTER — Telehealth: Payer: Self-pay | Admitting: *Deleted

## 2017-06-25 DIAGNOSIS — R0602 Shortness of breath: Secondary | ICD-10-CM

## 2017-06-25 DIAGNOSIS — R0683 Snoring: Secondary | ICD-10-CM

## 2017-06-25 NOTE — Telephone Encounter (Signed)
Message received from Veda Canning, CMA about Sleep Study. Will check with Veda Canning as to if pt has been scheduled for Split Night Sleep Study.

## 2017-06-25 NOTE — Telephone Encounter (Signed)
-----   Message from Danton Clap sent at 06/25/2017 11:37 AM EDT ----- Good morning!  Pt states she was told she would get a call regarding having a sleep study done, she hasn't heard anything and wanted to fu. [phone].  Thanks, [employer]

## 2017-06-25 NOTE — Telephone Encounter (Signed)
Per 5/1 phone note split night ordered

## 2017-06-26 ENCOUNTER — Encounter: Payer: Self-pay | Admitting: Internal Medicine

## 2017-06-26 ENCOUNTER — Ambulatory Visit: Payer: BC Managed Care – PPO | Admitting: Internal Medicine

## 2017-06-26 VITALS — BP 112/64 | HR 91 | Ht 64.0 in | Wt 189.0 lb

## 2017-06-26 DIAGNOSIS — R002 Palpitations: Secondary | ICD-10-CM | POA: Diagnosis not present

## 2017-06-26 DIAGNOSIS — I471 Supraventricular tachycardia: Secondary | ICD-10-CM | POA: Diagnosis not present

## 2017-06-26 NOTE — Progress Notes (Signed)
ELECTROPHYSIOLOGY CONSULT NOTE  Patient ID: JERELINE TICER, MRN: [id number], DOB/AGE: 07/05/1960 57 y.o. Admit date: (Not on file) Date of Consult: 06/26/2017  Primary Physician: Wanda Plump, MD Primary Cardiologist: Verlin Fester     CHELISE HANGER is a 57 y.o. female who is being seen today for the evaluation of palpitations and chest pain at the request of [place].    HPI CLARITA MCELVAIN is a 57 y.o. female  Referred for recurrent palpitations which have been increasingly problematic  They are aggravated by stress of which she has a great deal in the caring for her demented father an Pennyburn and her mother with Lewey body dementia  She herself has asthma and has been treated with seroids and inhalers. This has been assoc with chest pain and stress makes this worse;  Activity is limited by SOB and some vague chest discomfort  Long standing history of palps previously assoc with PACs   She underwent testing   DATE TEST EF   5/19 Echo   55-60 %   5/19 myoview   64 % No ischemia         Date Hgb TSH  8/18 12.1   2/19   1.07    OVerwhelming social situation in the care of her parents,    She snores with daytime somnolence    Past Medical History:  Diagnosis Date  . Asthma, moderate persistent    sees Dr Irena Cords  . Diabetes mellitus    A1C 6.2---->  2009  . Family history of adverse reaction to anesthesia    sister had cardiac arrest with rhinoplasty  . Fibrocystic breast   . GERD (gastroesophageal reflux disease)   . HH (hiatus hernia) 09/2014   Dr. Ewing Schlein  . History of echocardiogram    Echo 5/19: EF 55-60, normal wall motion, PASP 41  . History of hiatal hernia   . History of nuclear stress test    Nuclear stress test 5/19:  EF 64, normal perfusion, Low risk  . Hot flashes   . Hyperlipidemia   . Normal nuclear stress test 2006  . Palpitations    PACs sees cards   . Recurrent cystitis    took  postcoital antibiotic for years, symptoms better after her  hysterectomy 1996, symptoms re-surface in 2011  . RUQ abdominal pain 2016   EGD showed small HH, otherwise normal  . Wears glasses       Surgical History:  Past Surgical History:  Procedure Laterality Date  . ABDOMINAL HYSTERECTOMY  1996   no oophorectomy, d/tendometriosis and fibroids approximately 1996  . BREAST EXCISIONAL BIOPSY Right 03/12/2014   [facility]  . BREAST LUMPECTOMY WITH RADIOACTIVE SEED LOCALIZATION Right 03/12/2014   Procedure: BREAST LUMPECTOMY WITH RADIOACTIVE SEED LOCALIZATION;  Surgeon: Harriette Bouillon, MD;  Location: [facility];  Service: General;  Laterality: Right;  . BREAST SURGERY     Lumpectomy-- BENIGN   . CATARACT EXTRACTION Bilateral 2011  . CESAREAN SECTION    . COSMETIC SURGERY  01/2017   MonaLisa Touch w/ GYN  . DILATION AND CURETTAGE OF UTERUS    . LIPOMA EXCISION  1978  . UPPER GASTROINTESTINAL ENDOSCOPY    . UPPER GI ENDOSCOPY  09/11/2014   Small Hiatus Hernia, Dr. Ewing Schlein     Home Meds: Prior to Admission medications   Medication Sig Start Date End Date Taking? Authorizing Provider  albuterol (PROVENTIL HFA;VENTOLIN HFA) 108 (90 BASE) MCG/ACT inhaler Inhale 2 puffs  into the lungs every 6 (six) hours as needed.    [provider]  bifidobacterium infantis (ALIGN) capsule Take 1 capsule by mouth daily.    [provider]  budesonide-formoterol (SYMBICORT) 160-4.5 MCG/ACT inhaler Inhale 2 puffs into the lungs 2 (two) times daily. 09/14/15   [provider]  Calcium Carbonate-Vitamin D (CALCIUM 600+D) 600-200 MG-UNIT TABS Take 1 tablet by mouth daily.    [provider]  Continuous Blood Gluc Receiver (FREESTYLE LIBRE 14 DAY READER) DEVI USE AS DIRECTED 05/28/17   Wanda Plump, MD  Continuous Blood Gluc Sensor (FREESTYLE LIBRE 14 DAY SENSOR) MISC 1 Container by Does not apply route as needed. 03/01/17   Wanda Plump, MD  Continuous Blood Gluc Sensor (FREESTYLE LIBRE SENSOR SYSTEM) MISC CHECK BLOOD SUGAR DAILY AS  DIRECTED. CHANGE DEVICE EVERY 10 DAYS 09/29/16   Wanda Plump, MD  diclofenac sodium (VOLTAREN) 1 % GEL Apply 2 g topically 4 (four) times daily. 11/02/16   Wanda Plump, MD  diltiazem (CARDIZEM CD) 120 MG 24 hr capsule Take 1 capsule (120 mg total) by mouth 2 (two) times daily. 06/06/17 09/04/17  Tereso Newcomer T, PA-C  diltiazem (CARDIZEM) 30 MG tablet Take 1 tablet (30 mg total) by mouth every 6 (six) hours as needed (for palpitations). 04/19/17   Bhagat, Sharrell Ku, PA  EPINEPHrine (AUVI-Q) 0.3 mg/0.3 mL IJ SOAJ injection Inject 0.3 mg into the muscle as directed.    [provider]  fexofenadine (ALLEGRA) 180 MG tablet Take 180 mg by mouth every morning.     [provider]  fluticasone (FLONASE) 50 MCG/ACT nasal spray Place 2 sprays into the nose daily. AND IF NEEDED 2 SPRAYS AT NIGHT    [provider]  hydrocortisone 2.5 % cream APPLY EXTERNALLY TO THE AFFECTED AREA TWICE DAILY AS NEEDED 01/28/16   Wanda Plump, MD  metFORMIN (GLUCOPHAGE) 1000 MG tablet TAKE 1&1/2 TABLETS BY MOUTH EVERY MORNING AND 1 TABLET EVERY EVENING 06/25/17   Paz, Nolon Rod, MD  montelukast (SINGULAIR) 10 MG tablet Take 10 mg by mouth at bedtime.      [provider]  Multiple Vitamins-Minerals (MULTIVITAMIN WITH MINERALS) tablet Take 1 tablet by mouth daily.    [provider]  nitrofurantoin, macrocrystal-monohydrate, (MACROBID) 100 MG capsule TAKE AS DIRECTED POST COITAL 01/28/16   Wanda Plump, MD  omeprazole (PRILOSEC) 40 MG capsule Take 1 capsule (40 mg total) by mouth 2 (two) times daily. 06/24/15   Wanda Plump, MD  PRESCRIPTION MEDICATION Allergy Shots    [provider]  rosuvastatin (CRESTOR) 5 MG tablet TAKE 1 TABLET(5 MG) BY MOUTH DAILY 12/22/16   Lars Masson, MD  sitaGLIPtin (JANUVIA) 100 MG tablet Take 1 tablet (100 mg total) by mouth daily. 10/16/16   Wanda Plump, MD  Tiotropium Bromide Monohydrate (SPIRIVA RESPIMAT) 2.5 MCG/ACT AERS Inhale 2 puffs into the  lungs daily.    [provider]    Allergies:  Allergies  Allergen Reactions  . Butorphanol Tartrate Other (See Comments)    REACTION: hallucinations  . Hydrocodone Other (See Comments)    HALLUCINATIONS  . Roxicet [Oxycodone-Acetaminophen] Other (See Comments)    HALLUCINATIONS  . Penicillins Hives    Occurred at age 34/CHILDHOOD ALLERGY Has patient had a PCN reaction causing immediate rash, facial/tongue/throat swelling, SOB or lightheadedness with hypotension: Unknown Has patient had a PCN reaction causing severe rash involving mucus membranes or skin necrosis: Unknown Has patient had a PCN reaction  that required hospitalization: Unknown Has patient had a PCN reaction occurring within the last 10 years: No If all of the above answers are "NO", then may proceed with Cephalosporin use.   Marland Kitchen Zofran [Ondansetron Hcl] Other (See Comments)    Hair loss    Social History   Socioeconomic History  . Marital status: Married    Spouse name: Not on file  . Number of children: 1  . Years of education: College  . Highest education level: Not on file  Occupational History  . Occupation: retired principal  Engineer, production  . Financial resource strain: Not on file  . Food insecurity:    Worry: Not on file    Inability: Not on file  . Transportation needs:    Medical: Not on file    Non-medical: Not on file  Tobacco Use  . Smoking status: Never Smoker  . Smokeless tobacco: Never Used  Substance and Sexual Activity  . Alcohol use: No  . Drug use: No  . Sexual activity: Yes  Lifestyle  . Physical activity:    Days per week: Not on file    Minutes per session: Not on file  . Stress: Not on file  Relationships  . Social connections:    Talks on phone: Not on file    Gets together: Not on file    Attends religious service: Not on file    Active member of club or organization: Not on file    Attends meetings of clubs or organizations: Not on file    Relationship status:  Not on file  . Intimate partner violence:    Fear of current or ex partner: Not on file    Emotionally abused: Not on file    Physically abused: Not on file    Forced sexual activity: Not on file  Other Topics Concern  . Not on file  Social History Narrative   Household-- pt and husband    Rare caffeine use   Daughter independent, 1 G-child   Engineer, structural for F and M, both w/ dementia     Family History  Problem Relation Age of Onset  . Mitral valve prolapse Mother   . Breast cancer Mother 69       M had ca insitu (age 40), aunt   . Dementia Mother   . Heart disease Father 56       early   . Diabetes Father   . Kidney failure Father   . Sleep apnea Father   . Brain cancer Other        2 fam members   . Lung cancer Other        uncle, heavy smoker  . Breast cancer Maternal Aunt 60  . CAD Paternal Grandmother 50  . CAD Paternal Grandfather 62  . Colon cancer Neg Hx      ROS:  Please see the history of present illness.     All other systems reviewed and negative.    BP 112/64   Pulse 91   Ht  (1.626 m)   Wt 189 lb (85.7 kg)   SpO2 98%   BMI 32.44 kg/m   Alert and oriented in no acute distress HENT- normal Eyes- EOMI, without scleral icterus Skin- warm and dry; without rashes LN-neg Neck- supple without thyromegaly, JVP-flat, carotids brisk and full without bruits Back-without CVAT or kyphosis Lungs-clear to auscultation CV-Regular rate and rhythm, nl S1 and S2, no murmurs gallops or rubs, S4-absent Abd-soft with active  bowel sounds; no midline pulsation or hepatomegaly Pulses-intact femoral and distal MKS-without gross deformity Neuro- Ax O, CN3-12 intact, grossly normal motor and sensory function Affect engaging, tearful      Labs: Cardiac Enzymes No results for input(s): CKTOTAL, CKMB, TROPONINI in the last 72 hours. CBC Lab Results  Component Value Date   WBC 5.3 09/26/2016   HGB 12.1 09/26/2016   HCT 37.0 09/26/2016   MCV 84.1 09/26/2016    PLT 272.0 09/26/2016   PROTIME: No results for input(s): LABPROT, INR in the last 72 hours. Chemistry No results for input(s): NA, K, CL, CO2, BUN, CREATININE, CALCIUM, PROT, BILITOT, ALKPHOS, ALT, AST, GLUCOSE in the last 168 hours.  Invalid input(s): LABALBU Lipids Lab Results  Component Value Date   CHOL 147 03/30/2017   HDL 65.50 03/30/2017   LDLCALC 68 03/30/2017   TRIG 67.0 03/30/2017   BNP No results found for: PROBNP Thyroid Function Tests: No results for input(s): TSH, T4TOTAL, T3FREE, THYROIDAB in the last 72 hours.  Invalid input(s): FREET3 Miscellaneous No results found for: DDIMER  Radiology/Studies:  No results found.  EKG: sinur @ 91 15/07/36  Event Recorder personnally reviewed  No symptomatic palpitations were recorded.  There was a nonsustained episode of atrial tachycardia.  He was not during the time that she recalled tachycardia.  Repeated episodes of chest discomfort associated with sinus rhythm   Assessment and Plan:  Chest pain-atypical  Palpitations-question mechanism  Atrial tachycardia-nonsustained  GE reflux disease  Stress related to parental care  Sleep disordered breathing   The patient's chest pain syndrome likely is related to reflux which may have been aggravated by the intercurrent addition of Cardizem for palpitations.  This was chosen as opposed to beta-blockers because of a history of allergies for which she has an EpiPen  Mechanism of her palpitations is not clear as there is no correlation between the monitor and her symptoms.  She did have nonsustained atrial tachycardia is certainly could underlie many of her arrhythmic symptoms.  Given the issues of Cardizem and her lower esophageal central, we will discontinue it.  In the event that she has more symptomatic palpitations, we could either try flecainide and in the interim I will also be looking into beta-blockers used in conjunction with an EpiPen  We will undertake a  home sleep study given her sleep disordered breathing.  Modest pulmonary hypertension was noted.  This occurs in the context of a normal E/E'    Sherryl Manges

## 2017-06-26 NOTE — Patient Instructions (Signed)
Medication Instructions:  Your physician has recommended you make the following change in your medication:   1. Stop Diltiazem  Labwork: None ordered.  Testing/Procedures: Your physician has recommended that you have a sleep study. This test records several body functions during sleep, including: brain activity, eye movement, oxygen and carbon dioxide blood levels, heart rate and rhythm, breathing rate and rhythm, the flow of air through your mouth and nose, snoring, body muscle movements, and chest and belly movement.   Follow-Up: Your physician recommends that you schedule a follow-up appointment as needed.   Any Other Special Instructions Will Be Listed Below (If Applicable).     If you need a refill on your cardiac medications before your next appointment, please call your pharmacy.

## 2017-06-27 ENCOUNTER — Encounter: Payer: Self-pay | Admitting: *Deleted

## 2017-06-27 MED ORDER — ESZOPICLONE 2 MG PO TABS: 2.0000 mg | ORAL_TABLET | Freq: Every evening | ORAL | 0 refills | Status: DC | PRN

## 2017-06-27 NOTE — Telephone Encounter (Addendum)
Patient is scheduled for lab study on August 03 2017. Patient understands her sleep study will be done at [facility] sleep lab. Patient understands she will receive a sleep packet in a week or so. Patient understands to call if she does not receive the sleep packet in a timely manner. Patient agrees with treatment and thanked me for call.

## 2017-06-27 NOTE — Telephone Encounter (Signed)
Called spoke to the pharmacist Jerrel Ivory) at [facility] in [place] to order Lunesta 2 mg to be taken at patient's sleep study. . Patient is aware the medication has been called in.

## 2017-06-27 NOTE — Addendum Note (Signed)
Addended by: Reesa Chew on: 06/27/2017 03:28 PM   Modules accepted: Orders

## 2017-06-28 ENCOUNTER — Ambulatory Visit: Payer: BC Managed Care – PPO | Admitting: Family

## 2017-06-28 ENCOUNTER — Encounter: Payer: Self-pay | Admitting: Family

## 2017-06-28 VITALS — BP 130/76 | HR 88 | Temp 98.2°F | Ht 64.0 in | Wt 191.0 lb

## 2017-06-28 DIAGNOSIS — R6889 Other general symptoms and signs: Secondary | ICD-10-CM

## 2017-06-28 LAB — POC INFLUENZA A&B (BINAX/QUICKVUE)
INFLUENZA A, POC: NEGATIVE
INFLUENZA B, POC: NEGATIVE

## 2017-06-28 MED ORDER — AZITHROMYCIN 250 MG PO TABS: ORAL_TABLET | ORAL | 0 refills | Status: DC

## 2017-06-28 MED ORDER — BENZONATATE 100 MG PO CAPS: 100.0000 mg | ORAL_CAPSULE | Freq: Three times a day (TID) | ORAL | 0 refills | Status: DC | PRN

## 2017-06-28 NOTE — Progress Notes (Signed)
Karen Barnes is a 57 y.o. female with the following history as recorded in EpicCare:  Patient Active Problem List   Diagnosis Date Noted  . Edema of both ankles 02/03/2016  . 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 OSA (obstructive sleep apnea) 02/03/2016  . Solitary pulmonary nodule 11/03/2015  . Cough 11/03/2015  . PCP NOTES >>>>>>>>>>>>>>>>>>>>>>>>>>>>>>>>>>> 03/26/2015  . Annual physical exam 01/10/2013  . Family history of breast cancer 02/23/2012  . At risk for coronary artery disease 02/07/2012  . Obesity 12/13/2010  . Heart palpitations 12/13/2010  . Elevated blood pressure reading 12/13/2010  . Hyperlipidemia 03/07/2007  . [facility] 03/07/2007  . CYSTITIS, RECURRENT 03/07/2007  . DMII (diabetes mellitus, type 2) (HCC) 03/07/2007  . Asthma 07/12/2006  . GERD 07/12/2006    Current Outpatient Medications  Medication Sig Dispense Refill  . albuterol (PROVENTIL HFA;VENTOLIN HFA) 108 (90 BASE) MCG/ACT inhaler Inhale 2 puffs into the lungs every 6 (six) hours as needed.    . bifidobacterium infantis (ALIGN) capsule Take 1 capsule by mouth daily.    . budesonide-formoterol (SYMBICORT) 160-4.5 MCG/ACT inhaler Inhale 2 puffs into the lungs 2 (two) times daily.    . Calcium Carbonate-Vitamin D (CALCIUM 600+D) 600-200 MG-UNIT TABS Take 1 tablet by mouth daily.    . Continuous Blood Gluc Receiver (FREESTYLE LIBRE 14 DAY READER) DEVI USE AS DIRECTED 2 Device 3  . Continuous Blood Gluc Sensor (FREESTYLE LIBRE 14 DAY SENSOR) MISC 1 Container by Does not apply route as needed. 1 each 5  . Continuous Blood Gluc Sensor (FREESTYLE LIBRE SENSOR SYSTEM) MISC CHECK BLOOD SUGAR DAILY AS DIRECTED. CHANGE DEVICE EVERY 10 DAYS 3 each 12  . diclofenac sodium (VOLTAREN) 1 % GEL Apply 2 g topically 4 (four) times daily. 1 Tube 5  . EPINEPHrine (AUVI-Q) 0.3 mg/0.3 mL IJ SOAJ injection Inject 0.3 mg into the muscle as directed.    . eszopiclone (LUNESTA) 2 MG TABS tablet Take 1 tablet (2 mg total) by mouth at  bedtime as needed for up to 1 day for sleep. Take immediately before bedtime 1 tablet 0  . fexofenadine (ALLEGRA) 180 MG tablet Take 180 mg by mouth every morning.     . fluticasone (FLONASE) 50 MCG/ACT nasal spray Place 2 sprays into the nose daily. AND IF NEEDED 2 SPRAYS AT NIGHT    . hydrocortisone 2.5 % cream APPLY EXTERNALLY TO THE AFFECTED AREA TWICE DAILY AS NEEDED 30 g 0  . metFORMIN (GLUCOPHAGE) 1000 MG tablet TAKE 1&1/2 TABLETS BY MOUTH EVERY MORNING AND 1 TABLET EVERY EVENING 225 tablet 1  . montelukast (SINGULAIR) 10 MG tablet Take 10 mg by mouth at bedtime.      . Multiple Vitamins-Minerals (MULTIVITAMIN WITH MINERALS) tablet Take 1 tablet by mouth daily.    . nitrofurantoin, macrocrystal-monohydrate, (MACROBID) 100 MG capsule TAKE AS DIRECTED POST COITAL 30 capsule 0  . omeprazole (PRILOSEC) 40 MG capsule Take 1 capsule (40 mg total) by mouth 2 (two) times daily. 180 capsule 3  . PRESCRIPTION MEDICATION Allergy Shots    . rosuvastatin (CRESTOR) 5 MG tablet TAKE 1 TABLET(5 MG) BY MOUTH DAILY 90 tablet 3  . sitaGLIPtin (JANUVIA) 100 MG tablet Take 1 tablet (100 mg total) by mouth daily. 90 tablet 2  . SPIRIVA RESPIMAT 1.25 MCG/ACT AERS INL 2 PFS PO QD  5  . Tiotropium Bromide Monohydrate (SPIRIVA RESPIMAT) 2.5 MCG/ACT AERS Inhale 2 puffs into the lungs daily.    Marland Kitchen diltiazem (CARDIZEM) 30 MG tablet Take 1 tablet (  30 mg total) by mouth every 6 (six) hours as needed (for palpitations). (Patient not taking: Reported on 06/28/2017) 30 tablet 1   No current facility-administered medications for this visit.     Allergies: Butorphanol tartrate; Hydrocodone; Roxicet [oxycodone-acetaminophen]; Penicillins; and Zofran [ondansetron hcl]  Past Medical History:  Diagnosis Date  . Asthma, moderate persistent    sees Dr Irena Cords  . Diabetes mellitus    A1C 6.2---->  2009  . Family history of adverse reaction to anesthesia    sister had cardiac arrest with rhinoplasty  . Fibrocystic breast    . GERD (gastroesophageal reflux disease)   . HH (hiatus hernia) 09/2014   Dr. Ewing Schlein  . History of echocardiogram    Echo 5/19: EF 55-60, normal wall motion, PASP 41  . History of hiatal hernia   . History of nuclear stress test    Nuclear stress test 5/19:  EF 64, normal perfusion, Low risk  . Hot flashes   . Hyperlipidemia   . Normal nuclear stress test 2006  . Palpitations    PACs sees cards   . Recurrent cystitis    took  postcoital antibiotic for years, symptoms better after her hysterectomy 1996, symptoms re-surface in 2011  . RUQ abdominal pain 2016   EGD showed small HH, otherwise normal  . Wears glasses     Past Surgical History:  Procedure Laterality Date  . ABDOMINAL HYSTERECTOMY  1996   no oophorectomy, d/tendometriosis and fibroids approximately 1996  . BREAST EXCISIONAL BIOPSY Right 03/12/2014   [facility]  . BREAST LUMPECTOMY WITH RADIOACTIVE SEED LOCALIZATION Right 03/12/2014   Procedure: BREAST LUMPECTOMY WITH RADIOACTIVE SEED LOCALIZATION;  Surgeon: Harriette Bouillon, MD;  Location: [facility];  Service: General;  Laterality: Right;  . BREAST SURGERY     Lumpectomy-- BENIGN   . CATARACT EXTRACTION Bilateral 2011  . CESAREAN SECTION    . COSMETIC SURGERY  01/2017   MonaLisa Touch w/ GYN  . DILATION AND CURETTAGE OF UTERUS    . LIPOMA EXCISION  1978  . UPPER GASTROINTESTINAL ENDOSCOPY    . UPPER GI ENDOSCOPY  09/11/2014   Small Hiatus Hernia, Dr. Ewing Schlein    Family History  Problem Relation Age of Onset  . Mitral valve prolapse Mother   . Breast cancer Mother 56       M had ca insitu (age 47), aunt   . Dementia Mother   . Heart disease Father 53       early   . Diabetes Father   . Kidney failure Father   . Sleep apnea Father   . Brain cancer Other        2 fam members   . Lung cancer Other        uncle, heavy smoker  . Breast cancer Maternal Aunt 60  . CAD Paternal Grandmother 66  . CAD Paternal Grandfather 50  . Colon cancer Neg Hx     Social  History   Tobacco Use  . Smoking status: Never Smoker  . Smokeless tobacco: Never Used  Substance Use Topics  . Alcohol use: No    Subjective:  Patient woke up this morning with sudden onset of sore throat, body aches, congestion; + fever, chills; fever as high as 99.5; has taken Tylenol today; family member sick with similar symptoms; patient's husband has exact same symptoms which started today; patient has underlying asthma- no difficulty breathing today; notes that blood sugars have been normal in the past 1-2 days;  Objective:  Vitals:   06/28/17 1526  BP: 130/76  Pulse: 88  Temp: 98.2 F (36.8 C)  TempSrc: Oral  SpO2: 97%  Weight: 191 lb (86.6 kg)  Height:  (1.626 m)    General: Well developed, well nourished, in no acute distress  Skin : Warm and dry.  Head: Normocephalic and atraumatic  Eyes: Sclera and conjunctiva clear; pupils round and reactive to light; extraocular movements intact  Ears: External normal; canals clear; tympanic membranes normal  Oropharynx: Pink, supple. No suspicious lesions  Neck: Supple without thyromegaly, adenopathy  Lungs: Respirations unlabored; clear to auscultation bilaterally without wheeze, rales, rhonchi  CVS exam: normal rate and regular rhythm.  Neurologic: Alert and oriented; speech intact; face symmetrical; moves all extremities well; CNII-XII intact without focal deficit   Assessment:  1. Flu-like symptoms     Plan:  Rapid flu is negative; physical exam is reassuring; Rx for [employer] to use for symptoms; Rx for Z-pak to hold and fill only if worsening over the holiday weekend; increase fluids, follow-up worse, no better.   No follow-ups on file.  Orders Placed This Encounter  Procedures  . POC Influenza A&B (Binax test)    Requested Prescriptions    No prescriptions requested or ordered in this encounter

## 2017-07-23 ENCOUNTER — Encounter (HOSPITAL_BASED_OUTPATIENT_CLINIC_OR_DEPARTMENT_OTHER): Payer: BC Managed Care – PPO

## 2017-08-03 ENCOUNTER — Ambulatory Visit (HOSPITAL_BASED_OUTPATIENT_CLINIC_OR_DEPARTMENT_OTHER): Payer: BC Managed Care – PPO | Attending: Physician Assistant | Admitting: Cardiology

## 2017-08-03 DIAGNOSIS — R0602 Shortness of breath: Secondary | ICD-10-CM

## 2017-08-03 DIAGNOSIS — Z79899 Other long term (current) drug therapy: Secondary | ICD-10-CM | POA: Diagnosis not present

## 2017-08-03 DIAGNOSIS — G4719 Other hypersomnia: Secondary | ICD-10-CM

## 2017-08-03 DIAGNOSIS — R0683 Snoring: Secondary | ICD-10-CM

## 2017-08-05 NOTE — Procedures (Signed)
   Patient Name: Karen Barnes, Elaina Study Date:01/23/2017 08/03/2017 Gender: Female D.O.B: Feb 11, 1960 Age (years): [age] Referring Provider: Tereso NewcomerScott Weaver Height (inches): 64 Interpreting Physician: Armanda Magicraci Turner MD, ABSM Weight (lbs): 183 RPSGT: Rolene ArbourMcConnico, Yvonne BMI: 31 MRN: [id number] Neck Size: 15.50  CLINICAL INFORMATION Sleep Study Type: NPSG  Indication for sleep study: Snoring  Epworth Sleepiness Score: 12  SLEEP STUDY TECHNIQUE As per the AASM Manual for the Scoring of Sleep and Associated Events v2.3 (April 2016) with a hypopnea requiring 4% desaturations.  The channels recorded and monitored were frontal, central and occipital EEG, electrooculogram (EOG), submentalis EMG (chin), nasal and oral airflow, thoracic and abdominal wall motion, anterior tibialis EMG, snore microphone, electrocardiogram, and pulse oximetry.  MEDICATIONS Medications self-administered by patient taken the night of the study : LUNESTA  SLEEP ARCHITECTURE The study was initiated at 10:45:41 PM and ended at 4:56:39 AM.  Sleep onset time was 11.9 minutes and the sleep efficiency was 88.8%%. The total sleep time was 329.6 minutes.  Stage REM latency was 67.5 minutes.  The patient spent 1.8%% of the night in stage N1 sleep, 71.0%% in stage N2 sleep, 7.3%% in stage N3 and 19.87% in REM.  Alpha intrusion was absent.  Supine sleep was 58.89%.  RESPIRATORY PARAMETERS The overall apnea/hypopnea index (AHI) was 3.3 per hour. There were 0 total apneas, including 0 obstructive, 0 central and 0 mixed apneas. There were 18 hypopneas and 10 RERAs.  The AHI during Stage REM sleep was 10.1 per hour.  AHI while supine was 1.9 per hour.  The mean oxygen saturation was 93.3%. The minimum SpO2 during sleep was 0.0%.  moderate snoring was noted during this study.  CARDIAC DATA The 2 lead EKG demonstrated sinus rhythm. The mean heart rate was 76.4 beats per minute. Other EKG findings include: None.  LEG  MOVEMENT DATA The total PLMS were 0 with a resulting PLMS index of 0.0. Associated arousal with leg movement index was 0.4 .  IMPRESSIONS - No significant obstructive sleep apnea occurred during this study (AHI = 3.3/h). - No significant central sleep apnea occurred during this study (CAI = 0.0/h). - no significant oxygen desaturation was noted during this study (Min O2 = 0.0%). - The patient snored with moderate snoring volume. - No cardiac abnormalities were noted during this study. - Clinically significant periodic limb movements did not occur during sleep. No significant associated arousals.  DIAGNOSIS - Normal Study  RECOMMENDATIONS - Avoid alcohol, sedatives and other CNS depressants that may worsen sleep apnea and disrupt normal sleep architecture. - Sleep hygiene should be reviewed to assess factors that may improve sleep quality. - Weight management and regular exercise should be initiated or continued if appropriate.  [Electronically signed] 08/05/2017 01:42 PM  Armanda Magicraci Turner MD, ABSM Diplomate, American Board of Sleep Medicine

## 2017-08-07 ENCOUNTER — Telehealth: Payer: Self-pay | Admitting: *Deleted

## 2017-08-07 NOTE — Telephone Encounter (Signed)
Informed patient of sleep study results and patient understanding was verbalized. Patient understands her sleep study showed no significant sleep apnea.   Pt is aware and agreeable to normal results.  

## 2017-08-07 NOTE — Telephone Encounter (Signed)
-----   Message from Quintella Reichertraci R Turner, MD sent at 08/05/2017  1:47 PM EDT ----- Please let patient know that sleep study showed no significant sleep apnea.

## 2017-08-12 ENCOUNTER — Other Ambulatory Visit: Payer: Self-pay | Admitting: Internal Medicine

## 2017-08-23 NOTE — Progress Notes (Signed)
Patient Name: Karen Barnes Date of Encounter: 08/24/2017  Primary Care Provider:  Wanda Plump, MD Primary Cardiologist:  Tobias Alexander, MD  Problem List   Past Medical History:  Diagnosis Date  . Asthma, moderate persistent    sees Dr Irena Cords  . Diabetes mellitus    A1C 6.2---->  2009  . Family history of adverse reaction to anesthesia    sister had cardiac arrest with rhinoplasty  . Fibrocystic breast   . GERD (gastroesophageal reflux disease)   . HH (hiatus hernia) 09/2014   Dr. Ewing Schlein  . History of echocardiogram    Echo 5/19: EF 55-60, normal wall motion, PASP 41  . History of hiatal hernia   . History of nuclear stress test    Nuclear stress test 5/19:  EF 64, normal perfusion, Low risk  . Hot flashes   . Hyperlipidemia   . Normal nuclear stress test 2006  . Palpitations    PACs sees cards   . Recurrent cystitis    took  postcoital antibiotic for years, symptoms better after her hysterectomy 1996, symptoms re-surface in 2011  . RUQ abdominal pain 2016   EGD showed small HH, otherwise normal  . Wears glasses    Past Surgical History:  Procedure Laterality Date  . ABDOMINAL HYSTERECTOMY  1996   no oophorectomy, d/tendometriosis and fibroids approximately 1996  . BREAST EXCISIONAL BIOPSY Right 03/12/2014   [facility]  . BREAST LUMPECTOMY WITH RADIOACTIVE SEED LOCALIZATION Right 03/12/2014   Procedure: BREAST LUMPECTOMY WITH RADIOACTIVE SEED LOCALIZATION;  Surgeon: Harriette Bouillon, MD;  Location: [facility];  Service: General;  Laterality: Right;  . BREAST SURGERY     Lumpectomy-- BENIGN   . CATARACT EXTRACTION Bilateral 2011  . CESAREAN SECTION    . COSMETIC SURGERY  01/2017   MonaLisa Touch w/ GYN  . DILATION AND CURETTAGE OF UTERUS    . LIPOMA EXCISION  1978  . UPPER GASTROINTESTINAL ENDOSCOPY    . UPPER GI ENDOSCOPY  09/11/2014   Small Hiatus Hernia, Dr. Ewing Schlein    Allergies  Allergies  Allergen Reactions  . Butorphanol Tartrate Other  (See Comments)    REACTION: hallucinations  . Hydrocodone Other (See Comments)    HALLUCINATIONS  . Roxicet [Oxycodone-Acetaminophen] Other (See Comments)    HALLUCINATIONS  . Penicillins Hives    Occurred at age 33/CHILDHOOD ALLERGY Has patient had a PCN reaction causing immediate rash, facial/tongue/throat swelling, SOB or lightheadedness with hypotension: Unknown Has patient had a PCN reaction causing severe rash involving mucus membranes or skin necrosis: Unknown Has patient had a PCN reaction that required hospitalization: Unknown Has patient had a PCN reaction occurring within the last 10 years: No If all of the above answers are "NO", then may proceed with Cephalosporin use.   Marland Kitchen Zofran [Ondansetron Hcl] Other (See Comments)    Hair loss    HPI Karen Barnes is a 57 y.o. female with diabetes, hypertension, hyperlipidemia, palpitations, prior subdural hematoma (2/2 to a mechanical fall in 01/2017), asthma, strong family hx of CAD and AFib.  She has not been able to tolerate beta-blockers in the past due to asthma, but her PVCs have been controlled on Diltiazem.  She was last seen by [name], PA-C 04/19/17 for worsening palpitations that were somewhat prolonged.  An event monitor was arranged.   She was seen by Dr Graciela Husbands in May 2019: Referred for recurrent palpitations which have been increasingly problematic  They are aggravated by stress  of which she has a great deal in the caring for her demented father an [place]burn and her mother with Lewey body dementia. She herself has asthma and has been treated with seroids and inhalers. This has been assoc with chest pain and stress makes this worse;  Activity is limited by SOB and some vague chest discomfort. Long standing history of palps previously assoc with PACs.  The patient's chest pain syndrome likely is related to reflux which may have been aggravated by the intercurrent addition of Cardizem for palpitations.  This was chosen as  opposed to beta-blockers because of a history of allergies for which she has an EpiPen. Mechanism of her palpitations is not clear as there is no correlation between the monitor and her symptoms.  She did have nonsustained atrial tachycardia is certainly could underlie many of her arrhythmic symptoms.  Given the issues of Cardizem and her lower esophageal central, we will discontinue it.  In the event that she has more symptomatic palpitations, we could either try flecainide and in the interim I will also be looking into beta-blockers used in conjunction with an EpiPen We will undertake a home sleep study given her sleep disordered breathing. Modest pulmonary hypertension was noted.  This occurs in the context of a normal E/E'.  08/24/2017 - 2 months follow up, I have reviewed her 30-day event monitor from March 2019 that showed PACs PVCs and episodes of atrial tachycardia.  Since Dr. Graciela Husbands discontinued Cardizem she has had several episodes of PVCs mostly at night and when she is tired but she tolerates them well.  She would rather not be started on another medication unless necessary.  Significant stressors -taking care of her mother with dementia.  Denies chest pain shortness of breath no claudication has occasional lower extremity edema.  Admits to not being good with low-sodium diet.  Home Medications  Prior to Admission medications   Medication Sig Start Date End Date Taking? Authorizing Provider  albuterol (PROVENTIL HFA;VENTOLIN HFA) 108 (90 BASE) MCG/ACT inhaler Inhale 2 puffs into the lungs every 6 (six) hours as needed.    [provider]  azithromycin (ZITHROMAX) 250 MG tablet 2 tabs po qd x 1 day; 1 tablet per day x 4 days; 06/28/17   Olive Bass, FNP  benzonatate (TESSALON) 100 MG capsule Take 1 capsule (100 mg total) by mouth 3 (three) times daily as needed. 06/28/17   Olive Bass, FNP  bifidobacterium infantis (ALIGN) capsule Take 1 capsule by mouth daily.     [provider]  budesonide-formoterol (SYMBICORT) 160-4.5 MCG/ACT inhaler Inhale 2 puffs into the lungs 2 (two) times daily. 09/14/15   [provider]  Calcium Carbonate-Vitamin D (CALCIUM 600+D) 600-200 MG-UNIT TABS Take 1 tablet by mouth daily.    [provider]  Continuous Blood Gluc Receiver (FREESTYLE LIBRE 14 DAY READER) DEVI USE AS DIRECTED 05/28/17   Wanda Plump, MD  Continuous Blood Gluc Sensor (FREESTYLE LIBRE 14 DAY SENSOR) MISC USE AS DIRECTED PER MD 08/13/17   Wanda Plump, MD  Continuous Blood Gluc Sensor (FREESTYLE LIBRE SENSOR SYSTEM) MISC CHECK BLOOD SUGAR DAILY AS DIRECTED. CHANGE DEVICE EVERY 10 DAYS 09/29/16   Wanda Plump, MD  diclofenac sodium (VOLTAREN) 1 % GEL Apply 2 g topically 4 (four) times daily. 11/02/16   Wanda Plump, MD  diltiazem (CARDIZEM) 30 MG tablet Take 1 tablet (30 mg total) by mouth every 6 (six) hours as needed (for palpitations). Patient not taking: Reported on  06/28/2017 04/19/17   [name], PA  EPINEPHrine (AUVI-Q) 0.3 mg/0.3 mL IJ SOAJ injection Inject 0.3 mg into the muscle as directed.    [provider]  eszopiclone (LUNESTA) 2 MG TABS tablet Take 1 tablet (2 mg total) by mouth at bedtime as needed for up to 1 day for sleep. Take immediately before bedtime 06/27/17 06/28/17  Quintella Reichert, MD  fexofenadine (ALLEGRA) 180 MG tablet Take 180 mg by mouth every morning.     [provider]  fluticasone (FLONASE) 50 MCG/ACT nasal spray Place 2 sprays into the nose daily. AND IF NEEDED 2 SPRAYS AT NIGHT    [provider]  hydrocortisone 2.5 % cream APPLY EXTERNALLY TO THE AFFECTED AREA TWICE DAILY AS NEEDED 01/28/16   Wanda Plump, MD  metFORMIN (GLUCOPHAGE) 1000 MG tablet TAKE 1&1/2 TABLETS BY MOUTH EVERY MORNING AND 1 TABLET EVERY EVENING 06/25/17   Paz, Nolon Rod, MD  montelukast (SINGULAIR) 10 MG tablet Take 10 mg by mouth at bedtime.      [provider]  Multiple Vitamins-Minerals  (MULTIVITAMIN WITH MINERALS) tablet Take 1 tablet by mouth daily.    [provider]  nitrofurantoin, macrocrystal-monohydrate, (MACROBID) 100 MG capsule TAKE AS DIRECTED POST COITAL 01/28/16   Wanda Plump, MD  omeprazole (PRILOSEC) 40 MG capsule Take 1 capsule (40 mg total) by mouth 2 (two) times daily. 06/24/15   Wanda Plump, MD  PRESCRIPTION MEDICATION Allergy Shots    [provider]  rosuvastatin (CRESTOR) 5 MG tablet TAKE 1 TABLET(5 MG) BY MOUTH DAILY 12/22/16   Lars Masson, MD  sitaGLIPtin (JANUVIA) 100 MG tablet Take 1 tablet (100 mg total) by mouth daily. 08/13/17   Wanda Plump, MD  SPIRIVA RESPIMAT 1.25 MCG/ACT AERS INL 2 PFS PO QD 06/23/17   [provider]  Tiotropium Bromide Monohydrate (SPIRIVA RESPIMAT) 2.5 MCG/ACT AERS Inhale 2 puffs into the lungs daily.    [provider]    Family History  Family History  Problem Relation Age of Onset  . Mitral valve prolapse Mother   . Breast cancer Mother 71       M had ca insitu (age 87), aunt   . Dementia Mother   . Heart disease Father 67       early   . Diabetes Father   . Kidney failure Father   . Sleep apnea Father   . Brain cancer Other        2 fam members   . Lung cancer Other        uncle, heavy smoker  . Breast cancer Maternal Aunt 60  . CAD Paternal Grandmother 41  . CAD Paternal Grandfather 84  . Colon cancer Neg Hx     Social History  Social History   Socioeconomic History  . Marital status: Married    Spouse name: Not on file  . Number of children: 1  . Years of education: College  . Highest education level: Not on file  Occupational History  . Occupation: retired principal  Engineer, production  . Financial resource strain: Not on file  . Food insecurity:    Worry: Not on file    Inability: Not on file  . Transportation needs:    Medical: Not on file    Non-medical: Not on file  Tobacco Use  . Smoking status: Never Smoker  . Smokeless tobacco: Never Used    Substance and Sexual Activity  . Alcohol use: No  .  Drug use: No  . Sexual activity: Yes  Lifestyle  . Physical activity:    Days per week: Not on file    Minutes per session: Not on file  . Stress: Not on file  Relationships  . Social connections:    Talks on phone: Not on file    Gets together: Not on file    Attends religious service: Not on file    Active member of club or organization: Not on file    Attends meetings of clubs or organizations: Not on file    Relationship status: Not on file  . Intimate partner violence:    Fear of current or ex partner: Not on file    Emotionally abused: Not on file    Physically abused: Not on file    Forced sexual activity: Not on file  Other Topics Concern  . Not on file  Social History Narrative   Household-- pt and husband    Rare caffeine use   Daughter independent, 1 G-child   Engineer, structural for F and M, both w/ dementia     Review of Systems, as per HPI, otherwise negative General:  No chills, fever, night sweats or weight changes.  Cardiovascular:  No chest pain, dyspnea on exertion, edema, orthopnea, palpitations, paroxysmal nocturnal dyspnea. Dermatological: No rash, lesions/masses Respiratory: No cough, dyspnea Urologic: No hematuria, dysuria Abdominal:   No nausea, vomiting, diarrhea, bright red blood per rectum, melena, or hematemesis Neurologic:  No visual changes, wkns, changes in mental status. All other systems reviewed and are otherwise negative except as noted above.  Physical Exam  Blood pressure 116/80, pulse 89, height 5\' 4"  (1.626 m), weight 190 lb 3.2 oz (86.3 kg), SpO2 99 %.  General: Pleasant, NAD Psych: Normal affect. Neuro: Alert and oriented X 3. Moves all extremities spontaneously. HEENT: Normal  Neck: Supple without bruits or JVD. Lungs:  Resp regular and unlabored, CTA. Heart: RRR no s3, s4, or murmurs. Abdomen: Soft, non-tender, non-distended, BS + x 4.  Extremities: No clubbing, cyanosis,  bilateral mild lower extremity edema. DP/PT/Radials 2+ and equal bilaterally.  Labs:  No results for input(s): CKTOTAL, CKMB, TROPONINI in the last 72 hours. Lab Results  Component Value Date   WBC 5.3 09/26/2016   HGB 12.1 09/26/2016   HCT 37.0 09/26/2016   MCV 84.1 09/26/2016   PLT 272.0 09/26/2016    No results found for: DDIMER Invalid input(s): POCBNP    Component Value Date/Time   NA 140 03/30/2017 0938   K 4.1 03/30/2017 0938   CL 103 03/30/2017 0938   CO2 29 03/30/2017 0938   GLUCOSE 121 (H) 03/30/2017 0938   GLUCOSE 118 08/26/2009   BUN 11 03/30/2017 0938   CREATININE 0.65 03/30/2017 0938   CREATININE 0.93 09/24/2015 1454   CALCIUM 9.5 03/30/2017 0938   PROT 7.2 03/30/2017 0938   ALBUMIN 4.0 03/30/2017 0938   AST 14 03/30/2017 0938   ALT 12 03/30/2017 0938   ALKPHOS 60 03/30/2017 0938   BILITOT 0.4 03/30/2017 0938   GFRNONAA >60 07/11/2016 1226   GFRAA >60 07/11/2016 1226   Lab Results  Component Value Date   CHOL 147 03/30/2017   HDL 65.50 03/30/2017   LDLCALC 68 03/30/2017   TRIG 67.0 03/30/2017    Accessory Clinical Findings  Echocardiogram - 06/2017 - Left ventricle: The cavity size was normal. Wall thickness was   normal. Systolic function was normal. The estimated ejection   fraction was in the range of 55% to 60%.  Wall motion was normal;   there were no regional wall motion abnormalities. Left   ventricular diastolic function parameters were normal. - Pulmonary arteries: Systolic pressure was mildly increased. PA   peak pressure: 41 mm Hg (S).  Impressions:  - Normal LV systolic and diastolic function; mild pulmonary   hypertension.  Stress test: May 2019  Nuclear stress EF: 64%.  Blood pressure demonstrated a normal response to exercise.  There was no ST segment deviation noted during stress.  No T wave inversion was noted during stress.  The study is normal.  This is a low risk study.  Low risk stress nuclear study with  normal perfusion and normal left ventricular regional and global systolic function.  Sleep study: 08/2017 IMPRESSIONS - No significant obstructive sleep apnea occurred during this study (AHI = 3.3/h). - No significant central sleep apnea occurred during this study (CAI = 0.0/h). - no significant oxygen desaturation was noted during this study (Min O2 = 0.0%). - The patient snored with moderate snoring volume. - No cardiac abnormalities were noted during this study. - Clinically significant periodic limb movements did not occur during sleep. No significant associated arousals.  DIAGNOSIS - Normal Study  ECG -not done today.    Assessment & Plan  1.  Palpitations 30-day event monitor showed PACs PVCs and atrial tachycardias.  She is allergic to metoprolol and also has severe asthma so I would avoid that, she was taken off Cardizem now only on as needed Cardizem her symptoms minimal.  In the future if her symptoms get worse will consider flecainide that was also suggested by Dr. Graciela HusbandsKlein She was evaluated for sleep apnea and it was negative.  2.  pulmonary hypertension was noted on her echocardiogram, RVSP 41 mmHg, her left ventricle systolic and diastolic function is normal, this is most probably related to her severe asthma, her RV has normal size and function, for now just regular exercise is recommended, we will repeat echocardiogram in a year and if her hypertension gets worse will refer to CHF clinic.  3.  Hyperlipidemia, on statins that is very tolerated.  4.  Over extremity edema, advised about low-sodium diet, will prescribe Lasix 20 mg p.o. as needed daily.   Follow up in 6 months.  Tobias AlexanderKatarina Kathelyn Gombos, MD, [facility] 08/24/2017, 10:13 AM

## 2017-08-24 ENCOUNTER — Encounter: Payer: Self-pay | Admitting: Cardiology

## 2017-08-24 ENCOUNTER — Ambulatory Visit: Payer: BC Managed Care – PPO | Admitting: Cardiology

## 2017-08-24 VITALS — BP 116/80 | HR 89 | Ht 64.0 in | Wt 190.2 lb

## 2017-08-24 DIAGNOSIS — I4719 Other supraventricular tachycardia: Secondary | ICD-10-CM

## 2017-08-24 DIAGNOSIS — I272 Pulmonary hypertension, unspecified: Secondary | ICD-10-CM

## 2017-08-24 DIAGNOSIS — R600 Localized edema: Secondary | ICD-10-CM

## 2017-08-24 DIAGNOSIS — I471 Supraventricular tachycardia: Secondary | ICD-10-CM

## 2017-08-24 DIAGNOSIS — E785 Hyperlipidemia, unspecified: Secondary | ICD-10-CM | POA: Diagnosis not present

## 2017-08-24 DIAGNOSIS — R002 Palpitations: Secondary | ICD-10-CM

## 2017-08-24 MED ORDER — ROSUVASTATIN CALCIUM 5 MG PO TABS: ORAL_TABLET | ORAL | 3 refills | Status: DC

## 2017-08-24 MED ORDER — FUROSEMIDE 20 MG PO TABS: 20.0000 mg | ORAL_TABLET | Freq: Every day | ORAL | 3 refills | Status: DC | PRN

## 2017-08-24 MED ORDER — DILTIAZEM HCL 30 MG PO TABS: 30.0000 mg | ORAL_TABLET | Freq: Four times a day (QID) | ORAL | 4 refills | Status: DC | PRN

## 2017-08-24 NOTE — Patient Instructions (Addendum)
Medication Instructions:   START TAKING LASIX 20 MG BY MOUTH DAILY AS NEEDED FOR LOWER EXTREMITY SWELLING    Follow-Up:  Your physician wants you to follow-up in: 6 MONTHS WITH DR NELSON You will receive a reminder letter in the mail two months in advance. If you don't receive a letter, please call our office to schedule the follow-up appointment.        If you need a refill on your cardiac medications before your next appointment, please call your pharmacy.   

## 2017-09-12 ENCOUNTER — Ambulatory Visit: Payer: Self-pay | Admitting: *Deleted

## 2017-09-12 NOTE — Telephone Encounter (Addendum)
Spoke w/ Pt- informed she [place] be [place]. She then became very defensive saying she would have to pay out of pocket- I apologized but tried to inform her the importance of being seen. Wanted to know if we could at least send a prescription for hydrocortisone 2% cream- informed I doubt PCP will approve sending it. She then responded "well thanks for nothing", and proceeded to hang up.

## 2017-09-12 NOTE — Telephone Encounter (Signed)
The patient reports symptoms of severe generalized itching and rash.  Chest tightness, wheezing.  She definitely needs to be seen locally.

## 2017-09-12 NOTE — Telephone Encounter (Signed)
[place]  Reports widespread rash, chest, back, neck, shoulders, top of both hands. States she believes it is "an allergic reaction to sun, sun screen and ocean water."  Reports rash is red, hive like at times, other times not raised. Severe itching. Also reports mild chest tightness at times, wheezing last night, not presently, also mild cough, productive at times, unsure of appearence. Pt states this AM she took 60mg  (6 10mg  tabs) of her husbands prednisone which he had RX for a wound infection;states that has been healed for weeks and they brought it along in case they needed it. States prednisone helped ease itching. States she called her allergist but he is out of the country and they will not RX any medications for her. Pt is requesting [facility] be called in "In case she needs it" and 2% hydrocortisone cream. Directed pt to UC/Clinic, declines. States she would have to pay out of pocket and is distrusting of their medical care. Pt made aware NT would relay request to Dr. Drue NovelPaz. Care advise given; [facility] verbalizes understanding. Requesting CB from practice.   IF appropriate RX.:  [facility] [address] [place] [phone]  Pt's # [phone] or [phone]  Reason for Disposition . SEVERE itching (i.e., interferes with sleep, normal activities or school)  Answer Assessment - Initial Assessment Questions 1. APPEARANCE of RASH: "Describe the rash." (e.g., spots, blisters, raised areas, skin peeling, scaly)     Diffuse, at times hive like "When sweating" 2. SIZE: "How big are the spots?" (e.g., tip of pen, eraser, coin; inches, centimeters)     Pea size or smaller, like "Alligator skin." 3. LOCATION: "Where is the rash located?"     Chest, neck, shoulders ,arms, back, tops of hands 4. COLOR: "What color is the rash?" (Note: It is difficult to assess rash color in people with darker-colored skin. When this situation occurs, simply ask the caller to describe what they  see.)     Red 5. ONSET: "When did the rash begin?"     2 days ago, after out in sun all day  6. FEVER: "Do you have a fever?" If so, ask: "What is your temperature, how was it measured, and when did it start?"     Chills but does not feel like has a fever 7. ITCHING: "Does the rash itch?" If so, ask: "How bad is the itch?" (Scale 1-10; or mild, moderate, severe)     severe 8. CAUSE: "What do you think is causing the rash?"     Sun and sun screen, ocean water 9. MEDICATION FACTORS: "Have you started any new medications within the last 2 weeks?" (e.g., antibiotics)      no 10. OTHER SYMPTOMS: "Do you have any other symptoms?" (e.g., dizziness, headache, sore throat, joint pain)       Wheezing at times, not presently, cough at HS, mild chest tightness at times  Protocols used: RASH OR REDNESS - [facility]READ-[facility]

## 2017-09-13 ENCOUNTER — Telehealth: Payer: Self-pay

## 2017-09-13 NOTE — Telephone Encounter (Signed)
Patient voiced that she does not fly back in from vacation until 10:30 PM tomorrow night (09/14/17). An appointment has been scheduled for 09/17/17 at 8:00 AM w/ Dr. Doreene BurkeKremer.

## 2017-09-13 NOTE — Telephone Encounter (Signed)
Karen Barnes [name], Karen Barnes.  I defer to Dr. Leta JunglingPaz's instructions for her to seek care locally.  Otherwise as she is getting home tomorrow she can be seen when she gets home.

## 2017-09-13 NOTE — Telephone Encounter (Signed)
Author phoned pt per Dr. Cyndie Chimeopland's request. Pt. states she is not currently wheezing because she is taking prednisone from an old rx of her husband's, and has been titrating by 10mg  everyday while on vacation. Pt. States she plans to return home 8/9. Pt. Reports occasional sob still, and has used her emergency inhaler once during her beach vacation. Pt reports rash that she believes is sun-related; pt is treating with cortisone cream. Pt. states this rash has happened during previous sun/ocean exposures.    Pt. states she would like to transfer care to [place] site due to convenient location from her home. Earliest appointment at [facility] is for Wednesday 8/14 with Esperanza RichtersEdward Saguier, PA. Per triage notes and comments from Dr. Drue NovelPaz and Dr. Patsy Lageropland, pt. ideally needs to be seen earlier. Request to be seen on Monday 8/12 [facility], and request for transfer routed to Dr. Drue NovelPaz.

## 2017-09-13 NOTE — Telephone Encounter (Signed)
Please advise 

## 2017-09-13 NOTE — Telephone Encounter (Signed)
Copied from CRM [id number]. Topic: Appointment Scheduling - Scheduling Inquiry for Clinic >> Sep 13, 2017 11:27 AM Crist InfanteHarrald, Kathy J wrote: Reason for CRM: pt sees Dr Drue NovelPaz and [place] Pt would like to switch to Dr Dayton MartesAron, if that is ok.  Please advise if ok.

## 2017-09-13 NOTE — Telephone Encounter (Signed)
Pt called again today. Pt want an antibiotic sent to her pharmacy just in case her asthma gets bad. Pt states she will be traveling back home home tomorrow and does not want it to get worse. Pt is not comfortable seeing doctor where she is at. Pt states she will not use the medication if she does not need it. Pt says she would really appreciate it if someone could help her.  Please Advise.

## 2017-09-14 NOTE — Telephone Encounter (Signed)
Okay with me 

## 2017-09-17 ENCOUNTER — Telehealth: Payer: Self-pay | Admitting: Internal Medicine

## 2017-09-17 ENCOUNTER — Ambulatory Visit (INDEPENDENT_AMBULATORY_CARE_PROVIDER_SITE_OTHER): Payer: BC Managed Care – PPO

## 2017-09-17 ENCOUNTER — Ambulatory Visit: Payer: BC Managed Care – PPO | Admitting: Family Medicine

## 2017-09-17 ENCOUNTER — Encounter: Payer: Self-pay | Admitting: Family Medicine

## 2017-09-17 ENCOUNTER — Ambulatory Visit: Payer: Self-pay | Admitting: *Deleted

## 2017-09-17 VITALS — BP 128/80 | HR 98 | Ht 64.0 in | Wt 187.4 lb

## 2017-09-17 DIAGNOSIS — J4541 Moderate persistent asthma with (acute) exacerbation: Secondary | ICD-10-CM

## 2017-09-17 DIAGNOSIS — K219 Gastro-esophageal reflux disease without esophagitis: Secondary | ICD-10-CM

## 2017-09-17 MED ORDER — PREDNISONE 10 MG (21) PO TBPK: ORAL_TABLET | ORAL | 2 refills | Status: DC

## 2017-09-17 MED ORDER — PREDNISONE 10 MG (48) PO TBPK: ORAL_TABLET | ORAL | 0 refills | Status: DC

## 2017-09-17 MED ORDER — PANTOPRAZOLE SODIUM 40 MG PO TBEC: 40.0000 mg | DELAYED_RELEASE_TABLET | Freq: Every day | ORAL | 3 refills | Status: DC

## 2017-09-17 MED ORDER — CLARITHROMYCIN ER 500 MG PO TB24: 1000.0000 mg | ORAL_TABLET | Freq: Every day | ORAL | 0 refills | Status: AC

## 2017-09-17 MED ORDER — METHYLPREDNISOLONE SODIUM SUCC 125 MG IJ SOLR
125.0000 mg | Freq: Once | INTRAMUSCULAR | Status: AC
  Administered 2017-09-17: 125 mg via INTRAMUSCULAR

## 2017-09-17 NOTE — Telephone Encounter (Signed)
Good afteroon Dr. Aron/ Dr. Drue NovelPaz  Mrs. Kathee PoliteBeverly Strang- [birth year] [phone])would like to transfer care to Dr. Dayton MartesAron along with her husband Greggory KeenLarry E. Bellin (TOC) 04/12/1958([id number]).  Dr. Dayton MartesAron they would like to come in the next few weeks as TOC pts and also would like to be seen back to back if possible.    Thanks

## 2017-09-17 NOTE — Telephone Encounter (Signed)
Wrong office- Pt saw Dr. Doreene BurkeKremer.

## 2017-09-17 NOTE — Telephone Encounter (Signed)
Rx corrected & resubmitted to pharmacy.

## 2017-09-17 NOTE — Telephone Encounter (Signed)
Pt called stating that her blood sugar 556 on the finger stick; the the pt was seen in the office today and says that she received a dose of solumedrol in the MD's office today; she states that it was "high" on the [place]; she says that she took an extra 1000 mg of metformin at 1430 and she is pushing water; spoke with Maralyn S[facility]Sarah at [facility]; Maralyn SagoSarah says that the pt should watch her carbohydrate intake, keep an eye on her sugars; she also explained to the pt that since she had the solumedrol her blood sugars may be elevated for the next 24 hours; the pt verbalizes understanding; will route to office for notification of this encounter.    Reason for Disposition . [1] Caller requesting NON-URGENT health information AND [2] PCP's office is the best resource  Answer Assessment - Initial Assessment Questions 1. REASON FOR CALL or QUESTION: "What is your reason for calling today?" or "How can I best help you?" or "What question do you have that I can help answer?"     Pt called with concerns of elevated blood sugar after dose of solumedrol in the MD's office today.  Protocols used: INFORMATION ONLY CALL-A-AH

## 2017-09-17 NOTE — Telephone Encounter (Signed)
Okay with me 

## 2017-09-17 NOTE — Telephone Encounter (Signed)
Routed to Dr. Patsy Lageropland in Dr. Leta JunglingPaz's absence re: prednisone pack quantity.

## 2017-09-17 NOTE — Progress Notes (Signed)
Subjective:  Patient ID: Karen Barnes, female    DOB: [birth year]  Age: 57 y.o. MRN: [id number]  CC: Rash and asthma flare up   HPI Karen Barnes presents for evaluation of a 6-day history of cough with wheezing and productive of yellow phlegm.  Patient has been afebrile without postnasal drip nasal congestion facial pressure teeth pain.  Of significance [place] with a wall unit air conditioner that did not seem to be working properly.  Other people at the resort seem to be sick as well.  She says that she also developed urticaria that seemed to resolve with patient has noted increased inhaler use and nebulizer use.  She did take a prednisone burst weather of [id number].  She said that this seemed to help her survive until she can get back home to see me.  She tells that she also developed urticaria it seemed to resolve with the prednisone burst she also says that her omeprazole 40 mg twice daily is not controlling her reflux.  She asked for a change to a different proton pump inhibitor.  Protonix has worked for her in the past with ER visits.  She admits she is  a diabetic last hemoglobin A1c was 6.3.  Outpatient Medications Prior to Visit  Medication Sig Dispense Refill  . albuterol (PROVENTIL HFA;VENTOLIN HFA) 108 (90 BASE) MCG/ACT inhaler Inhale 2 puffs into the lungs every 6 (six) hours as needed.    . bifidobacterium infantis (ALIGN) capsule Take 1 capsule by mouth daily.    . budesonide-formoterol (SYMBICORT) 160-4.5 MCG/ACT inhaler Inhale 2 puffs into the lungs 2 (two) times daily.    . Calcium Carbonate-Vitamin D (CALCIUM 600+D) 600-200 MG-UNIT TABS Take 1 tablet by mouth daily.    . Continuous Blood Gluc Sensor (FREESTYLE LIBRE 14 DAY SENSOR) MISC USE AS DIRECTED PER MD 2 each 3  . Continuous Blood Gluc Sensor (FREESTYLE LIBRE SENSOR SYSTEM) MISC CHECK BLOOD SUGAR DAILY AS DIRECTED. CHANGE DEVICE EVERY 10 DAYS 3 each 12  . diltiazem (CARDIZEM) 30 MG  tablet Take 1 tablet (30 mg total) by mouth every 6 (six) hours as needed (for palpitations). 30 tablet 4  . EPINEPHrine (AUVI-Q) 0.3 mg/0.3 mL IJ SOAJ injection Inject 0.3 mg into the muscle as directed.    . fexofenadine (ALLEGRA) 180 MG tablet Take 180 mg by mouth every morning.     . fluticasone (FLONASE) 50 MCG/ACT nasal spray Place 2 sprays into the nose daily. AND IF NEEDED 2 SPRAYS AT NIGHT    . furosemide (LASIX) 20 MG tablet Take 1 tablet (20 mg total) by mouth daily as needed for fluid or edema. 30 tablet 3  . hydrocortisone 2.5 % cream APPLY EXTERNALLY TO THE AFFECTED AREA TWICE DAILY AS NEEDED 30 g 0  . metFORMIN (GLUCOPHAGE) 1000 MG tablet TAKE 1&1/2 TABLETS BY MOUTH EVERY MORNING AND 1 TABLET EVERY EVENING 225 tablet 1  . montelukast (SINGULAIR) 10 MG tablet Take 10 mg by mouth at bedtime.      . Multiple Vitamins-Minerals (MULTIVITAMIN WITH MINERALS) tablet Take 1 tablet by mouth daily.    . nitrofurantoin, macrocrystal-monohydrate, (MACROBID) 100 MG capsule TAKE AS DIRECTED POST COITAL 30 capsule 0  . PRESCRIPTION MEDICATION Allergy Shots    . rosuvastatin (CRESTOR) 5 MG tablet TAKE 1 TABLET(5 MG) BY MOUTH DAILY 90 tablet 3  . sitaGLIPtin (JANUVIA) 100 MG tablet Take 1 tablet (100 mg total) by mouth daily. 90 tablet 1  .  SPIRIVA RESPIMAT 1.25 MCG/ACT AERS INL 2 PFS PO QD  5  . omeprazole (PRILOSEC) 40 MG capsule Take 1 capsule (40 mg total) by mouth 2 (two) times daily. 180 capsule 3   No facility-administered medications prior to visit.     ROS Review of Systems  Constitutional: Negative for fatigue and unexpected weight change.  HENT: Negative for congestion, postnasal drip, rhinorrhea, sinus pressure and sinus pain.   Eyes: Negative.   Respiratory: Positive for cough, shortness of breath and wheezing. Negative for chest tightness.   Cardiovascular: Negative for chest pain and palpitations.  Gastrointestinal: Negative.   Endocrine: Negative for polyphagia and polyuria.    Genitourinary: Negative.   Musculoskeletal: Negative for arthralgias and myalgias.  Skin: Positive for rash.  Allergic/Immunologic: Negative for immunocompromised state.  Neurological: Negative for dizziness, weakness, numbness and headaches.  Hematological: Does not bruise/bleed easily.  Psychiatric/Behavioral: Negative.     Objective:  BP 128/80   Pulse 98   Ht 5\' 4"  (1.626 m)   Wt 187 lb 6 oz (85 kg)   SpO2 99%   BMI 32.16 kg/m   BP Readings from Last 3 Encounters:  09/17/17 128/80  08/24/17 116/80  06/28/17 130/76    Wt Readings from Last 3 Encounters:  09/17/17 187 lb 6 oz (85 kg)  08/24/17 190 lb 3.2 oz (86.3 kg)  08/04/17 183 lb (83 kg)    Physical Exam  Constitutional: She is oriented to person, place, and time. She appears well-developed and well-nourished. No distress.  HENT:  Head: Normocephalic and atraumatic.  Right Ear: External ear normal.  Left Ear: External ear normal.  Mouth/Throat: Oropharynx is clear and moist. No oropharyngeal exudate.  Eyes: Pupils are equal, round, and reactive to light. Conjunctivae and EOM are normal. Right eye exhibits no discharge. Left eye exhibits no discharge. No scleral icterus.  Neck: Normal range of motion. Neck supple. No JVD present. No tracheal deviation present. No thyromegaly present.  Cardiovascular: Normal rate, regular rhythm and normal heart sounds.  Pulmonary/Chest: Effort normal. No tachypnea. No respiratory distress. She has decreased breath sounds. She has wheezes. She has rhonchi in the left lower field. She has no rales.  Abdominal: Bowel sounds are normal.  Lymphadenopathy:    She has no cervical adenopathy.  Neurological: She is alert and oriented to person, place, and time.  Skin: Skin is warm and dry. She is not diaphoretic.  Psychiatric: She has a normal mood and affect. Her behavior is normal.    Lab Results  Component Value Date   WBC 5.3 09/26/2016   HGB 12.1 09/26/2016   HCT 37.0  09/26/2016   PLT 272.0 09/26/2016   GLUCOSE 121 (H) 03/30/2017   CHOL 147 03/30/2017   TRIG 67.0 03/30/2017   HDL 65.50 03/30/2017   LDLCALC 68 03/30/2017   ALT 12 03/30/2017   AST 14 03/30/2017   NA 140 03/30/2017   K 4.1 03/30/2017   CL 103 03/30/2017   CREATININE 0.65 03/30/2017   BUN 11 03/30/2017   CO2 29 03/30/2017   TSH 1.07 03/30/2017   HGBA1C 6.4 03/30/2017   MICROALBUR <0.7 03/30/2017    Dg Lumbar Spine Complete  Result Date: 02/01/2017 CLINICAL DATA:  Fall with pain EXAM: LUMBAR SPINE - COMPLETE 4+ VIEW COMPARISON:  None. FINDINGS: Transitional anatomy suspected with 6 non rib-bearing lumbar type vertebra. Lumbarized S1 segment will be designated S1, first non rib-bearing vertebra designated L1. Alignment is within normal limits. Vertebral body heights are normal. Disc spaces  are within normal limits. IMPRESSION: Negative. Electronically Signed   By: Jasmine Pang M.D.   On: 02/01/2017 15:51   Ct Head Wo Contrast  Result Date: 02/01/2017 CLINICAL DATA:  Headache and neck pain after fall. EXAM: CT HEAD WITHOUT CONTRAST CT CERVICAL SPINE WITHOUT CONTRAST TECHNIQUE: Multidetector CT imaging of the head and cervical spine was performed following the standard protocol without intravenous contrast. Multiplanar CT image reconstructions of the cervical spine were also generated. COMPARISON:  MRI cervical spine dated May 04, 2015. CT head dated September 05, 2008. FINDINGS: CT HEAD FINDINGS Brain: There is a thin, 3 mm subdural hematoma overlying the superior left frontal gyrus near the skull vertex (series 4, image 35). No significant mass effect. No additional intracranial hemorrhage. No evidence of acute infarction or hydrocephalus. Vascular: No hyperdense vessel or unexpected calcification. Skull: Normal. Negative for fracture or focal lesion. Sinuses/Orbits: No acute finding. Other: None. CT CERVICAL SPINE FINDINGS Alignment: Slight reversal of the normal cervical lordosis, centered  at C6-C7. No traumatic malalignment Skull base and vertebrae: No acute fracture. No primary bone lesion or focal pathologic process. Soft tissues and spinal canal: No prevertebral fluid or swelling. No visible canal hematoma. Disc levels:  Normal. Upper chest: Negative. Other: None. IMPRESSION: 1. Thin, 3 mm subdural hematoma overlying the superior left frontal gyrus near the skull vertex. No significant mass effect. 2.  No acute cervical spine fracture. Critical Value/emergent results were called by telephone at the time of interpretation on 02/01/2017 at 5:12 pm to Dr. Sherlie Ban, who verbally acknowledged these results. Electronically Signed   By: Obie Dredge M.D.   On: 02/01/2017 17:13   Ct Cervical Spine Wo Contrast  Result Date: 02/01/2017 CLINICAL DATA:  Headache and neck pain after fall. EXAM: CT HEAD WITHOUT CONTRAST CT CERVICAL SPINE WITHOUT CONTRAST TECHNIQUE: Multidetector CT imaging of the head and cervical spine was performed following the standard protocol without intravenous contrast. Multiplanar CT image reconstructions of the cervical spine were also generated. COMPARISON:  MRI cervical spine dated May 04, 2015. CT head dated September 05, 2008. FINDINGS: CT HEAD FINDINGS Brain: There is a thin, 3 mm subdural hematoma overlying the superior left frontal gyrus near the skull vertex (series 4, image 35). No significant mass effect. No additional intracranial hemorrhage. No evidence of acute infarction or hydrocephalus. Vascular: No hyperdense vessel or unexpected calcification. Skull: Normal. Negative for fracture or focal lesion. Sinuses/Orbits: No acute finding. Other: None. CT CERVICAL SPINE FINDINGS Alignment: Slight reversal of the normal cervical lordosis, centered at C6-C7. No traumatic malalignment Skull base and vertebrae: No acute fracture. No primary bone lesion or focal pathologic process. Soft tissues and spinal canal: No prevertebral fluid or swelling. No visible canal  hematoma. Disc levels:  Normal. Upper chest: Negative. Other: None. IMPRESSION: 1. Thin, 3 mm subdural hematoma overlying the superior left frontal gyrus near the skull vertex. No significant mass effect. 2.  No acute cervical spine fracture. Critical Value/emergent results were called by telephone at the time of interpretation on 02/01/2017 at 5:12 pm to Dr. Sherlie Ban, who verbally acknowledged these results. Electronically Signed   By: Obie Dredge M.D.   On: 02/01/2017 17:13   Dg Shoulder Left  Result Date: 02/01/2017 CLINICAL DATA:  57 year old female with a history of left shoulder pain EXAM: LEFT SHOULDER - 2+ VIEW COMPARISON:  None. FINDINGS: No acute displaced fracture. No focal soft tissue swelling. No radiopaque foreign body. Mild degenerative changes of the acromioclavicular joint. Unremarkable proximal humerus. IMPRESSION:  No evidence of acute bony abnormality Electronically Signed   By: Gilmer MorJaime  Wagner D.O.   On: 02/01/2017 15:49   Dg Humerus Left  Result Date: 02/01/2017 CLINICAL DATA:  Fall.  Left upper arm pain. EXAM: LEFT HUMERUS - 2+ VIEW COMPARISON:  None. FINDINGS: Two-view exam of the left humerus shows no evidence for an acute fracture. No worrisome lytic or sclerotic osseous abnormality. Diffuse demineralization noted. IMPRESSION: No evidence for humerus fracture. Elbow is not well evaluated on this film and if there is clinical concern for injury at the elbow, dedicated elbow films recommended. Electronically Signed   By: Kennith CenterEric  Mansell M.D.   On: 02/01/2017 15:50    Assessment & Plan:   Karen Barnes was seen today for rash and asthma flare up.  Diagnoses and all orders for this visit:  Moderate persistent asthmatic bronchitis with acute exacerbation -     methylPREDNISolone sodium succinate (SOLU-MEDROL) 125 mg/2 mL injection 125 mg -     predniSONE (STERAPRED UNI-PAK 21 TAB) 10 MG (21) TBPK tablet; Take one tablet daily -     clarithromycin (BIAXIN XL) 500 MG 24 hr  tablet; Take 2 tablets (1,000 mg total) by mouth daily for 10 days. -     DG Chest 2 View; Future  Gastroesophageal reflux disease, esophagitis presence not specified -     pantoprazole (PROTONIX) 40 MG tablet; Take 1 tablet (40 mg total) by mouth daily.   I have discontinued Karen Barnes's omeprazole. I am also having her start on predniSONE, clarithromycin, and pantoprazole. Additionally, I am having her maintain her fexofenadine, montelukast, fluticasone, albuterol, bifidobacterium infantis, multivitamin with minerals, budesonide-formoterol, PRESCRIPTION MEDICATION, nitrofurantoin (macrocrystal-monohydrate), hydrocortisone, FREESTYLE LIBRE SENSOR SYSTEM, Calcium Carbonate-Vitamin D, EPINEPHrine, metFORMIN, SPIRIVA RESPIMAT, FREESTYLE LIBRE 14 DAY SENSOR, sitaGLIPtin, rosuvastatin, diltiazem, and furosemide. We administered methylPREDNISolone sodium succinate.  Meds ordered this encounter  Medications  . methylPREDNISolone sodium succinate (SOLU-MEDROL) 125 mg/2 mL injection 125 mg  . predniSONE (STERAPRED UNI-PAK 21 TAB) 10 MG (21) TBPK tablet    Sig: Take one tablet daily    Dispense:  30 tablet    Refill:  2  . clarithromycin (BIAXIN XL) 500 MG 24 hr tablet    Sig: Take 2 tablets (1,000 mg total) by mouth daily for 10 days.    Dispense:  20 tablet    Refill:  0  . pantoprazole (PROTONIX) 40 MG tablet    Sig: Take 1 tablet (40 mg total) by mouth daily.    Dispense:  30 tablet    Refill:  3   Patient was given anticipatory guidance on an asthma attack.  Treated with Solu-Medrol today to follow-up with a 12-day Dosepak starting tomorrow.  10 days of Biaxin.  She will follow-up in 3 to 4 days if not improving.  Follow-up: Return in about 3 days (around 09/20/2017).  Mliss SaxWilliam Alfred Tekelia Kareem, MD

## 2017-09-17 NOTE — Patient Instructions (Signed)
Asthma, Acute Bronchospasm °Acute bronchospasm caused by asthma is also referred to as an asthma attack. Bronchospasm means your air passages become narrowed. The narrowing is caused by inflammation and tightening of the muscles in the air tubes (bronchi) in your lungs. This can make it hard to breathe or cause you to wheeze and cough. °What are the causes? °Possible triggers are: °· Animal dander from the skin, hair, or feathers of animals. °· Dust mites contained in house dust. °· Cockroaches. °· Pollen from trees or grass. °· Mold. °· Cigarette or tobacco smoke. °· Air pollutants such as dust, household cleaners, hair sprays, aerosol sprays, paint fumes, strong chemicals, or strong odors. °· Cold air or weather changes. Cold air may trigger inflammation. Winds increase molds and pollens in the air. °· Strong emotions such as crying or laughing hard. °· Stress. °· Certain medicines such as aspirin or beta-blockers. °· Sulfites in foods and drinks, such as dried fruits and wine. °· Infections or inflammatory conditions, such as a flu, cold, or inflammation of the nasal membranes (rhinitis). °· Gastroesophageal reflux disease (GERD). GERD is a condition where stomach acid backs up into your esophagus. °· Exercise or strenuous activity. ° °What are the signs or symptoms? °· Wheezing. °· Excessive coughing, particularly at night. °· Chest tightness. °· Shortness of breath. °How is this diagnosed? °Your health care provider will ask you about your medical history and perform a physical exam. A chest X-ray or blood testing may be performed to look for other causes of your symptoms or other conditions that may have triggered your asthma attack. °How is this treated? °Treatment is aimed at reducing inflammation and opening up the airways in your lungs. Most asthma attacks are treated with inhaled medicines. These include quick relief or rescue medicines (such as bronchodilators) and controller medicines (such as inhaled  corticosteroids). These medicines are sometimes given through an inhaler or a nebulizer. Systemic steroid medicine taken by mouth or given through an IV tube also can be used to reduce the inflammation when an attack is moderate or severe. Antibiotic medicines are only used if a bacterial infection is present. °Follow these instructions at home: °· Rest. °· Drink plenty of liquids. This helps the mucus to remain thin and be easily coughed up. Only use caffeine in moderation and do not use alcohol until you have recovered from your illness. °· Do not smoke. Avoid being exposed to secondhand smoke. °· You play a critical role in keeping yourself in good health. Avoid exposure to things that cause you to wheeze or to have breathing problems. °· Keep your medicines up-to-date and available. Carefully follow your health care provider’s treatment plan. °· Take your medicine exactly as prescribed. °· When pollen or pollution is bad, keep windows closed and use an air conditioner or go to places with air conditioning. °· Asthma requires careful medical care. See your health care provider for a follow-up as advised. If you are more than [redacted] weeks pregnant and you were prescribed any new medicines, let your obstetrician know about the visit and how you are doing. Follow up with your health care provider as directed. °· After you have recovered from your asthma attack, make an appointment with your outpatient doctor to talk about ways to reduce the likelihood of future attacks. If you do not have a doctor who manages your asthma, make an appointment with a primary care doctor to discuss your asthma. °Get help right away if: °· You are getting worse. °·   You have trouble breathing. If severe, call your local emergency services (911 in the [place] °· You develop chest pain or discomfort. °· You are vomiting. °· You are not able to keep fluids down. °· You are coughing up yellow, green, brown, or bloody sputum. °· You have a fever  and your symptoms suddenly get worse. °· You have trouble swallowing. °This information is not intended to replace advice given to you by your health care provider. Make sure you discuss any questions you have with your health care provider. °Document Released: 05/10/2006 Document Revised: 07/07/2015 Document Reviewed: 07/31/2012 °Elsevier Interactive Patient Education © 2017 [employer] ° °

## 2017-09-17 NOTE — Telephone Encounter (Signed)
fyi

## 2017-09-17 NOTE — Telephone Encounter (Signed)
Copied from CRM [id number]. Topic: Quick Communication - Rx Refill/Question >> Sep 17, 2017  9:15 AM Jay SchlichterWeikart, Melissa J wrote: Medication: predniSONE (STERAPRED UNI-PAK 21 TAB) 10 MG (21) TBPK tablet  Has the patient contacted their pharmacy? Yes.   (Agent: If no, request that the patient contact the pharmacy for the refill.) (Agent: If yes, when and what did the pharmacy advise?) [id number] - [place], [place] - [address] Preferred Pharmacy (with phone number or street name):   [facility] called - this rx is a 21 day dose pack and on the bottom is says qty 30 with 2 refills. [facility] needs to know if It is supposed to be dose pack or rx for longer time frame. Cb is [phone]   Agent: Please be advised that RX refills may take up to 3 business days. We ask that you follow-up with your pharmacy.

## 2017-09-18 NOTE — Telephone Encounter (Signed)
Pt scheduled w/ Dr. Dayton MartesAron on 10/24/2017.

## 2017-09-18 NOTE — Telephone Encounter (Signed)
I spoke with Dr. Drue NovelPaz CMA who advised that he is out of the office till the 20th/she stated that he would not mind this patient to transfer care to Dr. Aron/pt is scheduled for 9.18.19 @ 7:15am/thx dmf

## 2017-09-23 ENCOUNTER — Encounter (HOSPITAL_COMMUNITY): Payer: Self-pay | Admitting: *Deleted

## 2017-09-23 ENCOUNTER — Emergency Department (HOSPITAL_COMMUNITY)
Admission: EM | Admit: 2017-09-23 | Discharge: 2017-09-23 | Disposition: A | Discharge status: Home / Self Care | Payer: BC Managed Care – PPO | Attending: Emergency Medicine | Admitting: Emergency Medicine

## 2017-09-23 ENCOUNTER — Emergency Department (HOSPITAL_COMMUNITY): Payer: BC Managed Care – PPO

## 2017-09-23 DIAGNOSIS — J45909 Unspecified asthma, uncomplicated: Secondary | ICD-10-CM | POA: Insufficient documentation

## 2017-09-23 DIAGNOSIS — R197 Diarrhea, unspecified: Secondary | ICD-10-CM | POA: Diagnosis not present

## 2017-09-23 DIAGNOSIS — Z7984 Long term (current) use of oral hypoglycemic drugs: Secondary | ICD-10-CM | POA: Diagnosis not present

## 2017-09-23 DIAGNOSIS — Z79899 Other long term (current) drug therapy: Secondary | ICD-10-CM | POA: Diagnosis not present

## 2017-09-23 DIAGNOSIS — E119 Type 2 diabetes mellitus without complications: Secondary | ICD-10-CM | POA: Diagnosis not present

## 2017-09-23 DIAGNOSIS — R112 Nausea with vomiting, unspecified: Secondary | ICD-10-CM

## 2017-09-23 DIAGNOSIS — R1110 Vomiting, unspecified: Secondary | ICD-10-CM | POA: Diagnosis present

## 2017-09-23 LAB — URINALYSIS, ROUTINE W REFLEX MICROSCOPIC
BILIRUBIN URINE: NEGATIVE
Glucose, UA: NEGATIVE mg/dL
HGB URINE DIPSTICK: NEGATIVE
KETONES UR: NEGATIVE mg/dL
Leukocytes, UA: NEGATIVE
Nitrite: NEGATIVE
PROTEIN: NEGATIVE mg/dL
Specific Gravity, Urine: 1.01 (ref 1.005–1.030)
pH: 6 (ref 5.0–8.0)

## 2017-09-23 LAB — CBC WITH DIFFERENTIAL/PLATELET
Basophils Absolute: 0 10*3/uL (ref 0.0–0.1)
Basophils Relative: 0 %
EOS PCT: 0 %
Eosinophils Absolute: 0 10*3/uL (ref 0.0–0.7)
HCT: 39.6 % (ref 36.0–46.0)
HEMOGLOBIN: 12.6 g/dL (ref 12.0–15.0)
LYMPHS ABS: 1.4 10*3/uL (ref 0.7–4.0)
LYMPHS PCT: 12 %
MCH: 25.8 pg — AB (ref 26.0–34.0)
MCHC: 31.8 g/dL (ref 30.0–36.0)
MCV: 81.1 fL (ref 78.0–100.0)
MONOS PCT: 8 %
Monocytes Absolute: 0.9 10*3/uL (ref 0.1–1.0)
Neutro Abs: 9.5 10*3/uL — ABNORMAL HIGH (ref 1.7–7.7)
Neutrophils Relative %: 80 %
PLATELETS: 349 10*3/uL (ref 150–400)
RBC: 4.88 MIL/uL (ref 3.87–5.11)
RDW: 14.4 % (ref 11.5–15.5)
WBC: 11.7 10*3/uL — AB (ref 4.0–10.5)

## 2017-09-23 LAB — COMPREHENSIVE METABOLIC PANEL
ALK PHOS: 51 U/L (ref 38–126)
ALT: 19 U/L (ref 0–44)
ANION GAP: 16 — AB (ref 5–15)
AST: 20 U/L (ref 15–41)
Albumin: 3.7 g/dL (ref 3.5–5.0)
BUN: 16 mg/dL (ref 6–20)
CALCIUM: 9 mg/dL (ref 8.9–10.3)
CO2: 23 mmol/L (ref 22–32)
CREATININE: 0.76 mg/dL (ref 0.44–1.00)
Chloride: 99 mmol/L (ref 98–111)
Glucose, Bld: 192 mg/dL — ABNORMAL HIGH (ref 70–99)
Potassium: 3.6 mmol/L (ref 3.5–5.1)
Sodium: 138 mmol/L (ref 135–145)
TOTAL PROTEIN: 7 g/dL (ref 6.5–8.1)
Total Bilirubin: 0.5 mg/dL (ref 0.3–1.2)

## 2017-09-23 LAB — LIPASE, BLOOD: LIPASE: 40 U/L (ref 11–51)

## 2017-09-23 MED ORDER — PROMETHAZINE HCL 25 MG/ML IJ SOLN
25.0000 mg | Freq: Four times a day (QID) | INTRAMUSCULAR | Status: DC | PRN
  Administered 2017-09-23: 25 mg via INTRAVENOUS
  Filled 2017-09-23 (×2): qty 1

## 2017-09-23 MED ORDER — PROMETHAZINE HCL 25 MG RE SUPP: 25.0000 mg | Freq: Four times a day (QID) | RECTAL | 0 refills | Status: DC | PRN

## 2017-09-23 MED ORDER — SODIUM CHLORIDE 0.9 % IV BOLUS
1000.0000 mL | Freq: Once | INTRAVENOUS | Status: AC
  Administered 2017-09-23: 1000 mL via INTRAVENOUS

## 2017-09-23 NOTE — ED Provider Notes (Signed)
[facility]-EMERGENCY DEPT Provider Note   CSN: [id number] Arrival date & time: 09/23/17  4696     History   Chief Complaint Chief Complaint  Patient presents with  . Nausea  . Emesis    HPI Karen Barnes is a 57 y.o. female.  57 year old female brought in by EMS from home for vomiting and diarrhea.  Patient has a history of asthma and non-insulin-dependent diabetes.  Patient states that she was in [place] on [place] 7 with her family when she started having a cough and difficulty with her asthma so she took a prednisone pack.  Patient returned home and followed up with her PCP who put her on clarithromycin, gave an injection of Solu-Medrol and continued steroids, chest x-ray was negative.  Patient followed up with her allergist who added Omnicef on top of the clarithromycin with additional prednisone.  Patient states that her blood sugar has been fluctuating in the 200-300s, began vomiting yesterday morning, ongoing vomiting this morning as well as an episode of diarrhea today.  Patient became concerned when she used her ketone strips and noticed trace ketones in her urine, she has been hydrating with water and Gatorade and most recent test strip was clear for ketones.  Denies fevers, chills, abdominal pain, difficulty breathing.  No other complaints or concerns.     Past Medical History:  Diagnosis Date  . Asthma, moderate persistent    sees Dr Irena Cords  . Diabetes mellitus    A1C 6.2---->  2009  . Family history of adverse reaction to anesthesia    sister had cardiac arrest with rhinoplasty  . Fibrocystic breast   . GERD (gastroesophageal reflux disease)   . HH (hiatus hernia) 09/2014   Dr. Ewing Schlein  . History of echocardiogram    Echo 5/19: EF 55-60, normal wall motion, PASP 41  . History of hiatal hernia   . History of nuclear stress test    Nuclear stress test 5/19:  EF 64, normal perfusion, Low risk  . Hot flashes   . Hyperlipidemia   . Normal  nuclear stress test 2006  . Palpitations    PACs sees cards   . Recurrent cystitis    took  postcoital antibiotic for years, symptoms better after her hysterectomy 1996, symptoms re-surface in 2011  . RUQ abdominal pain 2016   EGD showed small HH, otherwise normal  . Wears glasses     Patient Active Problem List   Diagnosis Date Noted  . Edema of both ankles 02/03/2016  . 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 OSA (obstructive sleep apnea) 02/03/2016  . Solitary pulmonary nodule 11/03/2015  . Cough 11/03/2015  . PCP NOTES >>>>>>>>>>>>>>>>>>>>>>>>>>>>>>>>>>> 03/26/2015  . Annual physical exam 01/10/2013  . Family history of breast cancer 02/23/2012  . At risk for coronary artery disease 02/07/2012  . Obesity 12/13/2010  . Heart palpitations 12/13/2010  . Elevated blood pressure reading 12/13/2010  . Hyperlipidemia 03/07/2007  . [facility] 03/07/2007  . CYSTITIS, RECURRENT 03/07/2007  . DMII (diabetes mellitus, type 2) (HCC) 03/07/2007  . Asthmatic bronchitis 07/12/2006  . GERD 07/12/2006    Past Surgical History:  Procedure Laterality Date  . ABDOMINAL HYSTERECTOMY  1996   no oophorectomy, d/tendometriosis and fibroids approximately 1996  . BREAST EXCISIONAL BIOPSY Right 03/12/2014   [facility]  . BREAST LUMPECTOMY WITH RADIOACTIVE SEED LOCALIZATION Right 03/12/2014   Procedure: BREAST LUMPECTOMY WITH RADIOACTIVE SEED LOCALIZATION;  Surgeon: Harriette Bouillon, MD;  Location: [facility];  Service:  General;  Laterality: Right;  . BREAST SURGERY     Lumpectomy-- BENIGN   . CATARACT EXTRACTION Bilateral 2011  . CESAREAN SECTION    . COSMETIC SURGERY  01/2017   [facility] Touch w/ GYN  . DILATION AND CURETTAGE OF UTERUS    . LIPOMA EXCISION  1978  . UPPER GASTROINTESTINAL ENDOSCOPY    . UPPER GI ENDOSCOPY  09/11/2014   Small Hiatus Hernia, Dr. Ewing SchleinMagod     OB History   None      Home Medications    Prior to Admission medications   Medication Sig Start Date End Date Taking?  Authorizing Provider  albuterol (PROVENTIL HFA;VENTOLIN HFA) 108 (90 BASE) MCG/ACT inhaler Inhale 2 puffs into the lungs every 6 (six) hours as needed.    [provider]  bifidobacterium infantis (ALIGN) capsule Take 1 capsule by mouth daily.    [provider]  budesonide-formoterol (SYMBICORT) 160-4.5 MCG/ACT inhaler Inhale 2 puffs into the lungs 2 (two) times daily. 09/14/15   [provider]  Calcium Carbonate-Vitamin D (CALCIUM 600+D) 600-200 MG-UNIT TABS Take 1 tablet by mouth daily.    [provider]  clarithromycin (BIAXIN XL) 500 MG 24 hr tablet Take 2 tablets (1,000 mg total) by mouth daily for 10 days. 09/17/17 09/27/17  Mliss SaxKremer, William Alfred, MD  Continuous Blood Gluc Sensor (FREESTYLE LIBRE 14 DAY SENSOR) MISC USE AS DIRECTED PER MD 08/13/17   Wanda PlumpPaz, Jose E, MD  Continuous Blood Gluc Sensor (FREESTYLE LIBRE SENSOR SYSTEM) MISC CHECK BLOOD SUGAR DAILY AS DIRECTED. CHANGE DEVICE EVERY 10 DAYS 09/29/16   Wanda PlumpPaz, Jose E, MD  diltiazem (CARDIZEM) 30 MG tablet Take 1 tablet (30 mg total) by mouth every 6 (six) hours as needed (for palpitations). 08/24/17   Lars MassonNelson, Katarina H, MD  EPINEPHrine (AUVI-Q) 0.3 mg/0.3 mL IJ SOAJ injection Inject 0.3 mg into the muscle as directed.    [provider]  fexofenadine (ALLEGRA) 180 MG tablet Take 180 mg by mouth every morning.     [provider]  fluticasone (FLONASE) 50 MCG/ACT nasal spray Place 2 sprays into the nose daily. AND IF NEEDED 2 SPRAYS AT NIGHT    [provider]  furosemide (LASIX) 20 MG tablet Take 1 tablet (20 mg total) by mouth daily as needed for fluid or edema. 08/24/17 11/22/17  Lars MassonNelson, Katarina H, MD  hydrocortisone 2.5 % cream APPLY EXTERNALLY TO THE AFFECTED AREA TWICE DAILY AS NEEDED 01/28/16   Wanda PlumpPaz, Jose E, MD  metFORMIN (GLUCOPHAGE) 1000 MG tablet TAKE 1&1/2 TABLETS BY MOUTH EVERY MORNING AND 1 TABLET EVERY EVENING 06/25/17   Wanda PlumpPaz, Jose E, MD  montelukast (SINGULAIR) 10 MG tablet  Take 10 mg by mouth at bedtime.      [provider]  Multiple Vitamins-Minerals (MULTIVITAMIN WITH MINERALS) tablet Take 1 tablet by mouth daily.    [provider]  nitrofurantoin, macrocrystal-monohydrate, (MACROBID) 100 MG capsule TAKE AS DIRECTED POST COITAL 01/28/16   Wanda PlumpPaz, Jose E, MD  pantoprazole (PROTONIX) 40 MG tablet Take 1 tablet (40 mg total) by mouth daily. 09/17/17   Mliss SaxKremer, William Alfred, MD  predniSONE (STERAPRED UNI-PAK 48 TAB) 10 MG (48) TBPK tablet See package insert for directions. 09/17/17   Mliss SaxKremer, William Alfred, MD  PRESCRIPTION MEDICATION Allergy Shots    [provider]  rosuvastatin (CRESTOR) 5 MG tablet TAKE 1 TABLET(5 MG) BY MOUTH DAILY 08/24/17   Lars MassonNelson, Katarina H, MD  sitaGLIPtin (JANUVIA) 100 MG tablet Take 1 tablet (100 mg total) by  mouth daily. 08/13/17   Wanda PlumpPaz, Jose E, MD  SPIRIVA RESPIMAT 1.25 MCG/ACT AERS INL 2 PFS PO QD 06/23/17   [provider]    Family History Family History  Problem Relation Age of Onset  . Mitral valve prolapse Mother   . Breast cancer Mother [id number]       M had ca insitu (age 860), aunt   . Dementia Mother   . Heart disease Father 5040       early   . Diabetes Father   . Kidney failure Father   . Sleep apnea Father   . Brain cancer Other        2 fam members   . Lung cancer Other        uncle, heavy smoker  . Breast cancer Maternal Aunt 60  . CAD Paternal Grandmother 7940  . CAD Paternal Grandfather 2440  . Colon cancer Neg Hx     Social History Social History   Tobacco Use  . Smoking status: Never Smoker  . Smokeless tobacco: Never Used  Substance Use Topics  . Alcohol use: No  . Drug use: No     Allergies   Butorphanol tartrate; Hydrocodone; Roxicet [oxycodone-acetaminophen]; Penicillins; and Zofran [ondansetron hcl]   Review of Systems Review of Systems  Constitutional: Negative for chills, diaphoresis and fever.  HENT: Negative for congestion.   Respiratory: Positive for cough.  Negative for chest tightness, shortness of breath and wheezing.   Cardiovascular: Negative for chest pain.  Gastrointestinal: Positive for diarrhea, nausea and vomiting. Negative for abdominal distention, abdominal pain, blood in stool and constipation.  Genitourinary: Negative for difficulty urinating and dysuria.  Musculoskeletal: Negative for arthralgias and myalgias.  Skin: Negative for rash and wound.  Allergic/Immunologic: Positive for immunocompromised state.  Neurological: Positive for headaches. Negative for weakness.  Hematological: Negative for adenopathy. Does not bruise/bleed easily.  Psychiatric/Behavioral: Negative for confusion.  All other systems reviewed and are negative.    Physical Exam Updated Vital Signs BP (!) 156/81 (BP Location: Left Arm)   Pulse 81   Temp 98.2 F (36.8 C) (Oral)   Resp 18   SpO2 98%   Physical Exam  Constitutional: She is oriented to person, place, and time. She appears well-developed and well-nourished. No distress.  HENT:  Head: Normocephalic and atraumatic.  Right Ear: External ear normal.  Left Ear: External ear normal.  Nose: Nose normal.  Mouth/Throat: Oropharynx is clear and moist. Mucous membranes are dry. No oropharyngeal exudate.  Eyes: Conjunctivae are normal.  Neck: Neck supple.  Cardiovascular: Normal rate, regular rhythm, normal heart sounds and intact distal pulses.  No murmur heard. Pulmonary/Chest: Effort normal and breath sounds normal. No respiratory distress.  Abdominal: Soft. She exhibits no distension. There is no tenderness.  Lymphadenopathy:    She has no cervical adenopathy.  Neurological: She is alert and oriented to person, place, and time.  Skin: Skin is warm and dry. She is not diaphoretic.  Psychiatric: She has a normal mood and affect. Her behavior is normal.  Nursing note and vitals reviewed.    ED Treatments / Results  Labs (all labs ordered are listed, but only abnormal results are  displayed) Labs Reviewed  CBC WITH DIFFERENTIAL/PLATELET  COMPREHENSIVE METABOLIC PANEL  LIPASE, BLOOD  URINALYSIS, ROUTINE W REFLEX MICROSCOPIC    EKG None  Radiology No results found.  Procedures Procedures (including critical care time)  Medications Ordered in ED Medications  sodium chloride 0.9 % bolus 1,000 mL (has no administration  in time range)  promethazine (PHENERGAN) injection 25 mg (has no administration in time range)     Initial Impression / Assessment and Plan / ED Course  I have reviewed the triage vital signs and the nursing notes.  Pertinent labs & imaging results that were available during my care of the patient were reviewed by me and considered in my medical decision making (see chart for details).  Clinical Course as of Sep 24 1327  Sun Sep 23, 2017  2247 57 year old female with history of asthma and non-insulin-dependent diabetes presents with complaint of vomiting and diarrhea.  Patient is on prednisone as well as 2 different antibiotics due to a recent asthma flare.  Patient states antibiotics are being used to treat "bronchospasm."  She denies fevers, chills, sinus pain and pressure.  On exam lung sounds are clear, patient has emesis bag with nonbilious emesis present.  Denies blood in her emesis or stools.  Abdomen is soft and nontender.  Patient was given IV fluids as well as Phenergan (states Zofran makes her hair fall out) and a p.o. challenge.  Patient is able to tolerate p.o. liquids.  Recommend she discontinue use of the antibiotics, no known diagnosis of bacterial infection and she has now developed vomiting and diarrhea.  Patient will call her allergist in the morning to discuss whether or not to continue on the rest of her prednisone taper.  Urinalysis is normal, CBC unremarkable, lipase normal, CMP with elevated glucose at 192, slight increase in gap at 16 with a normal bicarb.  She is feeling much better and is ready for discharge home.   [LM]     Clinical Course User Index [LM] Jeannie Fend, PA-C    Final Clinical Impressions(s) / ED Diagnoses   Final diagnoses:  None    ED Discharge Orders    None       Alden Hipp 09/23/17 1329    Linwood Dibbles, MD 09/24/17 2202

## 2017-09-23 NOTE — ED Triage Notes (Signed)
Pt is from home.  She is complaining of N/V/D. She was diagnosed with bronchitis on 09-17-17, prescribed predinose on 8-12.  States she has not been able to regulate her CBG, states it has been reading high.  States its been around 250.   N/V x 1 day and diarrhea started today.  Patient denies any OTC medication for symptoms.    EMS reported CBG of 204.

## 2017-09-23 NOTE — ED Notes (Signed)
Pt given diet coke. 

## 2017-09-23 NOTE — Discharge Instructions (Addendum)
Discontinue antibiotics.  Continue with prednisone, monitor your blood sugar.  Contact your allergist office in the morning to discuss discontinuing or continuing with the prednisone as planned. Return to ER for worsening or concerning symptoms.

## 2017-09-23 NOTE — ED Notes (Signed)
Bed: WA03 Expected date:  Expected time:  Means of arrival:  Comments: 57 yo bronchitis, N/V

## 2017-10-03 ENCOUNTER — Ambulatory Visit: Payer: BC Managed Care – PPO | Admitting: Family Medicine

## 2017-10-03 ENCOUNTER — Ambulatory Visit (INDEPENDENT_AMBULATORY_CARE_PROVIDER_SITE_OTHER): Payer: BC Managed Care – PPO

## 2017-10-03 ENCOUNTER — Encounter: Payer: Self-pay | Admitting: Family Medicine

## 2017-10-03 VITALS — BP 134/80 | HR 93 | Temp 98.6°F | Ht 64.0 in | Wt 179.2 lb

## 2017-10-03 DIAGNOSIS — R197 Diarrhea, unspecified: Secondary | ICD-10-CM

## 2017-10-03 DIAGNOSIS — E119 Type 2 diabetes mellitus without complications: Secondary | ICD-10-CM | POA: Diagnosis not present

## 2017-10-03 DIAGNOSIS — Z09 Encounter for follow-up examination after completed treatment for conditions other than malignant neoplasm: Secondary | ICD-10-CM | POA: Diagnosis not present

## 2017-10-03 DIAGNOSIS — R112 Nausea with vomiting, unspecified: Secondary | ICD-10-CM | POA: Diagnosis not present

## 2017-10-03 DIAGNOSIS — J45901 Unspecified asthma with (acute) exacerbation: Secondary | ICD-10-CM

## 2017-10-03 DIAGNOSIS — N760 Acute vaginitis: Secondary | ICD-10-CM | POA: Insufficient documentation

## 2017-10-03 DIAGNOSIS — N761 Subacute and chronic vaginitis: Secondary | ICD-10-CM

## 2017-10-03 LAB — COMPREHENSIVE METABOLIC PANEL
ALT: 18 U/L (ref 0–35)
AST: 12 U/L (ref 0–37)
Albumin: 4.3 g/dL (ref 3.5–5.2)
Alkaline Phosphatase: 54 U/L (ref 39–117)
BUN: 20 mg/dL (ref 6–23)
CHLORIDE: 96 meq/L (ref 96–112)
CO2: 26 mEq/L (ref 19–32)
Calcium: 9.8 mg/dL (ref 8.4–10.5)
Creatinine, Ser: 0.87 mg/dL (ref 0.40–1.20)
GFR: 70.83 mL/min (ref >60.00)
GLUCOSE: 190 mg/dL — AB (ref 70–99)
POTASSIUM: 3.9 meq/L (ref 3.5–5.1)
SODIUM: 135 meq/L (ref 135–145)
Total Bilirubin: 0.5 mg/dL (ref 0.2–1.2)
Total Protein: 7.6 g/dL (ref 6.0–8.3)

## 2017-10-03 LAB — HEMOGLOBIN A1C: HEMOGLOBIN A1C: 7.7 % — AB (ref 4.6–6.5)

## 2017-10-03 LAB — LIPID PANEL
Cholesterol: 166 mg/dL (ref 0–200)
HDL: 75.3 mg/dL (ref >39.00)
LDL CALC: 75 mg/dL (ref 0–99)
NONHDL: 90.74
Total CHOL/HDL Ratio: 2
Triglycerides: 78 mg/dL (ref 0.0–149.0)
VLDL: 15.6 mg/dL (ref 0.0–40.0)

## 2017-10-03 LAB — LIPASE: Lipase: 15 U/L (ref 11.0–59.0)

## 2017-10-03 LAB — TSH: TSH: 0.31 u[IU]/mL — AB (ref 0.35–4.50)

## 2017-10-03 MED ORDER — ONDANSETRON HCL 4 MG PO TABS: 4.0000 mg | ORAL_TABLET | Freq: Three times a day (TID) | ORAL | 0 refills | Status: DC | PRN

## 2017-10-03 MED ORDER — FLUCONAZOLE 150 MG PO TABS: 150.0000 mg | ORAL_TABLET | Freq: Once | ORAL | 1 refills | Status: AC

## 2017-10-03 NOTE — Assessment & Plan Note (Addendum)
Deteriorated acutely likely due to steroid use and to a lesser extent,  acute illness. Was well controlled when a1c checked in February. Will repeat a1c today.  eRx sent for zofran and I advised her to take this in the short term prior to taking her medication if she is nauseated to keep her blood sugars down (she has been vomiting up her medication or too nauseated to take it). NO changes made to her diabetes rxs today.  Lab Results  Component Value Date   HGBA1C 6.4 03/30/2017

## 2017-10-03 NOTE — Progress Notes (Signed)
Subjective:   Patient ID: Karen Barnes, female    DOB: 12-27-1960, 58 y.o.   MRN: [id number]  Karen Barnes is a pleasant 56 y.o. year old female who presents to clinic today with Transfer of Care (Patient is here today to transfer care and establish care with Dr. Dayton Martes.  Was seen at [facility] on 8.18.19 for N&V with Diarrhea and given Phenergan supp 1q6h prn.  She had already seen Dr. Doreene Burke and given an injection of Solumedrol (BS was 556 that afternoon) and given Clarithromyacin and Prednisone dose pack. Saw Allergist on 15th, 23rd, and 27th.  Was given additional abx Ceftinir and hospital asked her to D/C all abx and continue prednisone.  Allergist yesterday advised pt to start Levaquin 750mg  1qd x5d ); addendum (and Prednisone dose pack due to asthma exacerbation.  Feels that the hospital did not listen to her and that could have been avoided.  Today when she got up her FBS was 445 then she pushed H2O and took meds then by the time she was leaving to come today her FBS came down to 163.  She felt like she was going to vomit, slight diarrhea and discomfort.  Took a Zofran of her husbands.  She feels that she cannot keep her medications down unless she has that.  FBS just now was 167.  She has an order from ); and addendum (her allergist for a CXR and felt that it should be done here as we have a previous one on file for comparison and he heard crackling in her lungs.  She needs to know what to do if her BS goes over 300 etc. She would like an Rx for Zofran so that she can still go on her trip to [place] as well as an Rx for Diflucan in case she develops a yeast inf due to all the abx.  Prednisone will end on 9.9.19.)  on 10/03/2017  HPI: Patient is new to me. Chart reviewed.  ER follow up-  Was seen at [facility] ED on 09/23/17 for nausea and vomiting. Note reviewed.  She was brought to ED via EMS from home for vomiting and diarrhea.  She has a history of asthma and non-insulin-dependent diabetes.    Patient  states that she was in [place] on [place] 7 with her family when she started having a cough and difficulty with her asthma so she took a prednisone pack.  Patient returned home and followed up with Dr. Doreene Burke on 09/17/17 for acute asthma exacerbation.  Note reviewed.   He placed her on clarithromycin, gave an injection of Solu-Medrol and continued steroids, chest x-ray was negative.  Patient followed up with her allergist who added Omnicef on top of the clarithromycin with additional prednisone.  She began vomiting the day prior to going to the ER along with one episode of diarrhea.   She became concerned when her ketone strips showed trace ketones in her urine.  She denied evers, chills, abdominal pain, difficulty breathing.    In the ER, she was given IV fluids and phenergan.  She was then able to tolerate po liquids.  It was recommended that she d/c all antibiotics.  When she saw her allergist yesterday, he placed her on Levaquin 750 mg daily x 5 days.  According to patient, he heard wheezes on exam and felt like she needed to have a CXR done here.  She will be finished with prednisone dose pack on 10/15/17.   UA neg, CBC,  lipase unremarkable. CMET unremarkable other than a glucose of 192  CXR neg  Dg Chest 2 View  Result Date: 09/23/2017 CLINICAL DATA:  Nausea, vomiting and diarrhea. EXAM: CHEST - 2 VIEW COMPARISON:  09/17/2017 FINDINGS: Lungs are hypoinflated and otherwise clear. Cardiomediastinal silhouette and remainder of the exam is unchanged. IMPRESSION: Hypoinflation without acute cardiopulmonary disease. Electronically Signed   By: Elberta Fortis M.D.   On: 09/23/2017 11:03   Dg Chest 2 View  Result Date: 09/17/2017 CLINICAL DATA:  Moderate persistent asthmatic bronchitis with acute exacerbation. Cough EXAM: CHEST - 2 VIEW COMPARISON:  07/11/2016 FINDINGS: The heart size and mediastinal contours are within normal limits. Both lungs are clear. The visualized skeletal structures are  unremarkable. IMPRESSION: No active cardiopulmonary disease. Electronically Signed   By: Marlan Palau M.D.   On: 09/17/2017 08:46    Diabetes-  Patient states that her blood sugar has been fluctuating in the 200-400s.  This morning her FSBS was 445 and it came down to 163 after she took her medication.  She felt like she was going to vomiting, slight diarrhea as well. Took zofran that her husband had.  Currently taking Metformin 1500 mg mg daily with breakfast and 1000 mg nightly with supper along with Januvia 100 mg daily. Lab Results  Component Value Date   HGBA1C 6.4 03/30/2017    Current Outpatient Medications on File Prior to Visit  Medication Sig Dispense Refill  . albuterol (PROVENTIL HFA;VENTOLIN HFA) 108 (90 BASE) MCG/ACT inhaler Inhale 2 puffs into the lungs every 6 (six) hours as needed.    . bifidobacterium infantis (ALIGN) capsule Take 1 capsule by mouth daily.    . budesonide-formoterol (SYMBICORT) 160-4.5 MCG/ACT inhaler Inhale 2 puffs into the lungs 2 (two) times daily.    . Calcium Carbonate-Vitamin D (CALCIUM 600+D) 600-200 MG-UNIT TABS Take 1 tablet by mouth daily.    . Continuous Blood Gluc Sensor (FREESTYLE LIBRE 14 DAY SENSOR) MISC USE AS DIRECTED PER MD 2 each 3  . Continuous Blood Gluc Sensor (FREESTYLE LIBRE SENSOR SYSTEM) MISC CHECK BLOOD SUGAR DAILY AS DIRECTED. CHANGE DEVICE EVERY 10 DAYS 3 each 12  . diltiazem (CARDIZEM) 30 MG tablet Take 1 tablet (30 mg total) by mouth every 6 (six) hours as needed (for palpitations). 30 tablet 4  . EPINEPHrine (AUVI-Q) 0.3 mg/0.3 mL IJ SOAJ injection Inject 0.3 mg into the muscle as directed.    . fexofenadine (ALLEGRA) 180 MG tablet Take 180 mg by mouth every morning.     . fluticasone (FLONASE) 50 MCG/ACT nasal spray Place 2 sprays into the nose daily. AND IF NEEDED 2 SPRAYS AT NIGHT    . furosemide (LASIX) 20 MG tablet Take 1 tablet (20 mg total) by mouth daily as needed for fluid or edema. 30 tablet 3  . hydrocortisone  2.5 % cream APPLY EXTERNALLY TO THE AFFECTED AREA TWICE DAILY AS NEEDED 30 g 0  . levofloxacin (LEVAQUIN) 750 MG tablet Take 750 mg by mouth daily.    . metFORMIN (GLUCOPHAGE) 1000 MG tablet TAKE 1&1/2 TABLETS BY MOUTH EVERY MORNING AND 1 TABLET EVERY EVENING (Patient taking differently: TAKE 1&1/2 (1500mg ) TABLETS BY MOUTH EVERY MORNING AND 1 TABLET (1000mg ) EVERY EVENING) 225 tablet 1  . montelukast (SINGULAIR) 10 MG tablet Take 10 mg by mouth at bedtime.      . Multiple Vitamins-Minerals (MULTIVITAMIN WITH MINERALS) tablet Take 1 tablet by mouth daily.    . nitrofurantoin, macrocrystal-monohydrate, (MACROBID) 100 MG capsule TAKE AS DIRECTED POST COITAL  30 capsule 0  . pantoprazole (PROTONIX) 40 MG tablet Take 1 tablet (40 mg total) by mouth daily. 30 tablet 3  . predniSONE (STERAPRED UNI-PAK 48 TAB) 10 MG (48) TBPK tablet See package insert for directions. (Patient taking differently: See package insert for directions. Ends on 9.7.919) 48 tablet 0  . PRESCRIPTION MEDICATION Allergy Shots    . promethazine (PHENERGAN) 25 MG suppository Place 1 suppository (25 mg total) rectally every 6 (six) hours as needed for nausea or vomiting. 12 each 0  . rosuvastatin (CRESTOR) 5 MG tablet TAKE 1 TABLET(5 MG) BY MOUTH DAILY 90 tablet 3  . sitaGLIPtin (JANUVIA) 100 MG tablet Take 1 tablet (100 mg total) by mouth daily. 90 tablet 1  . SPIRIVA RESPIMAT 1.25 MCG/ACT AERS Inhale 2 puffs into the lungs daily.   5   No current facility-administered medications on file prior to visit.     Allergies  Allergen Reactions  . Butorphanol Tartrate Other (See Comments)    REACTION: hallucinations  . Hydrocodone Other (See Comments)    HALLUCINATIONS  . Roxicet [Oxycodone-Acetaminophen] Other (See Comments)    HALLUCINATIONS  . Penicillins Hives    Occurred at age 60/CHILDHOOD ALLERGY Has patient had a PCN reaction causing immediate rash, facial/tongue/throat swelling, SOB or lightheadedness with hypotension:  Unknown Has patient had a PCN reaction causing severe rash involving mucus membranes or skin necrosis: Unknown Has patient had a PCN reaction that required hospitalization: Unknown Has patient had a PCN reaction occurring within the last 10 years: No If all of the above answers are "NO", then may proceed with Cephalosporin use.     Past Medical History:  Diagnosis Date  . Asthma, moderate persistent    sees Dr Irena Cords  . Diabetes mellitus    A1C 6.2---->  2009  . Family history of adverse reaction to anesthesia    sister had cardiac arrest with rhinoplasty  . Fibrocystic breast   . GERD (gastroesophageal reflux disease)   . HH (hiatus hernia) 09/2014   Dr. Ewing Schlein  . History of echocardiogram    Echo 5/19: EF 55-60, normal wall motion, PASP 41  . History of hiatal hernia   . History of nuclear stress test    Nuclear stress test 5/19:  EF 64, normal perfusion, Low risk  . Hot flashes   . Hyperlipidemia   . Normal nuclear stress test 2006  . Palpitations    PACs sees cards   . Recurrent cystitis    took  postcoital antibiotic for years, symptoms better after her hysterectomy 1996, symptoms re-surface in 2011  . RUQ abdominal pain 2016   EGD showed small HH, otherwise normal  . Wears glasses     Past Surgical History:  Procedure Laterality Date  . ABDOMINAL HYSTERECTOMY  1996   no oophorectomy, d/tendometriosis and fibroids approximately 1996  . BREAST EXCISIONAL BIOPSY Right 03/12/2014   [facility]  . BREAST LUMPECTOMY WITH RADIOACTIVE SEED LOCALIZATION Right 03/12/2014   Procedure: BREAST LUMPECTOMY WITH RADIOACTIVE SEED LOCALIZATION;  Surgeon: Harriette Bouillon, MD;  Location: [facility];  Service: General;  Laterality: Right;  . BREAST SURGERY     Lumpectomy-- BENIGN   . CATARACT EXTRACTION Bilateral 2011  . CESAREAN SECTION    . COSMETIC SURGERY  01/2017   [facility] Touch w/ GYN  . DILATION AND CURETTAGE OF UTERUS    . LIPOMA EXCISION  1978  . UPPER  GASTROINTESTINAL ENDOSCOPY    . UPPER GI ENDOSCOPY  09/11/2014  Small Hiatus Hernia, Dr. Ewing Schlein    Family History  Problem Relation Age of Onset  . Mitral valve prolapse Mother   . Breast cancer Mother 72       M had ca insitu (age 76), aunt   . Dementia Mother   . Heart disease Father 46       early   . Diabetes Father   . Kidney failure Father   . Sleep apnea Father   . Brain cancer Other        2 fam members   . Lung cancer Other        uncle, heavy smoker  . Breast cancer Maternal Aunt 60  . CAD Paternal Grandmother 63  . CAD Paternal Grandfather 74  . Colon cancer Neg Hx     Social History   Socioeconomic History  . Marital status: Married    Spouse name: Not on file  . Number of children: 1  . Years of education: College  . Highest education level: Not on file  Occupational History  . Occupation: retired principal  Engineer, production  . Financial resource strain: Not on file  . Food insecurity:    Worry: Not on file    Inability: Not on file  . Transportation needs:    Medical: Not on file    Non-medical: Not on file  Tobacco Use  . Smoking status: Never Smoker  . Smokeless tobacco: Never Used  Substance and Sexual Activity  . Alcohol use: No  . Drug use: No  . Sexual activity: Yes  Lifestyle  . Physical activity:    Days per week: Not on file    Minutes per session: Not on file  . Stress: Not on file  Relationships  . Social connections:    Talks on phone: Not on file    Gets together: Not on file    Attends religious service: Not on file    Active member of club or organization: Not on file    Attends meetings of clubs or organizations: Not on file    Relationship status: Not on file  . Intimate partner violence:    Fear of current or ex partner: Not on file    Emotionally abused: Not on file    Physically abused: Not on file    Forced sexual activity: Not on file  Other Topics Concern  . Not on file  Social History Narrative   Household-- pt  and husband    Rare caffeine use   Daughter independent, 1 G-child   Engineer, structural for F and M, both w/ dementia   The PMH, PSH, Social History, Family History, Medications, and allergies have been reviewed in [facility], and have been updated if relevant.  Review of Systems  Constitutional: Negative.   HENT: Negative.   Eyes: Negative.   Respiratory: Positive for cough and wheezing.   Cardiovascular: Negative.   Gastrointestinal: Positive for abdominal pain, diarrhea and nausea. Negative for blood in stool, constipation, melena and vomiting.  Genitourinary: Negative.   Musculoskeletal: Negative.   Skin: Negative.   Neurological: Negative.   Endo/Heme/Allergies: Negative.   Psychiatric/Behavioral: Negative.   All other systems reviewed and are negative.      Objective:    BP 134/80 (BP Location: Left Arm, Patient Position: Sitting, Cuff Size: Normal)   Pulse 93   Temp 98.6 F (37 C) (Oral)   Ht 5\' 4"  (1.626 m)   Wt 179 lb 3.2 oz (81.3 kg)   SpO2 97%  BMI 30.76 kg/m    Physical Exam  Constitutional: She is oriented to person, place, and time. She appears well-developed and well-nourished. No distress.  HENT:  Head: Normocephalic and atraumatic.  Right Ear: External ear normal.  Left Ear: External ear normal.  Eyes: EOM are normal.  Neck: Normal range of motion.  Cardiovascular: Normal rate and regular rhythm.  Pulmonary/Chest: Effort normal. She has wheezes in the right lower field.  Musculoskeletal: Normal range of motion. She exhibits no edema.  Neurological: She is alert and oriented to person, place, and time. No cranial nerve deficit. Coordination normal.  Skin: Skin is warm and dry. She is not diaphoretic.  Psychiatric: She has a normal mood and affect. Her behavior is normal. Judgment and thought content normal.  Nursing note and vitals reviewed.         Assessment & Plan:   Hospital discharge follow-up  Type 2 diabetes mellitus without complication,  unspecified whether long term insulin use (HCC) - Plan: Hemoglobin A1c, Comprehensive metabolic panel, Lipid panel, TSH  Asthmatic bronchitis with acute exacerbation, unspecified asthma severity, unspecified whether persistent - Plan: DG Chest 2 View  Nausea vomiting and diarrhea  Subacute vaginitis No follow-ups on file.

## 2017-10-03 NOTE — Assessment & Plan Note (Signed)
Recurrent, occurs with steroid use and abx use. eRx sent for diflucan if she does develop symptoms while traveling this week. The patient indicates understanding of these issues and agrees with the plan. Call or return to clinic prn if these symptoms worsen or fail to improve as anticipated.

## 2017-10-03 NOTE — Assessment & Plan Note (Signed)
On levaquin and prednisone per her allergist. Repeat CXR today and route results to her allergist. Will check a urine legionairres too as she has been sick [place]  Her bed at the resort was beneath an air conditioner vent.

## 2017-10-03 NOTE — Assessment & Plan Note (Signed)
See below- improving but still remaining. Likely due to rxs. eRx sent for zofran. Repeat lipase today. Call or return to clinic prn if these symptoms worsen or fail to improve as anticipated. The patient indicates understanding of these issues and agrees with the plan.

## 2017-10-03 NOTE — Patient Instructions (Signed)
Great to meet you. I will call you with your results from today and you can view them online.    I have called in both zofran to take as needed for nausea and diflucan.  Have a wonderful trip!

## 2017-10-04 ENCOUNTER — Encounter: Payer: Self-pay | Admitting: Family Medicine

## 2017-10-05 LAB — LEGIONELLA ANTIGEN, URINE
LEGIONELLA ANTIGEN, URINE: NOT DETECTED
MICRO NUMBER: 91029871
SOURCE:: 0
SPECIMEN QUALITY:: ADEQUATE

## 2017-10-06 ENCOUNTER — Encounter: Payer: Self-pay | Admitting: Family Medicine

## 2017-10-09 ENCOUNTER — Telehealth: Payer: Self-pay | Admitting: Family Medicine

## 2017-10-09 NOTE — Telephone Encounter (Signed)
Last prescription for Ondansetron 4mg  po written on 10/03/17 #30. Pt states the pharmacy on filled 18 pills.

## 2017-10-09 NOTE — Telephone Encounter (Signed)
Copied from CRM [id number]. Topic: Quick Communication - Rx Refill/Question >> Oct 09, 2017 10:46 AM Mickel Baas B, [place] wrote: **Requesting dissolvable tablets--States that pharmacy only filled 18 pills**  Medication: ondansetron (ZOFRAN) 4 MG tablet   Has the patient contacted their pharmacy? Yes.   (Agent: If no, request that the patient contact the pharmacy for the refill.) (Agent: If yes, when and what did the pharmacy advise?)  Preferred Pharmacy (with phone number or street name): [id number] - [place], [place] - [address]  Agent: Please be advised that RX refills may take up to 3 business days. We ask that you follow-up with your pharmacy.

## 2017-10-11 ENCOUNTER — Other Ambulatory Visit: Payer: Self-pay

## 2017-10-11 DIAGNOSIS — R197 Diarrhea, unspecified: Secondary | ICD-10-CM

## 2017-10-11 DIAGNOSIS — R112 Nausea with vomiting, unspecified: Secondary | ICD-10-CM

## 2017-10-11 MED ORDER — ONDANSETRON HCL 4 MG PO TABS: 4.0000 mg | ORAL_TABLET | Freq: Three times a day (TID) | ORAL | 0 refills | Status: DC | PRN

## 2017-10-11 NOTE — Telephone Encounter (Signed)
Patient calling and states that she called the pharmacy and they stated they cannot fill the dissolvable tablets due to the way the rx is written. Informed patient of the "note to pharmacy" on the rx and she stated she will advise them of that. Did let her know that I will let the office know in case the rx needs to be written differently for this pt.

## 2017-10-11 NOTE — Telephone Encounter (Signed)
Sent new rx to pharmacy as requested by pt

## 2017-10-11 NOTE — Telephone Encounter (Signed)
Patient is calling because she said she has not heard back from Dr Elmer Sow nurse regarding her zofran. She said that she just took her last dosage of it. The pharmacy only gave her 18 pills when they were suppose to give her 30 and she did not realize that until she got to [place]. She said she is going to be sick in about 6 hours without this medication. She prefers to have the dissolvable ablets instead because they work better. Call back # [phone]

## 2017-10-22 ENCOUNTER — Ambulatory Visit (INDEPENDENT_AMBULATORY_CARE_PROVIDER_SITE_OTHER)
Admission: RE | Admit: 2017-10-22 | Discharge: 2017-10-22 | Disposition: A | Discharge status: Home / Self Care | Payer: BC Managed Care – PPO | Source: Ambulatory Visit | Attending: Nurse Practitioner | Admitting: Nurse Practitioner

## 2017-10-22 ENCOUNTER — Other Ambulatory Visit: Payer: BC Managed Care – PPO

## 2017-10-22 ENCOUNTER — Encounter: Payer: Self-pay | Admitting: Nurse Practitioner

## 2017-10-22 ENCOUNTER — Ambulatory Visit (INDEPENDENT_AMBULATORY_CARE_PROVIDER_SITE_OTHER): Payer: BC Managed Care – PPO | Admitting: Nurse Practitioner

## 2017-10-22 VITALS — BP 134/80 | HR 91 | Ht 64.0 in | Wt 182.0 lb

## 2017-10-22 DIAGNOSIS — R05 Cough: Secondary | ICD-10-CM

## 2017-10-22 DIAGNOSIS — R053 Chronic cough: Secondary | ICD-10-CM

## 2017-10-22 DIAGNOSIS — J45901 Unspecified asthma with (acute) exacerbation: Secondary | ICD-10-CM

## 2017-10-22 DIAGNOSIS — R911 Solitary pulmonary nodule: Secondary | ICD-10-CM | POA: Diagnosis not present

## 2017-10-22 LAB — TIQ-NTM

## 2017-10-22 MED ORDER — PREDNISONE 10 MG PO TABS: ORAL_TABLET | ORAL | 0 refills | Status: DC

## 2017-10-22 MED ORDER — BENZONATATE 100 MG PO CAPS: 100.0000 mg | ORAL_CAPSULE | Freq: Three times a day (TID) | ORAL | 1 refills | Status: DC

## 2017-10-22 NOTE — Assessment & Plan Note (Signed)
Patient Instructions  Will order chest x ray Will order IgE Will order sputum cultures Will call with results Will order prednisone Continue all other medications Follow up with Dr. McQuaid in 1 week  You need to try to suppress your cough to allow your larynx (voice box) to heal.  For three days don't talk, laugh, sing, or clear your throat. Do everything you can to suppress the cough during this time. Use hard candies (sugarless Jolly Ranchers) or non-mint or non-menthol containing cough drops during this time to soothe your throat.  Use a cough suppressant around the clock during this time.  After three days, gradually increase the use of your voice and back off on the cough suppressants.   

## 2017-10-22 NOTE — Progress Notes (Signed)
@Patient  ID: Karen Barnes, female    DOB: 03/10/1960, 56 y.o.   MRN: [id number]  Chief Complaint  Patient presents with  . Acute Visit    coughing since 8/7, bronchospasm, given abx 3 times, prednisone, solu medrol but no relief , feels like she is drowning in mucuc,  fatigue    Referring provider: Dianne Dun, MD  HPI  57 year old female never smoker with asthma followed by Dr. Kendrick Fries. Health history includes environmental allergies, DM, GERD  Tests: Legionella antigen 10/03/17 - negative Chest xray 10/03/17 - negative chest CT Chest 10/18/15 - No evidence of acute cardiopulmonary disease. 3 mm left lower lobe pulmonary nodule, likely benign.    OV 9/y16/19 - Acute - cough Patient presents with ongoing cough that started after a trip to the [place] in July. She states that she came down with sinus congestion and cough productive of yellow sputum. She has taken 3 rounds of antibiotics (clarythromicin, Levaquin, and omnicef).  She states that symptoms have progressively worsened. She has been compliant with Symbicort, Spiriva, allegra, Proventil, Flonase, Singulair, and Protonix. She denies fever, shortness of breath, or chest pain, or edema. She recently had a cardiac work up and sees cardiology regularly. She is followed y GI for GERD and is scheduled for endoscopy this week.  She has been followed by Dr. Madie Reno allergy specialist. He advised her to see pulmonology because he thought the cough was not related to asthma and she might need a bronch. States that she had spirometrty at her last visit with him that was normal.   Note: She was seen here in 2017 for the same symptoms and did not follow up. It was reccommended at that time that she start Xolair, but she did not follow up on that. She also had a lung nodule on CT and it was recommended that she follow up in 1 year for repeat CT which she did not follow up on.     Allergies  Allergen Reactions  . Butorphanol Tartrate  Other (See Comments)    REACTION: hallucinations  . Hydrocodone Other (See Comments)    HALLUCINATIONS  . Roxicet [Oxycodone-Acetaminophen] Other (See Comments)    HALLUCINATIONS  . Penicillins Hives    Occurred at age 32/CHILDHOOD ALLERGY Has patient had a PCN reaction causing immediate rash, facial/tongue/throat swelling, SOB or lightheadedness with hypotension: Unknown Has patient had a PCN reaction causing severe rash involving mucus membranes or skin necrosis: Unknown Has patient had a PCN reaction that required hospitalization: Unknown Has patient had a PCN reaction occurring within the last 10 years: No If all of the above answers are "NO", then may proceed with Cephalosporin use.     Immunization History  Administered Date(s) Administered  . Influenza Split 03/12/2012, 11/12/2013, 11/07/2014  . Influenza Whole 12/08/2009  . Influenza,inj,Quad PF,6+ Mos 12/06/2012  . Influenza-Unspecified 11/06/2016  . Pneumococcal Conjugate-13 09/24/2015  . Pneumococcal Polysaccharide-23 09/18/2014  . Td 03/07/2007  . Tdap 03/30/2017    Past Medical History:  Diagnosis Date  . Asthma, moderate persistent    sees Dr Irena Cords  . Diabetes mellitus    A1C 6.2---->  2009  . Family history of adverse reaction to anesthesia    sister had cardiac arrest with rhinoplasty  . Fibrocystic breast   . GERD (gastroesophageal reflux disease)   . HH (hiatus hernia) 09/2014   Dr. Ewing Schlein  . History of echocardiogram    Echo 5/19: EF 55-60, normal wall motion, PASP 41  .  History of hiatal hernia   . History of nuclear stress test    Nuclear stress test 5/19:  EF 64, normal perfusion, Low risk  . Hot flashes   . Hyperlipidemia   . Normal nuclear stress test 2006  . Palpitations    PACs sees cards   . Recurrent cystitis    took  postcoital antibiotic for years, symptoms better after her hysterectomy 1996, symptoms re-surface in 2011  . RUQ abdominal pain 2016   EGD showed small HH, otherwise  normal  . Wears glasses     Tobacco History: Social History   Tobacco Use  Smoking Status Never Smoker  Smokeless Tobacco Never Used   Counseling given: Yes   Outpatient Encounter Medications as of 10/22/2017  Medication Sig  . albuterol (PROVENTIL HFA;VENTOLIN HFA) 108 (90 BASE) MCG/ACT inhaler Inhale 2 puffs into the lungs every 6 (six) hours as needed.  . bifidobacterium infantis (ALIGN) capsule Take 1 capsule by mouth daily.  . budesonide-formoterol (SYMBICORT) 160-4.5 MCG/ACT inhaler Inhale 2 puffs into the lungs 2 (two) times daily.  . Calcium Carbonate-Vitamin D (CALCIUM 600+D) 600-200 MG-UNIT TABS Take 1 tablet by mouth daily.  . Continuous Blood Gluc Sensor (FREESTYLE LIBRE 14 DAY SENSOR) MISC USE AS DIRECTED PER MD  . Continuous Blood Gluc Sensor (FREESTYLE LIBRE SENSOR SYSTEM) MISC CHECK BLOOD SUGAR DAILY AS DIRECTED. CHANGE DEVICE EVERY 10 DAYS  . diltiazem (CARDIZEM) 30 MG tablet Take 1 tablet (30 mg total) by mouth every 6 (six) hours as needed (for palpitations).  . EPINEPHrine (AUVI-Q) 0.3 mg/0.3 mL IJ SOAJ injection Inject 0.3 mg into the muscle as directed.  . fexofenadine (ALLEGRA) 180 MG tablet Take 180 mg by mouth every morning.   . fluticasone (FLONASE) 50 MCG/ACT nasal spray Place 2 sprays into the nose daily. AND IF NEEDED 2 SPRAYS AT NIGHT  . furosemide (LASIX) 20 MG tablet Take 1 tablet (20 mg total) by mouth daily as needed for fluid or edema.  . hydrocortisone 2.5 % cream APPLY EXTERNALLY TO THE AFFECTED AREA TWICE DAILY AS NEEDED  . metFORMIN (GLUCOPHAGE) 1000 MG tablet TAKE 1&1/2 TABLETS BY MOUTH EVERY MORNING AND 1 TABLET EVERY EVENING (Patient taking differently: TAKE 1&1/2 (1500mg ) TABLETS BY MOUTH EVERY MORNING AND 1 TABLET (1000mg ) EVERY EVENING)  . montelukast (SINGULAIR) 10 MG tablet Take 10 mg by mouth at bedtime.    . Multiple Vitamins-Minerals (MULTIVITAMIN WITH MINERALS) tablet Take 1 tablet by mouth daily.  . nitrofurantoin,  macrocrystal-monohydrate, (MACROBID) 100 MG capsule TAKE AS DIRECTED POST COITAL  . ondansetron (ZOFRAN) 4 MG tablet Take 1 tablet (4 mg total) by mouth every 8 (eight) hours as needed for nausea or vomiting.  . pantoprazole (PROTONIX) 40 MG tablet Take 1 tablet (40 mg total) by mouth daily.  . predniSONE (STERAPRED UNI-PAK 48 TAB) 10 MG (48) TBPK tablet See package insert for directions. (Patient taking differently: See package insert for directions. Ends on 9.7.919)  . PRESCRIPTION MEDICATION Allergy Shots  . promethazine (PHENERGAN) 25 MG suppository Place 1 suppository (25 mg total) rectally every 6 (six) hours as needed for nausea or vomiting.  . rosuvastatin (CRESTOR) 5 MG tablet TAKE 1 TABLET(5 MG) BY MOUTH DAILY  . sitaGLIPtin (JANUVIA) 100 MG tablet Take 1 tablet (100 mg total) by mouth daily.  Marland Kitchen SPIRIVA RESPIMAT 1.25 MCG/ACT AERS Inhale 2 puffs into the lungs daily.   . benzonatate (TESSALON) 100 MG capsule Take 1 capsule (100 mg total) by mouth 3 (three) times daily.  Marland Kitchen  predniSONE (DELTASONE) 10 MG tablet Take 3 tabs for 2 days, then 2 tabs for 2 days, then 1 tab for 2 days, then stop   No facility-administered encounter medications on file as of 10/22/2017.      Review of Systems  Review of Systems  Constitutional: Negative.  Negative for chills and fever.  HENT: Negative.   Respiratory: Positive for cough. Negative for shortness of breath and wheezing.   Cardiovascular: Negative.  Negative for chest pain, palpitations and leg swelling.  Gastrointestinal: Negative.   Allergic/Immunologic: Negative.   Neurological: Negative.   Psychiatric/Behavioral: Negative.        Physical Exam  BP 134/80 (BP Location: Left Arm, Cuff Size: Normal)   Pulse 91   Ht 5\' 4"  (1.626 m)   Wt 182 lb (82.6 kg)   SpO2 97%   BMI 31.24 kg/m   Wt Readings from Last 5 Encounters:  10/22/17 182 lb (82.6 kg)  10/03/17 179 lb 3.2 oz (81.3 kg)  09/17/17 187 lb 6 oz (85 kg)  08/24/17 190 lb 3.2  oz (86.3 kg)  08/04/17 183 lb (83 kg)     Physical Exam  Constitutional: She is oriented to person, place, and time. She appears well-developed and well-nourished. No distress.  Cardiovascular: Normal rate and regular rhythm.  Pulmonary/Chest: Effort normal and breath sounds normal.  Frequent harsh cough noted.   Neurological: She is alert and oriented to person, place, and time.  Psychiatric: She has a normal mood and affect.  Nursing note and vitals reviewed.    Imaging: Dg Chest 2 View  Result Date: 10/22/2017 CLINICAL DATA:  Chronic cough. EXAM: CHEST - 2 VIEW COMPARISON:  10/03/2017 FINDINGS: Heart is normal size. Mediastinal contours are within normal limits. Lungs are clear. No effusions. No acute bony abnormality. IMPRESSION: No active cardiopulmonary disease. Electronically Signed   By: Charlett NoseKevin  Dover M.D.   On: 10/22/2017 11:00   Dg Chest 2 View  Result Date: 10/03/2017 CLINICAL DATA:  Follow-up asthmatic bronchitis. EXAM: CHEST - 2 VIEW COMPARISON:  Ten days ago FINDINGS: Normal heart size and mediastinal contours. No acute infiltrate or edema. No effusion or pneumothorax. No acute osseous findings. IMPRESSION: Negative chest. Electronically Signed   By: Marnee SpringJonathon  Watts M.D.   On: 10/03/2017 16:57   Dg Chest 2 View  Result Date: 09/23/2017 CLINICAL DATA:  Nausea, vomiting and diarrhea. EXAM: CHEST - 2 VIEW COMPARISON:  09/17/2017 FINDINGS: Lungs are hypoinflated and otherwise clear. Cardiomediastinal silhouette and remainder of the exam is unchanged. IMPRESSION: Hypoinflation without acute cardiopulmonary disease. Electronically Signed   By: Elberta Fortisaniel  Boyle M.D.   On: 09/23/2017 11:03     Assessment & Plan:   Asthmatic bronchitis Patient Instructions  Will order chest x ray Will order IgE Will order sputum cultures Will call with results Will order prednisone Continue all other medications Follow up with Dr. Kendrick FriesMcQuaid in 1 week  You need to try to suppress your cough  to allow your larynx (voice box) to heal.  For three days don't talk, laugh, sing, or clear your throat. Do everything you can to suppress the cough during this time. Use hard candies (sugarless Jolly Ranchers) or non-mint or non-menthol containing cough drops during this time to soothe your throat.  Use a cough suppressant around the clock during this time.  After three days, gradually increase the use of your voice and back off on the cough suppressants.    Chronic cough Patient Instructions  Will order chest x ray Will  order IgE Will order sputum cultures Will call with results Will order prednisone Continue all other medications Follow up with Dr. Kendrick Fries in 1 week  You need to try to suppress your cough to allow your larynx (voice box) to heal.  For three days don't talk, laugh, sing, or clear your throat. Do everything you can to suppress the cough during this time. Use hard candies (sugarless Jolly Ranchers) or non-mint or non-menthol containing cough drops during this time to soothe your throat.  Use a cough suppressant around the clock during this time.  After three days, gradually increase the use of your voice and back off on the cough suppressants.    Lung nodule Will need repeat CT to follow up on nodule in the near future     Ivonne Andrew, NP 10/22/2017

## 2017-10-22 NOTE — Assessment & Plan Note (Signed)
Will need repeat CT to follow up on nodule in the near future

## 2017-10-22 NOTE — Assessment & Plan Note (Signed)
Patient Instructions  Will order chest x ray Will order IgE Will order sputum cultures Will call with results Will order prednisone Continue all other medications Follow up with Dr. Kendrick FriesMcQuaid in 1 week  You need to try to suppress your cough to allow your larynx (voice box) to heal.  For three days don't talk, laugh, sing, or clear your throat. Do everything you can to suppress the cough during this time. Use hard candies (sugarless Jolly Ranchers) or non-mint or non-menthol containing cough drops during this time to soothe your throat.  Use a cough suppressant around the clock during this time.  After three days, gradually increase the use of your voice and back off on the cough suppressants.

## 2017-10-22 NOTE — Patient Instructions (Addendum)
Will order chest x ray Will order IgE Will order sputum cultures Will call with results Will order prednisone Continue all other medications Follow up with Dr. Kendrick FriesMcQuaid in 1 week Please call the office or go to the ED if symptoms worsen  You need to try to suppress your cough to allow your larynx (voice box) to heal.  For three days don't talk, laugh, sing, or clear your throat. Do everything you can to suppress the cough during this time. Use hard candies (sugarless Jolly Ranchers) or non-mint or non-menthol containing cough drops during this time to soothe your throat.  Use a cough suppressant around the clock during this time.  After three days, gradually increase the use of your voice and back off on the cough suppressants.

## 2017-10-24 ENCOUNTER — Ambulatory Visit: Payer: BC Managed Care – PPO | Admitting: Family Medicine

## 2017-10-24 LAB — RESPIRATORY CULTURE OR RESPIRATORY AND SPUTUM CULTURE

## 2017-10-25 NOTE — Progress Notes (Signed)
Reviewed, agree 

## 2017-11-02 ENCOUNTER — Encounter: Payer: Self-pay | Admitting: Pulmonary Disease

## 2017-11-02 ENCOUNTER — Ambulatory Visit (INDEPENDENT_AMBULATORY_CARE_PROVIDER_SITE_OTHER): Payer: BC Managed Care – PPO | Admitting: Pulmonary Disease

## 2017-11-02 ENCOUNTER — Other Ambulatory Visit: Payer: BC Managed Care – PPO

## 2017-11-02 VITALS — BP 118/78 | HR 91 | Ht 64.0 in | Wt 182.8 lb

## 2017-11-02 DIAGNOSIS — J45901 Unspecified asthma with (acute) exacerbation: Secondary | ICD-10-CM | POA: Diagnosis not present

## 2017-11-02 DIAGNOSIS — J849 Interstitial pulmonary disease, unspecified: Secondary | ICD-10-CM

## 2017-11-02 DIAGNOSIS — R05 Cough: Secondary | ICD-10-CM | POA: Diagnosis not present

## 2017-11-02 DIAGNOSIS — R059 Cough, unspecified: Secondary | ICD-10-CM

## 2017-11-02 DIAGNOSIS — R911 Solitary pulmonary nodule: Secondary | ICD-10-CM

## 2017-11-02 LAB — NITRIC OXIDE: Nitric Oxide: 22

## 2017-11-02 NOTE — Patient Instructions (Signed)
Cough: My strongest recommendation is that you do your best to try to suppress the cough You need to try to suppress your cough to allow your larynx (voice box) to heal.  For three days don't talk, laugh, sing, or clear your throat. Do everything you can to suppress the cough during this time. Use hard candies (sugarless Jolly Ranchers) or non-mint or non-menthol containing cough drops during this time to soothe your throat.  Use a cough suppressant (Delsym or what I have prescribed you) around the clock during this time.  After three days, gradually increase the use of your voice and back off on the cough suppressants.  Ongoing sinus drainage: We will check a CT scan of your sinuses and check a blood test to make sure there is not something like a vasculitis which is causing this.  Persistent chest congestion and mucus production in the setting of asthma: We will check a high-resolution CT scan of your chest to make sure there is no evidence of an underlying structural lung disease  Pulmonary nodule: We will check a CT scan of your chest to make sure that this is not changed compared to 2017  Gastroesophageal reflux disease: Continue taking antacid therapy as you are doing  Asthma: Continue Symbicort I would hold off on getting a flu shot until this infection has improved Talk to Dr. Elta Guadeloupe about when to resume immunotherapy  Pulmonary hypertension: The causes of this are many, we will start by working up with a lung function test and CT scan we arranged today  We will see you back in 2 to 3 weeks to go over these results, 30-minute visit only

## 2017-11-02 NOTE — Addendum Note (Signed)
Addended by: Maurene Capes on: 11/02/2017 03:06 PM   Modules accepted: Orders

## 2017-11-02 NOTE — Progress Notes (Signed)
Synopsis: first seen in 2017 for asthma and a pulmonary nodule, life long non-smoker; She has severe GERD and is followed by Dr. [name].  She started on immunotherapy in 2017  Subjective:    Patient ID: Karen Barnes, female    DOB: 27-Nov-1960, 57 y.o.   MRN: [id number]  HPI Chief Complaint  Patient presents with  . Follow-up    Pt. was seen by [place] on 9/16 for a cough. Productive cough with yellow phlegm. Has been coughing so hard that she has had to stop in traffic. Did a sputum test on 9/16 but has not heard about results. Per MyChart, test was cancelled.      Trenda Moots says that she went to [place] Aug 2-9 and came back with a cough.  She says that the other couple they went with also had a cold.  She went to her PCP and was treated with an antibiotic x2, solumedrol and prednisone.  She says that she later because nauseated and had a lot of vomiting.  She went to the ER.  She was told to stop her antibiotic.  She tells me that she has been put on a fourth round of prednisone and Levaquin.  She continues to "drown in mucus and cough up crap".  She says that she continues to have sinus drainage.  She has a lot of drainage from her sinuses.  She says that she has a lot of mucus and feels like she is drowning.  She feels like that she is not coughing up as much mucus.  She is taking Tessalon and tussionex.    In 2017 she started immunotherapy, but she has not been on shots since then.    She recalls a bad spell of acid reflux on August 9 and has had several episodes like that since May 2019.  She is currently taking protonix in the morning and omeprazole at night.  She sees Dr. [name].     Past Medical History:  Diagnosis Date  . Asthma, moderate persistent    sees Dr Harold Hedge  . Diabetes mellitus    A1C 6.2---->  2009  . Family history of adverse reaction to anesthesia    sister had cardiac arrest with rhinoplasty  . Fibrocystic breast   . GERD (gastroesophageal reflux  disease)   . HH (hiatus hernia) 09/2014   Dr. [name]  . History of echocardiogram    Echo 5/19: EF 55-60, normal wall motion, PASP 41  . History of hiatal hernia   . History of nuclear stress test    Nuclear stress test 5/19:  EF 64, normal perfusion, Low risk  . Hot flashes   . Hyperlipidemia   . Normal nuclear stress test 2006  . Palpitations    PACs sees cards   . Recurrent cystitis    took  postcoital antibiotic for years, symptoms better after her hysterectomy 1996, symptoms re-surface in 2011  . RUQ abdominal pain 2016   EGD showed small HH, otherwise normal  . Wears glasses      Family History  Problem Relation Age of Onset  . Mitral valve prolapse Mother   . Breast cancer Mother 74       M had ca insitu (age 9), aunt   . Dementia Mother   . Heart disease Father 28       early   . Diabetes Father   . Kidney failure Father   . Sleep apnea Father   . Brain  cancer Other        2 fam members   . Lung cancer Other        uncle, heavy smoker  . Breast cancer Maternal Aunt 60  . CAD Paternal Grandmother 10  . CAD Paternal Grandfather 77  . Colon cancer Neg Hx        Review of Systems  Constitutional: Positive for unexpected weight change. Negative for fever.  HENT: Positive for congestion, postnasal drip and sinus pressure. Negative for dental problem, ear pain, nosebleeds, rhinorrhea, sneezing, sore throat and trouble swallowing.   Eyes: Negative for redness and itching.  Respiratory: Positive for cough. Negative for chest tightness, shortness of breath and wheezing.   Cardiovascular: Negative for palpitations and leg swelling.  Gastrointestinal: Positive for nausea. Negative for vomiting.  Genitourinary: Negative for dysuria.  Musculoskeletal: Negative for joint swelling.  Skin: Negative for rash.  Neurological: Negative for headaches.  Hematological: Does not bruise/bleed easily.  Psychiatric/Behavioral: Negative for dysphoric mood. The patient is not  nervous/anxious.        Objective:   Physical Exam Vitals:   11/02/17 1332  BP: 118/78  Pulse: 91  SpO2: 98%  Weight: 182 lb 12.8 oz (82.9 kg)  Height: '5\' 4"'$  (1.626 m)   RA  Gen: well appearing, rare cough HENT: OP clear, TM's clear, neck supple PULM: CTA B, normal percussion CV: RRR, no mgr, trace edema GI: BS+, soft, nontender Derm: no cyanosis or rash Psyche: normal mood and affect  FENO 10/2017 on symbicort while sick: 22 ppm  10/18/2015 chest CT images personal reviewed showing normal pulmonary parenchyma with the exception of a 3.4 mm nodule in the left lower lobe.  August 2017 primary care office notes reviewed where she was seen for an asthma exacerbation. She took prednisone.  Multiple chest x-rays from this year reviewed showing no pulmonary parenchymal infiltrate, clear lungs  Visit from her note with [place] in 2019 reviewed where she was seen for cough, Tessalon was prescribed     Assessment & Plan:   Asthmatic bronchitis with acute exacerbation, unspecified asthma severity, unspecified whether persistent  Solitary pulmonary nodule - Plan: CT Chest High Resolution  Cough - Plan: CT Maxillofacial LTD WO CM, Nitric oxide, ANCA screen with reflex titer  Lung nodule  Discussion: Shaylene has persistent cough of uncertain etiology.  She has baseline severe persistent asthma, allergic rhinitis, gastroesophageal reflux disease.  It is not clear to me what is causing her ongoing sinus congestion and mucus production.  She has a number of questions today for which I explained to her I do not have time to answer because I have not seen her in over 2 years.  I think that there is reasonable suspicion for some sort of infectious sinusitis given the mucus production she claims she has despite an adequate allergic rhinitis regimen from the allergy folks.  The differential diagnosis would include something like a mold infection in her sinuses, less likely a vasculitis.  I also  consider whether or not she could have some sort of atypical infection in her lungs considering the duration of symptoms.  She also has questions about the finding of pulmonary hypertension this year, explained to her that the causes of that are myriad and would require more of a work-up.  I told her today that I think the reason why she is coughing is because she focuses entirely too much on her cough and remains frustrated with it with no real effort at trying to  stop it.  Plan: Cough: My strongest recommendation is that you do your best to try to suppress the cough You need to try to suppress your cough to allow your larynx (voice box) to heal.  For three days don't talk, laugh, sing, or clear your throat. Do everything you can to suppress the cough during this time. Use hard candies (sugarless Jolly Ranchers) or non-mint or non-menthol containing cough drops during this time to soothe your throat.  Use a cough suppressant (Delsym or what I have prescribed you) around the clock during this time.  After three days, gradually increase the use of your voice and back off on the cough suppressants.  Ongoing sinus drainage: We will check a CT scan of your sinuses and check a blood test to make sure there is not something like a vasculitis which is causing this.  Persistent chest congestion and mucus production in the setting of asthma: We will check a high-resolution CT scan of your chest to make sure there is no evidence of an underlying structural lung disease  Pulmonary nodule: We will check a CT scan of your chest to make sure that this is not changed compared to 2017  Gastroesophageal reflux disease: Continue taking antacid therapy as you are doing  Asthma: Continue Symbicort I would hold off on getting a flu shot until this infection has improved Talk to Dr. Burnett Corrente about when to resume immunotherapy  Pulmonary hypertension: The causes of this are many, we will start by working up  with a lung function test and CT scan we arranged today  We will see you back in 2 to 3 weeks to go over these results, 30-minute visit only  > 50% of this 40 minute visit spent face to face         Current Outpatient Medications:  .  albuterol (PROVENTIL HFA;VENTOLIN HFA) 108 (90 BASE) MCG/ACT inhaler, Inhale 2 puffs into the lungs every 6 (six) hours as needed., Disp: , Rfl:  .  benzonatate (TESSALON) 100 MG capsule, Take 1 capsule (100 mg total) by mouth 3 (three) times daily., Disp: 30 capsule, Rfl: 1 .  bifidobacterium infantis (ALIGN) capsule, Take 1 capsule by mouth daily., Disp: , Rfl:  .  budesonide-formoterol (SYMBICORT) 160-4.5 MCG/ACT inhaler, Inhale 2 puffs into the lungs 2 (two) times daily., Disp: , Rfl:  .  Calcium Carbonate-Vitamin D (CALCIUM 600+D) 600-200 MG-UNIT TABS, Take 1 tablet by mouth daily., Disp: , Rfl:  .  Continuous Blood Gluc Sensor (FREESTYLE LIBRE 14 DAY SENSOR) MISC, USE AS DIRECTED PER MD, Disp: 2 each, Rfl: 3 .  Continuous Blood Gluc Sensor (FREESTYLE LIBRE SENSOR SYSTEM) MISC, CHECK BLOOD SUGAR DAILY AS DIRECTED. CHANGE DEVICE EVERY 10 DAYS, Disp: 3 each, Rfl: 12 .  diltiazem (CARDIZEM) 30 MG tablet, Take 1 tablet (30 mg total) by mouth every 6 (six) hours as needed (for palpitations)., Disp: 30 tablet, Rfl: 4 .  EPINEPHrine (AUVI-Q) 0.3 mg/0.3 mL IJ SOAJ injection, Inject 0.3 mg into the muscle as directed., Disp: , Rfl:  .  fexofenadine (ALLEGRA) 180 MG tablet, Take 180 mg by mouth every morning. , Disp: , Rfl:  .  fluticasone (FLONASE) 50 MCG/ACT nasal spray, Place 2 sprays into the nose daily. AND IF NEEDED 2 SPRAYS AT NIGHT, Disp: , Rfl:  .  furosemide (LASIX) 20 MG tablet, Take 1 tablet (20 mg total) by mouth daily as needed for fluid or edema., Disp: 30 tablet, Rfl: 3 .  hydrocortisone 2.5 % cream,  APPLY EXTERNALLY TO THE AFFECTED AREA TWICE DAILY AS NEEDED, Disp: 30 g, Rfl: 0 .  metFORMIN (GLUCOPHAGE) 1000 MG tablet, TAKE 1&1/2 TABLETS BY MOUTH  EVERY MORNING AND 1 TABLET EVERY EVENING (Patient taking differently: TAKE 1&1/2 ('1500mg'$ ) TABLETS BY MOUTH EVERY MORNING AND 1 TABLET ('1000mg'$ ) EVERY EVENING), Disp: 225 tablet, Rfl: 1 .  montelukast (SINGULAIR) 10 MG tablet, Take 10 mg by mouth at bedtime.  , Disp: , Rfl:  .  Multiple Vitamins-Minerals (MULTIVITAMIN WITH MINERALS) tablet, Take 1 tablet by mouth daily., Disp: , Rfl:  .  nitrofurantoin, macrocrystal-monohydrate, (MACROBID) 100 MG capsule, TAKE AS DIRECTED POST COITAL, Disp: 30 capsule, Rfl: 0 .  ondansetron (ZOFRAN) 4 MG tablet, Take 1 tablet (4 mg total) by mouth every 8 (eight) hours as needed for nausea or vomiting., Disp: 30 tablet, Rfl: 0 .  pantoprazole (PROTONIX) 40 MG tablet, Take 1 tablet (40 mg total) by mouth daily., Disp: 30 tablet, Rfl: 3 .  predniSONE (DELTASONE) 10 MG tablet, Take 3 tabs for 2 days, then 2 tabs for 2 days, then 1 tab for 2 days, then stop, Disp: 12 tablet, Rfl: 0 .  predniSONE (STERAPRED UNI-PAK 48 TAB) 10 MG (48) TBPK tablet, See package insert for directions. (Patient taking differently: See package insert for directions. Ends on 9.7.919), Disp: 48 tablet, Rfl: 0 .  PRESCRIPTION MEDICATION, Allergy Shots, Disp: , Rfl:  .  promethazine (PHENERGAN) 25 MG suppository, Place 1 suppository (25 mg total) rectally every 6 (six) hours as needed for nausea or vomiting., Disp: 12 each, Rfl: 0 .  rosuvastatin (CRESTOR) 5 MG tablet, TAKE 1 TABLET(5 MG) BY MOUTH DAILY, Disp: 90 tablet, Rfl: 3 .  sitaGLIPtin (JANUVIA) 100 MG tablet, Take 1 tablet (100 mg total) by mouth daily., Disp: 90 tablet, Rfl: 1 .  SPIRIVA RESPIMAT 1.25 MCG/ACT AERS, Inhale 2 puffs into the lungs daily. , Disp: , Rfl: 5

## 2017-11-07 ENCOUNTER — Other Ambulatory Visit: Payer: BC Managed Care – PPO

## 2017-11-07 ENCOUNTER — Ambulatory Visit (INDEPENDENT_AMBULATORY_CARE_PROVIDER_SITE_OTHER)
Admission: RE | Admit: 2017-11-07 | Discharge: 2017-11-07 | Disposition: A | Discharge status: Home / Self Care | Payer: BC Managed Care – PPO | Source: Ambulatory Visit | Attending: Pulmonary Disease | Admitting: Pulmonary Disease

## 2017-11-07 DIAGNOSIS — J849 Interstitial pulmonary disease, unspecified: Secondary | ICD-10-CM

## 2017-11-07 DIAGNOSIS — R05 Cough: Secondary | ICD-10-CM

## 2017-11-07 DIAGNOSIS — R911 Solitary pulmonary nodule: Secondary | ICD-10-CM

## 2017-11-07 DIAGNOSIS — R059 Cough, unspecified: Secondary | ICD-10-CM

## 2017-11-08 ENCOUNTER — Encounter: Payer: Self-pay | Admitting: Family Medicine

## 2017-11-08 ENCOUNTER — Ambulatory Visit (INDEPENDENT_AMBULATORY_CARE_PROVIDER_SITE_OTHER): Payer: BC Managed Care – PPO | Admitting: Family Medicine

## 2017-11-08 VITALS — BP 136/84 | HR 95 | Temp 98.8°F | Ht 64.0 in | Wt 179.0 lb

## 2017-11-08 DIAGNOSIS — R112 Nausea with vomiting, unspecified: Secondary | ICD-10-CM | POA: Diagnosis not present

## 2017-11-08 DIAGNOSIS — R7989 Other specified abnormal findings of blood chemistry: Secondary | ICD-10-CM | POA: Diagnosis not present

## 2017-11-08 DIAGNOSIS — R05 Cough: Secondary | ICD-10-CM | POA: Diagnosis not present

## 2017-11-08 DIAGNOSIS — E041 Nontoxic single thyroid nodule: Secondary | ICD-10-CM

## 2017-11-08 DIAGNOSIS — E119 Type 2 diabetes mellitus without complications: Secondary | ICD-10-CM

## 2017-11-08 DIAGNOSIS — R053 Chronic cough: Secondary | ICD-10-CM

## 2017-11-08 DIAGNOSIS — R197 Diarrhea, unspecified: Secondary | ICD-10-CM

## 2017-11-08 MED ORDER — ONDANSETRON HCL 4 MG PO TABS: 4.0000 mg | ORAL_TABLET | Freq: Three times a day (TID) | ORAL | 0 refills | Status: DC | PRN

## 2017-11-08 NOTE — Progress Notes (Signed)
Subjective:   Patient ID: Karen Barnes, female    DOB: August 30, 1960, 57 y.o.   MRN: [id number]  JUAN OLTHOFF is a pleasant 57 y.o. year old female who presents to clinic today with Follow-up (Patient is here today to F/U with Thyroid and A1C labs as they were off most likley due to the use of Prednisone.  She advises that it was good that the appt had to be changed as she was put on another round of it.  She has been off of the Prednisone for about a week now.  She has an appt with Pulmonologist Dr. Kendrick Fries 10.9.19 to go over results.  She still has residual cough.  Yesterday had CT of Sinuses and Lungs.  Showed sinus full on one side and not as bad on the other.  Chest showed several benign) and Addendum (pulmonary nodules unchanged since 2017.  Bronchiostasis in LLL and RLL which is Chronic per her research.  There is a nodule on right side of thyroid.  Is wondering if that is the causative of her thyroid numbers being consistently irregular. )  on 11/08/2017  HPI: Here for follow up.   Established care with me on 10/04/17- note reviewed- ED follow up. a1c was higher and TSH was low at her initial visit with me.  She has been on steroids.  She is here to follow this up today.  FSBS are improving since she is off prednisone- was 141 one hour post prandial yesterday.  Lab Results  Component Value Date   HGBA1C 7.7 (H) 10/03/2017   Lab Results  Component Value Date   TSH 0.31 (L) 10/03/2017    Saw Dr. Kendrick Fries on 11/02/17- note reviewed. He ordered repeat chest CT and CT of her sinuses, also ordered additional lab work/vasculitis work up.  CT of chest showed bronchiectasis and thyroid nodule.  She has follow up appointment with Dr. Kendrick Fries on 11/14/17 to discuss CT results.  Dg Chest 2 View  Result Date: 10/22/2017 CLINICAL DATA:  Chronic cough. EXAM: CHEST - 2 VIEW COMPARISON:  10/03/2017 FINDINGS: Heart is normal size. Mediastinal contours are within normal limits. Lungs are  clear. No effusions. No acute bony abnormality. IMPRESSION: No active cardiopulmonary disease. Electronically Signed   By: Charlett Nose M.D.   On: 10/22/2017 11:00   Ct Chest High Resolution  Result Date: 11/08/2017 CLINICAL DATA:  Productive cough for 2 months. Asthma. Follow-up pulmonary nodule. EXAM: CT CHEST WITHOUT CONTRAST TECHNIQUE: Multidetector CT imaging of the chest was performed following the standard protocol without intravenous contrast. High resolution imaging of the lungs, as well as inspiratory and expiratory imaging, was performed. COMPARISON:  10/18/2015 chest CT.  10/22/2017 chest radiograph. FINDINGS: Cardiovascular: Normal heart size. No significant pericardial effusion/thickening. Great vessels are normal in course and caliber. Mediastinum/Nodes: Subcentimeter hypodense posterior right thyroid lobe nodule. Unremarkable esophagus. No pathologically enlarged axillary, mediastinal or hilar lymph nodes, noting limited sensitivity for the detection of hilar adenopathy on this noncontrast study. Lungs/Pleura: No pneumothorax. No pleural effusion. No acute consolidative airspace disease or lung masses. A few scattered solid pulmonary nodules in both lungs measuring up to the 4 mm in the medial right lower lobe (series 3/image 84) are all stable since 10/18/2015 chest CT, considered benign. No significant regions of subpleural reticulation, ground-glass attenuation, architectural distortion or frank honeycombing. There is scattered mild cylindrical bronchiectasis in the mid to lower lungs bilaterally, for example in the right middle lobe on series 3/images 80 and 84 and  in the dependent left lower lobe on images 85 and 86. Moderate patchy air trapping in both lungs on the expiration sequence. Stable parenchymal band in the left lower lobe compatible with mild postinfectious/postinflammatory scarring. Upper abdomen: No acute abnormality. Musculoskeletal:  No aggressive appearing focal osseous  lesions. IMPRESSION: 1. Mild cylindrical bronchiectasis scattered in the mid to lower lungs. 2. Moderate patchy air trapping in both lungs, indicative of small airways disease. 3. Stable scattered small solid pulmonary nodules since 2017, considered benign. Electronically Signed   By: Delbert Phenix M.D.   On: 11/08/2017 07:25   Ct Maxillofacial Ltd Wo Cm  Result Date: 11/07/2017 CLINICAL DATA:  Persistent, productive cough. EXAM: CT PARANASAL SINUS LIMITED WITHOUT CONTRAST TECHNIQUE: Non-contiguous multidetector CT images of the paranasal sinuses were obtained in a single plane without contrast. COMPARISON:  Head CT 02/01/2017 FINDINGS: Single opacified right ethmoid air cell. No fluid level is seen. Nasal passages are symmetric and widely patent. No incidental soft tissue finding. IMPRESSION: 1. Single opacified right ethmoid air cell. 2. No fluid levels or nasal obstruction. Electronically Signed   By: Marnee Spring M.D.   On: 11/07/2017 16:12   Current Outpatient Medications on File Prior to Visit  Medication Sig Dispense Refill  . albuterol (PROVENTIL HFA;VENTOLIN HFA) 108 (90 BASE) MCG/ACT inhaler Inhale 2 puffs into the lungs every 6 (six) hours as needed.    . benzonatate (TESSALON) 100 MG capsule Take 1 capsule (100 mg total) by mouth 3 (three) times daily. 30 capsule 1  . bifidobacterium infantis (ALIGN) capsule Take 1 capsule by mouth daily.    . budesonide-formoterol (SYMBICORT) 160-4.5 MCG/ACT inhaler Inhale 2 puffs into the lungs 2 (two) times daily.    . Calcium Carbonate-Vitamin D (CALCIUM 600+D) 600-200 MG-UNIT TABS Take 1 tablet by mouth daily.    . Continuous Blood Gluc Sensor (FREESTYLE LIBRE 14 DAY SENSOR) MISC USE AS DIRECTED PER MD 2 each 3  . Continuous Blood Gluc Sensor (FREESTYLE LIBRE SENSOR SYSTEM) MISC CHECK BLOOD SUGAR DAILY AS DIRECTED. CHANGE DEVICE EVERY 10 DAYS 3 each 12  . diltiazem (CARDIZEM) 30 MG tablet Take 1 tablet (30 mg total) by mouth every 6 (six) hours as  needed (for palpitations). 30 tablet 4  . EPINEPHrine (AUVI-Q) 0.3 mg/0.3 mL IJ SOAJ injection Inject 0.3 mg into the muscle as directed.    . fexofenadine (ALLEGRA) 180 MG tablet Take 180 mg by mouth every morning.     . fluticasone (FLONASE) 50 MCG/ACT nasal spray Place 2 sprays into the nose daily. AND IF NEEDED 2 SPRAYS AT NIGHT    . furosemide (LASIX) 20 MG tablet Take 1 tablet (20 mg total) by mouth daily as needed for fluid or edema. 30 tablet 3  . hydrocortisone 2.5 % cream APPLY EXTERNALLY TO THE AFFECTED AREA TWICE DAILY AS NEEDED 30 g 0  . metFORMIN (GLUCOPHAGE) 1000 MG tablet TAKE 1&1/2 TABLETS BY MOUTH EVERY MORNING AND 1 TABLET EVERY EVENING (Patient taking differently: TAKE 1&1/2 (1500mg ) TABLETS BY MOUTH EVERY MORNING AND 1 TABLET (1000mg ) EVERY EVENING) 225 tablet 1  . montelukast (SINGULAIR) 10 MG tablet Take 10 mg by mouth at bedtime.      . Multiple Vitamins-Minerals (MULTIVITAMIN WITH MINERALS) tablet Take 1 tablet by mouth daily.    . nitrofurantoin, macrocrystal-monohydrate, (MACROBID) 100 MG capsule TAKE AS DIRECTED POST COITAL 30 capsule 0  . pantoprazole (PROTONIX) 40 MG tablet Take 1 tablet (40 mg total) by mouth daily. 30 tablet 3  . predniSONE (DELTASONE)  10 MG tablet Take 3 tabs for 2 days, then 2 tabs for 2 days, then 1 tab for 2 days, then stop 12 tablet 0  . predniSONE (STERAPRED UNI-PAK 48 TAB) 10 MG (48) TBPK tablet See package insert for directions. (Patient taking differently: See package insert for directions. Ends on 9.7.919) 48 tablet 0  . PRESCRIPTION MEDICATION Allergy Shots    . promethazine (PHENERGAN) 25 MG suppository Place 1 suppository (25 mg total) rectally every 6 (six) hours as needed for nausea or vomiting. 12 each 0  . rosuvastatin (CRESTOR) 5 MG tablet TAKE 1 TABLET(5 MG) BY MOUTH DAILY 90 tablet 3  . sitaGLIPtin (JANUVIA) 100 MG tablet Take 1 tablet (100 mg total) by mouth daily. 90 tablet 1  . SPIRIVA RESPIMAT 1.25 MCG/ACT AERS Inhale 2 puffs  into the lungs daily.   5   No current facility-administered medications on file prior to visit.     Allergies  Allergen Reactions  . Butorphanol Tartrate Other (See Comments)    REACTION: hallucinations  . Hydrocodone Other (See Comments)    HALLUCINATIONS  . Roxicet [Oxycodone-Acetaminophen] Other (See Comments)    HALLUCINATIONS  . Penicillins Hives    Occurred at age 47/CHILDHOOD ALLERGY Has patient had a PCN reaction causing immediate rash, facial/tongue/throat swelling, SOB or lightheadedness with hypotension: Unknown Has patient had a PCN reaction causing severe rash involving mucus membranes or skin necrosis: Unknown Has patient had a PCN reaction that required hospitalization: Unknown Has patient had a PCN reaction occurring within the last 10 years: No If all of the above answers are "NO", then may proceed with Cephalosporin use.     Past Medical History:  Diagnosis Date  . Asthma, moderate persistent    sees Dr Irena Cords  . Diabetes mellitus    A1C 6.2---->  2009  . Family history of adverse reaction to anesthesia    sister had cardiac arrest with rhinoplasty  . Fibrocystic breast   . GERD (gastroesophageal reflux disease)   . HH (hiatus hernia) 09/2014   Dr. Ewing Schlein  . History of echocardiogram    Echo 5/19: EF 55-60, normal wall motion, PASP 41  . History of hiatal hernia   . History of nuclear stress test    Nuclear stress test 5/19:  EF 64, normal perfusion, Low risk  . Hot flashes   . Hyperlipidemia   . Normal nuclear stress test 2006  . Palpitations    PACs sees cards   . Recurrent cystitis    took  postcoital antibiotic for years, symptoms better after her hysterectomy 1996, symptoms re-surface in 2011  . RUQ abdominal pain 2016   EGD showed small HH, otherwise normal  . Wears glasses     Past Surgical History:  Procedure Laterality Date  . ABDOMINAL HYSTERECTOMY  1996   no oophorectomy, d/tendometriosis and fibroids approximately 1996  . BREAST  EXCISIONAL BIOPSY Right 03/12/2014   [facility]  . BREAST LUMPECTOMY WITH RADIOACTIVE SEED LOCALIZATION Right 03/12/2014   Procedure: BREAST LUMPECTOMY WITH RADIOACTIVE SEED LOCALIZATION;  Surgeon: Harriette Bouillon, MD;  Location: [facility];  Service: General;  Laterality: Right;  . BREAST SURGERY     Lumpectomy-- BENIGN   . CATARACT EXTRACTION Bilateral 2011  . CESAREAN SECTION    . COSMETIC SURGERY  01/2017   [facility] Touch w/ GYN  . DILATION AND CURETTAGE OF UTERUS    . LIPOMA EXCISION  1978  . UPPER GASTROINTESTINAL ENDOSCOPY    . UPPER GI ENDOSCOPY  09/11/2014   Small Hiatus Hernia, Dr. Ewing Schlein    Family History  Problem Relation Age of Onset  . Mitral valve prolapse Mother   . Breast cancer Mother 41       M had ca insitu (age 29), aunt   . Dementia Mother   . Heart disease Father 7       early   . Diabetes Father   . Kidney failure Father   . Sleep apnea Father   . Brain cancer Other        2 fam members   . Lung cancer Other        uncle, heavy smoker  . Breast cancer Maternal Aunt 60  . CAD Paternal Grandmother 49  . CAD Paternal Grandfather 32  . Colon cancer Neg Hx     Social History   Socioeconomic History  . Marital status: Married    Spouse name: Not on file  . Number of children: 1  . Years of education: College  . Highest education level: Not on file  Occupational History  . Occupation: retired principal  Engineer, production  . Financial resource strain: Not on file  . Food insecurity:    Worry: Not on file    Inability: Not on file  . Transportation needs:    Medical: Not on file    Non-medical: Not on file  Tobacco Use  . Smoking status: Never Smoker  . Smokeless tobacco: Never Used  Substance and Sexual Activity  . Alcohol use: No  . Drug use: No  . Sexual activity: Yes  Lifestyle  . Physical activity:    Days per week: Not on file    Minutes per session: Not on file  . Stress: Not on file  Relationships  . Social connections:     Talks on phone: Not on file    Gets together: Not on file    Attends religious service: Not on file    Active member of club or organization: Not on file    Attends meetings of clubs or organizations: Not on file    Relationship status: Not on file  . Intimate partner violence:    Fear of current or ex partner: Not on file    Emotionally abused: Not on file    Physically abused: Not on file    Forced sexual activity: Not on file  Other Topics Concern  . Not on file  Social History Narrative   Household-- pt and husband    Rare caffeine use   Daughter independent, 1 G-child   Engineer, structural for F and M, both w/ dementia   The PMH, PSH, Social History, Family History, Medications, and allergies have been reviewed in [facility], and have been updated if relevant.   Review of Systems  Constitutional: Negative.   HENT: Negative.   Eyes: Negative.   Respiratory: Positive for cough and shortness of breath.   Cardiovascular: Negative.   Gastrointestinal: Negative.   Endocrine: Negative.   Genitourinary: Negative.   Musculoskeletal: Negative.   Allergic/Immunologic: Negative.   Neurological: Negative.   Hematological: Negative.   Psychiatric/Behavioral: Negative.   All other systems reviewed and are negative.      Objective:    BP 136/84 (BP Location: Left Arm, Patient Position: Sitting, Cuff Size: Normal)   Pulse 95   Temp 98.8 F (37.1 C) (Oral)   Ht 5\' 4"  (1.626 m)   Wt 179 lb (81.2 kg)   SpO2 97%   BMI 30.73 kg/m  Physical Exam  Constitutional: She is oriented to person, place, and time. She appears well-developed and well-nourished. No distress.  HENT:  Head: Normocephalic and atraumatic.  Eyes: EOM are normal.  Neck: Normal range of motion. Neck supple. No thyromegaly present.  Cardiovascular: Normal rate and regular rhythm.  Pulmonary/Chest: Effort normal and breath sounds normal.  Musculoskeletal: Normal range of motion.  Neurological: She is alert and oriented to  person, place, and time.  Skin: Skin is warm and dry. She is not diaphoretic.  Psychiatric: She has a normal mood and affect. Her behavior is normal. Judgment and thought content normal.  Nursing note and vitals reviewed.         Assessment & Plan:   Chronic cough  Thyroid nodule - Plan: US Soft Tissue Head/Neck  Low TSH level  Nausea vomiting and diarrhea - Plan: ondansetron (ZOFRAN) 4 MG tablet No follow-ups on file.

## 2017-11-08 NOTE — Assessment & Plan Note (Signed)
Ct findings show bronchiectasis.  Has follow up scheduled with Dr. Kendrick Fries to discuss.

## 2017-11-08 NOTE — Assessment & Plan Note (Signed)
New- see above. Awaiting thyroid US. Follow up thyroid labs in 2 months.

## 2017-11-08 NOTE — Assessment & Plan Note (Signed)
Incidental finding on chest CT-? If related to low TSH. Will order thyroid US today.  She will follow up with me in 2 months for full thyroid blood panel and a1c. The patient indicates understanding of these issues and agrees with the plan.

## 2017-11-08 NOTE — Patient Instructions (Signed)
Great to see you.  We are ordering a thyroid ultrasound.  Please come see me after 12/04/17 for a follow up of your diabetes, thyroid function.

## 2017-11-08 NOTE — Assessment & Plan Note (Signed)
Improved off steroids. FSBS at are reassuring. a1c in 2 months. No changes made to rxs today. The patient indicates understanding of these issues and agrees with the plan.

## 2017-11-10 ENCOUNTER — Ambulatory Visit
Admission: RE | Admit: 2017-11-10 | Discharge: 2017-11-10 | Disposition: A | Discharge status: Home / Self Care | Payer: BC Managed Care – PPO | Source: Ambulatory Visit | Attending: Obstetrics and Gynecology | Admitting: Obstetrics and Gynecology

## 2017-11-10 DIAGNOSIS — Z803 Family history of malignant neoplasm of breast: Secondary | ICD-10-CM

## 2017-11-12 ENCOUNTER — Other Ambulatory Visit: Payer: Self-pay | Admitting: Pulmonary Disease

## 2017-11-12 DIAGNOSIS — J45901 Unspecified asthma with (acute) exacerbation: Secondary | ICD-10-CM

## 2017-11-13 ENCOUNTER — Ambulatory Visit (INDEPENDENT_AMBULATORY_CARE_PROVIDER_SITE_OTHER): Payer: BC Managed Care – PPO | Admitting: Pulmonary Disease

## 2017-11-13 DIAGNOSIS — J45901 Unspecified asthma with (acute) exacerbation: Secondary | ICD-10-CM | POA: Diagnosis not present

## 2017-11-13 LAB — PULMONARY FUNCTION TEST
DL/VA % pred: 108 %
DL/VA: 5.26 ml/min/mmHg/L
DLCO cor % pred: 86 %
DLCO cor: 21.26 ml/min/mmHg
DLCO unc % pred: 84 %
DLCO unc: 20.72 ml/min/mmHg
FEF 25-75 Post: 3.9 L/sec
FEF 25-75 Pre: 3.88 L/sec
FEF2575-%Change-Post: 0 %
FEF2575-%PRED-PRE: 161 %
FEF2575-%Pred-Post: 162 %
FEV1-%Change-Post: 0 %
FEV1-%PRED-POST: 89 %
FEV1-%PRED-PRE: 89 %
FEV1-POST: 2.33 L
FEV1-Pre: 2.32 L
FEV1FVC-%CHANGE-POST: 0 %
FEV1FVC-%Pred-Pre: 115 %
FEV6-%CHANGE-POST: 0 %
FEV6-%Pred-Post: 78 %
FEV6-%Pred-Pre: 78 %
FEV6-Post: 2.55 L
FEV6-Pre: 2.56 L
FEV6FVC-%Pred-Post: 103 %
FEV6FVC-%Pred-Pre: 103 %
FVC-%CHANGE-POST: 0 %
FVC-%PRED-POST: 76 %
FVC-%PRED-PRE: 76 %
FVC-POST: 2.55 L
FVC-Pre: 2.56 L
POST FEV6/FVC RATIO: 100 %
Post FEV1/FVC ratio: 91 %
Pre FEV1/FVC ratio: 90 %
Pre FEV6/FVC Ratio: 100 %
RV % pred: 76 %
RV: 1.51 L
TLC % PRED: 84 %
TLC: 4.32 L

## 2017-11-13 NOTE — Progress Notes (Signed)
PFT done today. 

## 2017-11-14 ENCOUNTER — Other Ambulatory Visit (INDEPENDENT_AMBULATORY_CARE_PROVIDER_SITE_OTHER): Payer: BC Managed Care – PPO

## 2017-11-14 ENCOUNTER — Encounter: Payer: Self-pay | Admitting: Pulmonary Disease

## 2017-11-14 ENCOUNTER — Ambulatory Visit (INDEPENDENT_AMBULATORY_CARE_PROVIDER_SITE_OTHER): Payer: BC Managed Care – PPO | Admitting: Pulmonary Disease

## 2017-11-14 VITALS — BP 120/70 | HR 86 | Ht 64.0 in | Wt 180.4 lb

## 2017-11-14 DIAGNOSIS — J479 Bronchiectasis, uncomplicated: Secondary | ICD-10-CM | POA: Diagnosis not present

## 2017-11-14 DIAGNOSIS — I272 Pulmonary hypertension, unspecified: Secondary | ICD-10-CM

## 2017-11-14 DIAGNOSIS — Z23 Encounter for immunization: Secondary | ICD-10-CM

## 2017-11-14 LAB — CBC WITH DIFFERENTIAL/PLATELET
Basophils Absolute: 0.1 10*3/uL (ref 0.0–0.1)
Basophils Relative: 1.2 % (ref 0.0–3.0)
EOS PCT: 3.2 % (ref 0.0–5.0)
Eosinophils Absolute: 0.3 10*3/uL (ref 0.0–0.7)
HCT: 38.4 % (ref 36.0–46.0)
Hemoglobin: 12.4 g/dL (ref 12.0–15.0)
LYMPHS ABS: 1.8 10*3/uL (ref 0.7–4.0)
Lymphocytes Relative: 21.8 % (ref 12.0–46.0)
MCHC: 32.4 g/dL (ref 30.0–36.0)
MCV: 79.8 fl (ref 78.0–100.0)
MONOS PCT: 6.3 % (ref 3.0–12.0)
Monocytes Absolute: 0.5 10*3/uL (ref 0.1–1.0)
NEUTROS ABS: 5.5 10*3/uL (ref 1.4–7.7)
NEUTROS PCT: 67.5 % (ref 43.0–77.0)
PLATELETS: 330 10*3/uL (ref 150.0–400.0)
RBC: 4.82 Mil/uL (ref 3.87–5.11)
RDW: 16.3 % — AB (ref 11.5–15.5)
WBC: 8.2 10*3/uL (ref 4.0–10.5)

## 2017-11-14 MED ORDER — FLUTTER DEVI: 1.0000 | Freq: Once | 0 refills | Status: AC

## 2017-11-14 MED ORDER — SODIUM CHLORIDE 3 % IN NEBU: INHALATION_SOLUTION | Freq: Two times a day (BID) | RESPIRATORY_TRACT | 11 refills | Status: DC

## 2017-11-14 NOTE — Patient Instructions (Signed)
Bronchiectasis: This is the medical term that describes enlarged airways.  This causes mucus production and recurrent respiratory infections. We need to help you clear the mucus out of your lungs by having you use a flutter valve: 10 breaths twice a day Use hypertonic saline twice a day Both of these measures are intended to help you clear mucus out If you have a fever, change in mucus color or increased chest congestion call right away so that we can call in a prescription for antibiotics Please provide [place] with another sample of your mucus so that we can test for infection  Chronic sinus infections: We will check the blood work today to look for evidence of an underlying immunodeficiency: CBC with differential, complement 4, complement 3, CH 50, immunoglobulin panel, 6 IgG subclass  Asthma: Continue Advair as you are doing  Pulmonary hypertension: We will refer you back to Dr. Shirlee Latch to consider a right heart catheterization  We will see you back in 4 to 6 weeks or sooner if needed

## 2017-11-14 NOTE — Progress Notes (Signed)
Synopsis: first seen in 2017 for asthma and a pulmonary nodule, life long non-smoker; She has severe GERD and is followed by Dr. Ewing Schlein.  She started on immunotherapy in 2017 She was born prematurely at 43 weeks.    Subjective:    Patient ID: Karen Barnes, female    DOB: 04-21-1960, 57 y.o.   MRN: [id number]  HPI Chief Complaint  Patient presents with  . Follow-up    still coughing but less mucous production.  Does feel better but some SOB lingers.Karen Barnes returns to clinic today for follow-up on the studies we arranged after the last visit.  She says that she still coughing up some white, mostly thick mucus.  She has not had fevers or chills since last visit.  She still has some sinus production of mucus and feels mucus draining down the back of her throat peer  She tells me she is still coughing up mostly white, sometime yellow mucus.  It sticks to her throat some.   She had pneumonia as a child.      Past Medical History:  Diagnosis Date  . Asthma, moderate persistent    sees Dr Irena Cords  . Diabetes mellitus    A1C 6.2---->  2009  . Family history of adverse reaction to anesthesia    sister had cardiac arrest with rhinoplasty  . Fibrocystic breast   . GERD (gastroesophageal reflux disease)   . HH (hiatus hernia) 09/2014   Dr. Ewing Schlein  . History of echocardiogram    Echo 5/19: EF 55-60, normal wall motion, PASP 41  . History of hiatal hernia   . History of nuclear stress test    Nuclear stress test 5/19:  EF 64, normal perfusion, Low risk  . Hot flashes   . Hyperlipidemia   . Normal nuclear stress test 2006  . Palpitations    PACs sees cards   . Recurrent cystitis    took  postcoital antibiotic for years, symptoms better after her hysterectomy 1996, symptoms re-surface in 2011  . RUQ abdominal pain 2016   EGD showed small HH, otherwise normal  . Wears glasses         Review of Systems  Constitutional: Positive for unexpected weight change. Negative for  fever.  HENT: Positive for congestion, postnasal drip and sinus pressure. Negative for dental problem, ear pain, nosebleeds, rhinorrhea, sneezing, sore throat and trouble swallowing.   Eyes: Negative for redness and itching.  Respiratory: Positive for cough. Negative for chest tightness, shortness of breath and wheezing.   Cardiovascular: Negative for palpitations and leg swelling.  Gastrointestinal: Positive for nausea. Negative for vomiting.  Genitourinary: Negative for dysuria.  Musculoskeletal: Negative for joint swelling.  Skin: Negative for rash.  Neurological: Negative for headaches.  Hematological: Does not bruise/bleed easily.  Psychiatric/Behavioral: Negative for dysphoric mood. The patient is not nervous/anxious.        Objective:   Physical Exam Vitals:   11/14/17 1440  BP: 120/70  Pulse: 86  SpO2: 100%  Weight: 180 lb 6.4 oz (81.8 kg)  Height: 5\' 4"  (1.626 m)   RA  Gen: well appearing HENT: OP clear, TM's clear, neck supple PULM: CTA B, normal percussion CV: RRR, no mgr, trace edema GI: BS+, soft, nontender Derm: no cyanosis or rash Psyche: normal mood and affect   FENO 10/2017 on symbicort while sick: 22 ppm  Chest imaging November 07, 2017 high-resolution CT scan of the chest images independently reviewed: Mild cylindrical bronchiectasis particularly  in the left lower lobe, some in the right lower lobe  Pulmonary function test: October 2019 ratio 90%, FVC 2.56 L 76% predicted, total lung capacity 4.32 L 84% predicted, DLCO 20.7 mL 84% predicted  10/18/2015 chest CT images personal reviewed showing normal pulmonary parenchyma with the exception of a 3.4 mm nodule in the left lower lobe.  August 2017 primary care office notes reviewed where she was seen for an asthma exacerbation. She took prednisone.  Multiple chest x-rays from this year reviewed showing no pulmonary parenchymal infiltrate, clear lungs  Visit from her note with [place] in 2019 reviewed where  she was seen for cough, Tessalon was prescribed     Assessment & Plan:   No diagnosis found.  Discussion: Kiyo has mild cylindrical bronchiectasis in the bases of her lungs which is the cause of her daily mucus production chest congestion.  The etiology of this is uncertain, I question whether or not it is related to a prior case of pneumonia.  However, considering her recurrent sinopulmonary infections I do question an immunodeficiency.  We will test for that today.  I explained to her today that she will cough every day for the rest of her life.  I also explained that the best approach moving forward is to work on maintenance of the daily chest congestion with a flutter valve and hypertonic saline.  Plan: Bronchiectasis: This is the medical term that describes enlarged airways.  This causes mucus production and recurrent respiratory infections. We need to help you clear the mucus out of your lungs by having you use a flutter valve: 10 breaths twice a day Use hypertonic saline twice a day Both of these measures are intended to help you clear mucus out If you have a fever, change in mucus color or increased chest congestion call right away so that we can call in a prescription for antibiotics Please provide [place] with another sample of your mucus so that we can test for infection  Chronic sinus infections: We will check the blood work today to look for evidence of an underlying immunodeficiency: CBC with differential, complement 4, complement 3, CH 50, immunoglobulin panel, 6 IgG subclass  Asthma: Continue Advair as you are doing  Pulmonary hypertension: We will refer you back to Dr. Shirlee Latch to consider a right heart catheterization  We will see you back in 4 to 6 weeks or sooner if needed  Greater than 50% of this 20-minute visit spent face-to-face         Current Outpatient Medications:  .  albuterol (PROVENTIL HFA;VENTOLIN HFA) 108 (90 BASE) MCG/ACT inhaler, Inhale 2 puffs  into the lungs every 6 (six) hours as needed., Disp: , Rfl:  .  benzonatate (TESSALON) 100 MG capsule, Take 1 capsule (100 mg total) by mouth 3 (three) times daily., Disp: 30 capsule, Rfl: 1 .  bifidobacterium infantis (ALIGN) capsule, Take 1 capsule by mouth daily., Disp: , Rfl:  .  budesonide-formoterol (SYMBICORT) 160-4.5 MCG/ACT inhaler, Inhale 2 puffs into the lungs 2 (two) times daily., Disp: , Rfl:  .  Calcium Carbonate-Vitamin D (CALCIUM 600+D) 600-200 MG-UNIT TABS, Take 1 tablet by mouth daily., Disp: , Rfl:  .  Continuous Blood Gluc Sensor (FREESTYLE LIBRE 14 DAY SENSOR) MISC, USE AS DIRECTED PER MD, Disp: 2 each, Rfl: 3 .  Continuous Blood Gluc Sensor (FREESTYLE LIBRE SENSOR SYSTEM) MISC, CHECK BLOOD SUGAR DAILY AS DIRECTED. CHANGE DEVICE EVERY 10 DAYS, Disp: 3 each, Rfl: 12 .  diltiazem (CARDIZEM) 30 MG tablet,  Take 1 tablet (30 mg total) by mouth every 6 (six) hours as needed (for palpitations)., Disp: 30 tablet, Rfl: 4 .  EPINEPHrine (AUVI-Q) 0.3 mg/0.3 mL IJ SOAJ injection, Inject 0.3 mg into the muscle as directed., Disp: , Rfl:  .  fexofenadine (ALLEGRA) 180 MG tablet, Take 180 mg by mouth every morning. , Disp: , Rfl:  .  fluticasone (FLONASE) 50 MCG/ACT nasal spray, Place 2 sprays into the nose daily. AND IF NEEDED 2 SPRAYS AT NIGHT, Disp: , Rfl:  .  furosemide (LASIX) 20 MG tablet, Take 1 tablet (20 mg total) by mouth daily as needed for fluid or edema., Disp: 30 tablet, Rfl: 3 .  hydrocortisone 2.5 % cream, APPLY EXTERNALLY TO THE AFFECTED AREA TWICE DAILY AS NEEDED, Disp: 30 g, Rfl: 0 .  metFORMIN (GLUCOPHAGE) 1000 MG tablet, TAKE 1&1/2 TABLETS BY MOUTH EVERY MORNING AND 1 TABLET EVERY EVENING (Patient taking differently: TAKE 1&1/2 (1500mg ) TABLETS BY MOUTH EVERY MORNING AND 1 TABLET (1000mg ) EVERY EVENING), Disp: 225 tablet, Rfl: 1 .  montelukast (SINGULAIR) 10 MG tablet, Take 10 mg by mouth at bedtime.  , Disp: , Rfl:  .  Multiple Vitamins-Minerals (MULTIVITAMIN WITH MINERALS)  tablet, Take 1 tablet by mouth daily., Disp: , Rfl:  .  nitrofurantoin, macrocrystal-monohydrate, (MACROBID) 100 MG capsule, TAKE AS DIRECTED POST COITAL, Disp: 30 capsule, Rfl: 0 .  ondansetron (ZOFRAN) 4 MG tablet, Take 1 tablet (4 mg total) by mouth every 8 (eight) hours as needed for nausea or vomiting., Disp: 30 tablet, Rfl: 0 .  pantoprazole (PROTONIX) 40 MG tablet, Take 1 tablet (40 mg total) by mouth daily., Disp: 30 tablet, Rfl: 3 .  predniSONE (DELTASONE) 10 MG tablet, Take 3 tabs for 2 days, then 2 tabs for 2 days, then 1 tab for 2 days, then stop, Disp: 12 tablet, Rfl: 0 .  predniSONE (STERAPRED UNI-PAK 48 TAB) 10 MG (48) TBPK tablet, See package insert for directions. (Patient taking differently: See package insert for directions. Ends on 9.7.919), Disp: 48 tablet, Rfl: 0 .  PRESCRIPTION MEDICATION, Allergy Shots, Disp: , Rfl:  .  promethazine (PHENERGAN) 25 MG suppository, Place 1 suppository (25 mg total) rectally every 6 (six) hours as needed for nausea or vomiting., Disp: 12 each, Rfl: 0 .  rosuvastatin (CRESTOR) 5 MG tablet, TAKE 1 TABLET(5 MG) BY MOUTH DAILY, Disp: 90 tablet, Rfl: 3 .  sitaGLIPtin (JANUVIA) 100 MG tablet, Take 1 tablet (100 mg total) by mouth daily., Disp: 90 tablet, Rfl: 1 .  SPIRIVA RESPIMAT 1.25 MCG/ACT AERS, Inhale 2 puffs into the lungs daily. , Disp: , Rfl: 5

## 2017-11-15 ENCOUNTER — Other Ambulatory Visit: Payer: Self-pay | Admitting: Emergency Medicine

## 2017-11-15 ENCOUNTER — Other Ambulatory Visit: Payer: Self-pay | Admitting: Obstetrics and Gynecology

## 2017-11-15 DIAGNOSIS — J479 Bronchiectasis, uncomplicated: Secondary | ICD-10-CM

## 2017-11-15 DIAGNOSIS — Z803 Family history of malignant neoplasm of breast: Secondary | ICD-10-CM

## 2017-11-15 LAB — TIQ-NTM

## 2017-11-16 ENCOUNTER — Telehealth (HOSPITAL_COMMUNITY): Payer: Self-pay

## 2017-11-16 ENCOUNTER — Ambulatory Visit (HOSPITAL_BASED_OUTPATIENT_CLINIC_OR_DEPARTMENT_OTHER)
Admission: RE | Admit: 2017-11-16 | Discharge: 2017-11-16 | Disposition: A | Discharge status: Home / Self Care | Payer: BC Managed Care – PPO | Source: Ambulatory Visit | Attending: Family Medicine | Admitting: Family Medicine

## 2017-11-16 DIAGNOSIS — E041 Nontoxic single thyroid nodule: Secondary | ICD-10-CM | POA: Insufficient documentation

## 2017-11-16 NOTE — Telephone Encounter (Signed)
Pt called set appointment time for Monday 10/14 at 12. Pt would like to schedule her heart cath for 10/21 at 8:30

## 2017-11-16 NOTE — Telephone Encounter (Signed)
-----   Message from Noralee Space, RN sent at 11/16/2017  3:56 PM EDT ----- Regarding: FW: CATH Hey can you call her please, if she wants an appt to see Dr Shirlee Latch she can come Mon 10/14 at 76, if she just wants to go ahead and sch the Right Heart Cath she can do that on 10/18 AM, 10/21, 10/24 or 10/28, thanks  ----- Message ----- From: Idelle Jo A Sent: 11/16/2017   1:19 PM EDT To: Noralee Space, RN Subject: CATH                                           Pt called to make appt w/ [facility] for heart cath she was referred by [facility], referral in epic pt wants appt ASAP, pt want some on to call her TODAY [facility]

## 2017-11-16 NOTE — Telephone Encounter (Signed)
Pt called to see if she wanted an appointment or to schedule her Cath below are dates that she can do her cath,  Hey can you call her please, if she wants an appt to see Dr Shirlee Latch she can come Mon 10/14 at 43, if she just wants to go ahead and sch the Right Heart Cath she can do that on 10/18 AM, 10/21, 10/24 or 10/28, thanks

## 2017-11-16 NOTE — Telephone Encounter (Signed)
Pt call to inform that heart cath is scheduled for 10/21 at 8:30, no answer pt has an appointment scheduled for 10/14 at noon can give pt her cath instructions at that appointment

## 2017-11-18 ENCOUNTER — Other Ambulatory Visit: Payer: Self-pay | Admitting: Family Medicine

## 2017-11-18 DIAGNOSIS — E041 Nontoxic single thyroid nodule: Secondary | ICD-10-CM

## 2017-11-19 ENCOUNTER — Other Ambulatory Visit: Payer: BC Managed Care – PPO

## 2017-11-19 ENCOUNTER — Other Ambulatory Visit (HOSPITAL_COMMUNITY): Payer: Self-pay | Admitting: *Deleted

## 2017-11-19 ENCOUNTER — Other Ambulatory Visit: Payer: Self-pay | Admitting: Family Medicine

## 2017-11-19 ENCOUNTER — Ambulatory Visit (HOSPITAL_COMMUNITY)
Admission: RE | Admit: 2017-11-19 | Discharge: 2017-11-19 | Disposition: A | Discharge status: Home / Self Care | Payer: BC Managed Care – PPO | Source: Ambulatory Visit | Attending: Cardiology | Admitting: Cardiology

## 2017-11-19 VITALS — BP 119/70 | HR 80 | Wt 179.2 lb

## 2017-11-19 DIAGNOSIS — E785 Hyperlipidemia, unspecified: Secondary | ICD-10-CM | POA: Insufficient documentation

## 2017-11-19 DIAGNOSIS — E041 Nontoxic single thyroid nodule: Secondary | ICD-10-CM

## 2017-11-19 DIAGNOSIS — Z79899 Other long term (current) drug therapy: Secondary | ICD-10-CM | POA: Diagnosis not present

## 2017-11-19 DIAGNOSIS — J479 Bronchiectasis, uncomplicated: Secondary | ICD-10-CM | POA: Insufficient documentation

## 2017-11-19 DIAGNOSIS — E119 Type 2 diabetes mellitus without complications: Secondary | ICD-10-CM | POA: Insufficient documentation

## 2017-11-19 DIAGNOSIS — R002 Palpitations: Secondary | ICD-10-CM | POA: Insufficient documentation

## 2017-11-19 DIAGNOSIS — J45909 Unspecified asthma, uncomplicated: Secondary | ICD-10-CM | POA: Insufficient documentation

## 2017-11-19 DIAGNOSIS — Z7982 Long term (current) use of aspirin: Secondary | ICD-10-CM | POA: Diagnosis not present

## 2017-11-19 DIAGNOSIS — K219 Gastro-esophageal reflux disease without esophagitis: Secondary | ICD-10-CM | POA: Insufficient documentation

## 2017-11-19 DIAGNOSIS — I471 Supraventricular tachycardia: Secondary | ICD-10-CM

## 2017-11-19 DIAGNOSIS — Z7984 Long term (current) use of oral hypoglycemic drugs: Secondary | ICD-10-CM | POA: Insufficient documentation

## 2017-11-19 DIAGNOSIS — I272 Pulmonary hypertension, unspecified: Secondary | ICD-10-CM

## 2017-11-19 NOTE — H&P (View-Only) (Signed)
PCP: Aron, Talia M, MD  Pulmonology: Dr. McQuaid HF Cardiology: Dr. Whitten Andreoni  57 yo with history of asthma, palpitations/PACs, bronchiectasis, and asthma was referred by Dr. McQuaid for evaluation of pulmonary hypertension.  Patient has been followed in pulmonary clinic for persistent asthma and bronchiectasis.  She had a Cardiolite in 5/19 with no ischemia or infarction by perfusion images.  Echo in 5/19 showed normal EF but mildly increased PA pressure, 41 mmHg.   She has a chronic cough thought to be due to bronchiectasis.  No chest pain.  Minimal palpitations recently.  Dyspnea waxes and wanes depending on activity of asthma and cough.  Generally able to walk on flat ground without dyspnea, can get short of breath with stairs/hills.  No orthopnea/PND.  No lightheadedness/syncope. No history of rheumatological disease. She does get some swelling in the ankles and is taking Lasix regularly.   ECG (5/19, personally reviewed): NSR, normal  Labs (8/19): LDL 75, K 3.9, creatinine 0.87  PMH: 1. GERD: severe 2. Asthma 3. Type 2 diabetes 4. Hyperlipidemia 5. Bronchiectasis: CT chest (10/19) with cylindrical bronchiectasis.  6. PACs: Event monitor in 3/19 with PACs, short runs of atrial tachycardia.  7. Sleep study in 7/19 with no sleep apnea.  8. Thyroid nodule 9. Cardiolite (5/19): EF 64%, normal perfusion images.  10. Echo (5/19): EF 55-60%, normal RV size and systolic function, PASP 41 mmHg.   Social History   Socioeconomic History  . Marital status: Married    Spouse name: Not on file  . Number of children: 1  . Years of education: College  . Highest education level: Not on file  Occupational History  . Occupation: retired principal  Social Needs  . Financial resource strain: Not on file  . Food insecurity:    Worry: Not on file    Inability: Not on file  . Transportation needs:    Medical: Not on file    Non-medical: Not on file  Tobacco Use  . Smoking status: Never Smoker   . Smokeless tobacco: Never Used  Substance and Sexual Activity  . Alcohol use: No  . Drug use: No  . Sexual activity: Yes  Lifestyle  . Physical activity:    Days per week: Not on file    Minutes per session: Not on file  . Stress: Not on file  Relationships  . Social connections:    Talks on phone: Not on file    Gets together: Not on file    Attends religious service: Not on file    Active member of club or organization: Not on file    Attends meetings of clubs or organizations: Not on file    Relationship status: Not on file  . Intimate partner violence:    Fear of current or ex partner: Not on file    Emotionally abused: Not on file    Physically abused: Not on file    Forced sexual activity: Not on file  Other Topics Concern  . Not on file  Social History Narrative   Household-- pt and husband    Rare caffeine use   Daughter independent, 1 G-child   Caregiver for F and M, both w/ dementia   Family History  Problem Relation Age of Onset  . Mitral valve prolapse Mother   . Breast cancer Mother 63       M had ca insitu (age 60), aunt   . Dementia Mother   . Heart disease Father 40         early   . Diabetes Father   . Kidney failure Father   . Sleep apnea Father   . Brain cancer Other        2 fam members   . Lung cancer Other        uncle, heavy smoker  . Breast cancer Maternal Aunt 60  . CAD Paternal Grandmother 40  . CAD Paternal Grandfather 40  . Colon cancer Neg Hx    ROS: All systems reviewed and negative except as per HPI.   Current Outpatient Medications  Medication Sig Dispense Refill  . albuterol (PROVENTIL HFA;VENTOLIN HFA) 108 (90 BASE) MCG/ACT inhaler Inhale 2 puffs into the lungs every 6 (six) hours as needed.    . benzonatate (TESSALON) 100 MG capsule Take 1 capsule (100 mg total) by mouth 3 (three) times daily. 30 capsule 1  . bifidobacterium infantis (ALIGN) capsule Take 1 capsule by mouth daily.    . budesonide-formoterol (SYMBICORT)  160-4.5 MCG/ACT inhaler Inhale 2 puffs into the lungs 2 (two) times daily.    . Calcium Carbonate-Vitamin D (CALCIUM 600+D) 600-200 MG-UNIT TABS Take 1 tablet by mouth daily.    . Continuous Blood Gluc Sensor (FREESTYLE LIBRE 14 DAY SENSOR) MISC USE AS DIRECTED PER MD 2 each 3  . Continuous Blood Gluc Sensor (FREESTYLE LIBRE SENSOR SYSTEM) MISC CHECK BLOOD SUGAR DAILY AS DIRECTED. CHANGE DEVICE EVERY 10 DAYS 3 each 12  . diltiazem (CARDIZEM) 30 MG tablet Take 1 tablet (30 mg total) by mouth every 6 (six) hours as needed (for palpitations). 30 tablet 4  . EPINEPHrine (AUVI-Q) 0.3 mg/0.3 mL IJ SOAJ injection Inject 0.3 mg into the muscle as directed. For immunotherapy (allergy shots) anaphylactic reactions    . fexofenadine (ALLEGRA) 180 MG tablet Take 180 mg by mouth every morning.     . fluticasone (FLONASE) 50 MCG/ACT nasal spray Place 2 sprays into both nostrils 2 (two) times daily.     . furosemide (LASIX) 20 MG tablet Take 1 tablet (20 mg total) by mouth daily as needed for fluid or edema. (Patient taking differently: Take 20 mg by mouth daily. ) 30 tablet 3  . hydrocortisone 2.5 % cream APPLY EXTERNALLY TO THE AFFECTED AREA TWICE DAILY AS NEEDED (Patient taking differently: Apply 1 application topically 2 (two) times daily as needed (for skin irritation/rash). ) 30 g 0  . metFORMIN (GLUCOPHAGE) 1000 MG tablet TAKE 1&1/2 TABLETS BY MOUTH EVERY MORNING AND 1 TABLET EVERY EVENING (Patient taking differently: Take 1,000-1,500 mg by mouth See admin instructions. TAKE 1&1/2 (1500mg) TABLETS BY MOUTH EVERY MORNING AND 1 TABLET (1000mg) EVERY EVENING) 225 tablet 1  . montelukast (SINGULAIR) 10 MG tablet Take 10 mg by mouth at bedtime.      . Multiple Vitamins-Minerals (MULTIVITAMIN WITH MINERALS) tablet Take 1 tablet by mouth daily.    . nitrofurantoin, macrocrystal-monohydrate, (MACROBID) 100 MG capsule TAKE AS DIRECTED POST COITAL (Patient taking differently: Take 100 mg by mouth daily as needed (post  coital). ) 30 capsule 0  . ondansetron (ZOFRAN) 4 MG tablet Take 1 tablet (4 mg total) by mouth every 8 (eight) hours as needed for nausea or vomiting. 30 tablet 0  . pantoprazole (PROTONIX) 40 MG tablet Take 1 tablet (40 mg total) by mouth daily. 30 tablet 3  . PRESCRIPTION MEDICATION Allergy Shots    . promethazine (PHENERGAN) 25 MG suppository Place 1 suppository (25 mg total) rectally every 6 (six) hours as needed for nausea or vomiting. (Patient not taking: Reported on 11/19/2017)   12 each 0  . rosuvastatin (CRESTOR) 5 MG tablet TAKE 1 TABLET(5 MG) BY MOUTH DAILY 90 tablet 3  . sitaGLIPtin (JANUVIA) 100 MG tablet Take 1 tablet (100 mg total) by mouth daily. 90 tablet 1  . sodium chloride HYPERTONIC 3 % nebulizer solution Take by nebulization 2 (two) times daily. 300 mL 11  . SPIRIVA RESPIMAT 1.25 MCG/ACT AERS Inhale 2 puffs into the lungs daily.   5  . albuterol (PROVENTIL) (2.5 MG/3ML) 0.083% nebulizer solution Take 2.5 mg by nebulization every 6 (six) hours as needed for wheezing or shortness of breath.    . aspirin EC 81 MG tablet Take 81 mg by mouth daily.    . docusate sodium (COLACE) 100 MG capsule Take 100 mg by mouth 2 (two) times daily.    . olopatadine (PATANOL) 0.1 % ophthalmic solution Place 1 drop into both eyes 2 (two) times daily as needed for allergies.    . omeprazole (PRILOSEC) 40 MG capsule Take 40 mg by mouth at bedtime.     No current facility-administered medications for this encounter.    BP 119/70   Pulse 80   Wt 81.3 kg (179 lb 3.2 oz)   SpO2 100%   BMI 30.76 kg/m  General: NAD Neck: No JVD, no thyromegaly or thyroid nodule.  Lungs: Clear to auscultation bilaterally with normal respiratory effort. CV: Nondisplaced PMI.  Heart regular S1/S2, no S3/S4, no murmur.  No peripheral edema.  No carotid bruit.  Normal pedal pulses.  Abdomen: Soft, nontender, no hepatosplenomegaly, no distention.  Skin: Intact without lesions or rashes.  Neurologic: Alert and oriented  x 3.  Psych: Normal affect. Extremities: No clubbing or cyanosis.  HEENT: Normal.   Assessment/Plan: 1. Pulmonary hypertension: PA pressure estimate mildly elevated on echo.  She has NYHA class II symptoms.  It is possible that her dyspnea is due to asthma and bronchiectasis, but reasonable to rule out pulmonary hypertension. If she does have significant PAH, will need to evaluate for etiologies.  She has no known rheumatological diagnoses. 7/19 sleep study was negative for OSA. She is not volume overloaded on exam.   - I will arrange for RHC.  We discussed risks/benefits and she agrees to proceed.  2. Palpitations: History of PACs, brief runs of atrial tachycardia.  She is not taking a nodal blocker regularly but has had minimal palpitations recently.  3. Asthma/bronchiectasis: Per Dr. McQuaid.  4. Thyroid nodule: FNA planned.  5. Hyperlipidemia: Good lipids on Crestor in 7/19.   Karen Barnes 11/19/2017   

## 2017-11-19 NOTE — Patient Instructions (Addendum)
Follow up AS NEEDED, pending your catheterization results   You are scheduled for a Cardiac Catheterization on Monday, October 21 with Dr. Marca Ancona.  1. Please arrive at the [facility] (Main Entrance A) at [facility]: [address] [place], [place] [address] at 6:30 AM (This time is two hours before your procedure to ensure your preparation). Free valet parking service is available.   Special note: Every effort is made to have your procedure done on time. Please understand that emergencies sometimes delay scheduled procedures.  2. Diet: Do not eat solid foods after midnight.  The patient may have clear liquids until 5am upon the day of the procedure.  3. Labs: DONE LAST WEEK  4. Medication instructions in preparation for your procedure:   Contrast Allergy: No  DO NOT TAKE AM MEDS MORNING OF CATHETERIZATION   5. Plan for one night stay--bring personal belongings. 6. Bring a current list of your medications and current insurance cards. 7. You MUST have a responsible person to drive you home. 8. Someone MUST be with you the first 24 hours after you arrive home or your discharge will be delayed. 9. Please wear clothes that are easy to get on and off and wear slip-on shoes.  Thank you for allowing [place] to care for you!   -- [facility]

## 2017-11-19 NOTE — Progress Notes (Signed)
PCP: Dianne Dun, MD  Pulmonology: Dr. Kendrick Fries HF Cardiology: Dr. Shirlee Latch  57 yo with history of asthma, palpitations/PACs, bronchiectasis, and asthma was referred by Dr. Kendrick Fries for evaluation of pulmonary hypertension.  Patient has been followed in pulmonary clinic for persistent asthma and bronchiectasis.  She had a Cardiolite in 5/19 with no ischemia or infarction by perfusion images.  Echo in 5/19 showed normal EF but mildly increased PA pressure, 41 mmHg.   She has a chronic cough thought to be due to bronchiectasis.  No chest pain.  Minimal palpitations recently.  Dyspnea waxes and wanes depending on activity of asthma and cough.  Generally able to walk on flat ground without dyspnea, can get short of breath with stairs/hills.  No orthopnea/PND.  No lightheadedness/syncope. No history of rheumatological disease. She does get some swelling in the ankles and is taking Lasix regularly.   ECG (5/19, personally reviewed): NSR, normal  Labs (8/19): LDL 75, K 3.9, creatinine 1.61  PMH: 1. GERD: severe 2. Asthma 3. Type 2 diabetes 4. Hyperlipidemia 5. Bronchiectasis: CT chest (10/19) with cylindrical bronchiectasis.  6. PACs: Event monitor in 3/19 with PACs, short runs of atrial tachycardia.  7. Sleep study in 7/19 with no sleep apnea.  8. Thyroid nodule 9. Cardiolite (5/19): EF 64%, normal perfusion images.  10. Echo (5/19): EF 55-60%, normal RV size and systolic function, PASP 41 mmHg.   Social History   Socioeconomic History  . Marital status: Married    Spouse name: Not on file  . Number of children: 1  . Years of education: College  . Highest education level: Not on file  Occupational History  . Occupation: retired principal  Engineer, production  . Financial resource strain: Not on file  . Food insecurity:    Worry: Not on file    Inability: Not on file  . Transportation needs:    Medical: Not on file    Non-medical: Not on file  Tobacco Use  . Smoking status: Never Smoker   . Smokeless tobacco: Never Used  Substance and Sexual Activity  . Alcohol use: No  . Drug use: No  . Sexual activity: Yes  Lifestyle  . Physical activity:    Days per week: Not on file    Minutes per session: Not on file  . Stress: Not on file  Relationships  . Social connections:    Talks on phone: Not on file    Gets together: Not on file    Attends religious service: Not on file    Active member of club or organization: Not on file    Attends meetings of clubs or organizations: Not on file    Relationship status: Not on file  . Intimate partner violence:    Fear of current or ex partner: Not on file    Emotionally abused: Not on file    Physically abused: Not on file    Forced sexual activity: Not on file  Other Topics Concern  . Not on file  Social History Narrative   Household-- pt and husband    Rare caffeine use   Daughter independent, 1 G-child   Engineer, structural for F and M, both w/ dementia   Family History  Problem Relation Age of Onset  . Mitral valve prolapse Mother   . Breast cancer Mother 24       M had ca insitu (age 26), aunt   . Dementia Mother   . Heart disease Father 56  early   . Diabetes Father   . Kidney failure Father   . Sleep apnea Father   . Brain cancer Other        2 fam members   . Lung cancer Other        uncle, heavy smoker  . Breast cancer Maternal Aunt 60  . CAD Paternal Grandmother 30  . CAD Paternal Grandfather 49  . Colon cancer Neg Hx    ROS: All systems reviewed and negative except as per HPI.   Current Outpatient Medications  Medication Sig Dispense Refill  . albuterol (PROVENTIL HFA;VENTOLIN HFA) 108 (90 BASE) MCG/ACT inhaler Inhale 2 puffs into the lungs every 6 (six) hours as needed.    . benzonatate (TESSALON) 100 MG capsule Take 1 capsule (100 mg total) by mouth 3 (three) times daily. 30 capsule 1  . bifidobacterium infantis (ALIGN) capsule Take 1 capsule by mouth daily.    . budesonide-formoterol (SYMBICORT)  160-4.5 MCG/ACT inhaler Inhale 2 puffs into the lungs 2 (two) times daily.    . Calcium Carbonate-Vitamin D (CALCIUM 600+D) 600-200 MG-UNIT TABS Take 1 tablet by mouth daily.    . Continuous Blood Gluc Sensor (FREESTYLE LIBRE 14 DAY SENSOR) MISC USE AS DIRECTED PER MD 2 each 3  . Continuous Blood Gluc Sensor (FREESTYLE LIBRE SENSOR SYSTEM) MISC CHECK BLOOD SUGAR DAILY AS DIRECTED. CHANGE DEVICE EVERY 10 DAYS 3 each 12  . diltiazem (CARDIZEM) 30 MG tablet Take 1 tablet (30 mg total) by mouth every 6 (six) hours as needed (for palpitations). 30 tablet 4  . EPINEPHrine (AUVI-Q) 0.3 mg/0.3 mL IJ SOAJ injection Inject 0.3 mg into the muscle as directed. For immunotherapy (allergy shots) anaphylactic reactions    . fexofenadine (ALLEGRA) 180 MG tablet Take 180 mg by mouth every morning.     . fluticasone (FLONASE) 50 MCG/ACT nasal spray Place 2 sprays into both nostrils 2 (two) times daily.     . furosemide (LASIX) 20 MG tablet Take 1 tablet (20 mg total) by mouth daily as needed for fluid or edema. (Patient taking differently: Take 20 mg by mouth daily. ) 30 tablet 3  . hydrocortisone 2.5 % cream APPLY EXTERNALLY TO THE AFFECTED AREA TWICE DAILY AS NEEDED (Patient taking differently: Apply 1 application topically 2 (two) times daily as needed (for skin irritation/rash). ) 30 g 0  . metFORMIN (GLUCOPHAGE) 1000 MG tablet TAKE 1&1/2 TABLETS BY MOUTH EVERY MORNING AND 1 TABLET EVERY EVENING (Patient taking differently: Take 1,000-1,500 mg by mouth See admin instructions. TAKE 1&1/2 (1500mg ) TABLETS BY MOUTH EVERY MORNING AND 1 TABLET (1000mg ) EVERY EVENING) 225 tablet 1  . montelukast (SINGULAIR) 10 MG tablet Take 10 mg by mouth at bedtime.      . Multiple Vitamins-Minerals (MULTIVITAMIN WITH MINERALS) tablet Take 1 tablet by mouth daily.    . nitrofurantoin, macrocrystal-monohydrate, (MACROBID) 100 MG capsule TAKE AS DIRECTED POST COITAL (Patient taking differently: Take 100 mg by mouth daily as needed (post  coital). ) 30 capsule 0  . ondansetron (ZOFRAN) 4 MG tablet Take 1 tablet (4 mg total) by mouth every 8 (eight) hours as needed for nausea or vomiting. 30 tablet 0  . pantoprazole (PROTONIX) 40 MG tablet Take 1 tablet (40 mg total) by mouth daily. 30 tablet 3  . PRESCRIPTION MEDICATION Allergy Shots    . promethazine (PHENERGAN) 25 MG suppository Place 1 suppository (25 mg total) rectally every 6 (six) hours as needed for nausea or vomiting. (Patient not taking: Reported on 11/19/2017)  12 each 0  . rosuvastatin (CRESTOR) 5 MG tablet TAKE 1 TABLET(5 MG) BY MOUTH DAILY 90 tablet 3  . sitaGLIPtin (JANUVIA) 100 MG tablet Take 1 tablet (100 mg total) by mouth daily. 90 tablet 1  . sodium chloride HYPERTONIC 3 % nebulizer solution Take by nebulization 2 (two) times daily. 300 mL 11  . SPIRIVA RESPIMAT 1.25 MCG/ACT AERS Inhale 2 puffs into the lungs daily.   5  . albuterol (PROVENTIL) (2.5 MG/3ML) 0.083% nebulizer solution Take 2.5 mg by nebulization every 6 (six) hours as needed for wheezing or shortness of breath.    Marland Kitchen aspirin EC 81 MG tablet Take 81 mg by mouth daily.    Marland Kitchen docusate sodium (COLACE) 100 MG capsule Take 100 mg by mouth 2 (two) times daily.    Marland Kitchen olopatadine (PATANOL) 0.1 % ophthalmic solution Place 1 drop into both eyes 2 (two) times daily as needed for allergies.    Marland Kitchen omeprazole (PRILOSEC) 40 MG capsule Take 40 mg by mouth at bedtime.     No current facility-administered medications for this encounter.    BP 119/70   Pulse 80   Wt 81.3 kg (179 lb 3.2 oz)   SpO2 100%   BMI 30.76 kg/m  General: NAD Neck: No JVD, no thyromegaly or thyroid nodule.  Lungs: Clear to auscultation bilaterally with normal respiratory effort. CV: Nondisplaced PMI.  Heart regular S1/S2, no S3/S4, no murmur.  No peripheral edema.  No carotid bruit.  Normal pedal pulses.  Abdomen: Soft, nontender, no hepatosplenomegaly, no distention.  Skin: Intact without lesions or rashes.  Neurologic: Alert and oriented  x 3.  Psych: Normal affect. Extremities: No clubbing or cyanosis.  HEENT: Normal.   Assessment/Plan: 1. Pulmonary hypertension: PA pressure estimate mildly elevated on echo.  She has NYHA class II symptoms.  It is possible that her dyspnea is due to asthma and bronchiectasis, but reasonable to rule out pulmonary hypertension. If she does have significant PAH, will need to evaluate for etiologies.  She has no known rheumatological diagnoses. 7/19 sleep study was negative for OSA. She is not volume overloaded on exam.   - I will arrange for RHC.  We discussed risks/benefits and she agrees to proceed.  2. Palpitations: History of PACs, brief runs of atrial tachycardia.  She is not taking a nodal blocker regularly but has had minimal palpitations recently.  3. Asthma/bronchiectasis: Per Dr. Kendrick Fries.  4. Thyroid nodule: FNA planned.  5. Hyperlipidemia: Good lipids on Crestor in 7/19.   Marca Ancona 11/19/2017

## 2017-11-21 LAB — TEST AUTHORIZATION

## 2017-11-21 LAB — COMPLEMENT, TOTAL

## 2017-11-21 LAB — IGG SUBCLASSES
IGG SUBCLASS 2: 380 mg/dL (ref 241–700)
IGG SUBCLASS 3: 27 mg/dL (ref 22–178)
IMMUNOGLOBULIN G, SERUM: 850 mg/dL (ref 600–1640)
IgG Subclass 1: 429 mg/dL (ref 382–929)
IgG Subclass 4: 22.4 mg/dL (ref 4–86)

## 2017-11-21 LAB — C3 AND C4
C3 Complement: 158 mg/dL (ref 83–193)
C4 COMPLEMENT: 43 mg/dL (ref 15–57)

## 2017-11-21 LAB — ALPHA-1 ANTITRYPSIN PHENOTYPE: A-1 Antitrypsin, Ser: 110 mg/dL (ref 83–199)

## 2017-11-21 LAB — MYELOPEROXIDASE AUTO ABS: Myeloperoxidase Abs: <1 AI

## 2017-11-23 ENCOUNTER — Ambulatory Visit
Admission: RE | Admit: 2017-11-23 | Discharge: 2017-11-23 | Disposition: A | Discharge status: Home / Self Care | Payer: BC Managed Care – PPO | Source: Ambulatory Visit | Attending: Obstetrics and Gynecology | Admitting: Obstetrics and Gynecology

## 2017-11-23 ENCOUNTER — Ambulatory Visit: Payer: BC Managed Care – PPO | Admitting: Internal Medicine

## 2017-11-23 DIAGNOSIS — Z803 Family history of malignant neoplasm of breast: Secondary | ICD-10-CM

## 2017-11-23 MED ORDER — GADOBENATE DIMEGLUMINE 529 MG/ML IV SOLN
16.0000 mL | Freq: Once | INTRAVENOUS | Status: AC | PRN
  Administered 2017-11-23: 16 mL via INTRAVENOUS

## 2017-11-26 ENCOUNTER — Ambulatory Visit (HOSPITAL_COMMUNITY)
Admission: RE | Admit: 2017-11-26 | Discharge: 2017-11-26 | Disposition: A | Discharge status: Home / Self Care | Payer: BC Managed Care – PPO | Source: Ambulatory Visit | Attending: Cardiology | Admitting: Cardiology

## 2017-11-26 ENCOUNTER — Encounter (HOSPITAL_COMMUNITY)
Admission: RE | Disposition: A | Discharge status: Home / Self Care | Payer: Self-pay | Source: Ambulatory Visit | Attending: Cardiology

## 2017-11-26 ENCOUNTER — Encounter (HOSPITAL_COMMUNITY): Payer: Self-pay | Admitting: Cardiology

## 2017-11-26 DIAGNOSIS — I272 Pulmonary hypertension, unspecified: Secondary | ICD-10-CM | POA: Insufficient documentation

## 2017-11-26 DIAGNOSIS — Z7951 Long term (current) use of inhaled steroids: Secondary | ICD-10-CM | POA: Insufficient documentation

## 2017-11-26 DIAGNOSIS — Z8249 Family history of ischemic heart disease and other diseases of the circulatory system: Secondary | ICD-10-CM | POA: Diagnosis not present

## 2017-11-26 DIAGNOSIS — Z7984 Long term (current) use of oral hypoglycemic drugs: Secondary | ICD-10-CM | POA: Diagnosis not present

## 2017-11-26 DIAGNOSIS — E041 Nontoxic single thyroid nodule: Secondary | ICD-10-CM | POA: Insufficient documentation

## 2017-11-26 DIAGNOSIS — E785 Hyperlipidemia, unspecified: Secondary | ICD-10-CM | POA: Diagnosis not present

## 2017-11-26 DIAGNOSIS — E119 Type 2 diabetes mellitus without complications: Secondary | ICD-10-CM | POA: Insufficient documentation

## 2017-11-26 DIAGNOSIS — J45909 Unspecified asthma, uncomplicated: Secondary | ICD-10-CM | POA: Insufficient documentation

## 2017-11-26 DIAGNOSIS — K219 Gastro-esophageal reflux disease without esophagitis: Secondary | ICD-10-CM | POA: Diagnosis not present

## 2017-11-26 DIAGNOSIS — I471 Supraventricular tachycardia: Secondary | ICD-10-CM | POA: Insufficient documentation

## 2017-11-26 DIAGNOSIS — J479 Bronchiectasis, uncomplicated: Secondary | ICD-10-CM | POA: Diagnosis not present

## 2017-11-26 DIAGNOSIS — Z7982 Long term (current) use of aspirin: Secondary | ICD-10-CM | POA: Insufficient documentation

## 2017-11-26 HISTORY — PX: RIGHT HEART CATH: CATH118263

## 2017-11-26 LAB — CBC
HEMATOCRIT: 36.2 % (ref 36.0–46.0)
Hemoglobin: 11 g/dL — ABNORMAL LOW (ref 12.0–15.0)
MCH: 26.5 pg (ref 26.0–34.0)
MCHC: 30.4 g/dL (ref 30.0–36.0)
MCV: 87.2 fL (ref 80.0–100.0)
PLATELETS: 292 10*3/uL (ref 150–400)
RBC: 4.15 MIL/uL (ref 3.87–5.11)
RDW: 14.8 % (ref 11.5–15.5)
WBC: 5 10*3/uL (ref 4.0–10.5)
nRBC: 0 % (ref 0.0–0.2)

## 2017-11-26 LAB — POCT I-STAT 3, VENOUS BLOOD GAS (G3P V)
ACID-BASE EXCESS: 1 mmol/L (ref 0.0–2.0)
Acid-Base Excess: 1 mmol/L (ref 0.0–2.0)
Bicarbonate: 26.8 mmol/L (ref 20.0–28.0)
Bicarbonate: 27 mmol/L (ref 20.0–28.0)
O2 SAT: 80 %
O2 Saturation: 76 %
TCO2: 28 mmol/L (ref 22–32)
TCO2: 28 mmol/L (ref 22–32)
pCO2, Ven: 45.3 mmHg (ref 44.0–60.0)
pCO2, Ven: 46.4 mmHg (ref 44.0–60.0)
pH, Ven: 7.372 (ref 7.250–7.430)
pH, Ven: 7.38 (ref 7.250–7.430)
pO2, Ven: 42 mmHg (ref 32.0–45.0)
pO2, Ven: 46 mmHg — ABNORMAL HIGH (ref 32.0–45.0)

## 2017-11-26 LAB — BASIC METABOLIC PANEL
Anion gap: 8 (ref 5–15)
BUN: 16 mg/dL (ref 6–20)
CALCIUM: 9.2 mg/dL (ref 8.9–10.3)
CO2: 28 mmol/L (ref 22–32)
CREATININE: 0.69 mg/dL (ref 0.44–1.00)
Chloride: 103 mmol/L (ref 98–111)
GFR calc Af Amer: >60 mL/min (ref >60)
GLUCOSE: 137 mg/dL — AB (ref 70–99)
Potassium: 4.1 mmol/L (ref 3.5–5.1)
Sodium: 139 mmol/L (ref 135–145)

## 2017-11-26 LAB — GLUCOSE, CAPILLARY: Glucose-Capillary: 137 mg/dL — ABNORMAL HIGH (ref 70–99)

## 2017-11-26 SURGERY — RIGHT HEART CATH
Anesthesia: LOCAL

## 2017-11-26 MED ORDER — SODIUM CHLORIDE 0.9 % IV SOLN: 250.0000 mL | INTRAVENOUS | Status: DC | PRN

## 2017-11-26 MED ORDER — LIDOCAINE HCL (PF) 1 % IJ SOLN
INTRAMUSCULAR | Status: DC | PRN
  Administered 2017-11-26: 2 mL via INTRADERMAL

## 2017-11-26 MED ORDER — ASPIRIN 81 MG PO CHEW
CHEWABLE_TABLET | ORAL | Status: AC
  Filled 2017-11-26: qty 1

## 2017-11-26 MED ORDER — SODIUM CHLORIDE 0.9% FLUSH: 3.0000 mL | Freq: Two times a day (BID) | INTRAVENOUS | Status: DC

## 2017-11-26 MED ORDER — FENTANYL CITRATE (PF) 100 MCG/2ML IJ SOLN
INTRAMUSCULAR | Status: AC
  Filled 2017-11-26: qty 2

## 2017-11-26 MED ORDER — MIDAZOLAM HCL 2 MG/2ML IJ SOLN
INTRAMUSCULAR | Status: AC
  Filled 2017-11-26: qty 2

## 2017-11-26 MED ORDER — SODIUM CHLORIDE 0.9% FLUSH: 3.0000 mL | INTRAVENOUS | Status: DC | PRN

## 2017-11-26 MED ORDER — FENTANYL CITRATE (PF) 100 MCG/2ML IJ SOLN
INTRAMUSCULAR | Status: DC | PRN
  Administered 2017-11-26: 25 ug via INTRAVENOUS

## 2017-11-26 MED ORDER — ASPIRIN 81 MG PO CHEW
81.0000 mg | CHEWABLE_TABLET | ORAL | Status: AC
  Administered 2017-11-26: 81 mg via ORAL

## 2017-11-26 MED ORDER — HEPARIN (PORCINE) IN NACL 1000-0.9 UT/500ML-% IV SOLN
INTRAVENOUS | Status: DC | PRN
  Administered 2017-11-26: 500 mL

## 2017-11-26 MED ORDER — MIDAZOLAM HCL 2 MG/2ML IJ SOLN
INTRAMUSCULAR | Status: DC | PRN
  Administered 2017-11-26: 1 mg via INTRAVENOUS

## 2017-11-26 MED ORDER — HEPARIN (PORCINE) IN NACL 1000-0.9 UT/500ML-% IV SOLN
INTRAVENOUS | Status: AC
  Filled 2017-11-26: qty 500

## 2017-11-26 MED ORDER — SODIUM CHLORIDE 0.9 % IV SOLN: INTRAVENOUS | Status: DC

## 2017-11-26 MED ORDER — LIDOCAINE HCL (PF) 1 % IJ SOLN
INTRAMUSCULAR | Status: AC
  Filled 2017-11-26: qty 30

## 2017-11-26 SURGICAL SUPPLY — 7 items
CATH BALLN WEDGE 5F 110CM (CATHETERS) ×1 IMPLANT
KIT HEART LEFT (KITS) ×2 IMPLANT
PACK CARDIAC CATHETERIZATION (CUSTOM PROCEDURE TRAY) ×2 IMPLANT
PROTECTION STATION PRESSURIZED (MISCELLANEOUS) ×2
SHEATH GLIDE SLENDER 4/5FR (SHEATH) ×1 IMPLANT
STATION PROTECTION PRESSURIZED (MISCELLANEOUS) IMPLANT
TRANSDUCER W/STOPCOCK (MISCELLANEOUS) ×2 IMPLANT

## 2017-11-26 NOTE — Discharge Instructions (Signed)
Care After This sheet gives you information about how to care for yourself after your procedure. Your health care provider may also give you more specific instructions. If you have problems or questions, contact your health care provider. What can I expect after the procedure? After the procedure, it is common to have:  Bruising or mild discomfort in the area where the IV was inserted (insertion site).  Follow these instructions at home: Eating and drinking  Follow instructions from your health care provider about eating or drinking restrictions.  Drink a lot of fluids for the first several days after the procedure, as directed by your health care provider. This helps to wash (flush) the contrast out of your body. Examples of healthy fluids include water or low-calorie drinks. General instructions  Check your IV insertion area every day for signs of infection. Check for: ? Redness, swelling, or pain. ? Fluid or blood. ? Warmth. ? Pus or a bad smell.  Take over-the-counter and prescription medicines only as told by your health care provider.  Rest and return to your normal activities as told by your health care provider. Ask your health care provider what activities are safe for you.  Do not drive for 24 hours if you were given a medicine to help you relax (sedative), or until your health care provider approves.  Keep all follow-up visits as told by your health care provider. This is important. Contact a health care provider if:  Your skin becomes itchy or you develop a rash or hives.  You have a fever that does not get better with medicine.  You feel nauseous.  You vomit.  You have redness, swelling, or pain around the insertion site.  You have fluid or blood coming from the insertion site.  Your insertion area feels warm to the touch.  You have pus or a bad smell coming from the insertion site. Get help right away if:  You have difficulty breathing or shortness of  breath.  You develop chest pain.  You faint.  You feel very dizzy. These symptoms may represent a serious problem that is an emergency. Do not wait to see if the symptoms will go away. Get medical help right away. Call your local emergency services (911 in the [place] Do not drive yourself to the hospital. Summary  After your procedure, it is common to have bruising or mild discomfort in the area where the IV was inserted.  You should check your IV insertion area every day for signs of infection.  Take over-the-counter and prescription medicines only as told by your health care provider.  You should drink a lot of fluids for the first several days after the procedure to help flush the contrast from your body. This information is not intended to replace advice given to you by your health care provider. Make sure you discuss any questions you have with your health care provider. Document Released: 11/13/2012 Document Revised: 12/18/2015 Document Reviewed: 12/18/2015 Elsevier Interactive Patient Education  2017 [employer].

## 2017-11-26 NOTE — Interval H&P Note (Signed)
History and Physical Interval Note:  11/26/2017 9:06 AM  Karen Barnes  has presented today for surgery, with the diagnosis of Pulmonary HTN  The various methods of treatment have been discussed with the patient and family. After consideration of risks, benefits and other options for treatment, the patient has consented to  Procedure(s): RIGHT HEART CATH (N/A) as a surgical intervention .  The patient's history has been reviewed, patient examined, no change in status, stable for surgery.  I have reviewed the patient's chart and labs.  Questions were answered to the patient's satisfaction.     Karis Emig [employer]

## 2017-11-27 ENCOUNTER — Telehealth: Payer: Self-pay | Admitting: Pulmonary Disease

## 2017-11-27 NOTE — Telephone Encounter (Signed)
OK 

## 2017-11-27 NOTE — Telephone Encounter (Signed)
Received call report on pt on sputum culture submitted on 11/19/17  Smear was neg for AFB  There was AFB present in liquid culture media- prelim (awaiting identification) Will forward to BQ as FYI

## 2017-11-27 NOTE — Telephone Encounter (Signed)
Noted by triage.  

## 2017-11-28 ENCOUNTER — Telehealth: Payer: Self-pay | Admitting: Pulmonary Disease

## 2017-11-28 NOTE — Telephone Encounter (Signed)
Yes that is fine, she won't absorb it.

## 2017-11-28 NOTE — Telephone Encounter (Signed)
Spoke with patient. She stated that she had a right heart cath on 11/26/17 by Dr. Shirlee Latch at Cardiology. She was advised afterwards to limit her salt intake, exercise and lose weight. She wants to know if she is ok to use the sodium chloride solution in her neb. Machine. Advised patient that she should be ok since she is not consuming the solution but I would check with BQ to be safe.    BQ, please advise if she can continue the sodium chloride. Thanks!

## 2017-11-29 NOTE — Telephone Encounter (Signed)
Called and spoke with pt letting her know that it would be fine for her to continue using her sodium chloride neb sol.  Pt expressed understanding. Nothing further needed.

## 2017-12-04 ENCOUNTER — Ambulatory Visit
Admission: RE | Admit: 2017-12-04 | Discharge: 2017-12-04 | Disposition: A | Discharge status: Home / Self Care | Payer: BC Managed Care – PPO | Source: Ambulatory Visit | Attending: Family Medicine | Admitting: Family Medicine

## 2017-12-04 ENCOUNTER — Other Ambulatory Visit (HOSPITAL_COMMUNITY)
Admission: RE | Admit: 2017-12-04 | Discharge: 2017-12-04 | Disposition: A | Discharge status: Home / Self Care | Payer: BC Managed Care – PPO | Source: Ambulatory Visit | Attending: Physician Assistant | Admitting: Physician Assistant

## 2017-12-04 DIAGNOSIS — E041 Nontoxic single thyroid nodule: Secondary | ICD-10-CM

## 2017-12-04 NOTE — Procedures (Signed)
PROCEDURE SUMMARY:  Using direct ultrasound guidance, 5 passes were made using 25 g needles into the nodule within the right lobe of the thyroid.   Ultrasound was used to confirm needle placements on all occasions.   Specimens were sent to Pathology for analysis.  See procedure note under Imaging tab in Epic for full procedure details.  Natasia Sanko S Eletha Culbertson PA-C 12/04/2017 4:29 PM

## 2017-12-06 ENCOUNTER — Other Ambulatory Visit: Payer: Self-pay | Admitting: Internal Medicine

## 2017-12-07 LAB — MYCOBACTERIA,CULT W/FLUOROCHROME SMEAR
MICRO NUMBER:: 91107564
SMEAR:: NONE SEEN
SPECIMEN QUALITY:: ADEQUATE

## 2017-12-07 LAB — FUNGUS CULTURE W SMEAR
MICRO NUMBER:: 91107563
SMEAR:: NONE SEEN
SPECIMEN QUALITY:: ADEQUATE

## 2017-12-11 ENCOUNTER — Ambulatory Visit (INDEPENDENT_AMBULATORY_CARE_PROVIDER_SITE_OTHER): Payer: BC Managed Care – PPO | Admitting: Family Medicine

## 2017-12-11 ENCOUNTER — Encounter: Payer: Self-pay | Admitting: Family Medicine

## 2017-12-11 VITALS — BP 120/82 | HR 86 | Temp 98.1°F | Ht 64.0 in | Wt 182.4 lb

## 2017-12-11 DIAGNOSIS — E041 Nontoxic single thyroid nodule: Secondary | ICD-10-CM

## 2017-12-11 DIAGNOSIS — E119 Type 2 diabetes mellitus without complications: Secondary | ICD-10-CM | POA: Diagnosis not present

## 2017-12-11 DIAGNOSIS — R7989 Other specified abnormal findings of blood chemistry: Secondary | ICD-10-CM

## 2017-12-11 LAB — T4, FREE: FREE T4: 0.87 ng/dL (ref 0.60–1.60)

## 2017-12-11 LAB — COMPREHENSIVE METABOLIC PANEL
ALBUMIN: 4.5 g/dL (ref 3.5–5.2)
ALT: 15 U/L (ref 0–35)
AST: 17 U/L (ref 0–37)
Alkaline Phosphatase: 57 U/L (ref 39–117)
BILIRUBIN TOTAL: 0.4 mg/dL (ref 0.2–1.2)
BUN: 21 mg/dL (ref 6–23)
CHLORIDE: 101 meq/L (ref 96–112)
CO2: 31 mEq/L (ref 19–32)
CREATININE: 0.99 mg/dL (ref 0.40–1.20)
Calcium: 10 mg/dL (ref 8.4–10.5)
GFR: 60.98 mL/min (ref >60.00)
Glucose, Bld: 81 mg/dL (ref 70–99)
Potassium: 3.9 mEq/L (ref 3.5–5.1)
Sodium: 142 mEq/L (ref 135–145)
Total Protein: 7.4 g/dL (ref 6.0–8.3)

## 2017-12-11 LAB — HEMOGLOBIN A1C: Hgb A1c MFr Bld: 6.8 % — ABNORMAL HIGH (ref 4.6–6.5)

## 2017-12-11 LAB — TSH: TSH: 1.46 u[IU]/mL (ref 0.35–4.50)

## 2017-12-11 MED ORDER — METFORMIN HCL 1000 MG PO TABS: 1000.0000 mg | ORAL_TABLET | Freq: Two times a day (BID) | ORAL | 3 refills | Status: DC

## 2017-12-11 NOTE — Progress Notes (Signed)
Subjective:   Patient ID: Karen Barnes, female    DOB: Jun 13, 1960, 57 y.o.   MRN: [id number]  Karen Barnes is a pleasant 57 y.o. year old female who presents to clinic today with Follow-up (pt f/u on DM , pt sts blood sugars have been lower, Blood sugars have ranged 115-120. Pt sts thyroid has been acting up , c/o hair loss, hoarsness , glands swollen , ear/jaw pain. Needs foot exam)  on 12/11/2017  HPI:  Here with her husband today.  Diabetes has been much better controlled.  DM- FSBS have been much lower 70s- 120 fasting. Has been having some symptoms of hypoglycemia when she drops into the 70s and 80s. Currently taking Metformin 1500 mg qam, 1000 mg qhs and Januiva 100 mg daily. Lab Results  Component Value Date   HGBA1C 7.7 (H) 10/03/2017   Hyperthyroidism with nodule- Biopsy results showed benign follicular nodule (bethesda II). She feels that her neck feels full, feels hoarse and her hair is thinning. Lab Results  Component Value Date   TSH 0.31 (L) 10/03/2017   Mr Breast Bilateral W Wo Contrast Inc Cad  Result Date: 11/23/2017 CLINICAL DATA:  57 year old patient presents for screening breast MRI. Family history of breast cancer in her mother and a maternal aunt. History of excisional biopsy of the right breast for complex sclerosing lesion. LABS:  Creatinine was obtained on site at [facility] at [address] Results: Creatinine 0.7 mg/dL. EXAM: BILATERAL BREAST MRI WITH AND WITHOUT CONTRAST TECHNIQUE: Multiplanar, multisequence MR images of both breasts were obtained prior to and following the intravenous administration of 16 ml of MultiHance. Three-dimensional MR images were rendered by post-processing of the original MR data on an independent workstation. The three-dimensional MR images were interpreted, and findings are reported in the following complete MRI report for this study. Three dimensional images were evaluated at the independent DynaCad  workstation COMPARISON:  Previous exam(s). FINDINGS: Breast composition: b. Scattered fibroglandular tissue. Background parenchymal enhancement: Mild Right breast: No mass or abnormal enhancement. Left breast: No mass or abnormal enhancement. Lymph nodes: No abnormal appearing lymph nodes. Ancillary findings:  None. IMPRESSION: No MRI evidence of malignancy in either breast. RECOMMENDATION: Bilateral screening mammogram is recommended in December 2019. Consider screening breast MRI in 1 year if the patient's calculated lifetime risk of developing breast cancer is greater than 20%. BI-RADS CATEGORY  1: Negative. Electronically Signed   By: Britta Mccreedy M.D.   On: 11/23/2017 16:40   [place] Fna Bx Thyroid 1st Lesion Afirma  Result Date: 12/04/2017 INDICATION: Indeterminate thyroid nodule EXAM: ULTRASOUND GUIDED FINE NEEDLE ASPIRATION OF INDETERMINATE THYROID NODULE COMPARISON:  Ultrasound done November 16, 2017 MEDICATIONS: 1% lidocaine 4 mL COMPLICATIONS: None immediate. TECHNIQUE: Informed written consent was obtained from the patient after a discussion of the risks, benefits and alternatives to treatment. Questions regarding the procedure were encouraged and answered. A timeout was performed prior to the initiation of the procedure. Pre-procedural ultrasound scanning demonstrated unchanged size and appearance of the indeterminate nodule within the right lobe of the thyroid. The procedure was planned. The neck was prepped in the usual sterile fashion, and a sterile drape was applied covering the operative field. A timeout was performed prior to the initiation of the procedure. Local anesthesia was provided with 1% lidocaine. Under direct ultrasound guidance, 5 FNA biopsies were performed of the right thyroid nodule with a 25 gauge needle. Multiple ultrasound images were saved for procedural documentation purposes. The samples were prepared  and submitted to pathology. Limited post procedural scanning was negative for  hematoma or additional complication. Dressings were placed. The patient tolerated the above procedures procedure well without immediate postprocedural complication. FINDINGS: FINDINGS Nodule reference number based on prior diagnostic ultrasound: 1 Maximum size: 1.5 cm Location: Right;  Mid ACR TI-RADS risk category:  TR5 Reason for biopsy: meets ACR TI-RADS criteria Ultrasound imaging confirms appropriate placement of the needles within the thyroid nodule. IMPRESSION: Technically successful ultrasound guided fine needle aspiration of the right thyroid nodule. Read by: Corrin Parker, PA-C Electronically Signed   By: Malachy Moan M.D.   On: 12/04/2017 16:51   US Thyroid  Result Date: 11/16/2017 CLINICAL DATA:  Incidental on CT. Right-sided thyroid nodule incidentally noted on chest CT performed 11/08/2027 EXAM: THYROID ULTRASOUND TECHNIQUE: Ultrasound examination of the thyroid gland and adjacent soft tissues was performed. COMPARISON:  Chest CT-11/07/2017 FINDINGS: Parenchymal Echotexture: Mildly heterogenous Isthmus: Normal in size measures 0.3 cm in diameter Right lobe: Normal in size measuring 5.0 x 2.0 x 1.5 cm Left lobe: Normal in size measuring 4.4 x 1.6 x 1.5 cm _________________________________________________________ Estimated total number of nodules >/= 1 cm: 1 Number of spongiform nodules >/=  2 cm not described below (TR1): 0 Number of mixed cystic and solid nodules >/= 1.5 cm not described below (TR2): 0 _________________________________________________________ Nodule # 1: Location: Right; Mid - this nodule correlates with the nodule seen on preceding chest CT. Maximum size: 1.5 cm; Other 2 dimensions: 1.0 x 0.9 cm Composition: solid/almost completely solid (2) Echogenicity: hypoechoic (2) Shape: not taller-than-wide (0) Margins: extra-thyroidal extension (3) Echogenic foci: punctate echogenic foci (3) ACR TI-RADS total points: 10. ACR TI-RADS risk category: TR5 (>/= 7 points). ACR TI-RADS  recommendations: **Given size (>/= 1.0 cm) and appearance, fine needle aspiration of this highly suspicious nodule should be considered based on TI-RADS criteria. _________________________________________________________ There is a punctate (sub 6 mm) hypoechoic nodule involving the anterior mid aspect the right lobe of the thyroid which does not meet imaging criteria to recommend percutaneous sampling or continued dedicated follow-up. IMPRESSION: Nodule #1 within the right lobe of the thyroid, correlating with the nodule seen on preceding chest CT, meets imaging criteria to recommend percutaneous sampling as clinically indicated. The above is in keeping with the ACR TI-RADS recommendations - J Am Coll Radiol 2017;14:587-595. Electronically Signed   By: Simonne Come M.D.   On: 11/16/2017 14:04   Current Outpatient Medications on File Prior to Visit  Medication Sig Dispense Refill  . albuterol (PROVENTIL HFA;VENTOLIN HFA) 108 (90 BASE) MCG/ACT inhaler Inhale 2 puffs into the lungs every 6 (six) hours as needed.    Marland Kitchen aspirin EC 81 MG tablet Take 81 mg by mouth daily.    . bifidobacterium infantis (ALIGN) capsule Take 1 capsule by mouth daily.    . budesonide-formoterol (SYMBICORT) 160-4.5 MCG/ACT inhaler Inhale 2 puffs into the lungs 2 (two) times daily.    . Calcium Carbonate-Vitamin D (CALCIUM 600+D) 600-200 MG-UNIT TABS Take 1 tablet by mouth daily.    . Continuous Blood Gluc Sensor (FREESTYLE LIBRE 14 DAY SENSOR) MISC USE AS DIRECTED PER MD 2 each 5  . Continuous Blood Gluc Sensor (FREESTYLE LIBRE SENSOR SYSTEM) MISC CHECK BLOOD SUGAR DAILY AS DIRECTED. CHANGE DEVICE EVERY 10 DAYS 3 each 12  . diltiazem (CARDIZEM) 30 MG tablet Take 1 tablet (30 mg total) by mouth every 6 (six) hours as needed (for palpitations). 30 tablet 4  . docusate sodium (COLACE) 100 MG capsule Take 100 mg  by mouth 2 (two) times daily.    Marland Kitchen EPINEPHrine (AUVI-Q) 0.3 mg/0.3 mL IJ SOAJ injection Inject 0.3 mg into the muscle as  directed. For immunotherapy (allergy shots) anaphylactic reactions    . fexofenadine (ALLEGRA) 180 MG tablet Take 180 mg by mouth every morning.     . fluticasone (FLONASE) 50 MCG/ACT nasal spray Place 2 sprays into both nostrils 2 (two) times daily.     . hydrocortisone 2.5 % cream APPLY EXTERNALLY TO THE AFFECTED AREA TWICE DAILY AS NEEDED (Patient taking differently: Apply 1 application topically 2 (two) times daily as needed (for skin irritation/rash). ) 30 g 0  . montelukast (SINGULAIR) 10 MG tablet Take 10 mg by mouth at bedtime.      . Multiple Vitamins-Minerals (MULTIVITAMIN WITH MINERALS) tablet Take 1 tablet by mouth daily.    . nitrofurantoin, macrocrystal-monohydrate, (MACROBID) 100 MG capsule TAKE AS DIRECTED POST COITAL (Patient taking differently: Take 100 mg by mouth daily as needed (post coital). ) 30 capsule 0  . olopatadine (PATANOL) 0.1 % ophthalmic solution Place 1 drop into both eyes 2 (two) times daily as needed for allergies.    Marland Kitchen omeprazole (PRILOSEC) 40 MG capsule Take 40 mg by mouth at bedtime.    . ondansetron (ZOFRAN) 4 MG tablet Take 1 tablet (4 mg total) by mouth every 8 (eight) hours as needed for nausea or vomiting. 30 tablet 0  . pantoprazole (PROTONIX) 40 MG tablet Take 1 tablet (40 mg total) by mouth daily. 30 tablet 3  . PRESCRIPTION MEDICATION Allergy Shots    . promethazine (PHENERGAN) 25 MG suppository Place 1 suppository (25 mg total) rectally every 6 (six) hours as needed for nausea or vomiting. 12 each 0  . rosuvastatin (CRESTOR) 5 MG tablet TAKE 1 TABLET(5 MG) BY MOUTH DAILY 90 tablet 3  . sodium chloride HYPERTONIC 3 % nebulizer solution Take by nebulization 2 (two) times daily. 300 mL 11  . SPIRIVA RESPIMAT 1.25 MCG/ACT AERS Inhale 2 puffs into the lungs daily.   5  . benzonatate (TESSALON) 100 MG capsule Take 1 capsule (100 mg total) by mouth 3 (three) times daily. (Patient not taking: Reported on 12/11/2017) 30 capsule 1  . furosemide (LASIX) 20 MG  tablet Take 1 tablet (20 mg total) by mouth daily as needed for fluid or edema. (Patient taking differently: Take 20 mg by mouth daily. ) 30 tablet 3   No current facility-administered medications on file prior to visit.     Allergies  Allergen Reactions  . Butorphanol Tartrate Other (See Comments)    REACTION: hallucinations  . Hydrocodone Other (See Comments)    HALLUCINATIONS  . Roxicet [Oxycodone-Acetaminophen] Other (See Comments)    HALLUCINATIONS  . Penicillins Hives    Occurred at age 68/CHILDHOOD ALLERGY Has patient had a PCN reaction causing immediate rash, facial/tongue/throat swelling, SOB or lightheadedness with hypotension: Unknown Has patient had a PCN reaction causing severe rash involving mucus membranes or skin necrosis: Unknown Has patient had a PCN reaction that required hospitalization: Unknown Has patient had a PCN reaction occurring within the last 10 years: No If all of the above answers are "NO", then may proceed with Cephalosporin use.     Past Medical History:  Diagnosis Date  . Asthma, moderate persistent    sees Dr Irena Cords  . Diabetes mellitus    A1C 6.2---->  2009  . Family history of adverse reaction to anesthesia    sister had cardiac arrest with rhinoplasty  . Fibrocystic  breast   . GERD (gastroesophageal reflux disease)   . HH (hiatus hernia) 09/2014   Dr. Ewing Schlein  . History of echocardiogram    Echo 5/19: EF 55-60, normal wall motion, PASP 41  . History of hiatal hernia   . History of nuclear stress test    Nuclear stress test 5/19:  EF 64, normal perfusion, Low risk  . Hot flashes   . Hyperlipidemia   . Normal nuclear stress test 2006  . Palpitations    PACs sees cards   . Recurrent cystitis    took  postcoital antibiotic for years, symptoms better after her hysterectomy 1996, symptoms re-surface in 2011  . RUQ abdominal pain 2016   EGD showed small HH, otherwise normal  . Wears glasses     Past Surgical History:  Procedure  Laterality Date  . ABDOMINAL HYSTERECTOMY  1996   no oophorectomy, d/tendometriosis and fibroids approximately 1996  . BREAST EXCISIONAL BIOPSY Right 03/12/2014   [facility]  . BREAST LUMPECTOMY WITH RADIOACTIVE SEED LOCALIZATION Right 03/12/2014   Procedure: BREAST LUMPECTOMY WITH RADIOACTIVE SEED LOCALIZATION;  Surgeon: Harriette Bouillon, MD;  Location: [facility];  Service: General;  Laterality: Right;  . BREAST SURGERY     Lumpectomy-- BENIGN   . CATARACT EXTRACTION Bilateral 2011  . CESAREAN SECTION    . COSMETIC SURGERY  01/2017   [facility] Touch w/ GYN  . DILATION AND CURETTAGE OF UTERUS    . LIPOMA EXCISION  1978  . RIGHT HEART CATH N/A 11/26/2017   Procedure: RIGHT HEART CATH;  Surgeon: Laurey Morale, MD;  Location: [facility] INVASIVE CV LAB;  Service: Cardiovascular;  Laterality: N/A;  . UPPER GASTROINTESTINAL ENDOSCOPY    . UPPER GI ENDOSCOPY  09/11/2014   Small Hiatus Hernia, Dr. Ewing Schlein    Family History  Problem Relation Age of Onset  . Mitral valve prolapse Mother   . Breast cancer Mother 65       M had ca insitu (age 67), aunt   . Dementia Mother   . Heart disease Father 59       early   . Diabetes Father   . Kidney failure Father   . Sleep apnea Father   . Brain cancer Other        2 fam members   . Lung cancer Other        uncle, heavy smoker  . Breast cancer Maternal Aunt 60  . CAD Paternal Grandmother 92  . CAD Paternal Grandfather 80  . Colon cancer Neg Hx     Social History   Socioeconomic History  . Marital status: Married    Spouse name: Not on file  . Number of children: 1  . Years of education: College  . Highest education level: Not on file  Occupational History  . Occupation: retired principal  Engineer, production  . Financial resource strain: Not on file  . Food insecurity:    Worry: Not on file    Inability: Not on file  . Transportation needs:    Medical: Not on file    Non-medical: Not on file  Tobacco Use  . Smoking status: Never  Smoker  . Smokeless tobacco: Never Used  Substance and Sexual Activity  . Alcohol use: No  . Drug use: No  . Sexual activity: Yes  Lifestyle  . Physical activity:    Days per week: Not on file    Minutes per session: Not on file  . Stress: Not on file  Relationships  . Social connections:    Talks on phone: Not on file    Gets together: Not on file    Attends religious service: Not on file    Active member of club or organization: Not on file    Attends meetings of clubs or organizations: Not on file    Relationship status: Not on file  . Intimate partner violence:    Fear of current or ex partner: Not on file    Emotionally abused: Not on file    Physically abused: Not on file    Forced sexual activity: Not on file  Other Topics Concern  . Not on file  Social History Narrative   Household-- pt and husband    Rare caffeine use   Daughter independent, 1 G-child   Engineer, structural for F and M, both w/ dementia   The PMH, PSH, Social History, Family History, Medications, and allergies have been reviewed in [facility], and have been updated if relevant.   Review of Systems  Constitutional: Positive for fatigue.  HENT: Positive for trouble swallowing and voice change.   Eyes: Negative.   Cardiovascular: Negative.   Gastrointestinal: Negative.   Endocrine: Negative.   Musculoskeletal: Positive for neck pain.  Allergic/Immunologic: Negative.   Neurological: Negative.   Hematological: Negative.   All other systems reviewed and are negative.      Objective:    BP 120/82 (BP Location: Right Arm, Patient Position: Sitting, Cuff Size: Normal)   Pulse 86   Temp 98.1 F (36.7 C) (Oral)   Ht 5\' 4"  (1.626 m)   Wt 182 lb 6.4 oz (82.7 kg)   SpO2 98%   BMI 31.31 kg/m    Physical Exam        Assessment & Plan:   No diagnosis found. No follow-ups on file.

## 2017-12-11 NOTE — Assessment & Plan Note (Signed)
>  40 minutes spent in face to face time with patient and her husband, >50% spent in counselling or coordination of care. I gave them a copy of her pathology results and also discussed what these results mean.  It is a benign nodule which is reassuring and could be monitored yearly but since she is hyperthyroid and symptomatic we agreed that repeating labs today is warranted, along with an endocrinology referral for further management.

## 2017-12-11 NOTE — Assessment & Plan Note (Signed)
Improving off prednisone as anticipated -a1c down to 6.8 from 7.7 since 09/2017.  Now with almost daily episodes of hypoglycemia. Will decrease Metformin to 1000 mg twice daily and d/c Januvia. Referring to end for hyperthyroidism and thyroid nodule, also advised her to follow up with me in 3 month--sooner if she has any concerns. The patient indicates understanding of these issues and agrees with the plan. Lab Results  Component Value Date   HGBA1C 6.8 (H) 12/11/2017

## 2017-12-11 NOTE — Patient Instructions (Addendum)
Great to see you.  Please STOP taking Januvia. Decrease Metformin 1000 twice daily.  Great to see you. I will call you with your lab results from today and you can view them online.   You will be contact soon about your endocrinology appointment.

## 2017-12-12 LAB — T3: T3, Total: 139 ng/dL (ref 76–181)

## 2017-12-12 LAB — T3 UPTAKE: T3 Uptake: 28 % (ref 22–35)

## 2017-12-13 ENCOUNTER — Other Ambulatory Visit: Payer: Self-pay | Admitting: Cardiology

## 2017-12-21 ENCOUNTER — Ambulatory Visit (INDEPENDENT_AMBULATORY_CARE_PROVIDER_SITE_OTHER): Payer: BC Managed Care – PPO | Admitting: Pulmonary Disease

## 2017-12-21 ENCOUNTER — Ambulatory Visit: Payer: BC Managed Care – PPO | Admitting: Pulmonary Disease

## 2017-12-21 ENCOUNTER — Encounter: Payer: Self-pay | Admitting: Pulmonary Disease

## 2017-12-21 VITALS — BP 116/84 | HR 80 | Ht 64.0 in | Wt 181.6 lb

## 2017-12-21 DIAGNOSIS — J45901 Unspecified asthma with (acute) exacerbation: Secondary | ICD-10-CM

## 2017-12-21 DIAGNOSIS — J479 Bronchiectasis, uncomplicated: Secondary | ICD-10-CM

## 2017-12-21 DIAGNOSIS — I272 Pulmonary hypertension, unspecified: Secondary | ICD-10-CM

## 2017-12-21 NOTE — Progress Notes (Signed)
Synopsis: first seen in 2017 for asthma and a pulmonary nodule, life long non-smoker; She has severe GERD and is followed by Dr. Ewing Schlein.  She started on immunotherapy in 2017 She was born prematurely at 55 weeks.    Subjective:    Patient ID: Karen Barnes, female    DOB: 1960-11-23, 57 y.o.   MRN: [id number]  HPI Chief Complaint  Patient presents with  . Follow-up    States her breathing has been good, she uses her flutter valve and nebs and has noticed a significant difference. She needs paper refills for any refills that we send.    Ms. Ocanas says that she has been fine since the last visit.  She has not had an exacerbation of bronchitis or pneumonia.  She continues to use her hypertonic saline nebulizer twice a day and has had good benefit from this.  She says that she produces a little bit of mucus during the week but for the most part this has decreased.  She is compliant with hypertonic saline, she uses it with a travel nebulizer device so this minimizes the time that she spends on it.  She also uses her flutter valve routinely.  She had a right heart catheterization which showed evidence of fluid retention but no evidence of primary pulmonary hypertension.   Past Medical History:  Diagnosis Date  . Asthma, moderate persistent    sees Dr Irena Cords  . Diabetes mellitus    A1C 6.2---->  2009  . Family history of adverse reaction to anesthesia    sister had cardiac arrest with rhinoplasty  . Fibrocystic breast   . GERD (gastroesophageal reflux disease)   . HH (hiatus hernia) 09/2014   Dr. Ewing Schlein  . History of echocardiogram    Echo 5/19: EF 55-60, normal wall motion, PASP 41  . History of hiatal hernia   . History of nuclear stress test    Nuclear stress test 5/19:  EF 64, normal perfusion, Low risk  . Hot flashes   . Hyperlipidemia   . Normal nuclear stress test 2006  . Palpitations    PACs sees cards   . Recurrent cystitis    took  postcoital antibiotic for years,  symptoms better after her hysterectomy 1996, symptoms re-surface in 2011  . RUQ abdominal pain 2016   EGD showed small HH, otherwise normal  . Wears glasses         Review of Systems  Constitutional: Positive for unexpected weight change. Negative for fever.  HENT: Positive for congestion, postnasal drip and sinus pressure. Negative for dental problem, ear pain, nosebleeds, rhinorrhea, sneezing, sore throat and trouble swallowing.   Eyes: Negative for redness and itching.  Respiratory: Positive for cough. Negative for chest tightness, shortness of breath and wheezing.   Cardiovascular: Negative for palpitations and leg swelling.  Gastrointestinal: Positive for nausea. Negative for vomiting.  Genitourinary: Negative for dysuria.  Musculoskeletal: Negative for joint swelling.  Skin: Negative for rash.  Neurological: Negative for headaches.  Hematological: Does not bruise/bleed easily.  Psychiatric/Behavioral: Negative for dysphoric mood. The patient is not nervous/anxious.        Objective:   Physical Exam Vitals:   12/21/17 1039  BP: 116/84  Pulse: 80  SpO2: 96%  Weight: 181 lb 9.6 oz (82.4 kg)  Height: 5\' 4"  (1.626 m)   RA  Gen: well appearing HENT: OP clear, TM's clear, neck supple PULM: CTA B, normal percussion CV: RRR, no mgr, trace edema GI: BS+, soft,  nontender Derm: no cyanosis or rash Psyche: normal mood and affect    FENO 10/2017 on symbicort while sick: 22 ppm  Chest imaging November 07, 2017 high-resolution CT scan of the chest images independently reviewed: Mild cylindrical bronchiectasis particularly in the left lower lobe, some in the right lower lobe 10/18/2015 chest CT images personal reviewed showing normal pulmonary parenchyma with the exception of a 3.4 mm nodule in the left lower lobe.  Pulmonary function test: October 2019 ratio 90%, FVC 2.56 L 76% predicted, total lung capacity 4.32 L 84% predicted, DLCO 20.7 mL 84% predicted  Labs: October  2019 complement levels within normal limits, IgG subclasses all normal, alpha-1 antitrypsin level normal, CBC showed 300 absolute eosinophil count  Heart catheterization: October 2019 right heart catheterization showed mean PA pressure 24, pulmonary capillary wedge pressure 19, PVR 0.7 [place] units        Assessment & Plan:   Bronchiectasis without complication (HCC)  Pulmonary hypertension, low resistance (HCC)  Asthmatic bronchitis with acute exacerbation, unspecified asthma severity, unspecified whether persistent  Discussion: This has been a stable interval for [place].  She was diagnosed with diastolic dysfunction on the right heart catheterization.  She has not had an exacerbation of bronchiectasis or asthma since the last visit.  Plan: Allergic asthma: Continue Symbicort Continue Spiriva Continue immunotherapy as directed by [place] [facility] allergy with Dr. Elta GuadeloupeVon Winkle  Bronchiectasis: Keep using the flutter valve twice a day Keep using hypertonic saline nebulized twice a day If you are sick with chest congestion mucus production or shortness of breath please see us right away  Diastolic dysfunction: Continue salt restriction and behavioral management as directed by Dr. Sherlie BanMcClain  We will see you back in 4 to 6 months or sooner if needed  > 50% of this 15 minute visit spent face to face        Current Outpatient Medications:  .  albuterol (PROVENTIL HFA;VENTOLIN HFA) 108 (90 BASE) MCG/ACT inhaler, Inhale 2 puffs into the lungs every 6 (six) hours as needed., Disp: , Rfl:  .  aspirin EC 81 MG tablet, Take 81 mg by mouth daily., Disp: , Rfl:  .  bifidobacterium infantis (ALIGN) capsule, Take 1 capsule by mouth daily., Disp: , Rfl:  .  budesonide-formoterol (SYMBICORT) 160-4.5 MCG/ACT inhaler, Inhale 2 puffs into the lungs 2 (two) times daily., Disp: , Rfl:  .  Calcium Carbonate-Vitamin D (CALCIUM 600+D) 600-200 MG-UNIT TABS, Take 1 tablet by mouth daily., Disp: ,  Rfl:  .  Continuous Blood Gluc Sensor (FREESTYLE LIBRE 14 DAY SENSOR) MISC, USE AS DIRECTED PER MD, Disp: 2 each, Rfl: 5 .  Continuous Blood Gluc Sensor (FREESTYLE LIBRE SENSOR SYSTEM) MISC, CHECK BLOOD SUGAR DAILY AS DIRECTED. CHANGE DEVICE EVERY 10 DAYS, Disp: 3 each, Rfl: 12 .  diltiazem (CARDIZEM) 30 MG tablet, Take 1 tablet (30 mg total) by mouth every 6 (six) hours as needed (for palpitations)., Disp: 30 tablet, Rfl: 4 .  docusate sodium (COLACE) 100 MG capsule, Take 100 mg by mouth 2 (two) times daily., Disp: , Rfl:  .  EPINEPHrine (AUVI-Q) 0.3 mg/0.3 mL IJ SOAJ injection, Inject 0.3 mg into the muscle as directed. For immunotherapy (allergy shots) anaphylactic reactions, Disp: , Rfl:  .  fexofenadine (ALLEGRA) 180 MG tablet, Take 180 mg by mouth every morning. , Disp: , Rfl:  .  fluticasone (FLONASE) 50 MCG/ACT nasal spray, Place 2 sprays into both nostrils 2 (two) times daily. , Disp: , Rfl:  .  furosemide (LASIX)  20 MG tablet, TAKE 1 TABLET(20 MG) BY MOUTH DAILY AS NEEDED FOR FLUID RETENTION OR SWELLING, Disp: 30 tablet, Rfl: 8 .  hydrocortisone 2.5 % cream, APPLY EXTERNALLY TO THE AFFECTED AREA TWICE DAILY AS NEEDED (Patient taking differently: Apply 1 application topically 2 (two) times daily as needed (for skin irritation/rash). ), Disp: 30 g, Rfl: 0 .  metFORMIN (GLUCOPHAGE) 1000 MG tablet, Take 1 tablet (1,000 mg total) by mouth 2 (two) times daily with a meal., Disp: 180 tablet, Rfl: 3 .  montelukast (SINGULAIR) 10 MG tablet, Take 10 mg by mouth at bedtime.  , Disp: , Rfl:  .  Multiple Vitamins-Minerals (MULTIVITAMIN WITH MINERALS) tablet, Take 1 tablet by mouth daily., Disp: , Rfl:  .  nitrofurantoin, macrocrystal-monohydrate, (MACROBID) 100 MG capsule, TAKE AS DIRECTED POST COITAL (Patient taking differently: Take 100 mg by mouth daily as needed (post coital). ), Disp: 30 capsule, Rfl: 0 .  olopatadine (PATANOL) 0.1 % ophthalmic solution, Place 1 drop into both eyes 2 (two) times  daily as needed for allergies., Disp: , Rfl:  .  omeprazole (PRILOSEC) 40 MG capsule, Take 40 mg by mouth at bedtime., Disp: , Rfl:  .  ondansetron (ZOFRAN) 4 MG tablet, Take 1 tablet (4 mg total) by mouth every 8 (eight) hours as needed for nausea or vomiting., Disp: 30 tablet, Rfl: 0 .  pantoprazole (PROTONIX) 40 MG tablet, Take 1 tablet (40 mg total) by mouth daily., Disp: 30 tablet, Rfl: 3 .  PRESCRIPTION MEDICATION, Allergy Shots, Disp: , Rfl:  .  promethazine (PHENERGAN) 25 MG suppository, Place 1 suppository (25 mg total) rectally every 6 (six) hours as needed for nausea or vomiting., Disp: 12 each, Rfl: 0 .  rosuvastatin (CRESTOR) 5 MG tablet, TAKE 1 TABLET(5 MG) BY MOUTH DAILY, Disp: 90 tablet, Rfl: 3 .  sodium chloride HYPERTONIC 3 % nebulizer solution, Take by nebulization 2 (two) times daily., Disp: 300 mL, Rfl: 11 .  SPIRIVA RESPIMAT 1.25 MCG/ACT AERS, Inhale 2 puffs into the lungs daily. , Disp: , Rfl: 5 .  benzonatate (TESSALON) 100 MG capsule, Take 1 capsule (100 mg total) by mouth 3 (three) times daily. (Patient not taking: Reported on 12/11/2017), Disp: 30 capsule, Rfl: 1

## 2017-12-21 NOTE — Patient Instructions (Signed)
Allergic asthma: Continue Symbicort Continue Spiriva Continue immunotherapy as directed by [place] [facility] allergy with Dr. Elta GuadeloupeVon Winkle  Bronchiectasis: Keep using the flutter valve twice a day Keep using hypertonic saline nebulized twice a day If you are sick with chest congestion mucus production or shortness of breath please see us right away  Diastolic dysfunction: Continue salt restriction and behavioral management as directed by Dr. Sherlie BanMcClain  We will see you back in 4 to 6 months or sooner if needed

## 2017-12-28 ENCOUNTER — Ambulatory Visit (INDEPENDENT_AMBULATORY_CARE_PROVIDER_SITE_OTHER): Payer: BC Managed Care – PPO | Admitting: Internal Medicine

## 2017-12-28 ENCOUNTER — Encounter: Payer: Self-pay | Admitting: Internal Medicine

## 2017-12-28 VITALS — BP 124/78 | HR 95 | Ht 64.0 in | Wt 182.6 lb

## 2017-12-28 DIAGNOSIS — E119 Type 2 diabetes mellitus without complications: Secondary | ICD-10-CM

## 2017-12-28 DIAGNOSIS — E041 Nontoxic single thyroid nodule: Secondary | ICD-10-CM

## 2017-12-28 NOTE — Patient Instructions (Addendum)
-   We will schedule you for a repeat thyroid ultrasound in 5 months

## 2017-12-28 NOTE — Progress Notes (Signed)
Name: Karen Barnes  MRN/ DOB: [id number], May 18, 1960    Age/ Sex: 57 y.o., female    PCP: Dianne Dun, MD   Reason for Endocrinology Evaluation: Thyroid nodule      Date of Initial Endocrinology Evaluation: 12/28/2017     HPI: Karen Barnes is a 57 y.o. female with a past medical history of T2DM, Asthma, GERD,Thyroid nodule. The patient presented for initial endocrinology clinic visit on 12/28/2017 for consultative assistance with her thyroid nodule.   Patient was found to have an incidental thyroid nodule during a chest CT that was ordered for cough . A thyroid ultrasound showed a right thyroid nodule   S/P FNA with benign cytology in October, 2019   She has had intermittent low TSH since 2016 (per patient)   Diabetes:  Pt with T2DM, she was on Januvia and Metformin up until recently. She did have one episode of hypoglycemia with a BG of 68 mg/dL. Pt tends to go on and off prednisone due to asthma. She has fear of hypoglycemia, she tends to eat when BG is 114 mg/dL due to fear of hypoglycemia.    HISTORY:  Past Medical History:  Past Medical History:  Diagnosis Date  . Asthma, moderate persistent    sees Dr Irena Cords  . Diabetes mellitus    A1C 6.2---->  2009  . Family history of adverse reaction to anesthesia    sister had cardiac arrest with rhinoplasty  . Fibrocystic breast   . GERD (gastroesophageal reflux disease)   . HH (hiatus hernia) 09/2014   Dr. Ewing Schlein  . History of echocardiogram    Echo 5/19: EF 55-60, normal wall motion, PASP 41  . History of hiatal hernia   . History of nuclear stress test    Nuclear stress test 5/19:  EF 64, normal perfusion, Low risk  . Hot flashes   . Hyperlipidemia   . Normal nuclear stress test 2006  . Palpitations    PACs sees cards   . Recurrent cystitis    took  postcoital antibiotic for years, symptoms better after her hysterectomy 1996, symptoms re-surface in 2011  . RUQ abdominal pain 2016   EGD showed  small HH, otherwise normal  . Wears glasses    Past Surgical History:  Past Surgical History:  Procedure Laterality Date  . ABDOMINAL HYSTERECTOMY  1996   no oophorectomy, d/tendometriosis and fibroids approximately 1996  . BREAST EXCISIONAL BIOPSY Right 03/12/2014   [facility]  . BREAST LUMPECTOMY WITH RADIOACTIVE SEED LOCALIZATION Right 03/12/2014   Procedure: BREAST LUMPECTOMY WITH RADIOACTIVE SEED LOCALIZATION;  Surgeon: Harriette Bouillon, MD;  Location: [facility];  Service: General;  Laterality: Right;  . BREAST SURGERY     Lumpectomy-- BENIGN   . CATARACT EXTRACTION Bilateral 2011  . CESAREAN SECTION    . COSMETIC SURGERY  01/2017   [facility] Touch w/ GYN  . DILATION AND CURETTAGE OF UTERUS    . LIPOMA EXCISION  1978  . RIGHT HEART CATH N/A 11/26/2017   Procedure: RIGHT HEART CATH;  Surgeon: Laurey Morale, MD;  Location: [facility] INVASIVE CV LAB;  Service: Cardiovascular;  Laterality: N/A;  . UPPER GASTROINTESTINAL ENDOSCOPY    . UPPER GI ENDOSCOPY  09/11/2014   Small Hiatus Hernia, Dr. Ewing Schlein      Social History:  reports that she has never smoked. She has never used smokeless tobacco. She reports that she does not drink alcohol or use drugs.  Family History: family  history includes Brain cancer in her other; Breast cancer (age of onset: 48) in her maternal aunt; Breast cancer (age of onset: 20) in her mother; CAD (age of onset: 65) in her paternal grandfather and paternal grandmother; Dementia in her mother; Diabetes in her father; Heart disease (age of onset: 68) in her father; Kidney failure in her father; Lung cancer in her other; Mitral valve prolapse in her mother; Sleep apnea in her father.   HOME MEDICATIONS: Current Outpatient Medications on File Prior to Visit  Medication Sig Dispense Refill  . albuterol (PROVENTIL HFA;VENTOLIN HFA) 108 (90 BASE) MCG/ACT inhaler Inhale 2 puffs into the lungs every 6 (six) hours as needed.    Marland Kitchen aspirin EC 81 MG tablet Take 81 mg by  mouth daily.    . bifidobacterium infantis (ALIGN) capsule Take 1 capsule by mouth daily.    . budesonide-formoterol (SYMBICORT) 160-4.5 MCG/ACT inhaler Inhale 2 puffs into the lungs 2 (two) times daily.    . Calcium Carbonate-Vitamin D (CALCIUM 600+D) 600-200 MG-UNIT TABS Take 1 tablet by mouth daily.    . Continuous Blood Gluc Sensor (FREESTYLE LIBRE 14 DAY SENSOR) MISC USE AS DIRECTED PER MD 2 each 5  . Continuous Blood Gluc Sensor (FREESTYLE LIBRE SENSOR SYSTEM) MISC CHECK BLOOD SUGAR DAILY AS DIRECTED. CHANGE DEVICE EVERY 10 DAYS 3 each 12  . docusate sodium (COLACE) 100 MG capsule Take 100 mg by mouth 2 (two) times daily.    Marland Kitchen EPINEPHrine (AUVI-Q) 0.3 mg/0.3 mL IJ SOAJ injection Inject 0.3 mg into the muscle as directed. For immunotherapy (allergy shots) anaphylactic reactions    . fexofenadine (ALLEGRA) 180 MG tablet Take 180 mg by mouth every morning.     . fluticasone (FLONASE) 50 MCG/ACT nasal spray Place 2 sprays into both nostrils 2 (two) times daily.     . furosemide (LASIX) 20 MG tablet TAKE 1 TABLET(20 MG) BY MOUTH DAILY AS NEEDED FOR FLUID RETENTION OR SWELLING 30 tablet 8  . hydrocortisone 2.5 % cream APPLY EXTERNALLY TO THE AFFECTED AREA TWICE DAILY AS NEEDED (Patient taking differently: Apply 1 application topically 2 (two) times daily as needed (for skin irritation/rash). ) 30 g 0  . metFORMIN (GLUCOPHAGE) 1000 MG tablet Take 1 tablet (1,000 mg total) by mouth 2 (two) times daily with a meal. 180 tablet 3  . montelukast (SINGULAIR) 10 MG tablet Take 10 mg by mouth at bedtime.      . Multiple Vitamins-Minerals (MULTIVITAMIN WITH MINERALS) tablet Take 1 tablet by mouth daily.    . nitrofurantoin, macrocrystal-monohydrate, (MACROBID) 100 MG capsule TAKE AS DIRECTED POST COITAL (Patient taking differently: Take 100 mg by mouth daily as needed (post coital). ) 30 capsule 0  . omeprazole (PRILOSEC) 40 MG capsule Take 40 mg by mouth at bedtime.    . ondansetron (ZOFRAN) 4 MG tablet Take  1 tablet (4 mg total) by mouth every 8 (eight) hours as needed for nausea or vomiting. 30 tablet 0  . pantoprazole (PROTONIX) 40 MG tablet Take 1 tablet (40 mg total) by mouth daily. 30 tablet 3  . PRESCRIPTION MEDICATION Allergy Shots    . rosuvastatin (CRESTOR) 5 MG tablet TAKE 1 TABLET(5 MG) BY MOUTH DAILY 90 tablet 3  . sodium chloride HYPERTONIC 3 % nebulizer solution Take by nebulization 2 (two) times daily. 300 mL 11  . SPIRIVA RESPIMAT 1.25 MCG/ACT AERS Inhale 2 puffs into the lungs daily.   5  . diltiazem (CARDIZEM) 30 MG tablet Take 1 tablet (30 mg total)  by mouth every 6 (six) hours as needed (for palpitations). (Patient not taking: Reported on 12/28/2017) 30 tablet 4   No current facility-administered medications on file prior to visit.       REVIEW OF SYSTEMS: A comprehensive ROS was conducted with the patient and is negative except as per HPI and below:  Review of Systems  Constitutional: Positive for malaise/fatigue. Negative for weight loss.  HENT: Positive for ear pain and sore throat. Negative for congestion.   Eyes: Negative for blurred vision and pain.       Vitreous detachment   Respiratory: Positive for cough.   Cardiovascular: Negative for chest pain and palpitations.  Gastrointestinal: Positive for constipation and diarrhea.  Genitourinary: Negative for frequency.  Skin: Negative.   Neurological: Negative for tingling and tremors.  Endo/Heme/Allergies: Negative for polydipsia.  Psychiatric/Behavioral: The patient has insomnia.        OBJECTIVE:  VS: BP 124/78 (BP Location: Right Arm, Patient Position: Sitting, Cuff Size: Normal)   Pulse 95   Ht 5\' 4"  (1.626 m)   Wt 182 lb 9.6 oz (82.8 kg)   SpO2 97%   BMI 31.34 kg/m    Wt Readings from Last 3 Encounters:  12/28/17 182 lb 9.6 oz (82.8 kg)  12/21/17 181 lb 9.6 oz (82.4 kg)  12/11/17 182 lb 6.4 oz (82.7 kg)     EXAM: General: Pt appears well and is in NAD  Hydration: Well-hydrated with moist  mucous membranes and good skin turgor  Eyes: External eye exam normal without stare, lid lag or exophthalmos.  EOM intact.  PERRL.  Ears, Nose, Throat: Hearing: Grossly intact bilaterally Dental: Good dentition  Throat: Clear without mass, erythema or exudate  Neck: General: Supple without adenopathy. Thyroid: Thyroid size normal.  No goiter or nodules appreciated. No thyroid bruit.  Lungs: Clear with good BS bilat with no rales, rhonchi, or wheezes  Heart: Auscultation: RRR.  Abdomen: Normoactive bowel sounds, soft, nontender, without masses or organomegaly palpable  Extremities: Gait and station: Normal gait  Digits and nails: No clubbing, cyanosis, petechiae, or nodes Head and neck: Normal alignment and mobility BL UE: Normal ROM and strength. BL LE: No pretibial edema normal ROM and strength.  Skin: Hair: Texture and amount normal with gender appropriate distribution Skin Inspection: No rashes, acanthosis nigricans/skin tags.  Skin Palpation: Skin temperature, texture, and thickness normal to palpation  Neuro: Cranial nerves: II - XII grossly intact Motor: Normal strength throughout DTRs: 2+ and symmetric in UE without delay in relaxation phase  Mental Status: Judgment, insight: Intact Orientation: Oriented to time, place, and person Mood and affect: No depression, anxiety, or agitation     DATA REVIEWED:  Results for Julieanne CottonUCKER, Wajiha B (MRN [id number]) as of 12/28/2017 14:51  Ref. Range 12/11/2017 09:30  TSH Latest Ref Range: 0.35 - 4.50 uIU/mL 1.46  Triiodothyronine (T3) Latest Ref Range: 76 - 181 ng/dL 782139  N5,AOZH([id number],Free(Direct) Latest Ref Range: 0.60 - 1.60 ng/dL 4.690.87  T3 Uptake Latest Ref Range: 22 - 35 % 28   Thyroid Ultrasound (11/16/2017)     Parenchymal Echotexture: Mildly heterogenous  Isthmus: Normal in size measures 0.3 cm in diameter  Right lobe: Normal in size measuring 5.0 x 2.0 x 1.5 cm  Left lobe: Normal in size measuring 4.4 x 1.6 x 1.5  cm  _________________________________________________________  Estimated total number of nodules >/= 1 cm: 1  Number of spongiform nodules >/=  2 cm not described below (TR1): 0  Number of mixed cystic and  solid nodules >/= 1.5 cm not described below (TR2): 0  _________________________________________________________  Nodule # 1:  Location: Right; Mid - this nodule correlates with the nodule seen on preceding chest CT.  Maximum size: 1.5 cm; Other 2 dimensions: 1.0 x 0.9 cm  Composition: solid/almost completely solid (2)  Echogenicity: hypoechoic (2)  Shape: not taller-than-wide (0)  Margins: extra-thyroidal extension (3)  Echogenic foci: punctate echogenic foci (3)  ACR TI-RADS total points: 10.  ACR TI-RADS risk category: TR5 (>/= 7 points).  ACR TI-RADS recommendations:  **Given size (>/= 1.0 cm) and appearance, fine needle aspiration of this highly suspicious nodule should be considered based on TI-RADS criteria.  _________________________________________________________  There is a punctate (sub 6 mm) hypoechoic nodule involving the anterior mid aspect the right lobe of the thyroid which does not meet imaging criteria to recommend percutaneous sampling or continued dedicated follow-up.  IMPRESSION: Nodule #1 within the right lobe of the thyroid, correlating with the nodule seen on preceding chest CT, meets imaging criteria to recommend percutaneous sampling as clinically indicated.   Old records , labs and images have been reviewed.    ASSESSMENT/PLAN/RECOMMENDATIONS:   1. Right thyroid Nodule:  - Patient denies any local symptoms - She is biochemically euthyroid.  - S/P FNA of the right thyroid nodule in October,2019 with benign cytology.  - Reassurance provided at this time.  - Will set her up for a repeat ultrasound in 6 months to determine stability.          Type 2 DM, Optimally Controlled: A1c 6.8%  - Pt with  fear of hypoglycemia, she perceives hypoglycemia with BG in low 100's mg/dL.  - Pt educated that hypoglycemia is a BG of <70 mg/dL and if she continues to eat with a BG of > 70 mg/dL, she will never have well controlled glucose.  - We discussed that Metformin does not cause hypoglycemia per se, she was advised to take it with meals.  - We reviewed rule of 15 for hypoglycemia.  - We also discussed effect of glucocorticoid on post-prandial hyperglycemia. She was advised its better if she takes glucocorticoids with her first meal of the day and to limit carbohydrates as much as she can while on them. - We discussed avoiding sugar-sweetened beverages and avoid snacks. - At this point her BG's are well controlled with Metformin , will defer further diabetes management to PCP.     F/U in 6 months.    Signed electronically by: Lyndle Herrlich, MD  [facility] Endocrinology  [facility] [address] [place] [place], [place] [address] Phone: [phone] FAX: [phone]   CC: Dianne Dun, MD [address] [place] [address] Phone: [phone] Fax: [phone]   Return to Endocrinology clinic as below: Future Appointments  Date Time Provider Department Center  02/18/2018  8:00 AM Lars Masson, MD [id number]

## 2018-01-02 ENCOUNTER — Other Ambulatory Visit: Payer: Self-pay | Admitting: Family Medicine

## 2018-01-02 DIAGNOSIS — K219 Gastro-esophageal reflux disease without esophagitis: Secondary | ICD-10-CM

## 2018-01-02 LAB — MYCOBACTERIA,CULT W/FLUOROCHROME SMEAR
MICRO NUMBER: 91232401
SMEAR: NONE SEEN
SPECIMEN QUALITY:: ADEQUATE

## 2018-01-02 LAB — RESPIRATORY CULTURE OR RESPIRATORY AND SPUTUM CULTURE
MICRO NUMBER: 91232402
RESULT:: NORMAL
SPECIMEN QUALITY:: ADEQUATE

## 2018-01-02 LAB — FUNGUS CULTURE W SMEAR
MICRO NUMBER: 91232400
SMEAR:: NONE SEEN
SPECIMEN QUALITY: ADEQUATE

## 2018-01-15 NOTE — Progress Notes (Signed)
Subjective:   Patient ID: Karen Barnes, female    DOB: 1960-06-26, 57 y.o.   MRN: [id number]  Karen Barnes is a pleasant 57 y.o. year old female who presents to clinic today with Follow-up (Patient is here to F/U after a referral to Endocrinology.  She saw Dr. Kelton Pillar on 11.22.19 for Thyroid Nodule and DM.  She is having her get a repeat U/S in 6 months to determine stability.  She also advised that DM is stable and deferred further Tx of DM to PCP unless things change. She is next scheduled to see her in April.  Patient is also schedule to see Dr. Meda Coffee her Cardiologist on 1.13.2020.  Labs completed in November were WNL.)  on 01/16/2018  HPI:  Here for follow up.  Saw Dr. Kelton Pillar (endocrinology) on 12/28/17. Note reviewed.  Thyroid nodule- found incidentally on chest CT, confirmed on thyroid US.  FNA showed benign cytology in 11/2017.  Lab Results  Component Value Date   TSH 1.46 12/11/2017   Endocrinology plan is to repeat thyroid US in 6 months   DM- blood sugars continue to improve now that she is off prednisone, as anticipated. a1c last month came down to 6.8 from 7.7 in 09/2017 and she was having almost daily symptoms of hypoglycemia when I last saw her on 12/11/17.  We therefore d/c'd her januvia and decreased Metformin dose to 1000 mg twice daily.   Checks FSBS up to four times daily. Fasting FSBS ranging between 90-117. Has had less episodes of glucose of hyoglycemia but has still had some.  Wt Readings from Last 3 Encounters:  01/16/18 180 lb (81.6 kg)  12/28/17 182 lb 9.6 oz (82.8 kg)  12/21/17 181 lb 9.6 oz (82.4 kg)    Lab Results  Component Value Date   HGBA1C 6.8 (H) 12/11/2017   HLD- compliant with Crestor 5 mg daily. Has appointment to see her cardiology, Dr. Meda Coffee next month.  Lab Results  Component Value Date   CHOL 166 10/03/2017   HDL 75.30 10/03/2017   LDLCALC 75 10/03/2017   TRIG 78.0 10/03/2017   CHOLHDL 2 10/03/2017   Hepatic  Function Latest Ref Rng & Units 12/11/2017 10/03/2017 09/23/2017  Total Protein 6.0 - 8.3 g/dL 7.4 7.6 7.0  Albumin 3.5 - 5.2 g/dL 4.5 4.3 3.7  AST 0 - 37 U/L _0 ALT 0 - 35 U/L _1 Alk Phosphatase 39 - 117 U/L 57 54 51  Total Bilirubin 0.2 - 1.2 mg/dL 0.4 0.5 0.5  Bilirubin, Direct 0.1 - 0.5 mg/dL - - -      Current Outpatient Medications on File Prior to Visit  Medication Sig Dispense Refill  . albuterol (PROVENTIL HFA;VENTOLIN HFA) 108 (90 BASE) MCG/ACT inhaler Inhale 2 puffs into the lungs every 6 (six) hours as needed.    Marland Kitchen aspirin EC 81 MG tablet Take 81 mg by mouth daily.    . bifidobacterium infantis (ALIGN) capsule Take 1 capsule by mouth daily.    . budesonide-formoterol (SYMBICORT) 160-4.5 MCG/ACT inhaler Inhale 2 puffs into the lungs 2 (two) times daily.    . Calcium Carbonate-Vitamin D (CALCIUM 600+D) 600-200 MG-UNIT TABS Take 1 tablet by mouth daily.    . Continuous Blood Gluc Sensor (FREESTYLE LIBRE 14 DAY SENSOR) MISC USE AS DIRECTED PER MD 2 each 5  . Continuous Blood Gluc Sensor ([place]) MISC CHECK BLOOD SUGAR DAILY AS DIRECTED. CHANGE DEVICE EVERY 10 DAYS 3 each  12  . diltiazem (CARDIZEM) 30 MG tablet Take 1 tablet (30 mg total) by mouth every 6 (six) hours as needed (for palpitations). 30 tablet 4  . docusate sodium (COLACE) 100 MG capsule Take 100 mg by mouth 2 (two) times daily.    Marland Kitchen EPINEPHrine (AUVI-Q) 0.3 mg/0.3 mL IJ SOAJ injection Inject 0.3 mg into the muscle as directed. For immunotherapy (allergy shots) anaphylactic reactions    . fexofenadine (ALLEGRA) 180 MG tablet Take 180 mg by mouth every morning.     . fluticasone (FLONASE) 50 MCG/ACT nasal spray Place 2 sprays into both nostrils 2 (two) times daily.     . furosemide (LASIX) 20 MG tablet TAKE 1 TABLET(20 MG) BY MOUTH DAILY AS NEEDED FOR FLUID RETENTION OR SWELLING 30 tablet 8  . hydrocortisone 2.5 % cream APPLY EXTERNALLY TO THE AFFECTED AREA TWICE DAILY AS NEEDED (Patient  taking differently: Apply 1 application topically 2 (two) times daily as needed (for skin irritation/rash). ) 30 g 0  . metFORMIN (GLUCOPHAGE) 1000 MG tablet Take 1 tablet (1,000 mg total) by mouth 2 (two) times daily with a meal. 180 tablet 3  . montelukast (SINGULAIR) 10 MG tablet Take 10 mg by mouth at bedtime.      . Multiple Vitamins-Minerals (MULTIVITAMIN WITH MINERALS) tablet Take 1 tablet by mouth daily.    . nitrofurantoin, macrocrystal-monohydrate, (MACROBID) 100 MG capsule TAKE AS DIRECTED POST COITAL (Patient taking differently: Take 100 mg by mouth daily as needed (post coital). ) 30 capsule 0  . omeprazole (PRILOSEC) 40 MG capsule Take 40 mg by mouth at bedtime.    . ondansetron (ZOFRAN) 4 MG tablet Take 1 tablet (4 mg total) by mouth every 8 (eight) hours as needed for nausea or vomiting. 30 tablet 0  . pantoprazole (PROTONIX) 40 MG tablet TAKE 1 TABLET(40 MG) BY MOUTH DAILY 30 tablet 0  . PRESCRIPTION MEDICATION Allergy Shots    . rosuvastatin (CRESTOR) 5 MG tablet TAKE 1 TABLET(5 MG) BY MOUTH DAILY 90 tablet 3  . sodium chloride HYPERTONIC 3 % nebulizer solution Take by nebulization 2 (two) times daily. 300 mL 11  . SPIRIVA RESPIMAT 1.25 MCG/ACT AERS Inhale 2 puffs into the lungs daily.   5   No current facility-administered medications on file prior to visit.     Allergies  Allergen Reactions  . Butorphanol Tartrate Other (See Comments)    REACTION: hallucinations  . Hydrocodone Other (See Comments)    HALLUCINATIONS  . Roxicet [Oxycodone-Acetaminophen] Other (See Comments)    HALLUCINATIONS  . Penicillins Hives    Occurred at age 37/CHILDHOOD ALLERGY Has patient had a PCN reaction causing immediate rash, facial/tongue/throat swelling, SOB or lightheadedness with hypotension: Unknown Has patient had a PCN reaction causing severe rash involving mucus membranes or skin necrosis: Unknown Has patient had a PCN reaction that required hospitalization: Unknown Has patient had a  PCN reaction occurring within the last 10 years: No If all of the above answers are "NO", then may proceed with Cephalosporin use.     Past Medical History:  Diagnosis Date  . Asthma, moderate persistent    sees Dr Harold Hedge  . Diabetes mellitus    A1C 6.2---->  2009  . Family history of adverse reaction to anesthesia    sister had cardiac arrest with rhinoplasty  . Fibrocystic breast   . GERD (gastroesophageal reflux disease)   . HH (hiatus hernia) 09/2014   Dr. [name]  . History of echocardiogram    Echo 5/19:  EF 55-60, normal wall motion, PASP 41  . History of hiatal hernia   . History of nuclear stress test    Nuclear stress test 5/19:  EF 64, normal perfusion, Low risk  . Hot flashes   . Hyperlipidemia   . Normal nuclear stress test 2006  . Palpitations    PACs sees cards   . Recurrent cystitis    took  postcoital antibiotic for years, symptoms better after her hysterectomy 1996, symptoms re-surface in 2011  . RUQ abdominal pain 2016   EGD showed small HH, otherwise normal  . Wears glasses     Past Surgical History:  Procedure Laterality Date  . ABDOMINAL HYSTERECTOMY  1996   no oophorectomy, d/tendometriosis and fibroids approximately 1996  . BREAST EXCISIONAL BIOPSY Right 03/12/2014   [facility]  . BREAST LUMPECTOMY WITH RADIOACTIVE SEED LOCALIZATION Right 03/12/2014   Procedure: BREAST LUMPECTOMY WITH RADIOACTIVE SEED LOCALIZATION;  Surgeon: Erroll Luna, MD;  Location: [facility];  Service: General;  Laterality: Right;  . BREAST SURGERY     Lumpectomy-- BENIGN   . CATARACT EXTRACTION Bilateral 2011  . CESAREAN SECTION    . COSMETIC SURGERY  01/2017   [facility] Touch w/ GYN  . DILATION AND CURETTAGE OF UTERUS    . LIPOMA EXCISION  1978  . RIGHT HEART CATH N/A 11/26/2017   Procedure: RIGHT HEART CATH;  Surgeon: Larey Dresser, MD;  Location: [facility];  Service: Cardiovascular;  Laterality: N/A;  . UPPER GASTROINTESTINAL ENDOSCOPY    .  UPPER GI ENDOSCOPY  09/11/2014   Small Hiatus Hernia, Dr. [name]    Family History  Problem Relation Age of Onset  . Mitral valve prolapse Mother   . Breast cancer Mother 26       M had ca insitu (age 27), aunt   . Dementia Mother   . Heart disease Father 38       early   . Diabetes Father   . Kidney failure Father   . Sleep apnea Father   . Brain cancer Other        2 fam members   . Lung cancer Other        uncle, heavy smoker  . Breast cancer Maternal Aunt 60  . CAD Paternal Grandmother 44  . CAD Paternal Grandfather 70  . Colon cancer Neg Hx     Social History   Socioeconomic History  . Marital status: Married    Spouse name: Not on file  . Number of children: 1  . Years of education: College  . Highest education level: Not on file  Occupational History  . Occupation: retired principal  Scientific laboratory technician  . Financial resource strain: Not on file  . Food insecurity:    Worry: Not on file    Inability: Not on file  . Transportation needs:    Medical: Not on file    Non-medical: Not on file  Tobacco Use  . Smoking status: Never Smoker  . Smokeless tobacco: Never Used  Substance and Sexual Activity  . Alcohol use: No  . Drug use: No  . Sexual activity: Yes  Lifestyle  . Physical activity:    Days per week: Not on file    Minutes per session: Not on file  . Stress: Not on file  Relationships  . Social connections:    Talks on phone: Not on file    Gets together: Not on file    Attends religious service: Not  on file    Active member of club or organization: Not on file    Attends meetings of clubs or organizations: Not on file    Relationship status: Not on file  . Intimate partner violence:    Fear of current or ex partner: Not on file    Emotionally abused: Not on file    Physically abused: Not on file    Forced sexual activity: Not on file  Other Topics Concern  . 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   Review of Systems  Constitutional: Negative.   HENT: Negative.   Eyes: Negative.   Respiratory: Negative.   Cardiovascular: Negative.   Gastrointestinal: Negative.   Endocrine: Negative.   Genitourinary: Negative.   Musculoskeletal: Negative.   Skin: Negative.   Allergic/Immunologic: Negative.   Neurological: Negative.   Hematological: Negative.   Psychiatric/Behavioral: Negative.   All other systems reviewed and are negative.      Objective:    BP 120/72 (BP Location: Left Arm, Patient Position: Sitting, Cuff Size: Normal)   Pulse 84   Temp 98.1 F (36.7 C) (Oral)   Ht 5' 4" (1.626 m)   Wt 180 lb (81.6 kg)   SpO2 98%   BMI 30.90 kg/m    Physical Exam   General:  Well-developed,well-nourished,in no acute distress; alert,appropriate and cooperative throughout examination Head:  normocephalic and atraumatic.   Eyes:  vision grossly intact, PERRL Ears:  R ear normal and L ear normal externally, TMs clear bilaterally Nose:  no external deformity.   Mouth:  good dentition.   Neck:  No deformities, masses, or tenderness noted. Lungs:  Normal respiratory effort, chest expands symmetrically. Lungs are clear to auscultation, no crackles or wheezes. Heart:  Normal rate and regular rhythm. S1 and S2 normal without gallop, murmur, click, rub or other extra sounds. Abdomen:  Bowel sounds positive,abdomen soft and non-tender without masses, organomegaly or hernias noted.s noted of thoracic or lumbar spine.   Extremities:  No clubbing, cyanosis, edema, or deformity noted with normal full range of motion of all joints.   Neurologic:  alert & oriented X3 and gait normal.   Skin:  Intact without suspicious lesions or rashes Cervical Nodes:  No lymphadenopathy  noted Axillary Nodes:  No palpable lymphadenopathy Psych:  Cognition and judgment appear intact. Alert and cooperative with normal attention span and concentration. No apparent delusions, illusions, hallucinations       Assessment & Plan:   Hyperlipidemia, unspecified hyperlipidemia type  Type 2 diabetes mellitus without complication, unspecified whether long term insulin use (HCC)  Thyroid nodule No follow-ups on file.

## 2018-01-16 ENCOUNTER — Ambulatory Visit (INDEPENDENT_AMBULATORY_CARE_PROVIDER_SITE_OTHER): Payer: BC Managed Care – PPO | Admitting: Family Medicine

## 2018-01-16 ENCOUNTER — Encounter: Payer: Self-pay | Admitting: Family Medicine

## 2018-01-16 VITALS — BP 120/72 | HR 84 | Temp 98.1°F | Ht 64.0 in | Wt 180.0 lb

## 2018-01-16 DIAGNOSIS — E119 Type 2 diabetes mellitus without complications: Secondary | ICD-10-CM

## 2018-01-16 DIAGNOSIS — E041 Nontoxic single thyroid nodule: Secondary | ICD-10-CM | POA: Diagnosis not present

## 2018-01-16 DIAGNOSIS — E785 Hyperlipidemia, unspecified: Secondary | ICD-10-CM

## 2018-01-16 MED ORDER — METFORMIN HCL 500 MG PO TABS: ORAL_TABLET | ORAL | 3 refills | Status: DC

## 2018-01-16 NOTE — Assessment & Plan Note (Signed)
Still having some symptoms of hypoglycemia. Will decrease Metformin to 500 mg every morning and 1000 mg daily with supper. Follow up with 2 months.

## 2018-01-16 NOTE — Patient Instructions (Signed)
Great to see you. Happy Holidays!   We are decreasing your Metformin to 500 mg every morning with breakfast and 1000 mg nightly with supper.

## 2018-01-16 NOTE — Assessment & Plan Note (Signed)
Followed by endo. Scheduled to have follow up US in 6 moths.

## 2018-01-16 NOTE — Assessment & Plan Note (Signed)
Well controlled on Crestor. Keep appointment with Dr. Delton SeeNelson in January.

## 2018-01-22 LAB — HM DIABETES EYE EXAM

## 2018-01-29 ENCOUNTER — Other Ambulatory Visit: Payer: Self-pay | Admitting: Family Medicine

## 2018-01-29 DIAGNOSIS — K219 Gastro-esophageal reflux disease without esophagitis: Secondary | ICD-10-CM

## 2018-02-04 ENCOUNTER — Encounter: Payer: Self-pay | Admitting: Family Medicine

## 2018-02-18 ENCOUNTER — Ambulatory Visit: Payer: BC Managed Care – PPO | Admitting: Cardiology

## 2018-02-18 ENCOUNTER — Encounter: Payer: Self-pay | Admitting: Cardiology

## 2018-02-18 VITALS — BP 128/70 | HR 87 | Ht 64.0 in | Wt 183.2 lb

## 2018-02-18 DIAGNOSIS — E785 Hyperlipidemia, unspecified: Secondary | ICD-10-CM

## 2018-02-18 DIAGNOSIS — I5032 Chronic diastolic (congestive) heart failure: Secondary | ICD-10-CM

## 2018-02-18 DIAGNOSIS — I471 Supraventricular tachycardia: Secondary | ICD-10-CM

## 2018-02-18 DIAGNOSIS — I272 Pulmonary hypertension, unspecified: Secondary | ICD-10-CM

## 2018-02-18 NOTE — Progress Notes (Signed)
Patient Name: Karen Barnes Date of Encounter: 02/18/2018  Primary Care Provider:  Dianne Dun, MD Primary Cardiologist:  Tobias Alexander, MD  Problem List   Past Medical History:  Diagnosis Date  . Asthma, moderate persistent    sees Dr Irena Cords  . Diabetes mellitus    A1C 6.2---->  2009  . Family history of adverse reaction to anesthesia    sister had cardiac arrest with rhinoplasty  . Fibrocystic breast   . GERD (gastroesophageal reflux disease)   . HH (hiatus hernia) 09/2014   Dr. Ewing Schlein  . History of echocardiogram    Echo 5/19: EF 55-60, normal wall motion, PASP 41  . History of hiatal hernia   . History of nuclear stress test    Nuclear stress test 5/19:  EF 64, normal perfusion, Low risk  . Hot flashes   . Hyperlipidemia   . Normal nuclear stress test 2006  . Palpitations    PACs sees cards   . Recurrent cystitis    took  postcoital antibiotic for years, symptoms better after her hysterectomy 1996, symptoms re-surface in 2011  . RUQ abdominal pain 2016   EGD showed small HH, otherwise normal  . Wears glasses    Past Surgical History:  Procedure Laterality Date  . ABDOMINAL HYSTERECTOMY  1996   no oophorectomy, d/tendometriosis and fibroids approximately 1996  . BREAST EXCISIONAL BIOPSY Right 03/12/2014   [facility]  . BREAST LUMPECTOMY WITH RADIOACTIVE SEED LOCALIZATION Right 03/12/2014   Procedure: BREAST LUMPECTOMY WITH RADIOACTIVE SEED LOCALIZATION;  Surgeon: Harriette Bouillon, MD;  Location: [facility];  Service: General;  Laterality: Right;  . BREAST SURGERY     Lumpectomy-- BENIGN   . CATARACT EXTRACTION Bilateral 2011  . CESAREAN SECTION    . COSMETIC SURGERY  01/2017   MonaLisa Touch w/ GYN  . DILATION AND CURETTAGE OF UTERUS    . LIPOMA EXCISION  1978  . RIGHT HEART CATH N/A 11/26/2017   Procedure: RIGHT HEART CATH;  Surgeon: Laurey Morale, MD;  Location: [facility] INVASIVE CV LAB;  Service: Cardiovascular;  Laterality: N/A;  . UPPER  GASTROINTESTINAL ENDOSCOPY    . UPPER GI ENDOSCOPY  09/11/2014   Small Hiatus Hernia, Dr. Ewing Schlein   Allergies  Allergies  Allergen Reactions  . Butorphanol Tartrate Other (See Comments)    REACTION: hallucinations  . Hydrocodone Other (See Comments)    HALLUCINATIONS  . Roxicet [Oxycodone-Acetaminophen] Other (See Comments)    HALLUCINATIONS  . Penicillins Hives    Occurred at age 3/CHILDHOOD ALLERGY Has patient had a PCN reaction causing immediate rash, facial/tongue/throat swelling, SOB or lightheadedness with hypotension: Unknown Has patient had a PCN reaction causing severe rash involving mucus membranes or skin necrosis: Unknown Has patient had a PCN reaction that required hospitalization: Unknown Has patient had a PCN reaction occurring within the last 10 years: No If all of the above answers are "NO", then may proceed with Cephalosporin use.     HPI Karen Barnes is a 58 y.o. female with diabetes, hypertension, hyperlipidemia, palpitations, prior subdural hematoma (2/2 to a mechanical fall in 01/2017), asthma, strong family hx of CAD and AFib.  She has not been able to tolerate beta-blockers in the past due to asthma, but her PVCs have been controlled on Diltiazem.  She was last seen by [name], PA-C 04/19/17 for worsening palpitations that were somewhat prolonged.  An event monitor was arranged.    The patient's chest pain syndrome likely  is related to reflux which may have been aggravated by the intercurrent addition of Cardizem for palpitations.  This was chosen as opposed to beta-blockers because of a history of allergies for which she has an EpiPen.  30-day event monitor from March 2019 that showed PACs PVCs and episodes of atrial tachycardia.  Since Dr. Graciela HusbandsKlein discontinued Cardizem she has had several episodes of PVCs mostly at night and when she is tired but she tolerates them well.  She would rather not be started on another medication unless necessary.   Significant stressors -taking care of her mother with dementia.    She underwent right-sided cardiac catheterization in October 2019 that showed no evidence of pulmonary arterial hypertension and only mild pulmonary hypertension most probably secondary to diastolic heart failure.   She has been dealing with bad asthma for which she is on steroids now and taper, her hemoglobin A1c has been elevated and she also underwent biopsy for thyroid nodule.    She is very motivated to get better, she walks 5 times a week for 2 miles and reports no shortness of breath or chest pain with that.  She continues to have mild lower extremity edema, she is very strict with low-sodium diet and cooks at home.  Her goal is to lose 30 pounds and continue exercising.    Her stressors in life are taking care of her elderly parents were recently both moved to a nursing home.   Home Medications  Prior to Admission medications   Medication Sig Start Date End Date Taking? Authorizing Provider  albuterol (PROVENTIL HFA;VENTOLIN HFA) 108 (90 BASE) MCG/ACT inhaler Inhale 2 puffs into the lungs every 6 (six) hours as needed.    [provider]  azithromycin (ZITHROMAX) 250 MG tablet 2 tabs po qd x 1 day; 1 tablet per day x 4 days; 06/28/17   Olive BassMurray, Laura Woodruff, FNP  benzonatate (TESSALON) 100 MG capsule Take 1 capsule (100 mg total) by mouth 3 (three) times daily as needed. 06/28/17   Olive BassMurray, Laura Woodruff, FNP  bifidobacterium infantis (ALIGN) capsule Take 1 capsule by mouth daily.    [provider]  budesonide-formoterol (SYMBICORT) 160-4.5 MCG/ACT inhaler Inhale 2 puffs into the lungs 2 (two) times daily. 09/14/15   [provider]  Calcium Carbonate-Vitamin D (CALCIUM 600+D) 600-200 MG-UNIT TABS Take 1 tablet by mouth daily.    [provider]  Continuous Blood Gluc Receiver (FREESTYLE LIBRE 14 DAY READER) DEVI USE AS DIRECTED 05/28/17   Wanda PlumpPaz, Jose E, MD  Continuous Blood Gluc Sensor  (FREESTYLE LIBRE 14 DAY SENSOR) MISC USE AS DIRECTED PER MD 08/13/17   Wanda PlumpPaz, Jose E, MD  Continuous Blood Gluc Sensor (FREESTYLE LIBRE SENSOR SYSTEM) MISC CHECK BLOOD SUGAR DAILY AS DIRECTED. CHANGE DEVICE EVERY 10 DAYS 09/29/16   Wanda PlumpPaz, Jose E, MD  diclofenac sodium (VOLTAREN) 1 % GEL Apply 2 g topically 4 (four) times daily. 11/02/16   Wanda PlumpPaz, Jose E, MD  diltiazem (CARDIZEM) 30 MG tablet Take 1 tablet (30 mg total) by mouth every 6 (six) hours as needed (for palpitations). Patient not taking: Reported on 06/28/2017 04/19/17   Manson PasseyBhagat, Bhavinkumar, PA  EPINEPHrine (AUVI-Q) 0.3 mg/0.3 mL IJ SOAJ injection Inject 0.3 mg into the muscle as directed.    [provider]  eszopiclone (LUNESTA) 2 MG TABS tablet Take 1 tablet (2 mg total) by mouth at bedtime as needed for up to 1 day for sleep. Take immediately before bedtime 06/27/17 06/28/17  Quintella Reicherturner, Traci R, MD  fexofenadine (ALLEGRA) 180 MG tablet Take 180 mg by mouth every morning.     [provider]  fluticasone (FLONASE) 50 MCG/ACT nasal spray Place 2 sprays into the nose daily. AND IF NEEDED 2 SPRAYS AT NIGHT    [provider]  hydrocortisone 2.5 % cream APPLY EXTERNALLY TO THE AFFECTED AREA TWICE DAILY AS NEEDED 01/28/16   Wanda Plump, MD  metFORMIN (GLUCOPHAGE) 1000 MG tablet TAKE 1&1/2 TABLETS BY MOUTH EVERY MORNING AND 1 TABLET EVERY EVENING 06/25/17   Paz, Nolon Rod, MD  montelukast (SINGULAIR) 10 MG tablet Take 10 mg by mouth at bedtime.      [provider]  Multiple Vitamins-Minerals (MULTIVITAMIN WITH MINERALS) tablet Take 1 tablet by mouth daily.    [provider]  nitrofurantoin, macrocrystal-monohydrate, (MACROBID) 100 MG capsule TAKE AS DIRECTED POST COITAL 01/28/16   Wanda Plump, MD  omeprazole (PRILOSEC) 40 MG capsule Take 1 capsule (40 mg total) by mouth 2 (two) times daily. 06/24/15   Wanda Plump, MD  PRESCRIPTION MEDICATION Allergy Shots    [provider]  rosuvastatin (CRESTOR) 5 MG tablet  TAKE 1 TABLET(5 MG) BY MOUTH DAILY 12/22/16   Lars Masson, MD  sitaGLIPtin (JANUVIA) 100 MG tablet Take 1 tablet (100 mg total) by mouth daily. 08/13/17   Wanda Plump, MD  SPIRIVA RESPIMAT 1.25 MCG/ACT AERS INL 2 PFS PO QD 06/23/17   [provider]  Tiotropium Bromide Monohydrate (SPIRIVA RESPIMAT) 2.5 MCG/ACT AERS Inhale 2 puffs into the lungs daily.    [provider]    Family History  Family History  Problem Relation Age of Onset  . Mitral valve prolapse Mother   . Breast cancer Mother 71       M had ca insitu (age 22), aunt   . Dementia Mother   . Heart disease Father 19       early   . Diabetes Father   . Kidney failure Father   . Sleep apnea Father   . Brain cancer Other        2 fam members   . Lung cancer Other        uncle, heavy smoker  . Breast cancer Maternal Aunt 60  . CAD Paternal Grandmother 14  . CAD Paternal Grandfather 59  . Colon cancer Neg Hx     Social History  Social History   Socioeconomic History  . Marital status: Married    Spouse name: Not on file  . Number of children: 1  . Years of education: College  . Highest education level: Not on file  Occupational History  . Occupation: retired principal  Engineer, production  . Financial resource strain: Not on file  . Food insecurity:    Worry: Not on file    Inability: Not on file  . Transportation needs:    Medical: Not on file    Non-medical: Not on file  Tobacco Use  . Smoking status: Never Smoker  . Smokeless tobacco: Never Used  Substance and Sexual Activity  . Alcohol use: No  . Drug use: No  . Sexual activity: Yes  Lifestyle  . Physical activity:    Days per week: Not on file    Minutes per session: Not on file  . Stress: Not on file  Relationships  . Social connections:    Talks on phone: Not on file    Gets together: Not on file    Attends religious service: Not on  file    Active member of club or organization: Not on file    Attends meetings of clubs or  organizations: Not on file    Relationship status: Not on file  . Intimate partner violence:    Fear of current or ex partner: Not on file    Emotionally abused: Not on file    Physically abused: Not on file    Forced sexual activity: Not on file  Other Topics Concern  . Not on file  Social History Narrative   Household-- pt and husband    Rare caffeine use   Daughter independent, 1 G-child   Engineer, structuralCaregiver for F and M, both w/ dementia     Review of Systems, as per HPI, otherwise negative General:  No chills, fever, night sweats or weight changes.  Cardiovascular:  No chest pain, dyspnea on exertion, edema, orthopnea, palpitations, paroxysmal nocturnal dyspnea. Dermatological: No rash, lesions/masses Respiratory: No cough, dyspnea Urologic: No hematuria, dysuria Abdominal:   No nausea, vomiting, diarrhea, bright red blood per rectum, melena, or hematemesis Neurologic:  No visual changes, wkns, changes in mental status. All other systems reviewed and are otherwise negative except as noted above.  Physical Exam  Blood pressure 128/70, pulse 87, height 5\' 4"  (1.626 m), weight 183 lb 3.2 oz (83.1 kg), SpO2 98 %.  General: Pleasant, NAD Psych: Normal affect. Neuro: Alert and oriented X 3. Moves all extremities spontaneously. HEENT: Normal  Neck: Supple without bruits or JVD. Lungs:  Resp regular and unlabored, CTA. Heart: RRR no s3, s4, or murmurs. Abdomen: Soft, non-tender, non-distended, BS + x 4.  Extremities: No clubbing, cyanosis, bilateral mild lower extremity edema. DP/PT/Radials 2+ and equal bilaterally.  Labs:  No results for input(s): CKTOTAL, CKMB, TROPONINI in the last 72 hours. Lab Results  Component Value Date   WBC 5.0 11/26/2017   HGB 11.0 (L) 11/26/2017   HCT 36.2 11/26/2017   MCV 87.2 11/26/2017   PLT 292 11/26/2017    No results found for: DDIMER Invalid input(s): POCBNP    Component Value Date/Time   NA 142 12/11/2017 0930   K 3.9 12/11/2017 0930    CL 101 12/11/2017 0930   CO2 31 12/11/2017 0930   GLUCOSE 81 12/11/2017 0930   GLUCOSE 118 08/26/2009   BUN 21 12/11/2017 0930   CREATININE 0.99 12/11/2017 0930   CREATININE 0.93 09/24/2015 1454   CALCIUM 10.0 12/11/2017 0930   PROT 7.4 12/11/2017 0930   ALBUMIN 4.5 12/11/2017 0930   AST 17 12/11/2017 0930   ALT 15 12/11/2017 0930   ALKPHOS 57 12/11/2017 0930   BILITOT 0.4 12/11/2017 0930   GFRNONAA >60 11/26/2017 0701   GFRAA >60 11/26/2017 0701   Lab Results  Component Value Date   CHOL 166 10/03/2017   HDL 75.30 10/03/2017   LDLCALC 75 10/03/2017   TRIG 78.0 10/03/2017    Accessory Clinical Findings  Echocardiogram - 06/2017 - Left ventricle: The cavity size was normal. Wall thickness was   normal. Systolic function was normal. The estimated ejection   fraction was in the range of 55% to 60%. Wall motion was normal;   there were no regional wall motion abnormalities. Left   ventricular diastolic function parameters were normal. - Pulmonary arteries: Systolic pressure was mildly increased. PA   peak pressure: 41 mm Hg (S).  Impressions:  - Normal LV systolic and diastolic function; mild pulmonary   hypertension.  Stress test: May 2019  Nuclear stress EF: 64%.  Blood pressure demonstrated  a normal response to exercise.  There was no ST segment deviation noted during stress.  No T wave inversion was noted during stress.  The study is normal.  This is a low risk study.  Low risk stress nuclear study with normal perfusion and normal left ventricular regional and global systolic function.  Sleep study: 08/2017 IMPRESSIONS - No significant obstructive sleep apnea occurred during this study (AHI = 3.3/h). - No significant central sleep apnea occurred during this study (CAI = 0.0/h). - no significant oxygen desaturation was noted during this study (Min O2 = 0.0%). - The patient snored with moderate snoring volume. - No cardiac abnormalities were noted during  this study. - Clinically significant periodic limb movements did not occur during sleep. No significant associated arousals.  DIAGNOSIS - Normal Study  ECG -not done today.  Right-sided cath October 2019 1. Mildly elevated PCWP, suspect LV diastolic dysfunction.  2. 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     Assessment & Plan  1.  Palpitations 30-day event monitor showed PACs PVCs and atrial tachycardias.  She is allergic to metoprolol, currently well controlled by Cardizem.  2.  Mild pulmonary hypertension -secondary to diastolic dysfunction, no evidence of pulmonary arterial hypertension on right-sided cath in October 2019, she is doing great job with low-sodium diet and exercise.  3.  Hyperlipidemia, on statins that is very tolerated.  4.  Diastolic CHF -continue Lasix as needed and low-sodium diet.  5.  Weight loss -plan for 30 pound weight loss in the next 6 months.  Follow up in 6 months.  Tobias Alexander, MD, [facility] 02/18/2018, 8:47 AM

## 2018-02-18 NOTE — Patient Instructions (Signed)
Medication Instructions:   Your physician recommends that you continue on your current medications as directed. Please refer to the Current Medication list given to you today.  If you need a refill on your cardiac medications before your next appointment, please call your pharmacy.       Follow-Up: At [facility], you and your health needs are our priority.  As part of our continuing mission to provide you with exceptional heart care, we have created designated Provider Care Teams.  These Care Teams include your primary Cardiologist (physician) and Advanced Practice Providers (APPs -  Physician Assistants and Nurse Practitioners) who all work together to provide you with the care you need, when you need it. You will need a follow up appointment in 6 months.  Please call our office 2 months in advance to schedule this appointment.  You may see Katarina Nelson, MD or one of the following Advanced Practice Providers on your designated Care Team:   Brittainy Simmons, PA-C Dayna Dunn, PA-C . Michele Lenze, PA-C     

## 2018-03-19 ENCOUNTER — Ambulatory Visit: Payer: BC Managed Care – PPO | Admitting: Family Medicine

## 2018-03-20 ENCOUNTER — Ambulatory Visit: Payer: BC Managed Care – PPO | Admitting: Family Medicine

## 2018-03-25 ENCOUNTER — Encounter: Payer: Self-pay | Admitting: Family Medicine

## 2018-03-25 ENCOUNTER — Ambulatory Visit: Payer: BC Managed Care – PPO | Admitting: Family Medicine

## 2018-03-25 VITALS — BP 106/62 | HR 93 | Temp 97.5°F | Ht 64.0 in | Wt 185.2 lb

## 2018-03-25 DIAGNOSIS — E119 Type 2 diabetes mellitus without complications: Secondary | ICD-10-CM

## 2018-03-25 LAB — POCT GLYCOSYLATED HEMOGLOBIN (HGB A1C)
HEMOGLOBIN A1C: 6.4 % — AB (ref 4.0–5.6)
HbA1c POC (<> result, manual entry): 6.4 % (ref 4.0–5.6)

## 2018-03-25 NOTE — Patient Instructions (Signed)
Great to see you.  Keep checking your blood sugars. Let me know you have lows.

## 2018-03-25 NOTE — Assessment & Plan Note (Signed)
Well controlled. No changes made to current dose of Metformin. She will continue to check FSBS 4 times daily.  She will update me if she has low blood sugars.

## 2018-03-25 NOTE — Progress Notes (Signed)
Subjective:   Patient ID: Karen Barnes, female    DOB: 1960/11/21, 58 y.o.   MRN: [id number]  Karen Barnes is a pleasant 58 y.o. year old female who presents to clinic today with Diabetes (Patient was last seen on 12.11.19 and at that time it was decided to decrease her metformin to 500mg  but this had to be increased to 1k bid.  Right now it is 172 and is 1 hour pp.)  on 03/25/2018  HPI:  Here for 2 months follow up.  DM- currently taking Metformin 1000 mg twice daily. Denies any episodes of hypoglycemia.   Lab Results  Component Value Date   HGBA1C 6.4 (A) 03/25/2018   HGBA1C 6.4 03/25/2018   Current Outpatient Medications on File Prior to Visit  Medication Sig Dispense Refill  . albuterol (PROVENTIL HFA;VENTOLIN HFA) 108 (90 BASE) MCG/ACT inhaler Inhale 2 puffs into the lungs every 6 (six) hours as needed.    Marland Kitchen albuterol (PROVENTIL) (2.5 MG/3ML) 0.083% nebulizer solution Take 2.5 mg by nebulization every 6 (six) hours as needed for wheezing or shortness of breath.    Marland Kitchen aspirin EC 81 MG tablet Take 81 mg by mouth daily.    . bifidobacterium infantis (ALIGN) capsule Take 1 capsule by mouth daily.    . budesonide-formoterol (SYMBICORT) 160-4.5 MCG/ACT inhaler Inhale 2 puffs into the lungs 2 (two) times daily.    . Calcium Carbonate-Vitamin D (CALCIUM 600+D) 600-200 MG-UNIT TABS Take 1 tablet by mouth daily.    . Continuous Blood Gluc Sensor (FREESTYLE LIBRE 14 DAY SENSOR) MISC USE AS DIRECTED PER MD 2 each 5  . Continuous Blood Gluc Sensor (FREESTYLE LIBRE SENSOR SYSTEM) MISC CHECK BLOOD SUGAR DAILY AS DIRECTED. CHANGE DEVICE EVERY 10 DAYS 3 each 12  . diltiazem (CARDIZEM) 30 MG tablet Take 1 tablet (30 mg total) by mouth every 6 (six) hours as needed (for palpitations). 30 tablet 4  . EPINEPHrine (AUVI-Q) 0.3 mg/0.3 mL IJ SOAJ injection Inject 0.3 mg into the muscle as directed. For immunotherapy (allergy shots) anaphylactic reactions    . fexofenadine (ALLEGRA) 180 MG tablet  Take 180 mg by mouth every morning.     . fluticasone (FLONASE) 50 MCG/ACT nasal spray Place 2 sprays into both nostrils 2 (two) times daily.     . furosemide (LASIX) 20 MG tablet TAKE 1 TABLET(20 MG) BY MOUTH DAILY AS NEEDED FOR FLUID RETENTION OR SWELLING 30 tablet 8  . hydrocortisone 2.5 % cream APPLY EXTERNALLY TO THE AFFECTED AREA TWICE DAILY AS NEEDED (Patient taking differently: Apply 1 application topically 2 (two) times daily as needed (for skin irritation/rash). ) 30 g 0  . montelukast (SINGULAIR) 10 MG tablet Take 10 mg by mouth at bedtime.      . Multiple Vitamins-Minerals (MULTIVITAMIN WITH MINERALS) tablet Take 1 tablet by mouth daily.    . nitrofurantoin, macrocrystal-monohydrate, (MACROBID) 100 MG capsule TAKE AS DIRECTED POST COITAL (Patient taking differently: Take 100 mg by mouth daily as needed (post coital). ) 30 capsule 0  . omeprazole (PRILOSEC) 40 MG capsule Take 40 mg by mouth at bedtime.    . ondansetron (ZOFRAN) 4 MG tablet Take 1 tablet (4 mg total) by mouth every 8 (eight) hours as needed for nausea or vomiting. 30 tablet 0  . pantoprazole (PROTONIX) 40 MG tablet TAKE 1 TABLET(40 MG) BY MOUTH DAILY 30 tablet 1  . PRESCRIPTION MEDICATION Allergy Shots    . rosuvastatin (CRESTOR) 5 MG tablet TAKE 1 TABLET(5 MG) BY MOUTH  DAILY 90 tablet 3  . sodium chloride HYPERTONIC 3 % nebulizer solution Take by nebulization 2 (two) times daily. 300 mL 11  . SPIRIVA RESPIMAT 1.25 MCG/ACT AERS Inhale 2 puffs into the lungs daily.   5  . metFORMIN (GLUCOPHAGE) 1000 MG tablet Take 1 tablet by mouth 2 (two) times daily.     No current facility-administered medications on file prior to visit.     Allergies  Allergen Reactions  . Butorphanol Tartrate Other (See Comments)    REACTION: hallucinations  . Hydrocodone Other (See Comments)    HALLUCINATIONS  . Roxicet [Oxycodone-Acetaminophen] Other (See Comments)    HALLUCINATIONS  . Penicillins Hives    Occurred at age 89/CHILDHOOD  ALLERGY Has patient had a PCN reaction causing immediate rash, facial/tongue/throat swelling, SOB or lightheadedness with hypotension: Unknown Has patient had a PCN reaction causing severe rash involving mucus membranes or skin necrosis: Unknown Has patient had a PCN reaction that required hospitalization: Unknown Has patient had a PCN reaction occurring within the last 10 years: No If all of the above answers are "NO", then may proceed with Cephalosporin use.     Past Medical History:  Diagnosis Date  . Asthma, moderate persistent    sees Dr Irena CordsVan Winkle  . Diabetes mellitus    A1C 6.2---->  2009  . Family history of adverse reaction to anesthesia    sister had cardiac arrest with rhinoplasty  . Fibrocystic breast   . GERD (gastroesophageal reflux disease)   . HH (hiatus hernia) 09/2014   Dr. Ewing SchleinMagod  . History of echocardiogram    Echo 5/19: EF 55-60, normal wall motion, PASP 41  . History of hiatal hernia   . History of nuclear stress test    Nuclear stress test 5/19:  EF 64, normal perfusion, Low risk  . Hot flashes   . Hyperlipidemia   . Normal nuclear stress test 2006  . Palpitations    PACs sees cards   . Recurrent cystitis    took  postcoital antibiotic for years, symptoms better after her hysterectomy 1996, symptoms re-surface in 2011  . RUQ abdominal pain 2016   EGD showed small HH, otherwise normal  . Wears glasses     Past Surgical History:  Procedure Laterality Date  . ABDOMINAL HYSTERECTOMY  1996   no oophorectomy, d/tendometriosis and fibroids approximately 1996  . BREAST EXCISIONAL BIOPSY Right 03/12/2014   [facility]  . BREAST LUMPECTOMY WITH RADIOACTIVE SEED LOCALIZATION Right 03/12/2014   Procedure: BREAST LUMPECTOMY WITH RADIOACTIVE SEED LOCALIZATION;  Surgeon: Harriette Bouillonhomas Cornett, MD;  Location: [facility];  Service: General;  Laterality: Right;  . BREAST SURGERY     Lumpectomy-- BENIGN   . CATARACT EXTRACTION Bilateral 2011  . CESAREAN SECTION    .  COSMETIC SURGERY  01/2017   [facility] Touch w/ GYN  . DILATION AND CURETTAGE OF UTERUS    . LIPOMA EXCISION  1978  . RIGHT HEART CATH N/A 11/26/2017   Procedure: RIGHT HEART CATH;  Surgeon: Laurey MoraleMcLean, Dalton S, MD;  [facility];  Service: Cardiovascular;  Laterality: N/A;  . UPPER GASTROINTESTINAL ENDOSCOPY    . UPPER GI ENDOSCOPY  09/11/2014   Small Hiatus Hernia, Dr. Ewing SchleinMagod    Family History  Problem Relation Age of Onset  . Mitral valve prolapse Mother   . Breast cancer Mother [id number]       M had ca insitu (age 60), aunt   . Dementia Mother   . Heart disease  Father 13       early   . Diabetes Father   . Kidney failure Father   . Sleep apnea Father   . Brain cancer Other        2 fam members   . Lung cancer Other        uncle, heavy smoker  . Breast cancer Maternal Aunt 60  . CAD Paternal Grandmother 59  . CAD Paternal Grandfather 60  . Colon cancer Neg Hx     Social History   Socioeconomic History  . Marital status: Married    Spouse name: Not on file  . Number of children: 1  . Years of education: College  . Highest education level: Not on file  Occupational History  . Occupation: retired principal  Engineer, production  . Financial resource strain: Not on file  . Food insecurity:    Worry: Not on file    Inability: Not on file  . Transportation needs:    Medical: Not on file    Non-medical: Not on file  Tobacco Use  . Smoking status: Never Smoker  . Smokeless tobacco: Never Used  Substance and Sexual Activity  . Alcohol use: No  . Drug use: No  . Sexual activity: Yes  Lifestyle  . Physical activity:    Days per week: Not on file    Minutes per session: Not on file  . Stress: Not on file  Relationships  . Social connections:    Talks on phone: Not on file    Gets together: Not on file    Attends religious service: Not on file    Active member of club or organization: Not on file    Attends meetings of clubs or organizations: Not on file     Relationship status: Not on file  . Intimate partner violence:    Fear of current or ex partner: Not on file    Emotionally abused: Not on file    Physically abused: Not on file    Forced sexual activity: Not on file  Other Topics Concern  . Not on file  Social History Narrative   Household-- pt and husband    Rare caffeine use   Daughter independent, 1 G-child   Engineer, structural for F and M, both w/ dementia   The PMH, PSH, Social History, Family History, Medications, and allergies have been reviewed in [facility], and have been updated if relevant.   Review of Systems  Respiratory: Negative.   Cardiovascular: Negative.   Endocrine: Negative.   Genitourinary: Negative.   Musculoskeletal: Negative.   Allergic/Immunologic: Negative.   Neurological: Negative.   Hematological: Negative.   Psychiatric/Behavioral: Negative.   All other systems reviewed and are negative.      Objective:    BP 106/62 (BP Location: Right Arm, Patient Position: Sitting, Cuff Size: Normal)   Pulse 93   Temp (!) 97.5 F (36.4 C) (Oral)   Ht 5\' 4"  (1.626 m)   Wt 185 lb 3.2 oz (84 kg)   SpO2 97%   BMI 31.79 kg/m   Wt Readings from Last 3 Encounters:  03/25/18 185 lb 3.2 oz (84 kg)  02/18/18 183 lb 3.2 oz (83.1 kg)  01/16/18 180 lb (81.6 kg)    Physical Exam   General:  Well-developed,well-nourished,in no acute distress; alert,appropriate and cooperative throughout examination Head:  normocephalic and atraumatic.   Eyes:  vision grossly intact, PERRL Ears:  R ear normal and L ear normal externally, TMs clear bilaterally Nose:  no external deformity.   Mouth:  good dentition.   Neck:  No deformities, masses, or tenderness noted. Lungs:  Normal respiratory effort, chest expands symmetrically. Lungs are clear to auscultation, no crackles or wheezes. Heart:  Normal rate and regular rhythm. S1 and S2 normal without gallop, murmur, click, rub or other extra sounds. Msk:  No deformity or scoliosis noted of  thoracic or lumbar spine.   Extremities:  No clubbing, cyanosis, edema, or deformity noted with normal full range of motion of all joints.   Neurologic:  alert & oriented X3 and gait normal.   Skin:  Intact without suspicious lesions or rashes Psych:  Cognition and judgment appear intact. Alert and cooperative with normal attention span and concentration. No apparent delusions, illusions, hallucinations        Assessment & Plan:   Type 2 diabetes mellitus without complication, unspecified whether long term insulin use (HCC) - Plan: POCT HgB A1C No follow-ups on file.

## 2018-03-30 ENCOUNTER — Other Ambulatory Visit: Payer: Self-pay | Admitting: Family Medicine

## 2018-03-30 DIAGNOSIS — K219 Gastro-esophageal reflux disease without esophagitis: Secondary | ICD-10-CM

## 2018-04-25 ENCOUNTER — Other Ambulatory Visit: Payer: Self-pay | Admitting: Pulmonary Disease

## 2018-05-03 ENCOUNTER — Telehealth: Payer: Self-pay | Admitting: Pulmonary Disease

## 2018-05-03 MED ORDER — PREDNISONE 10 MG PO TABS: ORAL_TABLET | ORAL | 0 refills | Status: DC

## 2018-05-03 MED ORDER — DOXYCYCLINE HYCLATE 100 MG PO TABS: 100.0000 mg | ORAL_TABLET | Freq: Two times a day (BID) | ORAL | 0 refills | Status: AC

## 2018-05-03 NOTE — Telephone Encounter (Signed)
I am sorry to hear the patient is not feeling well.  Can offer  Doxycycline >>> 1 100 mg tablet every 12 hours for 10 days >>>take with food  >>>wear sunscreen   Prednisone 10mg  tablet  >>>4 tabs for 2 days, then 3 tabs for 2 days, 2 tabs for 2 days, then 1 tab for 2 days, then stop >>>take with food  >>>take in the morning   Place the order  Please have the patient scheduled with a telephonic outreach with me in 2 weeks to ensure she is doing better.  Please ensure as well that she is using her flutter valve after using her albuterol/ hypertonic saline.   Bronchiectasis: This is the medical term which indicates that you have damage, dilated airways making you more susceptible to respiratory infection. Use a flutter valve 10 breaths twice a day or 4 to 5 breaths 4-5 times a day to help clear mucus out Let us know if you have cough with change in mucus color or fevers or chills.  At that point you would need an antibiotic. Maintain a healthy nutritious diet, eating whole foods Take your medications as prescribed     [name] FNP

## 2018-05-03 NOTE — Telephone Encounter (Signed)
Primary Pulmonologist: Dr. Kendrick Fries Last office visit and with whom: 12/21/2017 with Dr. Kendrick Fries What do we see them for (pulmonary problems): Bronchiectasis  Reason for call:  Spoke with pt. States that she is not feeling well. Reports cough, chest congestion, chest burning, wheezing and shortness of breath. Cough is producing dark green mucus. Denies chest tightness, fever, sick contacts or recent travel. Has been taking albuterol nebs and hypertonic neb solutions with minimal relief. Has not taken any OTC medications for her symptoms. Symptoms started 2-3 days ago.  In the last month, have you been in contact with someone who was confirmed or suspected to have Conoravirus / COVID-19?  No Have you traveled internationally or to an area with more than 100 reported cases of Coronavirus / COVID-19?   No  Do you have any of the following symptoms developed in the last 30 days? Fever: No Cough: Yes Shortness of breath: Yes  When did your symptoms start?  2-3 days ago  If the patient has a fever, what is the last reading?  (use n/a if patient denies fever)  N/A . IF THE PATIENT STATES THEY DO NOT OWN A THERMOMETER, THEY MUST GO AND PURCHASE ONE When did the fever start?: N/A Have you taken any medication to suppress a fever (ie Ibuprofen, Aleve, Tylenol)?: N/A  Arlys John - please advise. Thanks!

## 2018-05-03 NOTE — Telephone Encounter (Signed)
Spoke with pt. She is aware of Brian's recommendations. Rxs have been sent in. Pt has been scheduled for a televisit with Arlys John on 05/16/2018 at 9am. Nothing further was needed.

## 2018-05-14 ENCOUNTER — Other Ambulatory Visit: Payer: Self-pay | Admitting: Family Medicine

## 2018-05-14 DIAGNOSIS — R197 Diarrhea, unspecified: Secondary | ICD-10-CM

## 2018-05-14 DIAGNOSIS — R112 Nausea with vomiting, unspecified: Secondary | ICD-10-CM

## 2018-05-15 NOTE — Progress Notes (Signed)
Virtual Visit via Telephone Note  I connected with Karen Barnes on 05/16/18 at  9:00 AM EDT by telephone and verified that I am speaking with the correct person using two identifiers.   I discussed the limitations, risks, security and privacy concerns of performing an evaluation and management service by telephone and the availability of in person appointments. I also discussed with the patient that there may be a patient responsible charge related to this service. The patient expressed understanding and agreed to proceed.   History of Present Illness: 58 year old female never smoker, first seen in 2017 for asthma and a pulmonary nodule She has severe GERD and is followed by Dr. Ewing SchleinMagod.  She started on immunotherapy in 2017 She was born prematurely at 1926 weeks.   Patient of Dr. Kendrick FriesMcQuaid   Patient consented to consult via telephone: Yes People present and their role in pt care: Patient  Chief complaint: 2 week follow up / Bronchiectesis  58 year old female never smoker treated telephonically 2 weeks ago with prednisone and doxycycline for suspected bronchiectasis exacerbation.  Patient reports that symptoms did lessen while taking antibiotics and prednisone but as soon as she was off of them it came right back.  Patient admits that in the past azithromycin and doxycycline do not typically work for her.  She reports that she had an episode like this last year where she had 6 rounds of antibiotics and multiple rounds of steroids.  She reports that she did typically do better with Levaquin.  She reports she has a productive cough now with yellow and green mucus.  She continues to be adherent to her medical regimen of Symbicort 160, Spiriva Respimat 1.25, hypertonic saline nebs, albuterol nebs, Singulair, Allegra, Flonase, flutter valve use.  Patient reports she is using her flutter valve 2 times daily trying to get 30 breaths total daily.  Patient has known triggers of seasonal changes such as of  the spring and in the fall.  She has some minor allergies at this time.    Observations/Objective:  FENO 10/2017 on symbicort while sick: 22 ppm  Chest imaging November 07, 2017 high-resolution CT scan of the chest images : Mild cylindrical bronchiectasis particularly in the left lower lobe, some in the right lower lobe 10/18/2015 chest CT images  showing normal pulmonary parenchyma with the exception of a 3.4 mm nodule in the left lower lobe.  Pulmonary function test: October 2019 ratio 90%, FVC 2.56 L 76% predicted, total lung capacity 4.32 L 84% predicted, DLCO 20.7 mL 84% predicted  Labs: October 2019 complement levels within normal limits, IgG subclasses all normal, alpha-1 antitrypsin level normal, CBC showed 300 absolute eosinophil count  Heart catheterization: October 2019 right heart catheterization showed mean PA pressure 24, pulmonary capillary wedge pressure 19, PVR 0.7 Woods units   Lab Results  Component Value Date   NITRICOXIDE 22 11/02/2017    Assessment and Plan:  Bronchiectasis with acute exacerbation (HCC) Assessment: Recently treated with doxycycline as well as prednisone taper Patient now reporting that azithromycin and doxycycline do not typically work well for her She reports that symptoms did improve while taking antibiotics and prednisone, as soon as stopping antibiotics and prednisone symptoms came back with productive cough with yellow mucus Patient adherent to flutter valve use Patient adherent to all other medications  Plan: Levaquin 500 mg daily for 10 days Extended prednisone taper Continue flutter valve use Continue hypertonic saline Continue Symbicort 160 Continue Spiriva Respimat 1.25 Continue daily align probiotic 4-week  follow-up with our office for telephonic outreach May need to consider therapy vest in the future Consider culturing sputum in the future at next in person office visit  Asthmatic bronchitis with acute  exacerbation Assessment: Patient adherent to Symbicort 160 Patient adherent to Spiriva 1.25 Productive cough with yellow mucus  Plan: Levaquin today Prednisone today Continue flutter valve Continue Symbicort 160 Continue Spiriva Respimat 1.25 4-week telephonic follow-up  Follow Up Instructions:  Return in about 4 weeks (around 06/13/2018), or if symptoms worsen or fail to improve, for Follow up with [name] FNP-C.  I discussed the assessment and treatment plan with the patient. The patient was provided an opportunity to ask questions and all were answered. The patient agreed with the plan and demonstrated an understanding of the instructions.   The patient was advised to call back or seek an in-person evaluation if the symptoms worsen or if the condition fails to improve as anticipated.  I provided 26 minutes of non-face-to-face time during this encounter.   [name], NP

## 2018-05-16 ENCOUNTER — Other Ambulatory Visit: Payer: Self-pay

## 2018-05-16 ENCOUNTER — Other Ambulatory Visit: Payer: Self-pay | Admitting: Family Medicine

## 2018-05-16 ENCOUNTER — Ambulatory Visit (INDEPENDENT_AMBULATORY_CARE_PROVIDER_SITE_OTHER): Payer: BC Managed Care – PPO | Admitting: Pulmonary Disease

## 2018-05-16 ENCOUNTER — Encounter: Payer: Self-pay | Admitting: Pulmonary Disease

## 2018-05-16 ENCOUNTER — Telehealth: Payer: Self-pay | Admitting: Pulmonary Disease

## 2018-05-16 DIAGNOSIS — J45901 Unspecified asthma with (acute) exacerbation: Secondary | ICD-10-CM

## 2018-05-16 DIAGNOSIS — J471 Bronchiectasis with (acute) exacerbation: Secondary | ICD-10-CM | POA: Diagnosis not present

## 2018-05-16 MED ORDER — PREDNISONE 10 MG PO TABS: ORAL_TABLET | ORAL | 0 refills | Status: DC

## 2018-05-16 MED ORDER — LEVOFLOXACIN 500 MG PO TABS: 500.0000 mg | ORAL_TABLET | Freq: Every day | ORAL | 0 refills | Status: DC

## 2018-05-16 NOTE — Assessment & Plan Note (Signed)
Assessment: Patient adherent to Symbicort 160 Patient adherent to Spiriva 1.25 Productive cough with yellow mucus  Plan: Levaquin today Prednisone today Continue flutter valve Continue Symbicort 160 Continue Spiriva Respimat 1.25 4-week telephonic follow-up

## 2018-05-16 NOTE — Assessment & Plan Note (Signed)
Assessment: Recently treated with doxycycline as well as prednisone taper Patient now reporting that azithromycin and doxycycline do not typically work well for her She reports that symptoms did improve while taking antibiotics and prednisone, as soon as stopping antibiotics and prednisone symptoms came back with productive cough with yellow mucus Patient adherent to flutter valve use Patient adherent to all other medications  Plan: Levaquin 500 mg daily for 10 days Extended prednisone taper Continue flutter valve use Continue hypertonic saline Continue Symbicort 160 Continue Spiriva Respimat 1.25 Continue daily align probiotic 4-week follow-up with our office for telephonic outreach May need to consider therapy vest in the future Consider culturing sputum in the future at next in person office visit

## 2018-05-16 NOTE — Progress Notes (Signed)
Reviewed, agree 

## 2018-05-16 NOTE — Telephone Encounter (Signed)
Instructions from televisit in regards to pt's levaquin abx:     Levaquin 500mg  tablet >>> Take 1 500 mg tablet daily for the next 10 days >>> Take with food >>> start probiotic for good gut health >>>avoid rigorous exercise for next 2 weeks      Called and spoke with pt in regards to the levaquin and asked her if she had taken it with food. Pt stated that she did take it with food but forgot that she usually has to take zofran prior to taking levaquin due to it making her nauseous and she had forgotten that she usually has to do this with levaquin. Pt stated she will closely monitor herself with the med over the next couple days and stated if she still had problems that she would call us back. Nothing further needed.

## 2018-05-16 NOTE — Patient Instructions (Addendum)
Levaquin 500mg  tablet >>> Take 1 500 mg tablet daily for the next 10 days >>> Take with food >>> start probiotic for good gut health >>>avoid rigorous exercise for next 2 weeks   Prednisone 10mg  tablets  >>>4 tabs for 3 days, then 3 tabs for 3 days, 2 tabs for 3 days, then 1 tab for 3 days, then stop  Bronchiectasis: This is the medical term which indicates that you have damage, dilated airways making you more susceptible to respiratory infection. Use a flutter valve 10 breaths twice a day or 4 to 5 breaths 4-5 times a day to help clear mucus out Let us know if you have cough with change in mucus color or fevers or chills.  At that point you would need an antibiotic. Maintain a healthy nutritious diet, eating whole foods Take your medications as prescribed   Can start Mucinex 600mg  daily   Continue Hypertonic saline as ordered  Continue allegra daily  Continue Singulair daily  Continue Flonase   Continue Symbicort 160 >>> 2 puffs in the morning right when you wake up, rinse out your mouth after use, 12 hours later 2 puffs, rinse after use >>> Take this daily, no matter what >>> This is not a rescue inhaler   Continue Spiriva Respimat 1.25 >>> 2 puffs daily >>> Do this every day >>>This is not a rescue inhaler  May need to consider sputum culture if symptoms persist  May need to consider therapy vest in the future   Return in about 4 weeks (around 06/13/2018), or if symptoms worsen or fail to improve, for Follow up with [name] FNP-C.  Coronavirus (COVID-19) Are you at risk?  Are you at risk for the Coronavirus (COVID-19)?  To be considered HIGH RISK for Coronavirus (COVID-19), you have to meet the following criteria:  . Traveled to [place], [place], [place], [place] or [place]; or in the [place] to [place], [place], [place], or [place]; and have fever, cough, and shortness of breath within the last 2 weeks of travel OR . Been in close contact with a person  diagnosed with COVID-19 within the last 2 weeks and have fever, cough, and shortness of breath . IF YOU DO NOT MEET THESE CRITERIA, YOU ARE CONSIDERED LOW RISK FOR COVID-19.  What to do if you are HIGH RISK for COVID-19?  Marland Kitchen If you are having a medical emergency, call 911. . Seek medical care right away. Before you go to a doctor's office, urgent care or emergency department, call ahead and tell them about your recent travel, contact with someone diagnosed with COVID-19, and your symptoms. You should receive instructions from your physician's office regarding next steps of care.  . When you arrive at healthcare provider, tell the healthcare staff immediately you have returned from visiting [place], [place], [place], [place] or [place]; or traveled in the [place] to [place], [place], [place], or [place]; in the last two weeks or you have been in close contact with a person diagnosed with COVID-19 in the last 2 weeks.   . Tell the health care staff about your symptoms: fever, cough and shortness of breath. . After you have been seen by a medical provider, you will be either: o Tested for (COVID-19) and discharged home on quarantine except to seek medical care if symptoms worsen, and asked to  - Stay home and avoid contact with others until you get your results (4-5 days)  - Avoid travel on public  transportation if possible (such as bus, train, or airplane) or o Sent to the Emergency Department by EMS for evaluation, COVID-19 testing, and possible admission depending on your condition and test results.  What to do if you are LOW RISK for COVID-19?  Reduce your risk of any infection by using the same precautions used for avoiding the common cold or flu:  Marland Kitchen. Wash your hands often with soap and warm water for at least 20 seconds.  If soap and water are not readily available, use an alcohol-based hand sanitizer with at least 60% alcohol.  . If coughing or sneezing, cover your mouth and nose by  coughing or sneezing into the elbow areas of your shirt or coat, into a tissue or into your sleeve (not your hands). . Avoid shaking hands with others and consider head nods or verbal greetings only. . Avoid touching your eyes, nose, or mouth with unwashed hands.  . Avoid close contact with people who are sick. . Avoid places or events with large numbers of people in one location, like concerts or sporting events. . Carefully consider travel plans you have or are making. . If you are planning any travel outside or inside the [place], visit the CDC's Travelers' Health webpage for the latest health notices. . If you have some symptoms but not all symptoms, continue to monitor at home and seek medical attention if your symptoms worsen. . If you are having a medical emergency, call 911.   ADDITIONAL HEALTHCARE OPTIONS FOR PATIENTS  [facility] / e-Visit: [email or web address]         [facility]: [phone]  [facility]: [phone]                   [facility]: [phone]           It is flu season:   >>> Best ways to protect herself from the flu: Receive the yearly flu vaccine, practice good hand hygiene washing with soap and also using hand sanitizer when available, eat a nutritious meals, get adequate rest, hydrate appropriately   Please contact the office if your symptoms worsen or you have concerns that you are not improving.   Thank you for choosing [facility] for your healthcare, and for allowing us to partner with you on your healthcare journey. I am thankful to be able to provide care to you today.   Elisha HeadlandBrian Sayeed Weatherall FNP-C    Bronchiectasis  Bronchiectasis is a condition in which the airways in the lungs (bronchi) are damaged and widened. The condition makes it hard for the lungs to get rid of mucus, and it causes mucus to gather in the bronchi. This condition often leads  to lung infections, which can make the condition worse. What are the causes? You can be born with this condition or you can develop it later in life. Common causes of this condition include:  Cystic fibrosis.  Repeated lung infections, such as pneumonia or tuberculosis.  An object or other blockage in the lungs.  Breathing in fluid, food, or other objects (aspiration).  A problem with the immune system and lung structure that is present at birth (congenital). Sometimes the cause is not known. What are the signs or symptoms? Common symptoms of this condition include:  A daily cough that brings up mucus and lasts for more than 3 weeks.  Lung infections that happen often.  Shortness of breath and wheezing.  Weakness and fatigue. How is  this diagnosed? This condition is diagnosed with tests, such as:  Chest X-rays or CT scans. These are done to check for changes in the lungs.  Breathing tests. These are done to check how well your lungs are working.  A test of a sample of your saliva (sputum culture). This test is done to check for infection.  Blood tests and other tests. These are done to check for related diseases or causes. How is this treated? Treatment for this condition depends on the severity of the illness and its cause. Treatment may include:  Medicines that loosen mucus so it can be coughed up (expectorants).  Medicines that relax the muscles of the bronchi (bronchodilators).  Antibiotic medicines to prevent or treat infection.  Physical therapy to help clear mucus from the lungs. Techniques may include: ? Postural drainage. This is when you sit or lie in certain positions so that mucus can drain by gravity. ? Chest percussion. This involves tapping the chest or back with a cupped hand. ? Chest vibration. For this therapy, a hand or special equipment vibrates your chest and back.  Surgery to remove the affected part of the lung. This may be done in severe  cases. Follow these instructions at home: Medicines  Take over-the-counter and prescription medicines only as told by your health care provider.  If you were prescribed an antibiotic medicine, take it as told by your health care provider. Do not stop taking the antibiotic even if you start to feel better.  Avoid taking sedatives and antihistamines unless your health care provider tells you to take them. These medicines tend to thicken the mucus in the lungs. Managing symptoms  Perform breathing exercises or techniques to clear your lungs as told by your health care provider.  Consider using a cold steam vaporizer or humidifier in your room or home to help loosen secretions.  If you have a cough that gets worse at night, try sleeping in a semi-upright position. General instructions  Get plenty of rest.  Drink enough fluid to keep your urine clear or pale yellow.  Stay inside when pollution and ozone levels are high.  Stay up to date with vaccinations and immunizations.  Avoid cigarette smoke and other lung irritants.  Do not use any products that contain nicotine or tobacco, such as cigarettes and e-cigarettes. If you need help quitting, ask your health care provider.  Keep all follow-up visits as told by your health care provider. This is important. Contact a health care provider if:  You cough up more sputum than before and the sputum is yellow or green in color.  You have a fever.  You cannot control your cough and are losing sleep. Get help right away if:  You cough up blood.  You have chest pain.  You have increasing shortness of breath.  You have pain that gets worse or is not controlled with medicines.  You have a fever and your symptoms suddenly get worse. Summary  Bronchiectasis is a condition in which the airways in the lungs (bronchi) are damaged and widened. The condition makes it hard for the lungs to get rid of mucus, and it causes mucus to gather in  the bronchi.  Treatment usually includes therapy to help clear mucus from the lungs.  Stay up to date with vaccinations and immunizations. This information is not intended to replace advice given to you by your health care provider. Make sure you discuss any questions you have with your health care provider. Document  Released: 11/20/2006 Document Revised: 02/28/2016 Document Reviewed: 02/28/2016 Elsevier Interactive Patient Education  2019 [employer].

## 2018-05-18 ENCOUNTER — Other Ambulatory Visit: Payer: Self-pay | Admitting: Internal Medicine

## 2018-05-28 ENCOUNTER — Ambulatory Visit: Payer: BC Managed Care – PPO | Admitting: Internal Medicine

## 2018-05-29 ENCOUNTER — Ambulatory Visit: Payer: BC Managed Care – PPO | Admitting: Internal Medicine

## 2018-05-29 ENCOUNTER — Encounter: Payer: Self-pay | Admitting: Pulmonary Disease

## 2018-05-29 ENCOUNTER — Telehealth: Payer: Self-pay | Admitting: Pulmonary Disease

## 2018-05-29 ENCOUNTER — Ambulatory Visit (INDEPENDENT_AMBULATORY_CARE_PROVIDER_SITE_OTHER): Payer: BC Managed Care – PPO | Admitting: Pulmonary Disease

## 2018-05-29 ENCOUNTER — Other Ambulatory Visit: Payer: Self-pay

## 2018-05-29 DIAGNOSIS — R110 Nausea: Secondary | ICD-10-CM | POA: Insufficient documentation

## 2018-05-29 DIAGNOSIS — Z7952 Long term (current) use of systemic steroids: Secondary | ICD-10-CM | POA: Insufficient documentation

## 2018-05-29 DIAGNOSIS — J471 Bronchiectasis with (acute) exacerbation: Secondary | ICD-10-CM | POA: Diagnosis not present

## 2018-05-29 DIAGNOSIS — R112 Nausea with vomiting, unspecified: Secondary | ICD-10-CM

## 2018-05-29 DIAGNOSIS — J45901 Unspecified asthma with (acute) exacerbation: Secondary | ICD-10-CM | POA: Diagnosis not present

## 2018-05-29 DIAGNOSIS — E119 Type 2 diabetes mellitus without complications: Secondary | ICD-10-CM | POA: Diagnosis not present

## 2018-05-29 DIAGNOSIS — R197 Diarrhea, unspecified: Secondary | ICD-10-CM

## 2018-05-29 MED ORDER — ONDANSETRON 8 MG PO TBDP: 8.0000 mg | ORAL_TABLET | Freq: Three times a day (TID) | ORAL | 0 refills | Status: DC | PRN

## 2018-05-29 MED ORDER — PREDNISONE 10 MG PO TABS: ORAL_TABLET | ORAL | 0 refills | Status: DC

## 2018-05-29 NOTE — Progress Notes (Signed)
Virtual Visit via Telephone Note  I connected with Karen Barnes on 05/29/18 at 10:30 AM EDT by telephone and verified that I am speaking with the correct person using two identifiers.   I discussed the limitations, risks, security and privacy concerns of performing an evaluation and management service by telephone and the availability of in person appointments. I also discussed with the patient that there may be a patient responsible charge related to this service. The patient expressed understanding and agreed to proceed.   History of Present Illness: 58 year old female never smoker, first seen in 2017 for asthma and a pulmonary nodule She has severe GERD and is followed by Karen Barnes.  She started on immunotherapy in 2017 She was born prematurely at 60 weeks.   Patient of Karen Barnes  Patient consented to consult via telephone: Yes People present and their role in pt care: Pt   Chief complaint: Cough and wheezing  58 year old female never smoker followed in our office for bronchiectasis as well as asthma.  Patient last completed a tele-visit with our office earlier this month where she was treated with Levaquin and prednisone.  This is following doxycycline and prednisone.  Patient reports that she continues to have ongoing cough and wheezing.  She reports that this wheezing is worsening when off of prednisone.  She reports that she has these issues every spring and fall with change of season.  And sometimes requires multiple rounds of antibiotics and steroids in order to get out of this.  Patient reports that symptoms do improve while she is taking medications but then they returned quickly once the prednisone the antibiotic stopped.  She continues to be afebrile, she is having a productive cough with baseline tan/gray mucus.  She is not having any body aches and chills.  Patient is using her rescue inhaler about 3 times weekly as well as her Karen Barnes nebs 2 times daily with her flutter valve  and hypertonic saline.  She reports no changes to any of her other medications.  She reports adherence to her Symbicort 160, Spiriva Respimat, Allegra, hypertonic saline nebs, Singulair, and Mucinex.  She denies any worsened or acute GERD symptoms.  She reports adherence to her flutter valve.  Patient having concerns that she is not mobilizing secretions well enough with her flutter valve.  Patient reports that blood sugars have been elevated due to recurrent steroid tapers.  She reports that her highest blood sugar has been around 280.  She is managed on metformin.  Chart review reveals October/2019 CBC with differential shows eosinophil count of 300.  Patient reports that in the past we have discussed starting biologic therapy on her but this is never materialized.  Last sputum culture was in October/2019 and was negative.  Patient also reporting that she is having some nausea with her blood sugars as well as with her prednisone/antibiotic use.  She has Zofran at home that she uses.  She reports that her insurance company is requiring a prior Auth before she can receive more Zofran.  Prior Auth number is: 1 [id number].    Observations/Objective:  Chest imaging November 07, 2017 high-resolution CT scan of the chest images : Mild cylindrical bronchiectasis particularly in the left lower lobe, some in the right lower lobe 10/18/2015 chest CT images  showing normal pulmonary parenchyma with the exception of a 3.4 mm nodule in the left lower lobe.  Pulmonary function test: October 2019 ratio 90%, FVC 2.56 L 76% predicted, total lung  capacity 4.32 L 84% predicted, DLCO 20.7 mL 84% predicted  Labs: October 2019 complement levels within normal limits, IgG subclasses all normal, alpha-1 antitrypsin level normal, CBC showed 300 absolute eosinophil count  11/03/2015 - Ige - 32  Heart catheterization: October 2019 right heart catheterization showed mean PA pressure 24, pulmonary capillary wedge pressure  19, PVR 0.7 Woods units   Lab Results  Component Value Date   NITRICOXIDE 22 11/02/2017    Assessment and Plan:  Bronchiectasis with acute exacerbation (HCC) Assessment: Recently treated with doxycycline, Levaquin, and 2 prednisone tapers Patient reporting adherence to flutter valve use as well as her other medications Patient reports that the sputum is exceptionally thick as well as gray and tan in color  Plan: Extended prednisone taper today Patient will contact primary care to notify them about the steroid taper as well as her blood sugars Hold off on antibiotic therapies right now as patient is afebrile and does not feel to be acutely worsening Order for respite tach therapy vest to help with mobilizing secretions chronically Continue hypertonic saline Continue Symbicort 160 Continue flutter valve use Continue Spiriva Respimat 1.25 Continue daily align 4-week follow-up their office for telephonic outreach We will hold off on culturing sputum as recent sputum culture in October/2019 was negative  Asthmatic bronchitis with acute exacerbation Assessment: Patient reports adherence to Symbicort 160 as well as Spiriva 1.25 Patient is having productive cough with tan to gray mucus Patient reporting increased wheezing and shortness of breath since finishing steroids October/2019 CBC with differential shows eosinophil count of 300  Plan: Prednisone taper today Patient to contact primary care regarding steroid taper and blood sugars Continue flutter valve Continue Symbicort 160 Continue Spiriva Respimat 1.25 4-week telephonic follow-up with Karen HeadlandBrian Carilyn Woolston, FNP Start paperwork for Dupixent biologic injections  DMII (diabetes mellitus, type 2) Assessment: Patient reports this was recently well controlled as well as with her A1c being around 6 Recurrent prednisone tapers patient now reporting that she is having elevated blood sugars sometimes up to 280  Plan: Contact primary  care regarding recent blood sugars Update primary care regarding prednisone tapers and issues with her breathing We will plan to try to transition to biologic therapy to help reduce corticosteroid use  Patient has has daily productive cough lasting greater than 12 months requiring antibiotic therapy.  Flutter valve has been tried, but has not effectively mobilized secretions.  Considered and ruled out manual CPT as no consistent skilled caregiver available to perform therapy.  CT scan performed on 11/07/2017 confirms bronchiectasis.  Starting patient on vest therapy.    Follow Up Instructions:  Return in about 4 weeks (around 06/26/2018), or if symptoms worsen or fail to improve, for Follow up with Karen HeadlandBrian Laketra Bowdish FNP-C, Follow up with Dr. Kendrick FriesMcQuaid.    I discussed the assessment and treatment plan with the patient. The patient was provided an opportunity to ask questions and all were answered. The patient agreed with the plan and demonstrated an understanding of the instructions.   The patient was advised to call back or seek an in-person evaluation if the symptoms worsen or if the condition fails to improve as anticipated.  I provided 30 minutes of non-face-to-face time during this encounter.   Coral CeoBrian P Maricel Swartzendruber, NP

## 2018-05-29 NOTE — Telephone Encounter (Signed)
The patient has needed it.  That is a 10-day prescription based off of the parameters provided.  Has been greater than 10 days since it has been filled.   Patient is under the impression after speaking with her insurance company that this can be approved via the insurance with [place] contacting them.  This is true?  Are there any other steps that we can take?  Karen Headland, FNP

## 2018-05-29 NOTE — Telephone Encounter (Signed)
Called [facility], [phone] for Zofran PA.  Zofran was last filled 05/14/18, #30, 4mg  every 8 hours as needed for nausea and vomiting, with 5 refills.  Patient can not receive until 06/04/18, due to previous order.   Message routed to [facility]

## 2018-05-29 NOTE — Patient Instructions (Addendum)
Prednisone 10mg  tablets  >>>4 tabs for 5 days, then 3 tabs for 5 days, 2 tabs for 5 days, then 1 tab for 5 days, then stop  Please contact PCP about elevated blood sugars with prednisone, especially with your extended taper   Start paperwork for Dupixent   Bronchiectasis: This is the medical term which indicates that you have damage, dilated airways making you more susceptible to respiratory infection.  Use a flutter valve 10 breaths twice a day or 4 to 5 breaths 4-5 times a day to help clear mucus out Let us know if you have cough with change in mucus color or fevers or chills.  At that point you would need an antibiotic. Maintain a healthy nutritious diet, eating whole foods Take your medications as prescribed   May need to consider sputum culture if symptoms  Order placed for Respitech therapy vest  Continue  Mucinex 600mg  daily   Continue Hypertonic saline as ordered  Continue allegra daily  Continue Singulair daily  Continue Flonase   Continue Symbicort 160 >>> 2 puffs in the morning right when you wake up, rinse out your mouth after use, 12 hours later 2 puffs, rinse after use >>> Take this daily, no matter what >>> This is not a rescue inhaler   Continue Spiriva Respimat 1.25 >>> 2 puffs daily >>> Do this every day >>>This is not a rescue inhaler     Return in about 4 weeks (around 06/26/2018), or if symptoms worsen or fail to improve, for Follow up with Elisha HeadlandBrian Carolyna Yerian FNP-C, Follow up with Dr. Kendrick FriesMcQuaid.   Coronavirus (COVID-19) Are you at risk?  Are you at risk for the Coronavirus (COVID-19)?  To be considered HIGH RISK for Coronavirus (COVID-19), you have to meet the following criteria:  . Traveled to [place], [place], [place], [place] or [place]; or in the [place] to [place], [place], [place], or [place]; and have fever, cough, and shortness of breath within the last 2 weeks of travel OR . Been in close contact with a person diagnosed with COVID-19  within the last 2 weeks and have fever, cough, and shortness of breath . IF YOU DO NOT MEET THESE CRITERIA, YOU ARE CONSIDERED LOW RISK FOR COVID-19.  What to do if you are HIGH RISK for COVID-19?  Marland Kitchen. If you are having a medical emergency, call 911. . Seek medical care right away. Before you go to a doctor's office, urgent care or emergency department, call ahead and tell them about your recent travel, contact with someone diagnosed with COVID-19, and your symptoms. You should receive instructions from your physician's office regarding next steps of care.  . When you arrive at healthcare provider, tell the healthcare staff immediately you have returned from visiting [place], [place], [place], [place] or [place]; or traveled in the [place] to [place], [place], [place], or [place]; in the last two weeks or you have been in close contact with a person diagnosed with COVID-19 in the last 2 weeks.   . Tell the health care staff about your symptoms: fever, cough and shortness of breath. . After you have been seen by a medical provider, you will be either: o Tested for (COVID-19) and discharged home on quarantine except to seek medical care if symptoms worsen, and asked to  - Stay home and avoid contact with others until you get your results (4-5 days)  - Avoid travel on public transportation if possible (such as bus, train, or  airplane) or o Sent to the Emergency Department by EMS for evaluation, COVID-19 testing, and possible admission depending on your condition and test results.  What to do if you are LOW RISK for COVID-19?  Reduce your risk of any infection by using the same precautions used for avoiding the common cold or flu:  Marland Kitchen Wash your hands often with soap and warm water for at least 20 seconds.  If soap and water are not readily available, use an alcohol-based hand sanitizer with at least 60% alcohol.  . If coughing or sneezing, cover your mouth and nose by coughing or sneezing  into the elbow areas of your shirt or coat, into a tissue or into your sleeve (not your hands). . Avoid shaking hands with others and consider head nods or verbal greetings only. . Avoid touching your eyes, nose, or mouth with unwashed hands.  . Avoid close contact with people who are sick. . Avoid places or events with large numbers of people in one location, like concerts or sporting events. . Carefully consider travel plans you have or are making. . If you are planning any travel outside or inside the [place], visit the CDC's Travelers' Health webpage for the latest health notices. . If you have some symptoms but not all symptoms, continue to monitor at home and seek medical attention if your symptoms worsen. . If you are having a medical emergency, call 911.   ADDITIONAL HEALTHCARE OPTIONS FOR PATIENTS  [facility] / e-Visit: [email or web address]         [facility]: [phone]  [facility]: [phone]                   [facility]: [phone]           It is flu season:   >>> Best ways to protect herself from the flu: Receive the yearly flu vaccine, practice good hand hygiene washing with soap and also using hand sanitizer when available, eat a nutritious meals, get adequate rest, hydrate appropriately   Please contact the office if your symptoms worsen or you have concerns that you are not improving.   Thank you for choosing [facility] for your healthcare, and for allowing [place] to partner with you on your healthcare journey. I am thankful to be able to provide care to you today.   [name] FNP-C     Bronchiectasis  Bronchiectasis is a condition in which the airways in the lungs (bronchi) are damaged and widened. The condition makes it hard for the lungs to get rid of mucus, and it causes mucus to gather in the bronchi. This condition often leads to lung infections,  which can make the condition worse. What are the causes? You can be born with this condition or you can develop it later in life. Common causes of this condition include:  Cystic fibrosis.  Repeated lung infections, such as pneumonia or tuberculosis.  An object or other blockage in the lungs.  Breathing in fluid, food, or other objects (aspiration).  A problem with the immune system and lung structure that is present at birth (congenital). Sometimes the cause is not known. What are the signs or symptoms? Common symptoms of this condition include:  A daily cough that brings up mucus and lasts for more than 3 weeks.  Lung infections that happen often.  Shortness of breath and wheezing.  Weakness and fatigue. How is this diagnosed? This condition is diagnosed with  tests, such as:  Chest X-rays or CT scans. These are done to check for changes in the lungs.  Breathing tests. These are done to check how well your lungs are working.  A test of a sample of your saliva (sputum culture). This test is done to check for infection.  Blood tests and other tests. These are done to check for related diseases or causes. How is this treated? Treatment for this condition depends on the severity of the illness and its cause. Treatment may include:  Medicines that loosen mucus so it can be coughed up (expectorants).  Medicines that relax the muscles of the bronchi (bronchodilators).  Antibiotic medicines to prevent or treat infection.  Physical therapy to help clear mucus from the lungs. Techniques may include: ? Postural drainage. This is when you sit or lie in certain positions so that mucus can drain by gravity. ? Chest percussion. This involves tapping the chest or back with a cupped hand. ? Chest vibration. For this therapy, a hand or special equipment vibrates your chest and back.  Surgery to remove the affected part of the lung. This may be done in severe cases. Follow these  instructions at home: Medicines  Take over-the-counter and prescription medicines only as told by your health care provider.  If you were prescribed an antibiotic medicine, take it as told by your health care provider. Do not stop taking the antibiotic even if you start to feel better.  Avoid taking sedatives and antihistamines unless your health care provider tells you to take them. These medicines tend to thicken the mucus in the lungs. Managing symptoms  Perform breathing exercises or techniques to clear your lungs as told by your health care provider.  Consider using a cold steam vaporizer or humidifier in your room or home to help loosen secretions.  If you have a cough that gets worse at night, try sleeping in a semi-upright position. General instructions  Get plenty of rest.  Drink enough fluid to keep your urine clear or pale yellow.  Stay inside when pollution and ozone levels are high.  Stay up to date with vaccinations and immunizations.  Avoid cigarette smoke and other lung irritants.  Do not use any products that contain nicotine or tobacco, such as cigarettes and e-cigarettes. If you need help quitting, ask your health care provider.  Keep all follow-up visits as told by your health care provider. This is important. Contact a health care provider if:  You cough up more sputum than before and the sputum is yellow or green in color.  You have a fever.  You cannot control your cough and are losing sleep. Get help right away if:  You cough up blood.  You have chest pain.  You have increasing shortness of breath.  You have pain that gets worse or is not controlled with medicines.  You have a fever and your symptoms suddenly get worse. Summary  Bronchiectasis is a condition in which the airways in the lungs (bronchi) are damaged and widened. The condition makes it hard for the lungs to get rid of mucus, and it causes mucus to gather in the  bronchi.  Treatment usually includes therapy to help clear mucus from the lungs.  Stay up to date with vaccinations and immunizations. This information is not intended to replace advice given to you by your health care provider. Make sure you discuss any questions you have with your health care provider. Document Released: 11/20/2006 Document Revised: 02/28/2016 Document Reviewed:  02/28/2016 Elsevier Interactive Patient Education  [employer].

## 2018-05-29 NOTE — Assessment & Plan Note (Signed)
Assessment: Recently treated with doxycycline, Levaquin, and 2 prednisone tapers Patient reporting adherence to flutter valve use as well as her other medications Patient reports that the sputum is exceptionally thick as well as gray and tan in color  Plan: Extended prednisone taper today Patient will contact primary care to notify them about the steroid taper as well as her blood sugars Hold off on antibiotic therapies right now as patient is afebrile and does not feel to be acutely worsening Order for respite tach therapy vest to help with mobilizing secretions chronically Continue hypertonic saline Continue Symbicort 160 Continue flutter valve use Continue Spiriva Respimat 1.25 Continue daily align 4-week follow-up their office for telephonic outreach We will hold off on culturing sputum as recent sputum culture in October/2019 was negative

## 2018-05-29 NOTE — Assessment & Plan Note (Signed)
Assessment: Patient reports this was recently well controlled as well as with her A1c being around 6 Recurrent prednisone tapers patient now reporting that she is having elevated blood sugars sometimes up to 280  Plan: Contact primary care regarding recent blood sugars Update primary care regarding prednisone tapers and issues with her breathing We will plan to try to transition to biologic therapy to help reduce corticosteroid use

## 2018-05-29 NOTE — Telephone Encounter (Signed)
Called [facility], [phone], spoke with Malachi Bonds, [place] representative.  Zofran 8 mg,take 1 tab every 8 hours as needed for nausea and vomiting,  #20 PA initiated.  PA sent to pharmacy clinical review. Fax for approval or denial will be received in 24 business days. Reference # PA [id number].

## 2018-05-29 NOTE — Assessment & Plan Note (Signed)
Assessment: Patient reports adherence to Symbicort 160 as well as Spiriva 1.25 Patient is having productive cough with tan to gray mucus Patient reporting increased wheezing and shortness of breath since finishing steroids October/2019 CBC with differential shows eosinophil count of 300  Plan: Prednisone taper today Patient to contact primary care regarding steroid taper and blood sugars Continue flutter valve Continue Symbicort 160 Continue Spiriva Respimat 1.25 4-week telephonic follow-up with [name], FNP Start paperwork for Dupixent biologic injections

## 2018-05-29 NOTE — Telephone Encounter (Signed)
Instructions from televisit today 4/22 with [name], NP   Return in about 4 weeks (around 06/26/2018), or if symptoms worsen or fail to improve, for Follow up with [name] FNP-C, Follow up with Dr. Kendrick Fries.  Prednisone 10mg  tablets  >>>4 tabs for 5 days, then 3 tabs for 5 days, 2 tabs for 5 days, then 1 tab for 5 days, then stop     Called pt's pharmacy and spoke with Earley Abide letting her know that the instructions for pt's prednisone Rx is correct as sent in but the quantity is what was wrong. Stated to [place] that the quantity for prednisone should be 50 tabs. Hilda expressed understanding and stated they would get this taken care of for pt. Nothing further needed.

## 2018-05-29 NOTE — Progress Notes (Signed)
Virtual Visit via Video   I connected with Karen Barnes on 05/30/18 at 10:40 AM EDT by a video enabled telemedicine application and verified that I am speaking with the correct person using two identifiers. Location patient: Home Location provider: [facility], Office Persons participating in the virtual visit: Karen Barnes, Karen Diebold, MD   I discussed the limitations of evaluation and management by telemedicine and the availability of in person appointments. The patient expressed understanding and agreed to proceed.  Subjective:   HPI: Pulmonary follow up-  Recently diagnosed with bronchiectasis and also has asthma.  She is followed by Dr. Kendrick FriesMcQuaid. Evaluated Karen NapoleonbyBrian Mack, NP with pulmonary via telephone visit on 05/29/18.  Note reviewed.  Was evaluated by televisit by pulmonary earlier this month and was treated with Levaquin and prednisone.  This is following doxycycline and prednisone.  Patient reports that she continues to have ongoing cough and wheezing.  She reports that this wheezing is worsening when off of prednisone.  She reports that she has these issues every spring and fall with change of season.  And sometimes requires multiple rounds of antibiotics and steroids in order to get out of this.  Patient reports that symptoms do improve while she is taking medications but then they returned quickly once the prednisone the antibiotic stopped.  She continues to be afebrile, she is having a productive cough with baseline tan/gray mucus.  She is not having any body aches and chills.  Patient is using her rescue inhaler about 3 times weekly as well as her Karen Barnes nebs 2 times daily with her flutter valve and hypertonic saline.  She reports no changes to any of her other medications.  She reports adherence to her Symbicort 160, Spiriva Respimat, Allegra, hypertonic saline nebs, Singulair, and Mucinex.  She denies any worsened or acute GERD symptoms.  She reports adherence to her flutter  valve.   A/P per pulmonary was as follows:  Bronchiectasis with acute exacerbation (HCC) Assessment: Recently treated with doxycycline, Levaquin, and 2 prednisone tapers Patient reporting adherence to flutter valve use as well as her other medications Patient reports that the sputum is exceptionally thick as well as gray and tan in color  Plan: Extended prednisone taper today Patient will contact primary care to notify them about the steroid taper as well as her blood sugars Hold off on antibiotic therapies right now as patient is afebrile and does not feel to be acutely worsening Order for respite tach therapy vest to help with mobilizing secretions chronically Continue hypertonic saline Continue Symbicort 160 Continue flutter valve use Continue Spiriva Respimat 1.25 Continue daily align 4-week follow-up their office for telephonic outreach We will hold off on culturing sputum as recent sputum culture in October/2019 was negative  Asthmatic bronchitis with acute exacerbation Assessment: Patient reports adherence to Symbicort 160 as well as Spiriva 1.25 Patient is having productive cough with tan to gray mucus Patient reporting increased wheezing and shortness of breath since finishing steroids October/2019 CBC with differential shows eosinophil count of 300  Plan: Prednisone taper today Patient to contact primary care regarding steroid taper and blood sugars Continue flutter valve Continue Symbicort 160 Continue Spiriva Respimat 1.25 4-week telephonic follow-up with Karen HeadlandBrian Mack, FNP Start paperwork for Dupixent biologic injections  DMII (diabetes mellitus, type 2) Assessment: Patient reports this was recently well controlled as well as with her A1c being around 6 Recurrent prednisone tapers patient now reporting that she is having elevated blood sugars sometimes up to 280  Plan: Contact primary care regarding recent blood sugars Update primary care regarding  prednisone tapers and issues with her breathing We will plan to try to transition to biologic therapy to help reduce corticosteroid use  Patient has has daily productive cough lasting greater than 12 months requiring antibiotic therapy.  Flutter valve has been tried, but has not effectively mobilized secretions.  Considered and ruled out manual CPT as no consistent skilled caregiver available to perform therapy.  CT scan performed on 11/07/2017 confirms bronchiectasis.  Starting patient on vest therapy.    Follow Up Instructions:  Return in about 4 weeks (around 06/26/2018), or if symptoms worsen or fail to improve, for Follow up with Karen Headland FNP-C, Follow up with Dr. Kendrick Barnes.   DM- Currently taking Metformin 1000 mg twice daily.  Only other diabetes medication she has ever taken was Januvia.  She did not have issues with it. Lab Results  Component Value Date   HGBA1C 6.4 (A) 03/25/2018   HGBA1C 6.4 03/25/2018   Patient reports that blood sugars have been elevated due to recurrent steroid tapers.  She reports that her highest blood sugar has been around 280, fasting ranging from 117-280.  Denies any symptoms of hypoglycemia.  Patient also reporting that she is having some nausea with her blood sugars as well as with her prednisone/antibiotic use.  She has Zofran at home that she uses.    She reports that her insurance company is requiring a prior Auth before she can receive more Zofran.  Prior Auth number is: 1 [id number].  Review of Systems  HENT: Negative.   Respiratory: Positive for cough, shortness of breath and wheezing. Negative for stridor.   Cardiovascular: Negative.   Gastrointestinal: Positive for nausea.  Neurological: Negative.   Hematological: Negative.   Psychiatric/Behavioral: Negative.   All other systems reviewed and are negative.   ROS: See pertinent positives and negatives per HPI.  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   OSA (obstructive sleep apnea) 02/03/2016   Lung nodule 11/03/2015   PCP NOTES >>>>>>>>>>>>>>>>>>>>>>>>>>>>>>>>>>> 03/26/2015   Family history of breast cancer 02/23/2012   At risk for coronary artery disease 02/07/2012   Obesity 12/13/2010   Heart palpitations 12/13/2010   Elevated blood pressure reading 12/13/2010   Hyperlipidemia 03/07/2007   PAC 03/07/2007   CYSTITIS, RECURRENT 03/07/2007   DMII (diabetes mellitus, type 2) (HCC) 03/07/2007   Asthmatic bronchitis with acute exacerbation 07/12/2006   GERD 07/12/2006    Social History   Tobacco Use   Smoking status: Never Smoker   Smokeless tobacco: Never Used  Substance Use Topics   Alcohol use: No    Current Outpatient Medications:    albuterol (PROVENTIL HFA;VENTOLIN HFA) 108 (90 BASE) MCG/ACT inhaler, Inhale 2 puffs into the lungs every 6 (six) hours as needed., Disp: , Rfl:    albuterol (PROVENTIL) (2.5 MG/3ML) 0.083% nebulizer solution, Take 2.5 mg by nebulization every 6 (six) hours as needed for wheezing or shortness of breath., Disp: , Rfl:    aspirin EC 81 MG tablet, Take 81 mg by mouth daily., Disp: , Rfl:    bifidobacterium infantis (ALIGN) capsule, Take 1 capsule by mouth daily., Disp: , Rfl:    budesonide-formoterol (SYMBICORT) 160-4.5 MCG/ACT inhaler,  Inhale 2 puffs into the lungs 2 (two) times daily., Disp: , Rfl:    Calcium Carbonate-Vitamin D (CALCIUM 600+D) 600-200 MG-UNIT TABS, Take 1 tablet by mouth daily., Disp: , Rfl:    Continuous Blood Gluc Receiver (FREESTYLE LIBRE 14 DAY READER) DEVI, USE AS DIRECTED, Disp: 1 Device, Rfl: 0   Continuous Blood Gluc Sensor (FREESTYLE LIBRE 14 DAY SENSOR) MISC, USE AS DIRECTED PER MD, Disp: 2 each, Rfl:  5   Continuous Blood Gluc Sensor (FREESTYLE LIBRE SENSOR SYSTEM) MISC, CHECK BLOOD SUGAR DAILY AS DIRECTED. CHANGE DEVICE EVERY 10 DAYS, Disp: 3 each, Rfl: 12   diltiazem (CARDIZEM) 30 MG tablet, Take 1 tablet (30 mg total) by mouth every 6 (six) hours as needed (for palpitations)., Disp: 30 tablet, Rfl: 4   EPINEPHrine (AUVI-Q) 0.3 mg/0.3 mL IJ SOAJ injection, Inject 0.3 mg into the muscle as directed. For immunotherapy (allergy shots) anaphylactic reactions, Disp: , Rfl:    fexofenadine (ALLEGRA) 180 MG tablet, Take 180 mg by mouth every morning. , Disp: , Rfl:    fluticasone (FLONASE) 50 MCG/ACT nasal spray, Place 2 sprays into both nostrils 2 (two) times daily. , Disp: , Rfl:    furosemide (LASIX) 20 MG tablet, TAKE 1 TABLET(20 MG) BY MOUTH DAILY AS NEEDED FOR FLUID RETENTION OR SWELLING, Disp: 30 tablet, Rfl: 8   hydrocortisone 2.5 % cream, APPLY EXTERNALLY TO THE AFFECTED AREA TWICE DAILY AS NEEDED (Patient taking differently: Apply 1 application topically 2 (two) times daily as needed (for skin irritation/rash). ), Disp: 30 g, Rfl: 0   levofloxacin (LEVAQUIN) 500 MG tablet, Take 1 tablet (500 mg total) by mouth daily., Disp: 10 tablet, Rfl: 0   metFORMIN (GLUCOPHAGE) 1000 MG tablet, Take 1 tablet by mouth 2 (two) times daily., Disp: , Rfl:    montelukast (SINGULAIR) 10 MG tablet, Take 10 mg by mouth at bedtime.  , Disp: , Rfl:    Multiple Vitamins-Minerals (MULTIVITAMIN WITH MINERALS) tablet, Take 1 tablet by mouth daily., Disp: , Rfl:    nitrofurantoin, macrocrystal-monohydrate, (MACROBID) 100 MG capsule, TAKE AS DIRECTED POST COITAL (Patient taking differently: Take 100 mg by mouth daily as needed (post coital). ), Disp: 30 capsule, Rfl: 0   omeprazole (PRILOSEC) 40 MG capsule, Take 40 mg by mouth at bedtime., Disp: , Rfl:    ondansetron (ZOFRAN ODT) 8 MG disintegrating tablet, Take 1 tablet (8 mg total) by mouth every 8 (eight) hours as needed for nausea or vomiting., Disp: 20  tablet, Rfl: 0   ondansetron (ZOFRAN-ODT) 4 MG disintegrating tablet, DISSOLVE 1 TABLET BY MOUTH EVERY 8 HOURS AS NEEDED FOR NAUSEA OR VOMITING, Disp: 30 tablet, Rfl: 5   pantoprazole (PROTONIX) 40 MG tablet, TAKE 1 TABLET(40 MG) BY MOUTH DAILY, Disp: 90 tablet, Rfl: 3   predniSONE (DELTASONE) 10 MG tablet, 4 tabs for 5 days, then 3 tabs for 5 days, 2 tabs for 5 days, then 1 tab for 5 days, then stop, Disp: 20 tablet, Rfl: 0   PRESCRIPTION MEDICATION, Allergy Shots, Disp: , Rfl:    rosuvastatin (CRESTOR) 5 MG tablet, TAKE 1 TABLET(5 MG) BY MOUTH DAILY, Disp: 90 tablet, Rfl: 3   sodium chloride HYPERTONIC 3 % nebulizer solution, Take by nebulization 2 (two) times daily., Disp: 300 mL, Rfl: 11   SPIRIVA RESPIMAT 1.25 MCG/ACT AERS, INHALE 2 PUFF BY MOUTH EVERY DAY, Disp: 4 g, Rfl: 11  Allergies  Allergen Reactions   Butorphanol Tartrate Other (See Comments)    REACTION: hallucinations   Hydrocodone Other (  See Comments)    HALLUCINATIONS   Roxicet [Oxycodone-Acetaminophen] Other (See Comments)    HALLUCINATIONS   Penicillins Hives    Occurred at age 33/CHILDHOOD ALLERGY Has patient had a PCN reaction causing immediate rash, facial/tongue/throat swelling, SOB or lightheadedness with hypotension: Unknown Has patient had a PCN reaction causing severe rash involving mucus membranes or skin necrosis: Unknown Has patient had a PCN reaction that required hospitalization: Unknown Has patient had a PCN reaction occurring within the last 10 years: No If all of the above answers are "NO", then may proceed with Cephalosporin use.     Objective:  BP 123/72 Comment: Pt took this am @ hm   Pulse 78    Temp (!) 97.1 F (36.2 C) (Temporal) Comment: Pt took @ hm   Ht  (1.626 m)    Wt 178 lb (80.7 kg) Comment: Wt this am    BMI 30.55 kg/m   VITALS: Per patient if applicable, see vitals. GENERAL: Alert, appears well and in no acute distress. HEENT: Atraumatic, conjunctiva clear, no  obvious abnormalities on inspection of external nose and ears. NECK: Normal movements of the head and neck. CARDIOPULMONARY: No increased WOB. Speaking in clear sentences. I:E ratio WNL.  MS: Moves all visible extremities without noticeable abnormality. PSYCH: Pleasant and cooperative, well-groomed. Speech normal rate and rhythm. Affect is appropriate. Insight and judgement are appropriate. Attention is focused, linear, and appropriate.  NEURO: CN grossly intact. Oriented as arrived to appointment on time with no prompting. Moves both UE equally.  SKIN: No obvious lesions, wounds, erythema, or cyanosis noted on face or hands.  Assessment and Plan:   Kindal was seen today for follow-up.  Diagnoses and all orders for this visit:  Bronchiectasis with acute exacerbation (HCC)  Asthmatic bronchitis with acute exacerbation, unspecified asthma severity, unspecified whether persistent  Type 2 diabetes mellitus without complication, unspecified whether long term insulin use (HCC)  On corticosteroid therapy  Nausea     Reviewed expectations re: course of current medical issues.  Discussed self-management of symptoms.  Outlined signs and symptoms indicating need for more acute intervention.  Patient verbalized understanding and all questions were answered.  Health Maintenance issues including appropriate healthy diet, exercise, and smoking avoidance were discussed with patient.  See orders for this visit as documented in the electronic medical record.  Ruthe Mannan, MD 05/30/2018

## 2018-05-30 ENCOUNTER — Ambulatory Visit (INDEPENDENT_AMBULATORY_CARE_PROVIDER_SITE_OTHER): Payer: BC Managed Care – PPO | Admitting: Family Medicine

## 2018-05-30 ENCOUNTER — Encounter: Payer: Self-pay | Admitting: Family Medicine

## 2018-05-30 VITALS — BP 123/72 | HR 78 | Temp 97.1°F | Ht 64.0 in | Wt 178.0 lb

## 2018-05-30 DIAGNOSIS — E119 Type 2 diabetes mellitus without complications: Secondary | ICD-10-CM

## 2018-05-30 DIAGNOSIS — J471 Bronchiectasis with (acute) exacerbation: Secondary | ICD-10-CM

## 2018-05-30 DIAGNOSIS — J45901 Unspecified asthma with (acute) exacerbation: Secondary | ICD-10-CM | POA: Diagnosis not present

## 2018-05-30 DIAGNOSIS — Z7952 Long term (current) use of systemic steroids: Secondary | ICD-10-CM

## 2018-05-30 DIAGNOSIS — R110 Nausea: Secondary | ICD-10-CM

## 2018-05-30 MED ORDER — SITAGLIPTIN PHOSPHATE 100 MG PO TABS: 100.0000 mg | ORAL_TABLET | Freq: Every day | ORAL | 1 refills | Status: DC

## 2018-05-30 NOTE — Assessment & Plan Note (Signed)
>  25 minutes spent in face to face time with patient, >50% spent in counselling or coordination of care discussing DM and bronchiectasis.  Since it appears that she will be on prednisone longer than expected, will restart Januvia 100 mg daily ( she was on this on this past) to her current dose of Metformin 1000 mg twice daily. She will update me with her blood sugars- she scans libre at 4-6 times per day. She is looking forward to therapy vest that is likely getting for her bronchiectasis as flutter valve has not given her the relief they had expected.

## 2018-05-30 NOTE — Telephone Encounter (Signed)
PA was denied for Zofran because she must have hyperemesis gravidarum, undergoing radiation therapy, or highly emetogenic chemotherapy. Karen Barnes did you want to change medication? They did not list what was covered.

## 2018-05-30 NOTE — Telephone Encounter (Signed)
Called and spoke with pt letting her know the PA for zofran was denied and stated to her that [name] said we could send in Rx for phenergan if she would like [place] to but stated to her that it could be sedating. Pt stated that she has not had any problems with nausea so far with the nausea. Pt said that she still has some zofran left over from a previous Rx but stated if she did run out of the zofran but states if she needed to she already had an Rx of phenergan that she would take if she needed to. Nothing further needed.

## 2018-05-30 NOTE — Telephone Encounter (Signed)
Please contact the patient and let her know that the Zofran has been denied.  Please let her know that we have tried multiple times to submit a prior Auth and her insurance is not allowing [place].  I can offer the patient Phenergan for her nausea if she would like.  This is sedating though.  Phenergan 25 mg tablet >>>Can take 0.5 to 1 tablet every 8 hours as needed for nausea >>> 30 tablets, 0 refills  [name] FNP

## 2018-05-30 NOTE — Progress Notes (Signed)
Reviewed, agree 

## 2018-06-04 ENCOUNTER — Telehealth: Payer: Self-pay | Admitting: Pulmonary Disease

## 2018-06-04 NOTE — Telephone Encounter (Signed)
Called and spoke with Lajean Manes, [phone] Susy Frizzle was at lunch).  Lyda Jester stated code was needed for vest.  ICD codes J45.901 and J47.9.  Lyda Jester stated in notes CPT code was needed.  Lyda Jester made note of call back number and I explained I was going to send a message to [facility] to see if they can help.  Lyda Jester stated understanding.  Message routed to [facility] to advise

## 2018-06-04 NOTE — Telephone Encounter (Signed)
Order was sent to [employer].  I called Maralyn Sago and she is going to speak to her billing dept and have them to call [employer].  Nothing further needed from [place].

## 2018-06-04 NOTE — Telephone Encounter (Signed)
Patient returned call about therapy vest.  Patient wanted to know cost.  Instructed Patient to contact insurance company, because we do not handle the cost.  Patient wanted to know how long it takes to receive.  Explained that once order is placed, sent to [employer], someone from [employer] should contact her reguarding insurance approval, receiving vest, and cost.  Patient stated understanding. Nothing further at this time.

## 2018-06-04 NOTE — Telephone Encounter (Signed)
Karen Barnes stated please call the provider line [phone].

## 2018-06-04 NOTE — Telephone Encounter (Signed)
ATC Patient.  Left message for Patient to call back. 

## 2018-06-06 ENCOUNTER — Telehealth: Payer: Self-pay | Admitting: Pulmonary Disease

## 2018-06-06 NOTE — Telephone Encounter (Signed)
Dupixent enrollment forms were brought to me by Maryfrances Bunnell, RN. These have been filled out to completion and faxed to [employer]. Will await summary of benefits.

## 2018-06-07 ENCOUNTER — Encounter: Payer: Self-pay | Admitting: Family Medicine

## 2018-06-10 NOTE — Telephone Encounter (Signed)
Received summary of benefits. PA will need to be completed through [facility]. [facility] will be the dispensing pharmacy. PA has been started on Cover My Meds. Key: [id number].

## 2018-06-11 ENCOUNTER — Telehealth: Payer: Self-pay | Admitting: Pulmonary Disease

## 2018-06-11 NOTE — Telephone Encounter (Signed)
Will close this message since it is in regards to patient's Dupxient and it is already being taken care of.

## 2018-06-11 NOTE — Telephone Encounter (Signed)
Checked Cover My Meds. PA is still pending at this time. 

## 2018-06-13 ENCOUNTER — Telehealth: Payer: Self-pay

## 2018-06-13 ENCOUNTER — Ambulatory Visit: Payer: BC Managed Care – PPO | Admitting: Pulmonary Disease

## 2018-06-13 NOTE — Telephone Encounter (Signed)
Checked Cover My Meds. PA still does not have a determination. Called [facility] at [phone] to follow up on this PA. Spoke with [employer]. She states that it looks like the PA on Cover My Meds was not completed on their end. PA has now been completed over the phone. Trula Ore requested clinical notes and lab results be faxed to [phone] Attn: [phone]. This has been done. Will await PA decision.

## 2018-06-13 NOTE — Telephone Encounter (Signed)
Copied from CRM [id number]. Topic: General - Other >> Jun 13, 2018  8:37 AM [name] wrote: Reason for CRM: Patient called regarding patient message from 5/1. She states that she was calling to report blood sugar levels, as advised, and received a message back about scheduling an appointment. Patient would like to know if an appointment is necessary.

## 2018-06-14 ENCOUNTER — Telehealth: Payer: Self-pay | Admitting: Pulmonary Disease

## 2018-06-14 NOTE — Telephone Encounter (Signed)
She can just report them.

## 2018-06-14 NOTE — Telephone Encounter (Signed)
Called spoke with patient. Patient states she has had the vest since Monday 06/10/18 and is using it. I let her know we would put in a new order for the vest as a trial and appeal the insurance denial for the vest. Patient verbalized understanding.   Will wait for response letter back.   Called Sarah the rep for the vest. She said she already have appealed the denial. As of this time she said our office does not have anything further to do. However patient has a high Out of [employer] and [employer] made out a payment plan over 2 years with patient. Patient should have a 30 day trial to test it out if she doesn't like it can return it.   Called patient back and discussed this with patient and talk about changing her next OV to Brian's , unable to reach left message for patient to call back.

## 2018-06-14 NOTE — Telephone Encounter (Signed)
TA-Pt was calling to report her BS as she was instructed during last visit but received a message for an e-visit/she is wanting to know if she can just report her sugars and if an e-visit is necessary/plz advise/thx dmf

## 2018-06-14 NOTE — Telephone Encounter (Signed)
[phone] pt returning Lauren's call

## 2018-06-14 NOTE — Telephone Encounter (Signed)
I sent pt a MyChart message informing that she can send in her sugar readings via a MyChart message that an appointment is not needed at this time/thx dmf

## 2018-06-14 NOTE — Telephone Encounter (Signed)
Spoke with pt and relayed info about vest.  Scheduled with BM on 05/21 at 2pm.  Pt asked if she could wear vest 3 times a day and BM stated it was ok.  Pt also inquired about dupixant.  Relayed to her PA was started and more info was needed to be sent to them yesterday.  We should hear from them in 24-72 hours.  Nothing further is needed.

## 2018-06-14 NOTE — Telephone Encounter (Signed)
06/14/2018 0920  Lauren,  Please contact the patient and let her know that we are in the process of appealing her insurance is denial of the therapy vest.  Please replaced the order for the therapy vest as a trial.  Patient has documented bronchiectasis this is in the last office note.  Patient also has used flutter valve therapy in the past and failed.  We also need to contact the [employer] vest representative to ask for their support in getting this approved.   Arlys John

## 2018-06-17 MED ORDER — DUPILUMAB 300 MG/2ML ~~LOC~~ SOSY: 300.0000 mg | PREFILLED_SYRINGE | SUBCUTANEOUS | 11 refills | Status: DC

## 2018-06-17 NOTE — Telephone Encounter (Signed)
I have not received anything back on the pt's PA decision. Called [facility] and spoke with Adelina Mings. PA has been approved 06/13/2018 - 12/14/2018.  Called [facility] to set up first shipment. Spoke with Erie Noe. A prescription will need to be e-prescribed to add the pt to their system. Once they receive the prescription they will reach out to [place] to set up shipment.

## 2018-06-19 NOTE — Telephone Encounter (Signed)
Called [facility] to follow up on this shipment. They received the prescription that I sent on 06/17/2018. Due to this being the pt's first shipment, they must get consent to ship from the pt before shipment can be set up. They are going to reach out to the pt and then contact us to set up shipment.

## 2018-06-21 ENCOUNTER — Telehealth: Payer: Self-pay | Admitting: Pulmonary Disease

## 2018-06-21 NOTE — Telephone Encounter (Signed)
Spoke with a Teacher, early years/pre at [employer]. They are shipping the medication to the pt. According to them, the pt requested this. Will await pt's call to make an appointment.

## 2018-06-21 NOTE — Telephone Encounter (Signed)
Called [facility]. They are needing clarification on the pt's Dupixent prescription. They need a verbal order for the loading dose of Dupixent. This has been given to the pharmacist. Nothing further was needed.

## 2018-06-24 ENCOUNTER — Other Ambulatory Visit: Payer: Self-pay | Admitting: Pulmonary Disease

## 2018-06-26 ENCOUNTER — Ambulatory Visit: Payer: BC Managed Care – PPO | Admitting: Pulmonary Disease

## 2018-06-26 ENCOUNTER — Ambulatory Visit: Payer: BC Managed Care – PPO | Admitting: Nurse Practitioner

## 2018-06-27 ENCOUNTER — Other Ambulatory Visit: Payer: Self-pay

## 2018-06-27 ENCOUNTER — Encounter: Payer: Self-pay | Admitting: Pulmonary Disease

## 2018-06-27 ENCOUNTER — Ambulatory Visit (INDEPENDENT_AMBULATORY_CARE_PROVIDER_SITE_OTHER): Payer: BC Managed Care – PPO | Admitting: Pulmonary Disease

## 2018-06-27 DIAGNOSIS — J479 Bronchiectasis, uncomplicated: Secondary | ICD-10-CM | POA: Diagnosis not present

## 2018-06-27 DIAGNOSIS — J45901 Unspecified asthma with (acute) exacerbation: Secondary | ICD-10-CM | POA: Diagnosis not present

## 2018-06-27 NOTE — Telephone Encounter (Signed)
06/27/2018 1448  Can we please contact the patient and schedule her for Dupixent injections sometime next week.  This will be the patient's first Dupixent injection.  She has the medication already at home.  She reports that she has an EpiPen.  She knows that she will need to be at our office for 2 hours after the injection.  By Cherre Huger FNP

## 2018-06-27 NOTE — Patient Instructions (Addendum)
Schedule Dupixent injection next week   Restart allergy shots   Bronchiectasis: This is the medical term which indicates that you have damage, dilated airways making you more susceptible to respiratory infection.  Use a flutter valve 10 breaths twice a day or 4 to 5 breaths 4-5 times a day to help clear mucus out Let us know if you have cough with change in mucus color or fevers or chills. At that point you would need an antibiotic. Maintain a healthy nutritious diet, eating whole foods Take your medications as prescribed  Continue Respitech therapy vest - 2x daily, can increase to 3x daily if needed   Continue  Mucinex 600mg  daily   Continue Hypertonic saline as ordered  Continue allegra daily  Continue Singulair daily  Continue Flonase  Continue Symbicort160 >>> 2 puffs in the morning right when you wake up, rinse out your mouth after use, 12 hours later 2 puffs, rinse after use >>> Take this daily, no matter what >>> This is not a rescue inhaler  ContinueSpiriva Respimat 1.25 >>> 2 puffs daily >>> Do this every day >>>This is not a rescue inhaler   Return in about 6 weeks (around 08/08/2018), or if symptoms worsen or fail to improve, for Follow up with [name] FNP-C.   Coronavirus (COVID-19) Are you at risk?  Are you at risk for the Coronavirus (COVID-19)?  To be considered HIGH RISK for Coronavirus (COVID-19), you have to meet the following criteria:  . Traveled to [place], [place], [place], [place] or [place]; or in the [place] to [place], [place], [place], or [place]; and have fever, cough, and shortness of breath within the last 2 weeks of travel OR . Been in close contact with a person diagnosed with COVID-19 within the last 2 weeks and have fever, cough, and shortness of breath . IF YOU DO NOT MEET THESE CRITERIA, YOU ARE CONSIDERED LOW RISK FOR COVID-19.  What to do if you are HIGH RISK for COVID-19?  Marland Kitchen If you are having a medical  emergency, call 911. . Seek medical care right away. Before you go to a doctor's office, urgent care or emergency department, call ahead and tell them about your recent travel, contact with someone diagnosed with COVID-19, and your symptoms. You should receive instructions from your physician's office regarding next steps of care.  . When you arrive at healthcare provider, tell the healthcare staff immediately you have returned from visiting [place], [place], [place], [place] or [place]; or traveled in the [place] to [place], [place], [place], or [place]; in the last two weeks or you have been in close contact with a person diagnosed with COVID-19 in the last 2 weeks.   . Tell the health care staff about your symptoms: fever, cough and shortness of breath. . After you have been seen by a medical provider, you will be either: o Tested for (COVID-19) and discharged home on quarantine except to seek medical care if symptoms worsen, and asked to  - Stay home and avoid contact with others until you get your results (4-5 days)  - Avoid travel on public transportation if possible (such as bus, train, or airplane) or o Sent to the Emergency Department by EMS for evaluation, COVID-19 testing, and possible admission depending on your condition and test results.  What to do if you are LOW RISK for COVID-19?  Reduce your risk of any infection by using the same precautions used for avoiding the common  cold or flu:  Marland Kitchen. Wash your hands often with soap and warm water for at least 20 seconds.  If soap and water are not readily available, use an alcohol-based hand sanitizer with at least 60% alcohol.  . If coughing or sneezing, cover your mouth and nose by coughing or sneezing into the elbow areas of your shirt or coat, into a tissue or into your sleeve (not your hands). . Avoid shaking hands with others and consider head nods or verbal greetings only. . Avoid touching your eyes, nose, or mouth with  unwashed hands.  . Avoid close contact with people who are sick. . Avoid places or events with large numbers of people in one location, like concerts or sporting events. . Carefully consider travel plans you have or are making. . If you are planning any travel outside or inside the [place], visit the CDC's Travelers' Health webpage for the latest health notices. . If you have some symptoms but not all symptoms, continue to monitor at home and seek medical attention if your symptoms worsen. . If you are having a medical emergency, call 911.   ADDITIONAL HEALTHCARE OPTIONS FOR PATIENTS  [facility] / e-Visit: [email or web address]         [facility]: [phone]  [facility]: [phone]                   [facility]: [phone]           It is flu season:   >>> Best ways to protect herself from the flu: Receive the yearly flu vaccine, practice good hand hygiene washing with soap and also using hand sanitizer when available, eat a nutritious meals, get adequate rest, hydrate appropriately   Please contact the office if your symptoms worsen or you have concerns that you are not improving.   Thank you for choosing [facility] for your healthcare, and for allowing us to partner with you on your healthcare journey. I am thankful to be able to provide care to you today.   Elisha HeadlandBrian Mack FNP-C

## 2018-06-27 NOTE — Assessment & Plan Note (Signed)
Assessment: October/2019 high-resolution CT chest shows mild cylindrical bronchiectasis Patient reports clinical improvement since starting therapy vest Patient continues to take Mucinex daily Patient is managed on hypertonic saline, Symbicort 160, Spiriva Respimat 1.25, albuterol nebulized medications, Mucinex  Plan: Continue hypertonic saline Continue Symbicort 160 Continue therapy vest Continue Spiriva Respimat 1.25 Continue daily align 4-week follow-up their office for telephonic outreach We will hold off on culturing sputum as recent sputum culture in October/2019 was negative

## 2018-06-27 NOTE — Assessment & Plan Note (Signed)
Assessment: Patient reports adherence to Symbicort 160 as well as Spiriva Respimat 1.25 Patient is feeling clinical improvement from last 4-week telephonic visit Resolved exacerbation Patient has Dupixent medication at home and plans to start Dupixent injections next week October/2019 CBC with differential shows an eosinophil count of 300  Plan: Continue Symbicort 160 Continue Spiriva Respimat 1.25 Schedule Dupixent injection to be completed next week Resume allergy injections based off of allergist recommendations 6-week follow-up with our office

## 2018-06-27 NOTE — Telephone Encounter (Signed)
Call made to patient, appt made for 07/02/2018 at 10am. Aware to bring epi-pen. Aware she will have to be here for 2 hours. Reminded patient to keep medication refrigerated until her appt date. Voiced understanding. Nothing further needed at this time.

## 2018-06-27 NOTE — Progress Notes (Signed)
Virtual Visit via Telephone Note  I connected with Karen Barnes on 06/27/18 at  2:00 PM EDT by telephone and verified that I am speaking with the correct person using two identifiers.  Location: Patient: Home Provider: Office Lexicographer- [facility] - [address], [address], [place], [place] [id number]   I discussed the limitations, risks, security and privacy concerns of performing an evaluation and management service by telephone and the availability of in person appointments. I also discussed with the patient that there may be a patient responsible charge related to this service. The patient expressed understanding and agreed to proceed.  Patient consented to consult via telephone: Yes People present and their role in pt care: Pt   History of Present Illness: 58 year old female never smoker, first seen in 2017 for asthma and a pulmonary nodule She has severe GERD and is followed by Dr. Ewing SchleinMagod.  She started on immunotherapy in 2017 She was born prematurely at 8526 weeks.   Patient of Dr. Kendrick FriesMcQuaid  Chief complaint: 4 week follow up / Cough    58 year old female never smoker followed in our office for asthma as well as bronchiectasis.  Patient is completing a 4-week follow-up with our office today.  Patient was last treated with antibiotics as well as steroid taper for an asthmatic bronchitis.  Patient was also started on a therapy vest as well as orders were placed to start Dupixent therapy.  Patient reports that the therapy vest has helped significantly with helping the patient mobilize secretions.  She reports she is using her therapy vest 2-3 times daily after using her hypertonic saline she reports she is able to produce significantly more sputum and feels that her breathing is doing better.  She is received her first Dupixent injector in the mail.  She is planning on scheduling a visit to our office next week in order to receive Dupixent for the first time.  She continues to be maintained on  Symbicort 160 as well as Spiriva Respimat 1.25.   Observations/Objective:  Chest imaging November 07, 2017 high-resolution CT scan of the chest images : Mild cylindrical bronchiectasis particularly in the left lower lobe, some in the right lower lobe 10/18/2015 chest CT images  showing normal pulmonary parenchyma with the exception of a 3.4 mm nodule in the left lower lobe.  Pulmonary function test: October 2019 ratio 90%, FVC 2.56 L 76% predicted, total lung capacity 4.32 L 84% predicted, DLCO 20.7 mL 84% predicted  Labs: October 2019 complement levels within normal limits, IgG subclasses all normal, alpha-1 antitrypsin level normal, CBC showed 300 absolute eosinophil count  11/03/2015 - Ige - 32  Heart catheterization: October 2019 right heart catheterization showed mean PA pressure 24, pulmonary capillary wedge pressure 19, PVR 0.7 [place] units  Assessment and Plan:  Bronchiectasis without complication [facility](HCC) Assessment: October/2019 high-resolution CT chest shows mild cylindrical bronchiectasis Patient reports clinical improvement since starting therapy vest Patient continues to take Mucinex daily Patient is managed on hypertonic saline, Symbicort 160, Spiriva Respimat 1.25, albuterol nebulized medications, Mucinex  Plan: Continue hypertonic saline Continue Symbicort 160 Continue therapy vest Continue Spiriva Respimat 1.25 Continue daily align 4-week follow-up their office for telephonic outreach We will hold off on culturing sputum as recent sputum culture in October/2019 was negative  Asthmatic bronchitis with acute exacerbation Assessment: Patient reports adherence to Symbicort 160 as well as Spiriva Respimat 1.25 Patient is feeling clinical improvement from last 4-week telephonic visit Resolved exacerbation Patient has Dupixent medication at  home and plans to start Dupixent injections next week October/2019 CBC with differential shows an eosinophil count of  300  Plan: Continue Symbicort 160 Continue Spiriva Respimat 1.25 Schedule Dupixent injection to be completed next week Resume allergy injections based off of allergist recommendations 6-week follow-up with our office  Follow Up Instructions:  Return in about 6 weeks (around 08/08/2018), or if symptoms worsen or fail to improve, for Follow up with [name] FNP-C.   I discussed the assessment and treatment plan with the patient. The patient was provided an opportunity to ask questions and all were answered. The patient agreed with the plan and demonstrated an understanding of the instructions.   The patient was advised to call back or seek an in-person evaluation if the symptoms worsen or if the condition fails to improve as anticipated.  I provided 24 minutes of non-face-to-face time during this encounter.   [name], NP

## 2018-06-27 NOTE — Progress Notes (Signed)
Reviewed, agree 

## 2018-07-02 ENCOUNTER — Ambulatory Visit: Payer: BC Managed Care – PPO | Admitting: Pulmonary Disease

## 2018-07-02 ENCOUNTER — Ambulatory Visit (INDEPENDENT_AMBULATORY_CARE_PROVIDER_SITE_OTHER): Payer: BC Managed Care – PPO

## 2018-07-02 ENCOUNTER — Other Ambulatory Visit: Payer: Self-pay

## 2018-07-02 ENCOUNTER — Telehealth: Payer: Self-pay | Admitting: Pharmacy Technician

## 2018-07-02 DIAGNOSIS — J4551 Severe persistent asthma with (acute) exacerbation: Secondary | ICD-10-CM | POA: Diagnosis not present

## 2018-07-02 MED ORDER — DUPILUMAB 300 MG/2ML ~~LOC~~ SOSY
600.0000 mg | PREFILLED_SYRINGE | Freq: Once | SUBCUTANEOUS | Status: AC
  Administered 2018-07-02: 12:00:00 600 mg via SUBCUTANEOUS

## 2018-07-02 NOTE — Telephone Encounter (Signed)
I don't think so. Is probably her going from the bldg to her car. (Allergies or it could be a combination of the 2.) Please ask the APP of the day to be sure.

## 2018-07-02 NOTE — Telephone Encounter (Signed)
Thank you for working with the patient on this.  [name], FNP

## 2018-07-02 NOTE — Telephone Encounter (Signed)
Called and spoke with pt to make sure that she took the allegra this morning and pt stated that she did take all of her allergy meds as prescribed. Stated to pt that I spoke with both Arlys John and Dimas Millin who both stated they believed the eye drainage is just due to allergies and not due to the Dupixent and pt expressed understanding. Stated to pt that Arlys John stated for her to take benadryl every 6 hours as needed and call back in 24-48 hours or sooner if symptoms are not improving and pt verbalized understanding.  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

## 2018-07-02 NOTE — Telephone Encounter (Signed)
Called and spoke with pt who stated she began having drainage from rt eye which began around 12:45. Pt had first dupixent injection this morning and stated she noticed the drainage shortly after she left the office due to having to stay at office 2 hours after the injection. Pt states her eye is not red just draining and every time she wipes tears away, more drainage happens.  Pt is wanting to know if this could be a possible reaction from the Dupixent. Tammy, please advise on this for pt. Thanks!

## 2018-07-02 NOTE — Progress Notes (Signed)
Have you been hospitalized within the last 10 days?  No Do you have a fever?  No Do you have a cough? Yes allergy cough. Do you have a headache or sore throat? No   Patient presented to the office today for first-time Dupixent injection.  Primary Pulmonologist: [name] NP Medication name: Dupixent Strength: 600mg  Site(s): Lt. & Rt. Arm  Epi pen/Auvi-Q visible during appointment: Yes  Time of injection: 10:14  Patient evaluated every 15-20 minutes per protocol x2 hours.  1st check: 10:34AM Evaluation: Rt. Arm was red around the site where the band-aid was. Lt. Arm pt had a pinhead size reaction on the site and red around the site. (band-aid) No itching.  2nd check: 10:54 AM Evaluation: Rt. Redness faded away. Lt. Arm redness around the site faded away but the pinhead sized site is fading(it was more pink this time.) No itching.  3rd check: 11:14 AM Evaluation: Lt. Arm pinhead size reaction still fading. No itching.  4th check: 11:34 AM Evaluation: Still no itching,Lt arm reaction fading.  5th check: 11:54AM Evaluation: Reaction on Lt. Arm is gone.  6th check: 12:14PM Evaluation:No reactions or sx. Pt is doing great. She is up front making her next appt.. Nothing further needed.

## 2018-07-10 ENCOUNTER — Other Ambulatory Visit: Payer: Self-pay

## 2018-07-10 ENCOUNTER — Ambulatory Visit
Admission: RE | Admit: 2018-07-10 | Discharge: 2018-07-10 | Disposition: A | Discharge status: Home / Self Care | Payer: BC Managed Care – PPO | Source: Ambulatory Visit | Attending: Internal Medicine | Admitting: Internal Medicine

## 2018-07-10 DIAGNOSIS — E041 Nontoxic single thyroid nodule: Secondary | ICD-10-CM

## 2018-07-16 ENCOUNTER — Telehealth: Payer: Self-pay

## 2018-07-16 ENCOUNTER — Ambulatory Visit (INDEPENDENT_AMBULATORY_CARE_PROVIDER_SITE_OTHER): Payer: BC Managed Care – PPO

## 2018-07-16 ENCOUNTER — Other Ambulatory Visit: Payer: Self-pay

## 2018-07-16 DIAGNOSIS — J4551 Severe persistent asthma with (acute) exacerbation: Secondary | ICD-10-CM

## 2018-07-16 MED ORDER — DUPILUMAB 300 MG/2ML ~~LOC~~ SOSY
300.0000 mg | PREFILLED_SYRINGE | Freq: Once | SUBCUTANEOUS | Status: AC
  Administered 2018-07-16: 12:00:00 300 mg via SUBCUTANEOUS

## 2018-07-16 NOTE — Progress Notes (Signed)
Have you been hospitalized within the last 10 days?  No Do you have a fever?  No Do you have a cough?  Yes. Bronchiectasis cough, the reason she's taking this med.. Do you have a headache or sore throat? No

## 2018-07-16 NOTE — Telephone Encounter (Signed)
Copied from [place] [id number]. Topic: General - Inquiry >> Jul 16, 2018  9:49 AM Virl Axe D wrote: Reason for CRM: Pt stated she has to go to her pulmonologist twice a month to get her Dupixant injections. The office is out of town and it costs her $160 a month. She would like to know if she can start having her injections done a [facility] as it is closer and more cost effective. She receives the medication in the mail and would bring it with her. Please advise. [phone]

## 2018-07-17 NOTE — Telephone Encounter (Signed)
RB-Plz see message below/pt drives out of town to her Pulmonologist twice monthly for Dupixant injections and the cost for that visit is $160.00/She would like to know if she can bring the injections here for nurse visits as she receives the medication via mail/plz advise/thx dmf

## 2018-07-18 NOTE — Telephone Encounter (Signed)
Sent pt a Youth worker response/thx dmf

## 2018-07-22 ENCOUNTER — Ambulatory Visit: Payer: BC Managed Care – PPO | Admitting: Internal Medicine

## 2018-07-22 ENCOUNTER — Encounter: Payer: Self-pay | Admitting: Internal Medicine

## 2018-07-22 ENCOUNTER — Other Ambulatory Visit: Payer: Self-pay

## 2018-07-22 VITALS — BP 132/84 | HR 88 | Temp 98.7°F | Ht 64.0 in | Wt 183.8 lb

## 2018-07-22 DIAGNOSIS — E041 Nontoxic single thyroid nodule: Secondary | ICD-10-CM

## 2018-07-22 NOTE — Patient Instructions (Signed)
-   Your thyroid nodule remains stable, we will repeat ultrasound in 1 year followed by a follow up visit.

## 2018-07-22 NOTE — Progress Notes (Signed)
Name: Karen CottonBeverly B Hodgman  MRN/ DOB: [id number], [birth year]    Age/ Sex: 58 y.o., female     PCP: Dianne DunAron, Talia M, MD   Reason for Endocrinology Evaluation: Thyroid nodule      Initial Endocrinology Clinic Visit: 12/28/2017    PATIENT IDENTIFIER: Karen Barnes is a 58 y.o., female with a past medical history of T2DM, Asthma, GERD,Thyroid nodule and bronchiectasis. She has followed with [facility] since 12/28/2017 for consultative assistance with management of her thyroid nodule.   HISTORICAL SUMMARY: The patient was first diagnosed with right thyroid nodule in 11/2017 during CT scan of the chest at was performed for evaluation of cough.  S/P FNA with benign cytology in October, 2019  Per patient she has had intermittently low TSH.   SUBJECTIVE:   Today (07/22/2018):  Karen Barnes is here for a follow up on right thyroid nodule. She denies any local neck symptoms except for pain following the thyroid ultrasound as well as a bad taste in her mouth . She denies dysphagia, hoarseness nor pain.   Has chronic cough due to bronchiectasis.  She has received 42 days of steroids since the start of the spring which could explain the weight gain. No constipation or anxiety.   ROS:  As per HPI.   HISTORY:  Past Medical History:  Past Medical History:  Diagnosis Date  . Asthma, moderate persistent    sees Dr Irena CordsVan Winkle  . Diabetes mellitus    A1C 6.2---->  2009  . Family history of adverse reaction to anesthesia    sister had cardiac arrest with rhinoplasty  . Fibrocystic breast   . GERD (gastroesophageal reflux disease)   . HH (hiatus hernia) 09/2014   Dr. Ewing SchleinMagod  . History of echocardiogram    Echo 5/19: EF 55-60, normal wall motion, PASP 41  . History of hiatal hernia   . History of nuclear stress test    Nuclear stress test 5/19:  EF 64, normal perfusion, Low risk  . Hot flashes   . Hyperlipidemia   . Normal nuclear stress test 2006  . Palpitations    PACs  sees cards   . Recurrent cystitis    took  postcoital antibiotic for years, symptoms better after her hysterectomy 1996, symptoms re-surface in 2011  . RUQ abdominal pain 2016   EGD showed small HH, otherwise normal  . Wears glasses    Past Surgical History:  Past Surgical History:  Procedure Laterality Date  . ABDOMINAL HYSTERECTOMY  1996   no oophorectomy, d/tendometriosis and fibroids approximately 1996  . BREAST EXCISIONAL BIOPSY Right 03/12/2014   [facility]  . BREAST LUMPECTOMY WITH RADIOACTIVE SEED LOCALIZATION Right 03/12/2014   Procedure: BREAST LUMPECTOMY WITH RADIOACTIVE SEED LOCALIZATION;  Surgeon: Harriette Bouillonhomas Cornett, MD;  Location: [facility];  Service: General;  Laterality: Right;  . BREAST SURGERY     Lumpectomy-- BENIGN   . CATARACT EXTRACTION Bilateral 2011  . CESAREAN SECTION    . COSMETIC SURGERY  01/2017   MonaLisa Touch w/ GYN  . DILATION AND CURETTAGE OF UTERUS    . LIPOMA EXCISION  1978  . RIGHT HEART CATH N/A 11/26/2017   Procedure: RIGHT HEART CATH;  Surgeon: Laurey MoraleMcLean, Dalton S, MD;  [facility];  Service: Cardiovascular;  Laterality: N/A;  . UPPER GASTROINTESTINAL ENDOSCOPY    . UPPER GI ENDOSCOPY  09/11/2014   Small Hiatus Hernia, Dr. Ewing SchleinMagod    Social History:  reports that she has  never smoked. She has never used smokeless tobacco. She reports that she does not drink alcohol or use drugs. Family History:  Family History  Problem Relation Age of Onset  . Mitral valve prolapse Mother   . Breast cancer Mother 2863       M had ca insitu (age 60), aunt   . Dementia Mother   . Heart disease Father 1240       early   . Diabetes Father   . Kidney failure Father   . Sleep apnea Father   . Brain cancer Other        2 fam members   . Lung cancer Other        uncle, heavy smoker  . Breast cancer Maternal Aunt 60  . CAD Paternal Grandmother 2840  . CAD Paternal Grandfather 4540  . Colon cancer Neg Hx      HOME MEDICATIONS: Allergies as of  07/22/2018      Reactions   Butorphanol Tartrate Other (See Comments)   REACTION: hallucinations   Hydrocodone Other (See Comments)   HALLUCINATIONS   Roxicet [oxycodone-acetaminophen] Other (See Comments)   HALLUCINATIONS   Penicillins Hives   Occurred at age 2/CHILDHOOD ALLERGY Has patient had a PCN reaction causing immediate rash, facial/tongue/throat swelling, SOB or lightheadedness with hypotension: Unknown Has patient had a PCN reaction causing severe rash involving mucus membranes or skin necrosis: Unknown Has patient had a PCN reaction that required hospitalization: Unknown Has patient had a PCN reaction occurring within the last 10 years: No If all of the above answers are "NO", then may proceed with Cephalosporin use.      Medication List       Accurate as of July 22, 2018 12:09 PM. If you have any questions, ask your nurse or doctor.        albuterol (2.5 MG/3ML) 0.083% nebulizer solution Commonly known as: PROVENTIL Take 2.5 mg by nebulization every 6 (six) hours as needed for wheezing or shortness of breath.   albuterol 108 (90 Base) MCG/ACT inhaler Commonly known as: VENTOLIN HFA Inhale 2 puffs into the lungs every 6 (six) hours as needed.   aspirin EC 81 MG tablet Take 81 mg by mouth daily.   Auvi-Q 0.3 mg/0.3 mL Soaj injection Generic drug: EPINEPHrine Inject 0.3 mg into the muscle as directed. For immunotherapy (allergy shots) anaphylactic reactions   bifidobacterium infantis capsule Take 1 capsule by mouth daily.   Calcium 600+D 600-200 MG-UNIT Tabs Generic drug: Calcium Carbonate-Vitamin D Take 1 tablet by mouth daily.   diltiazem 30 MG tablet Commonly known as: Cardizem Take 1 tablet (30 mg total) by mouth every 6 (six) hours as needed (for palpitations).   Dupilumab 300 MG/2ML Sosy Commonly known as: Dupixent Inject 300 mg into the skin every 14 (fourteen) days.   fexofenadine 180 MG tablet Commonly known as: ALLEGRA Take 180 mg by mouth  every morning.   fluticasone 50 MCG/ACT nasal spray Commonly known as: FLONASE Place 2 sprays into both nostrils 2 (two) times daily.   FreeStyle Libre 14 Day Reader Hardie Pulleyevi USE AS DIRECTED   FreeStyle Libre Sensor System Misc CHECK BLOOD SUGAR DAILY AS DIRECTED. CHANGE DEVICE EVERY 10 DAYS   FreeStyle Libre 14 Day Sensor Misc USE AS DIRECTED PER MD   furosemide 20 MG tablet Commonly known as: LASIX TAKE 1 TABLET(20 MG) BY MOUTH DAILY AS NEEDED FOR FLUID RETENTION OR SWELLING   hydrocortisone 2.5 % cream APPLY EXTERNALLY TO THE AFFECTED AREA TWICE DAILY  AS NEEDED What changed: See the new instructions.   levofloxacin 500 MG tablet Commonly known as: LEVAQUIN Take 1 tablet (500 mg total) by mouth daily.   metFORMIN 1000 MG tablet Commonly known as: GLUCOPHAGE Take 1 tablet by mouth 2 (two) times daily.   montelukast 10 MG tablet Commonly known as: SINGULAIR Take 10 mg by mouth at bedtime.   multivitamin with minerals tablet Take 1 tablet by mouth daily.   nitrofurantoin (macrocrystal-monohydrate) 100 MG capsule Commonly known as: MACROBID TAKE AS DIRECTED POST COITAL What changed: See the new instructions.   omeprazole 40 MG capsule Commonly known as: PRILOSEC Take 40 mg by mouth at bedtime.   ondansetron 4 MG disintegrating tablet Commonly known as: ZOFRAN-ODT DISSOLVE 1 TABLET BY MOUTH EVERY 8 HOURS AS NEEDED FOR NAUSEA OR VOMITING   ondansetron 8 MG disintegrating tablet Commonly known as: Zofran ODT Take 1 tablet (8 mg total) by mouth every 8 (eight) hours as needed for nausea or vomiting.   pantoprazole 40 MG tablet Commonly known as: PROTONIX TAKE 1 TABLET(40 MG) BY MOUTH DAILY   predniSONE 10 MG tablet Commonly known as: DELTASONE 4 tabs for 5 days, then 3 tabs for 5 days, 2 tabs for 5 days, then 1 tab for 5 days, then stop   PRESCRIPTION MEDICATION Allergy Shots   rosuvastatin 5 MG tablet Commonly known as: CRESTOR TAKE 1 TABLET(5 MG) BY MOUTH  DAILY   sitaGLIPtin 100 MG tablet Commonly known as: Januvia Take 1 tablet (100 mg total) by mouth daily.   sodium chloride HYPERTONIC 3 % nebulizer solution Take by nebulization 2 (two) times daily.   Spiriva Respimat 1.25 MCG/ACT Aers Generic drug: Tiotropium Bromide Monohydrate INHALE 2 PUFF BY MOUTH EVERY DAY   Symbicort 160-4.5 MCG/ACT inhaler Generic drug: budesonide-formoterol Inhale 2 puffs into the lungs 2 (two) times daily.         OBJECTIVE:   PHYSICAL EXAM: VS: BP 132/84 (BP Location: Left Arm, Patient Position: Sitting, Cuff Size: Normal)   Pulse 88   Temp 98.7 F (37.1 C)   Ht 5\' 4"  (1.626 m)   Wt 183 lb 12.8 oz (83.4 kg)   SpO2 98%   BMI 31.55 kg/m    EXAM: General: Pt appears well and is in NAD  Ears, Nose, Throat: Hearing: Grossly intact bilaterally Dental: Good dentition  Throat: Clear without mass, erythema or exudate  Neck: General: Supple without adenopathy. Thyroid: Thyroid size normal.  No goiter or nodules appreciated. No thyroid bruit.  Lungs: Clear with good BS bilat with no rales, rhonchi, or wheezes  Heart: Auscultation: RRR.  Abdomen: Normoactive bowel sounds, soft, nontender, without masses or organomegaly palpable  Extremities:  BL LE: No pretibial edema normal ROM and strength.  Skin: Hair: Texture and amount normal with gender appropriate distribution Skin Inspection: No rashes Skin Palpation: Skin temperature, texture, and thickness normal to palpation  Mental Status: Judgment, insight: Intact Orientation: Oriented to time, place, and person Mood and affect: No depression, anxiety, or agitation     DATA REVIEWED: Results for MALLORIE, NORROD (MRN [id number]) as of 07/22/2018 12:10  Ref. Range 12/11/2017 09:30  TSH Latest Ref Range: 0.35 - 4.50 uIU/mL 1.46  Triiodothyronine (T3) Latest Ref Range: 76 - 181 ng/dL 139  T4,Free(Direct) Latest Ref Range: 0.60 - 1.60 ng/dL 0.87  T3 Uptake Latest Ref Range: 22 - 35 % 28     Thyroid Ultrasound 07/10/2018   Again noted is a mixed cystic and solid nodule in the  posterior mid right thyroid lobe. This nodule was previously biopsied on 12/04/2017. The nodule measures 1.3 x 0.7 x 0.9 cm and previously measured 1.5 x 0.9 x 1.0 cm.  Again noted is a 0.5 cm hypoechoic nodule along the anterior mid right thyroid lobe that appears stable and does not meet criteria for biopsy or dedicated follow-up. Additional small thyroid nodules do not meet criteria for biopsy or dedicated follow-up.  Again noted are small lymph nodes in the neck. Index lymph node in the left supraclavicular area measures 0.6 cm in short axis and stable.  IMPRESSION: 1. Dominant right thyroid nodule is stable. This nodule was previously biopsied. 2. Multiple small thyroid nodules that do not meet criteria for biopsy or dedicated follow-up. 3. Again noted are small cervical lymph nodes. Index lymph node in left supraclavicular area appears stable.    ASSESSMENT / PLAN / RECOMMENDATIONS:   1. Multinodular Goiter:   - Patient denies any local symptoms, pain following thyroid ultrasound due to manipulation and the taste the only explanation is somehow she either gets gel in her mouth or most likely related to GERD given the hyperextension of the neck required to perform the procedure.  - She is biochemically euthyroid.  - S/P FNA of the right thyroid nodule in October,2019 with benign cytology.  - Repeat ultrasound in 07/2018 assures stability  - Reassurance provided at this time  - Will set her up for a repeat ultrasound in 1 yr      F/u in 1 yr   Signed electronically by: Lyndle HerrlichAbby Jaralla Aquarius Tremper, MD  [facility]  [facility] [address] [place], [place] [address] Phone: [phone] FAX: [phone]      CC: Dianne DunAron, Talia M, MD [address] [place] [address] Phone: [phone]  Fax: [phone]   Return to  Endocrinology clinic as below: Future Appointments  Date Time Provider Department Center  07/30/2018  1:30 PM [facility] IN[facility] None  09/09/2018  8:00 AM Dianne DunAron, Talia M, MD [facility]  07/21/2019  9:30 AM Loman Logan, Konrad DoloresIbtehal Jaralla, MD [facility] None

## 2018-07-26 ENCOUNTER — Telehealth: Payer: Self-pay | Admitting: Pulmonary Disease

## 2018-07-26 NOTE — Telephone Encounter (Signed)
Spoke with pt.. She wanted to know how far apart your injections have to be. Fourteen days any sooner than that your ins won't cover it. Pt understood and is going to call 07/29/2018 and ask to speak to MQ's nurse about getting her injections at home.. I don't know who that is. I'll route this to [place] so he can reply and route it to his nurse.

## 2018-07-27 NOTE — Telephone Encounter (Signed)
14 days

## 2018-07-29 NOTE — Telephone Encounter (Signed)
Spoke with Aaron Edelman about this pt. Advised him that the pt would be the one who would administer the medication to herself. We typically have the pt come in for the first 3-4 injections, that way we can properly teach them how to administer the medication. Message will be routed back to [place] per his request.

## 2018-07-29 NOTE — Telephone Encounter (Signed)
I would be okay considering this after a few injections at our office. I believe she just recently started the injections.   Would she be the one administering?   Aaron Edelman

## 2018-07-29 NOTE — Telephone Encounter (Signed)
Aaron Edelman are you okay with the pt giving herself the injections at home? Thanks.

## 2018-07-29 NOTE — Telephone Encounter (Signed)
Spoke with pt. She is aware of Brian's response. Pt will come in for the next 2 injections for training. Nothing further was needed at this time.

## 2018-07-29 NOTE — Telephone Encounter (Signed)
Agree with patient's document her office for the next 2 to 3 injections to where we can appropriately teach the patient how to administer the injections.  When the patient is comfortable starting injections on her own.  And I believe that she can do so.   Wyn Quaker FNP

## 2018-07-30 ENCOUNTER — Ambulatory Visit (INDEPENDENT_AMBULATORY_CARE_PROVIDER_SITE_OTHER): Payer: BC Managed Care – PPO

## 2018-07-30 ENCOUNTER — Other Ambulatory Visit: Payer: Self-pay

## 2018-07-30 DIAGNOSIS — J4551 Severe persistent asthma with (acute) exacerbation: Secondary | ICD-10-CM | POA: Diagnosis not present

## 2018-07-30 MED ORDER — DUPILUMAB 300 MG/2ML ~~LOC~~ SOSY
300.0000 mg | PREFILLED_SYRINGE | Freq: Once | SUBCUTANEOUS | Status: AC
  Administered 2018-07-30: 300 mg via SUBCUTANEOUS

## 2018-07-30 NOTE — Patient Instructions (Signed)
No inj charge, taught pt to give inj to herself. She admin this inj.. Per BQ pt will start giving herself injs at home.

## 2018-07-30 NOTE — Progress Notes (Signed)
Have you been hospitalized within the last 10 days?  No Do you have a fever?  No Do you have a cough?  Yes, same old cough, BQ said she would have it for the rest of her life. Do you have a headache or sore throat? No   No charge for administration. I've been teaching pt up to second inj.. Today pt gave herself a Dupixent inj.. Per BQ pt will be giving her Dupixent injs at home.

## 2018-08-12 ENCOUNTER — Ambulatory Visit: Payer: BC Managed Care – PPO

## 2018-08-14 ENCOUNTER — Encounter: Payer: Self-pay | Admitting: *Deleted

## 2018-08-14 ENCOUNTER — Telehealth: Payer: Self-pay | Admitting: *Deleted

## 2018-08-14 NOTE — Telephone Encounter (Signed)
Virtual Visit Pre-Appointment Phone Call  "(Name), I am calling you today to discuss your upcoming appointment. We are currently trying to limit exposure to the virus that causes COVID-19 by seeing patients at home rather than in the office."  1. "What is the BEST phone number to call the day of the visit?" - include this in appointment notes-YES UPDATED  2. Do you have or have access to (through a family member/friend) a smartphone with video capability that we can use for your visit?" a. If yes - list this number in appt notes as cell (if different from BEST phone #) and list the appointment type as a VIDEO visit in appointment notes-YES UPDATED  3. Confirm consent - "In the setting of the current Covid19 crisis, you are scheduled for a ( video) visit with Dr. Delton SeeNelson on 7/14 at 0930 .  Just as we do with many in-office visits, in order for you to participate in this visit, we must obtain consent.  If you'd like, I can send this to your mychart (if signed up) or email for you to review.  Otherwise, I can obtain your verbal consent now.  All virtual visits are billed to your insurance company just like a normal visit would be.  By agreeing to a virtual visit, we'd like you to understand that the technology does not allow for your provider to perform an examination, and thus may limit your provider's ability to fully assess your condition. If your provider identifies any concerns that need to be evaluated in person, we will make arrangements to do so.  Finally, though the technology is pretty good, we cannot assure that it will always work on either your or our end, and in the setting of a video visit, we may have to convert it to a phone-only visit.  In either situation, we cannot ensure that we have a secure connection.  Are you willing to proceed?" STAFF: Did the patient verbally acknowledge consent to telehealth visit? Document YES/NO here: YES PT GAVE VERBAL CONSENT AS WELL AS CONSENT TO TREAT  IN  MYCHART  4. Advise patient to be prepared - "Two hours prior to your appointment, go ahead and check your blood pressure, pulse, oxygen saturation, and your weight (if you have the equipment to check those) and write them all down. When your visit starts, your provider will ask you for this information. If you have an Apple Watch or Kardia device, please plan to have heart rate information ready on the day of your appointment. Please have a pen and paper handy nearby the day of the visit as well."-YES PT AWARE  Give patient instructions for MyChart download to smartphone OR Doximity/Doxy.me as below if video visit (depending on what platform provider is using)-PT AWARE OF HOW TO USE DOXY.ME  5. Inform patient they will receive a phone call 15 minutes prior to their appointment time (may be from unknown caller ID) so they should be prepared to answer-YES PT AWARE    TELEPHONE CALL NOTE  Julieanne CottonBeverly B Percle has been deemed a candidate for a follow-up tele-health visit to limit community exposure during the Covid-19 pandemic. I spoke with the patient via phone to ensure availability of phone/video source, confirm preferred email & phone number, and discuss instructions and expectations.  I reminded Julieanne CottonBeverly B Cruickshank to be prepared with any vital sign and/or heart rhythm information that could potentially be obtained via home monitoring, at the time of her visit. I reminded Meriam SpragueBeverly  Grayland Ormond to expect a phone call prior to her visit.  Nuala Alpha, LPN 4/0/9811 9:14 PM    IF USING DOXIMITY or DOXY.ME - The patient will receive a link just prior to their visit by text.--PT AWARE OF HOW TO USE DOXY.ME PLATFORM     FULL LENGTH CONSENT FOR TELE-HEALTH VISIT   I hereby voluntarily request, consent and authorize [facility] and its employed or contracted physicians, physician assistants, nurse practitioners or other licensed health care professionals (the Practitioner), to provide me with  telemedicine health care services (the Services") as deemed necessary by the treating Practitioner. I acknowledge and consent to receive the Services by the Practitioner via telemedicine. I understand that the telemedicine visit will involve communicating with the Practitioner through live audiovisual communication technology and the disclosure of certain medical information by electronic transmission. I acknowledge that I have been given the opportunity to request an in-person assessment or other available alternative prior to the telemedicine visit and am voluntarily participating in the telemedicine visit.  I understand that I have the right to withhold or withdraw my consent to the use of telemedicine in the course of my care at any time, without affecting my right to future care or treatment, and that the Practitioner or I may terminate the telemedicine visit at any time. I understand that I have the right to inspect all information obtained and/or recorded in the course of the telemedicine visit and may receive copies of available information for a reasonable fee.  I understand that some of the potential risks of receiving the Services via telemedicine include:   Delay or interruption in medical evaluation due to technological equipment failure or disruption;  Information transmitted may not be sufficient (e.g. poor resolution of images) to allow for appropriate medical decision making by the Practitioner; and/or   In rare instances, security protocols could fail, causing a breach of personal health information.  Furthermore, I acknowledge that it is my responsibility to provide information about my medical history, conditions and care that is complete and accurate to the best of my ability. I acknowledge that Practitioner's advice, recommendations, and/or decision may be based on factors not within their control, such as incomplete or inaccurate data provided by me or distortions of diagnostic  images or specimens that may result from electronic transmissions. I understand that the practice of medicine is not an exact science and that Practitioner makes no warranties or guarantees regarding treatment outcomes. I acknowledge that I will receive a copy of this consent concurrently upon execution via email to the email address I last provided but may also request a printed copy by calling the office of [place].    I understand that my insurance will be billed for this visit.   I have read or had this consent read to me.  I understand the contents of this consent, which adequately explains the benefits and risks of the Services being provided via telemedicine.   I have been provided ample opportunity to ask questions regarding this consent and the Services and have had my questions answered to my satisfaction.  I give my informed consent for the services to be provided through the use of telemedicine in my medical care  By participating in this telemedicine visit I agree to the above.-YES PT GAVE VERBAL CONSENT TO TREAT AS WELL AS CONSENT TO TREAT SENT TO HER MYCHART TO REVIEW

## 2018-08-19 ENCOUNTER — Telehealth: Payer: Self-pay

## 2018-08-19 NOTE — Telephone Encounter (Signed)
Called pt and advised her to have BP, HR, weight and current med list available prior to her appt time with Dr Nelson on 7/14 

## 2018-08-20 ENCOUNTER — Encounter: Payer: Self-pay | Admitting: *Deleted

## 2018-08-20 ENCOUNTER — Telehealth (INDEPENDENT_AMBULATORY_CARE_PROVIDER_SITE_OTHER): Payer: BC Managed Care – PPO | Admitting: Cardiology

## 2018-08-20 ENCOUNTER — Other Ambulatory Visit: Payer: Self-pay

## 2018-08-20 ENCOUNTER — Encounter: Payer: Self-pay | Admitting: Cardiology

## 2018-08-20 VITALS — BP 120/78 | HR 85 | Wt 180.0 lb

## 2018-08-20 DIAGNOSIS — E785 Hyperlipidemia, unspecified: Secondary | ICD-10-CM | POA: Diagnosis not present

## 2018-08-20 DIAGNOSIS — I5032 Chronic diastolic (congestive) heart failure: Secondary | ICD-10-CM

## 2018-08-20 DIAGNOSIS — I10 Essential (primary) hypertension: Secondary | ICD-10-CM

## 2018-08-20 DIAGNOSIS — R002 Palpitations: Secondary | ICD-10-CM

## 2018-08-20 MED ORDER — FUROSEMIDE 20 MG PO TABS: ORAL_TABLET | ORAL | 8 refills | Status: DC

## 2018-08-20 MED ORDER — DILTIAZEM HCL 30 MG PO TABS: 30.0000 mg | ORAL_TABLET | Freq: Four times a day (QID) | ORAL | 4 refills | Status: DC | PRN

## 2018-08-20 NOTE — Progress Notes (Signed)
Virtual Visit via Video Note   This visit type was conducted due to national recommendations for restrictions regarding the COVID-19 Pandemic (e.g. social distancing) in an effort to limit this patient's exposure and mitigate transmission in our community.  Due to her co-morbid illnesses, this patient is at least at moderate risk for complications without adequate follow up.  This format is felt to be most appropriate for this patient at this time.  All issues noted in this document were discussed and addressed.  A limited physical exam was performed with this format.  Please refer to the patient's chart for her consent to telehealth for [facility].   Date:  08/20/2018   ID:  Karen CottonBeverly B Wadas, DOB 12-07-1960, MRN [id number]  Patient Location: Home Provider Location: Home  PCP:  Dianne DunAron, Talia M, MD  Cardiologist:  Tobias AlexanderKatarina Kel Senn, MD  Electrophysiologist:  None   Evaluation Performed:  Follow-Up Visit  Chief Complaint:  palpitations  History of Present Illness:    Karen Barnes is a 58 y.o. female with h/o diabetes, hypertension, hyperlipidemia, palpitations, prior subdural hematoma (2/2 to a mechanical fall in 01/2017), asthma, strong family hx of CAD and AFib.  She has not been able to tolerate beta-blockers in the past due to asthma, but her PVCs have been controlled on Diltiazem.  She was last seen by Manson PasseyBhavinkumar Bhagat, PA-C 04/19/17 for worsening palpitations that were somewhat prolonged.  An event monitor was arranged.    The patient's chest pain syndrome likely is related to reflux which may have been aggravated by the intercurrent addition of Cardizem for palpitations.  This was chosen as opposed to beta-blockers because of a history of allergies for which she has an EpiPen.  30-day event monitor from March 2019 that showed PACs PVCs and episodes of atrial tachycardia.  Since Dr. Graciela HusbandsKlein discontinued Cardizem she has had several episodes of PVCs mostly at night and when she is  tired but she tolerates them well.  She would rather not be started on another medication unless necessary.  Significant stressors -taking care of her mother with dementia.    She underwent right-sided cardiac catheterization in October 2019 that showed no evidence of pulmonary arterial hypertension and only mild pulmonary hypertension most probably secondary to diastolic heart failure.   She has been dealing with bad asthma for which she is on steroids now and taper, her hemoglobin A1c has been elevated and she also underwent biopsy for thyroid nodule.    She is very motivated to get better, she walks 5 times a week for 2 miles and reports no shortness of breath or chest pain with that.  She continues to have mild lower extremity edema, she is very strict with low-sodium diet and cooks at home.  Her goal is to lose 30 pounds and continue exercising.    Her stressors in life are taking care of her elderly parents were recently both moved to a nursing home.  08/19/2018 -6 months follow-up, she has been doing great, her biggest problem this spring was her asthma for which she was on several rounds of steroids and antibiotics.  She was now started on Dupixent and therapy vest for bronchiectasis and feels significantly better.  She has occasional palpitations only maybe once or twice a months second lasting.  She did not need to take Cardizem.  She takes Lasix fairly frequently and develops mild edema around her ankles when she does not.  She has not been able to exercise because of  her asthma.  The patient does not have symptoms concerning for COVID-19 infection (fever, chills, cough, or new shortness of breath).    Past Medical History:  Diagnosis Date  . Asthma, moderate persistent    sees Dr Harold Hedge  . Diabetes mellitus    A1C 6.2---->  2009  . Family history of adverse reaction to anesthesia    sister had cardiac arrest with rhinoplasty  . Fibrocystic breast   . GERD (gastroesophageal  reflux disease)   . HH (hiatus hernia) 09/2014   Dr. [name]  . History of echocardiogram    Echo 5/19: EF 55-60, normal wall motion, PASP 41  . History of hiatal hernia   . History of nuclear stress test    Nuclear stress test 5/19:  EF 64, normal perfusion, Low risk  . Hot flashes   . Hyperlipidemia   . Normal nuclear stress test 2006  . Palpitations    PACs sees cards   . Recurrent cystitis    took  postcoital antibiotic for years, symptoms better after her hysterectomy 1996, symptoms re-surface in 2011  . RUQ abdominal pain 2016   EGD showed small HH, otherwise normal  . Wears glasses    Past Surgical History:  Procedure Laterality Date  . ABDOMINAL HYSTERECTOMY  1996   no oophorectomy, d/tendometriosis and fibroids approximately 1996  . BREAST EXCISIONAL BIOPSY Right 03/12/2014   [facility]  . BREAST LUMPECTOMY WITH RADIOACTIVE SEED LOCALIZATION Right 03/12/2014   Procedure: BREAST LUMPECTOMY WITH RADIOACTIVE SEED LOCALIZATION;  Surgeon: Erroll Luna, MD;  Location: [place];  Service: General;  Laterality: Right;  . BREAST SURGERY     Lumpectomy-- BENIGN   . CATARACT EXTRACTION Bilateral 2011  . CESAREAN SECTION    . COSMETIC SURGERY  01/2017   MonaLisa Touch w/ GYN  . DILATION AND CURETTAGE OF UTERUS    . LIPOMA EXCISION  1978  . RIGHT HEART CATH N/A 11/26/2017   Procedure: RIGHT HEART CATH;  Surgeon: Larey Dresser, MD;  Location: [place] [facility];  Service: Cardiovascular;  Laterality: N/A;  . UPPER GASTROINTESTINAL ENDOSCOPY    . UPPER GI ENDOSCOPY  09/11/2014   Small Hiatus Hernia, Dr. [name]     Current Meds  Medication Sig  . albuterol (PROVENTIL HFA;VENTOLIN HFA) 108 (90 BASE) MCG/ACT inhaler Inhale 2 puffs into the lungs every 6 (six) hours as needed.  Marland Kitchen albuterol (PROVENTIL) (2.5 MG/3ML) 0.083% nebulizer solution Take 2.5 mg by nebulization every 6 (six) hours as needed for wheezing or shortness of breath.  Marland Kitchen aspirin EC 81 MG tablet Take 81 mg by  mouth daily.  . bifidobacterium infantis (ALIGN) capsule Take 1 capsule by mouth daily.  . budesonide-formoterol (SYMBICORT) 160-4.5 MCG/ACT inhaler Inhale 2 puffs into the lungs 2 (two) times daily.  . Calcium Carbonate-Vitamin D (CALCIUM 600+D) 600-200 MG-UNIT TABS Take 1 tablet by mouth daily.  . Continuous Blood Gluc Receiver (FREESTYLE LIBRE 14 DAY READER) DEVI USE AS DIRECTED  . Continuous Blood Gluc Sensor (FREESTYLE LIBRE 14 DAY SENSOR) MISC USE AS DIRECTED PER MD  . Continuous Blood Gluc Sensor (FREESTYLE LIBRE SENSOR SYSTEM) MISC CHECK BLOOD SUGAR DAILY AS DIRECTED. CHANGE DEVICE EVERY 10 DAYS  . diltiazem (CARDIZEM) 30 MG tablet Take 1 tablet (30 mg total) by mouth every 6 (six) hours as needed (for palpitations).  . Dupilumab (DUPIXENT) 300 MG/2ML SOSY Inject 300 mg into the skin every 14 (fourteen) days.  Marland Kitchen EPINEPHrine (AUVI-Q) 0.3 mg/0.3 mL IJ SOAJ injection  Inject 0.3 mg into the muscle as directed. For immunotherapy (allergy shots) anaphylactic reactions  . fexofenadine (ALLEGRA) 180 MG tablet Take 180 mg by mouth every morning.   . fluticasone (FLONASE) 50 MCG/ACT nasal spray Place 2 sprays into both nostrils 2 (two) times daily.   . furosemide (LASIX) 20 MG tablet TAKE 1 TABLET(20 MG) BY MOUTH DAILY AS NEEDED FOR FLUID RETENTION OR SWELLING  . hydrocortisone 2.5 % cream APPLY EXTERNALLY TO THE AFFECTED AREA TWICE DAILY AS NEEDED (Patient taking differently: Apply 1 application topically 2 (two) times daily as needed (for skin irritation/rash). )  . metFORMIN (GLUCOPHAGE) 1000 MG tablet Take 1 tablet by mouth 2 (two) times daily.  . montelukast (SINGULAIR) 10 MG tablet Take 10 mg by mouth at bedtime.    . Multiple Vitamins-Minerals (MULTIVITAMIN WITH MINERALS) tablet Take 1 tablet by mouth daily.  . nitrofurantoin, macrocrystal-monohydrate, (MACROBID) 100 MG capsule TAKE AS DIRECTED POST COITAL (Patient taking differently: Take 100 mg by mouth daily as needed (post coital). )  .  omeprazole (PRILOSEC) 40 MG capsule Take 40 mg by mouth at bedtime.  . ondansetron (ZOFRAN-ODT) 4 MG disintegrating tablet DISSOLVE 1 TABLET BY MOUTH EVERY 8 HOURS AS NEEDED FOR NAUSEA OR VOMITING  . pantoprazole (PROTONIX) 40 MG tablet TAKE 1 TABLET(40 MG) BY MOUTH DAILY  . PRESCRIPTION MEDICATION Allergy Shots  . rosuvastatin (CRESTOR) 5 MG tablet TAKE 1 TABLET(5 MG) BY MOUTH DAILY  . sodium chloride HYPERTONIC 3 % nebulizer solution Take by nebulization 2 (two) times daily.  Marland Kitchen. SPIRIVA RESPIMAT 1.25 MCG/ACT AERS INHALE 2 PUFF BY MOUTH EVERY DAY     Allergies:   Butorphanol tartrate, Hydrocodone, Roxicet [oxycodone-acetaminophen], and Penicillins   Social History   Tobacco Use  . Smoking status: Never Smoker  . Smokeless tobacco: Never Used  Substance Use Topics  . Alcohol use: No  . Drug use: No     Family Hx: The patient's family history includes Brain cancer in an other family member; Breast cancer (age of onset: 6360) in her maternal aunt; Breast cancer (age of onset: 1363) in her mother; CAD (age of onset: 7940) in her paternal grandfather and paternal grandmother; Dementia in her mother; Diabetes in her father; Heart disease (age of onset: 2340) in her father; Kidney failure in her father; Lung cancer in an other family member; Mitral valve prolapse in her mother; Sleep apnea in her father. There is no history of Colon cancer.  ROS:   Please see the history of present illness.    All other systems reviewed and are negative.   Prior CV studies:   The following studies were reviewed today:   Labs/Other Tests and Data Reviewed:    EKG:  No ECG reviewed.  Recent Labs: 11/26/2017: Hemoglobin 11.0; Platelets 292 12/11/2017: ALT 15; BUN 21; Creatinine, Ser 0.99; Potassium 3.9; Sodium 142; TSH 1.46   Recent Lipid Panel Lab Results  Component Value Date/Time   CHOL 166 10/03/2017 11:33 AM   TRIG 78.0 10/03/2017 11:33 AM   HDL 75.30 10/03/2017 11:33 AM   CHOLHDL 2 10/03/2017 11:33  AM   LDLCALC 75 10/03/2017 11:33 AM    Wt Readings from Last 3 Encounters:  08/20/18 180 lb (81.6 kg)  07/22/18 183 lb 12.8 oz (83.4 kg)  05/30/18 178 lb (80.7 kg)     Objective:    Vital Signs:  BP 120/78   Pulse 85   Wt 180 lb (81.6 kg)   BMI 30.90 kg/m    VITAL  SIGNS:  reviewed   ASSESSMENT & PLAN:    1.  Palpitations 30-day event monitor showed PACs PVCs and atrial tachycardias.  She is allergic to metoprolol and also has severe asthma so I would avoid that, she was taken off Cardizem now only on as needed Cardizem her symptoms minimal.  In the future if her symptoms get worse will consider flecainide that was also suggested by Dr. Graciela HusbandsKlein She was evaluated for sleep apnea and it was negative. In the last 6 months she has not had to use Cardizem, her symptoms are minimal.  2.  Pulmonary hypertension was noted on her echocardiogram, RVSP 41 mmHg, her left ventricle systolic and diastolic function is normal, this is most probably related to her severe asthma, her RV has normal size and function.  3.  Hyperlipidemia, on statins that is very tolerated.  Most recent lipids in August 2019 were all at goal.  Will repeat prior to the next visit.  4.  Lower extremity edema, continue Lasix 20 mg daily as needed, she uses it fairly frequently.  We will refill.  5.  Chronic persistent asthma, now on Dupixent and therapy vest with significant improvement, she is encouraged to start exercising.  We will follow-up in 6 months with labs prior to that appointment including CMP, CBC, lipids and TSH.  COVID-19 Education: The signs and symptoms of COVID-19 were discussed with the patient and how to seek care for testing (follow up with PCP or arrange E-visit).  The importance of social distancing was discussed today.  Time:   Today, I have spent 25 minutes with the patient with telehealth technology discussing the above problems.     Medication Adjustments/Labs and Tests Ordered: Current  medicines are reviewed at length with the patient today.  Concerns regarding medicines are outlined above.   Tests Ordered: No orders of the defined types were placed in this encounter.   Medication Changes: No orders of the defined types were placed in this encounter.   Follow Up:  Virtual Visit or In Person in 6 month(s)  Signed, Tobias AlexanderKatarina Elsye Mccollister, MD  08/20/2018 9:53 AM    [facility]

## 2018-08-20 NOTE — Patient Instructions (Signed)
Medication Instructions:    Your physician recommends that you continue on your current medications as directed. Please refer to the Current Medication list given to you today.  If you need a refill on your cardiac medications before your next appointment, please call your pharmacy.    Lab work:  PRIOR TO YOUR 6 MONTH FOLLOW-UP APPOINTMENT WITH DR NELSON--WE WILL SCHEDULE THIS CLOSER TO THAT TIME FRAME--WE WILL CHECK A CMET, CBC W DIFF, TSH, AND LIPIDS--PLEASE COME FASTING TO THIS LAB APPOINTMENT, MEANING NOTHING TO EAT OR DRINK AFTER MIDNIGHT THE NIGHT BEFORE EXCEPT FOR YOU MAY HAVE WATER WITH YOUR MEDICINES.  If you have labs (blood work) drawn today and your tests are completely normal, you will receive your results only by: Marland Kitchen MyChart Message (if you have MyChart) OR . A paper copy in the mail If you have any lab test that is abnormal or we need to change your treatment, we will call you to review the results.    Follow-Up: At [facility], you and your health needs are our priority.  As part of our continuing mission to provide you with exceptional heart care, we have created designated Provider Care Teams.  These Care Teams include your primary Cardiologist (physician) and Advanced Practice Providers (APPs -  Physician Assistants and Nurse Practitioners) who all work together to provide you with the care you need, when you need it.  Your physician wants you to follow-up in: [place] will receive a reminder letter in the mail two months in advance. If you don't receive a letter, please call our office to schedule the follow-up appointment.  YOU WILL NEED YOUR LABS ARRANGED A COUPLE DAYS PRIOR TO THIS APPOINTMENT

## 2018-08-23 ENCOUNTER — Ambulatory Visit: Payer: BC Managed Care – PPO

## 2018-08-30 ENCOUNTER — Telehealth: Payer: Self-pay

## 2018-08-30 NOTE — Telephone Encounter (Signed)
Questions for Screening COVID-19  Symptom onset: None  Travel or Contacts: None  During this illness, did/does the patient experience any of the following symptoms? Fever >100.24F []   Yes [x]   No []   Unknown Subjective fever (felt feverish) []   Yes [x]   No []   Unknown Chills []   Yes [x]   No []   Unknown Muscle aches (myalgia) []   Yes [x]   No []   Unknown Runny nose (rhinorrhea) []   Yes [x]   No []   Unknown Sore throat []   Yes [x]   No []   Unknown Cough (new onset or worsening of chronic cough) []   Yes [x]   No []   Unknown Shortness of breath (dyspnea) []   Yes [x]   No []   Unknown Nausea or vomiting []   Yes [x]   No []   Unknown Headache []   Yes [x]   No []   Unknown Abdominal pain  []   Yes [x]   No []   Unknown Diarrhea (?3 loose/looser than normal stools/24hr period) []   Yes [x]   No []   Unknown Other, specify:  Patient risk factors: Smoker? []   Current []   Former []   Never If female, currently pregnant? []   Yes []   No  Patient Active Problem List   Diagnosis Date Noted  . Bronchiectasis without complication ([place]) 02/14/3233  . On corticosteroid therapy 05/29/2018  . Nausea 05/29/2018  . Bronchiectasis with acute exacerbation ([place]) 05/16/2018  . Thyroid nodule 11/08/2017  . Low TSH level 11/08/2017  . Edema of both ankles 02/03/2016  . 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 OSA (obstructive sleep apnea) 02/03/2016  . Lung nodule 11/03/2015  . PCP NOTES >>>>>>>>>>>>>>>>>>>>>>>>>>>>>>>>>>> 03/26/2015  . Family history of breast cancer 02/23/2012  . At risk for coronary artery disease 02/07/2012  . Obesity 12/13/2010  . Heart palpitations 12/13/2010  . Elevated blood pressure reading 12/13/2010  . Hyperlipidemia 03/07/2007  . [facility] 03/07/2007  . CYSTITIS, RECURRENT 03/07/2007  . DMII (diabetes mellitus, type 2) ([place]) 03/07/2007  . Asthmatic bronchitis with acute exacerbation 07/12/2006  . GERD 07/12/2006    Plan:  []   High risk for COVID-19 with red flags go to ED (with CP, SOB,  weak/lightheaded, or fever > 101.5). Call ahead.  []   High risk for COVID-19 but stable. Inform provider and coordinate time for [facility] visit.   []   No red flags but URI signs or symptoms okay for [facility] visit.

## 2018-09-01 DIAGNOSIS — R600 Localized edema: Secondary | ICD-10-CM | POA: Insufficient documentation

## 2018-09-01 NOTE — Progress Notes (Signed)
Subjective:   Patient ID: Karen Barnes, female    DOB: [birth year], 58 y.o.   MRN: [id number]  Karen Barnes is a pleasant 58 y.o. year old female who presents to clinic today with Follow-up (Pt screened at vehicle. She is here today for a 26-month-F/U. On 2.17.20 her A1C came down to 6.4 from 6.8. There are orders for a Mammogram in Dec. Thyroid U/S completed on 6.3.20. Future orders for labs from Cards for a CMP/CBC/TSH/Lipid.)  on 09/02/2018  HPI: Karen Barnes is a very pleasant 58 y.o. female with h/o diabetes, hypertension, hyperlipidemia, bronchiectasis, palpitations, asthma, recurrent CP, strong family hx of CAD and AFib here for 6 month follow up.  She is doing very well.  DM- currently taking Metformin 1000 mg twice daily. Denies any episodes of hypoglycemia.  a1c went down to 6.4 from 6/8 in 03/2018.  She expects it will have gone up as she has been on steroids again.   Lab Results  Component Value Date   HGBA1C 6.4 (A) 03/25/2018   HGBA1C 6.4 03/25/2018   Followed by cardiology, Dr. Delton See.  Last saw her via televisit on 08/20/18.  Note reviewed. She has not been able to tolerate beta blockers in the past due to asthma and she takes an epi pen.  PVCs have been controlled on diltiazem.  It is felt that her chest pain is related to reflux aggravated by intercurrent additional of diltiazem for palpitations.  She was last seen by [name], PA-C 04/19/17 for worsening palpitations that were somewhat prolonged.  A 30 day event monitor showed PACs PVCs and episodes of atrial tachycardia.  Since Dr. Graciela Husbands discontinued Cardizem she has had several episodes of PVCs mostly at night and when she is tired but she tolerates them well.  She would rather not be started on another medication unless necessary.  Significant stressors -taking care of her mother with dementia.   Lab Results  Component Value Date   CHOL 166 10/03/2017   HDL 75.30 10/03/2017   LDLCALC 75 10/03/2017   TRIG 78.0 10/03/2017   CHOLHDL 2 10/03/2017   She underwent right-sided cardiac catheterization in October 2019 that showed no evidence of pulmonary arterial hypertension and only mild pulmonary hypertension most probably secondary to diastolic heart failure.    HLD- on crestor 5 mg daily.  Lab Results  Component Value Date   CHOL 166 10/03/2017   HDL 75.30 10/03/2017   LDLCALC 75 10/03/2017   TRIG 78.0 10/03/2017   CHOLHDL 2 10/03/2017       Edema- She continues to have mild lower extremity edema, she is very strict with low-sodium diet and cooks at home.  Her goal is to lose 30 pounds and continue exercising. She takes Lasix fairly frequently for this.  Her stressors in life are taking care of her elderly parents were recently both moved to a nursing home.  Bronchiectasis- followed by [facility].  Was last seen last seen by Philipp Deputy on 07/30/18.  Note reviewed.    She has been on several rounds of steroids and antibiotics.  She was now started on Dupixent and therapy vest for bronchiectasis and feels significantly better. Both have been very helpful. Current Outpatient Medications on File Prior to Visit  Medication Sig Dispense Refill  . albuterol (PROVENTIL HFA;VENTOLIN HFA) 108 (90 BASE) MCG/ACT inhaler Inhale 2 puffs into the lungs every 6 (six) hours as needed.    Marland Kitchen albuterol (PROVENTIL) (2.5 MG/3ML) 0.083% nebulizer solution Take  2.5 mg by nebulization every 6 (six) hours as needed for wheezing or shortness of breath.    Marland Kitchen. aspirin EC 81 MG tablet Take 81 mg by mouth daily.    . bifidobacterium infantis (ALIGN) capsule Take 1 capsule by mouth daily.    . budesonide-formoterol (SYMBICORT) 160-4.5 MCG/ACT inhaler Inhale 2 puffs into the lungs 2 (two) times daily.    . Calcium Carbonate-Vitamin D (CALCIUM 600+D) 600-200 MG-UNIT TABS Take 1 tablet by mouth daily.    . Continuous Blood Gluc Receiver (FREESTYLE LIBRE 14 DAY READER) DEVI USE AS DIRECTED 1 Device 0  .  Continuous Blood Gluc Sensor (FREESTYLE LIBRE 14 DAY SENSOR) MISC USE AS DIRECTED PER MD 2 each 5  . Continuous Blood Gluc Sensor (FREESTYLE LIBRE SENSOR SYSTEM) MISC CHECK BLOOD SUGAR DAILY AS DIRECTED. CHANGE DEVICE EVERY 10 DAYS 3 each 12  . diltiazem (CARDIZEM) 30 MG tablet Take 1 tablet (30 mg total) by mouth every 6 (six) hours as needed (for palpitations). 30 tablet 4  . Dupilumab (DUPIXENT) 300 MG/2ML SOSY Inject 300 mg into the skin every 14 (fourteen) days. 4 mL 11  . EPINEPHrine (AUVI-Q) 0.3 mg/0.3 mL IJ SOAJ injection Inject 0.3 mg into the muscle as directed. For immunotherapy (allergy shots) anaphylactic reactions    . fexofenadine (ALLEGRA) 180 MG tablet Take 180 mg by mouth every morning.     . fluticasone (FLONASE) 50 MCG/ACT nasal spray Place 2 sprays into both nostrils 2 (two) times daily.     . furosemide (LASIX) 20 MG tablet TAKE 1 TABLET(20 MG) BY MOUTH DAILY AS NEEDED FOR FLUID RETENTION OR SWELLING 30 tablet 8  . hydrocortisone 2.5 % cream APPLY EXTERNALLY TO THE AFFECTED AREA TWICE DAILY AS NEEDED (Patient taking differently: Apply 1 application topically 2 (two) times daily as needed (for skin irritation/rash). ) 30 g 0  . metFORMIN (GLUCOPHAGE) 1000 MG tablet Take 1 tablet by mouth 2 (two) times daily.    . montelukast (SINGULAIR) 10 MG tablet Take 10 mg by mouth at bedtime.      . Multiple Vitamins-Minerals (MULTIVITAMIN WITH MINERALS) tablet Take 1 tablet by mouth daily.    . nitrofurantoin, macrocrystal-monohydrate, (MACROBID) 100 MG capsule TAKE AS DIRECTED POST COITAL (Patient taking differently: Take 100 mg by mouth daily as needed (post coital). ) 30 capsule 0  . omeprazole (PRILOSEC) 40 MG capsule Take 40 mg by mouth at bedtime.    . ondansetron (ZOFRAN-ODT) 4 MG disintegrating tablet DISSOLVE 1 TABLET BY MOUTH EVERY 8 HOURS AS NEEDED FOR NAUSEA OR VOMITING 30 tablet 5  . pantoprazole (PROTONIX) 40 MG tablet TAKE 1 TABLET(40 MG) BY MOUTH DAILY 90 tablet 3  .  PRESCRIPTION MEDICATION Allergy Shots    . rosuvastatin (CRESTOR) 5 MG tablet TAKE 1 TABLET(5 MG) BY MOUTH DAILY 90 tablet 3  . sodium chloride HYPERTONIC 3 % nebulizer solution Take by nebulization 2 (two) times daily. 300 mL 11  . SPIRIVA RESPIMAT 1.25 MCG/ACT AERS INHALE 2 PUFF BY MOUTH EVERY DAY 4 g 11   No current facility-administered medications on file prior to visit.     Allergies  Allergen Reactions  . Butorphanol Tartrate Other (See Comments)    REACTION: hallucinations  . Hydrocodone Other (See Comments)    HALLUCINATIONS  . Roxicet [Oxycodone-Acetaminophen] Other (See Comments)    HALLUCINATIONS  . Penicillins Hives    Occurred at age 35/CHILDHOOD ALLERGY Has patient had a PCN reaction causing immediate rash, facial/tongue/throat swelling, SOB or lightheadedness with hypotension:  Unknown Has patient had a PCN reaction causing severe rash involving mucus membranes or skin necrosis: Unknown Has patient had a PCN reaction that required hospitalization: Unknown Has patient had a PCN reaction occurring within the last 10 years: No If all of the above answers are "NO", then may proceed with Cephalosporin use.     Past Medical History:  Diagnosis Date  . Asthma, moderate persistent    sees Dr Irena CordsVan Winkle  . Diabetes mellitus    A1C 6.2---->  2009  . Family history of adverse reaction to anesthesia    sister had cardiac arrest with rhinoplasty  . Fibrocystic breast   . GERD (gastroesophageal reflux disease)   . HH (hiatus hernia) 09/2014   Dr. Ewing SchleinMagod  . History of echocardiogram    Echo 5/19: EF 55-60, normal wall motion, PASP 41  . History of hiatal hernia   . History of nuclear stress test    Nuclear stress test 5/19:  EF 64, normal perfusion, Low risk  . Hot flashes   . Hyperlipidemia   . Normal nuclear stress test 2006  . Palpitations    PACs sees cards   . Recurrent cystitis    took  postcoital antibiotic for years, symptoms better after her hysterectomy 1996,  symptoms re-surface in 2011  . RUQ abdominal pain 2016   EGD showed small HH, otherwise normal  . Wears glasses     Past Surgical History:  Procedure Laterality Date  . ABDOMINAL HYSTERECTOMY  1996   no oophorectomy, d/tendometriosis and fibroids approximately 1996  . BREAST EXCISIONAL BIOPSY Right 03/12/2014   [facility]  . BREAST LUMPECTOMY WITH RADIOACTIVE SEED LOCALIZATION Right 03/12/2014   Procedure: BREAST LUMPECTOMY WITH RADIOACTIVE SEED LOCALIZATION;  Surgeon: Harriette Bouillonhomas Cornett, MD;  Location: [facility];  Service: General;  Laterality: Right;  . BREAST SURGERY     Lumpectomy-- BENIGN   . CATARACT EXTRACTION Bilateral 2011  . CESAREAN SECTION    . COSMETIC SURGERY  01/2017   MonaLisa Touch w/ GYN  . DILATION AND CURETTAGE OF UTERUS    . LIPOMA EXCISION  1978  . RIGHT HEART CATH N/A 11/26/2017   Procedure: RIGHT HEART CATH;  Surgeon: Laurey MoraleMcLean, Dalton S, MD;  [facility];  Service: Cardiovascular;  Laterality: N/A;  . UPPER GASTROINTESTINAL ENDOSCOPY    . UPPER GI ENDOSCOPY  09/11/2014   Small Hiatus Hernia, Dr. Ewing SchleinMagod    Family History  Problem Relation Age of Onset  . Mitral valve prolapse Mother   . Breast cancer Mother 8163       M had ca insitu (age 60), aunt   . Dementia Mother   . Heart disease Father 6240       early   . Diabetes Father   . Kidney failure Father   . Sleep apnea Father   . Brain cancer Other        2 fam members   . Lung cancer Other        uncle, heavy smoker  . Breast cancer Maternal Aunt 60  . CAD Paternal Grandmother 6640  . CAD Paternal Grandfather 3140  . Colon cancer Neg Hx     Social History   Socioeconomic History  . Marital status: Married    Spouse name: Not on file  . Number of children: 1  . Years of education: College  . Highest education level: Not on file  Occupational History  . Occupation: retired principal  Engineer, productionocial Needs  .  Financial resource strain: Not on file  . Food insecurity    Worry: Not on  file    Inability: Not on file  . Transportation needs    Medical: Not on file    Non-medical: Not on file  Tobacco Use  . Smoking status: Never Smoker  . Smokeless tobacco: Never Used  Substance and Sexual Activity  . Alcohol use: No  . Drug use: No  . Sexual activity: Yes  Lifestyle  . Physical activity    Days per week: Not on file    Minutes per session: Not on file  . Stress: Not on file  Relationships  . Social Musicianconnections    Talks on phone: Not on file    Gets together: Not on file    Attends religious service: Not on file    Active member of club or organization: Not on file    Attends meetings of clubs or organizations: Not on file    Relationship status: Not on file  . Intimate partner violence    Fear of current or ex partner: Not on file    Emotionally abused: Not on file    Physically abused: Not on file    Forced sexual activity: Not on file  Other Topics Concern  . Not on file  Social History Narrative   Household-- pt and husband    Rare caffeine use   Daughter independent, 1 G-child   Engineer, structuralCaregiver for F and M, both w/ dementia   The PMH, PSH, Social History, Family History, Medications, and allergies [facility], and have been updated if relevant.   Review of Systems  Respiratory: Negative.   Cardiovascular: Negative.   Endocrine: Negative.   Genitourinary: Negative.   Musculoskeletal: Negative.   Allergic/Immunologic: Negative.   Neurological: Negative.   Hematological: Negative.   Psychiatric/Behavioral: Negative.   All other systems reviewed and are negative.      Objective:    BP 128/78 (BP Location: Left Arm, Patient Position: Sitting, Cuff Size: Normal)   Pulse 82   Temp 97.9 F (36.6 C) (Oral)   Ht 5\' 4"  (1.626 m)   Wt 185 lb 6.4 oz (84.1 kg)   SpO2 97%   BMI 31.82 kg/m   Wt Readings from Last 3 Encounters:  09/02/18 185 lb 6.4 oz (84.1 kg)  08/20/18 180 lb (81.6 kg)  07/22/18 183 lb 12.8 oz (83.4 kg)     Physical Exam   General:  Well-developed,well-nourished,in no acute distress; alert,appropriate and cooperative throughout examination Head:  normocephalic and atraumatic.   Eyes:  vision grossly intact, PERRL Ears:  R ear normal and L ear normal externally, TMs clear bilaterally Nose:  no external deformity.   Mouth:  good dentition.   Neck:  No deformities, masses, or tenderness noted. Lungs:  Normal respiratory effort, chest expands symmetrically. Lungs are clear to auscultation, no crackles or wheezes. Heart:  Normal rate and regular rhythm. S1 and S2 normal without gallop, murmur, click, rub or other extra sounds. Msk:  No deformity or scoliosis noted of thoracic or lumbar spine.   Extremities:  No clubbing, cyanosis, edema, or deformity noted with normal full range of motion of all joints.   Neurologic:  alert & oriented X3 and gait normal.   Skin:  Intact without suspicious lesions or rashes Psych:  Cognition and judgment appear intact. Alert and cooperative with normal attention span and concentration. No apparent delusions, illusions, hallucinations        Assessment &  Plan:   Type 2 diabetes mellitus without complication, unspecified whether long term insulin use ([place]) - Plan: HgB A1c, Urine Microalbumin w/creat. ratio, Hemoglobin A1c, Lipid panel, TSH, Comprehensive metabolic panel, Microalbumin / creatinine urine ratio,  Asthmatic bronchitis with acute exacerbation, unspecified asthma severity, unspecified whether persistent - Plan:  Hyperlipidemia, unspecified hyperlipidemia type - Plan: Lipid panel, TSH, Comprehensive metabolic panel,   Bronchiectasis with acute exacerbation ([place]) - Plan:   Bronchiectasis without complication ([place]) - Plan:  Low TSH level - Plan: T4, free,  Edema of both ankles - Plan:  Bilateral leg edema - Plan:  No follow-ups on file.

## 2018-09-02 ENCOUNTER — Ambulatory Visit: Payer: BC Managed Care – PPO | Admitting: Family Medicine

## 2018-09-02 ENCOUNTER — Encounter: Payer: Self-pay | Admitting: Family Medicine

## 2018-09-02 VITALS — BP 128/78 | HR 82 | Temp 97.9°F | Ht 64.0 in | Wt 185.4 lb

## 2018-09-02 DIAGNOSIS — J479 Bronchiectasis, uncomplicated: Secondary | ICD-10-CM

## 2018-09-02 DIAGNOSIS — E119 Type 2 diabetes mellitus without complications: Secondary | ICD-10-CM | POA: Diagnosis not present

## 2018-09-02 DIAGNOSIS — M25472 Effusion, left ankle: Secondary | ICD-10-CM

## 2018-09-02 DIAGNOSIS — E785 Hyperlipidemia, unspecified: Secondary | ICD-10-CM | POA: Diagnosis not present

## 2018-09-02 DIAGNOSIS — J471 Bronchiectasis with (acute) exacerbation: Secondary | ICD-10-CM | POA: Diagnosis not present

## 2018-09-02 DIAGNOSIS — R600 Localized edema: Secondary | ICD-10-CM

## 2018-09-02 DIAGNOSIS — R002 Palpitations: Secondary | ICD-10-CM

## 2018-09-02 DIAGNOSIS — M25471 Effusion, right ankle: Secondary | ICD-10-CM

## 2018-09-02 DIAGNOSIS — I5032 Chronic diastolic (congestive) heart failure: Secondary | ICD-10-CM

## 2018-09-02 DIAGNOSIS — I10 Essential (primary) hypertension: Secondary | ICD-10-CM

## 2018-09-02 DIAGNOSIS — R7989 Other specified abnormal findings of blood chemistry: Secondary | ICD-10-CM | POA: Diagnosis not present

## 2018-09-02 DIAGNOSIS — J45901 Unspecified asthma with (acute) exacerbation: Secondary | ICD-10-CM

## 2018-09-02 LAB — COMPREHENSIVE METABOLIC PANEL
ALT: 14 U/L (ref 0–35)
AST: 14 U/L (ref 0–37)
Albumin: 4.4 g/dL (ref 3.5–5.2)
Alkaline Phosphatase: 63 U/L (ref 39–117)
BUN: 12 mg/dL (ref 6–23)
CO2: 29 mEq/L (ref 19–32)
Calcium: 9.5 mg/dL (ref 8.4–10.5)
Chloride: 101 mEq/L (ref 96–112)
Creatinine, Ser: 0.76 mg/dL (ref 0.40–1.20)
GFR: 77.65 mL/min (ref >60.00)
Glucose, Bld: 127 mg/dL — ABNORMAL HIGH (ref 70–99)
Potassium: 3.7 mEq/L (ref 3.5–5.1)
Sodium: 141 mEq/L (ref 135–145)
Total Bilirubin: 0.3 mg/dL (ref 0.2–1.2)
Total Protein: 6.9 g/dL (ref 6.0–8.3)

## 2018-09-02 LAB — LIPID PANEL
Cholesterol: 160 mg/dL (ref 0–200)
HDL: 64.2 mg/dL (ref >39.00)
LDL Cholesterol: 74 mg/dL (ref 0–99)
NonHDL: 95.41
Total CHOL/HDL Ratio: 2
Triglycerides: 107 mg/dL (ref 0.0–149.0)
VLDL: 21.4 mg/dL (ref 0.0–40.0)

## 2018-09-02 LAB — HEMOGLOBIN A1C
Hgb A1c MFr Bld: 7.6 % — ABNORMAL HIGH (ref 4.6–6.5)
Hgb A1c MFr Bld: 7.6 % — ABNORMAL HIGH (ref 4.6–6.5)

## 2018-09-02 LAB — TSH: TSH: 1.78 u[IU]/mL (ref 0.35–4.50)

## 2018-09-02 LAB — MICROALBUMIN / CREATININE URINE RATIO
Creatinine,U: 24.4 mg/dL
Microalb Creat Ratio: 2.9 mg/g (ref 0.0–30.0)
Microalb, Ur: <0.7 mg/dL (ref 0.0–1.9)

## 2018-09-02 LAB — T4, FREE: Free T4: 1.02 ng/dL (ref 0.60–1.60)

## 2018-09-02 NOTE — Assessment & Plan Note (Signed)
Doing well.  Followed by Dr. Meda Coffee.  These have been well controlled on as needed diltiazem which lately, she has rarely taken.

## 2018-09-02 NOTE — Assessment & Plan Note (Signed)
Improved with Dupixent and therapy vest.  She feels much better. Followed by pulmonary.

## 2018-09-02 NOTE — Assessment & Plan Note (Signed)
Has been on steroids so we expect her a1c to have increased.  Symptomatically, she is doing well.  Continue current dose of metformin and check labs today. On statin.  Check urine micro.

## 2018-09-02 NOTE — Assessment & Plan Note (Signed)
Repeat thyroid function today.

## 2018-09-02 NOTE — Patient Instructions (Signed)
Great to see you. I will call you with your lab results from today and you can view them online.   Happy Birthday!!!   Have a great time on vacation!

## 2018-09-02 NOTE — Assessment & Plan Note (Signed)
Due for labs.  Has been well controlled on crestor 5 mg daily. Orders Placed This Encounter  Procedures  . HgB A1c  . Urine Microalbumin w/creat. ratio  . Hemoglobin A1c  . Lipid panel  . TSH  . Comprehensive metabolic panel  . Microalbumin / creatinine urine ratio  . T4, free

## 2018-09-03 LAB — CBC WITH DIFFERENTIAL/PLATELET
Basophils Absolute: 0 10*3/uL (ref 0.0–0.2)
Basos: 1 %
EOS (ABSOLUTE): 0.2 10*3/uL (ref 0.0–0.4)
Eos: 3 %
Hematocrit: 38 % (ref 34.0–46.6)
Hemoglobin: 11.8 g/dL (ref 11.1–15.9)
Immature Grans (Abs): 0.1 10*3/uL (ref 0.0–0.1)
Immature Granulocytes: 2 %
Lymphocytes Absolute: 1.4 10*3/uL (ref 0.7–3.1)
Lymphs: 27 %
MCH: 25 pg — ABNORMAL LOW (ref 26.6–33.0)
MCHC: 31.1 g/dL — ABNORMAL LOW (ref 31.5–35.7)
MCV: 81 fL (ref 79–97)
Monocytes Absolute: 0.4 10*3/uL (ref 0.1–0.9)
Monocytes: 8 %
Neutrophils Absolute: 3.1 10*3/uL (ref 1.4–7.0)
Neutrophils: 59 %
Platelets: 304 10*3/uL (ref 150–450)
RBC: 4.72 x10E6/uL (ref 3.77–5.28)
RDW: 14.1 % (ref 11.7–15.4)
WBC: 5.2 10*3/uL (ref 3.4–10.8)

## 2018-09-05 ENCOUNTER — Ambulatory Visit: Payer: BC Managed Care – PPO

## 2018-09-09 ENCOUNTER — Ambulatory Visit: Payer: BC Managed Care – PPO | Admitting: Family Medicine

## 2018-09-27 ENCOUNTER — Encounter: Payer: Self-pay | Admitting: Family Medicine

## 2018-10-01 ENCOUNTER — Encounter: Payer: Self-pay | Admitting: Family Medicine

## 2018-10-01 ENCOUNTER — Other Ambulatory Visit: Payer: Self-pay | Admitting: Cardiology

## 2018-10-26 ENCOUNTER — Encounter: Payer: Self-pay | Admitting: Family Medicine

## 2018-10-31 ENCOUNTER — Encounter: Payer: Self-pay | Admitting: Pulmonary Disease

## 2018-10-31 ENCOUNTER — Other Ambulatory Visit: Payer: Self-pay

## 2018-10-31 ENCOUNTER — Ambulatory Visit (INDEPENDENT_AMBULATORY_CARE_PROVIDER_SITE_OTHER): Payer: BC Managed Care – PPO | Admitting: Pulmonary Disease

## 2018-10-31 DIAGNOSIS — J4550 Severe persistent asthma, uncomplicated: Secondary | ICD-10-CM | POA: Insufficient documentation

## 2018-10-31 DIAGNOSIS — J471 Bronchiectasis with (acute) exacerbation: Secondary | ICD-10-CM

## 2018-10-31 DIAGNOSIS — J4551 Severe persistent asthma with (acute) exacerbation: Secondary | ICD-10-CM | POA: Diagnosis not present

## 2018-10-31 MED ORDER — PREDNISONE 10 MG PO TABS: ORAL_TABLET | ORAL | 0 refills | Status: DC

## 2018-10-31 MED ORDER — DOXYCYCLINE HYCLATE 100 MG PO TABS: 100.0000 mg | ORAL_TABLET | Freq: Two times a day (BID) | ORAL | 0 refills | Status: DC

## 2018-10-31 NOTE — Patient Instructions (Addendum)
You were seen today by Lauraine Rinne, NP  for:   1. Bronchiectasis with acute exacerbation (HCC)  - doxycycline (VIBRA-TABS) 100 MG tablet; Take 1 tablet (100 mg total) by mouth 2 (two) times daily.  Dispense: 20 tablet; Refill: 0 - predniSONE (DELTASONE) 10 MG tablet; 4 tabs for 2 days, then 3 tabs for 2 days, 2 tabs for 2 days, then 1 tab for 2 days, then stop  Dispense: 20 tablet; Refill: 0  Bronchiectasis: This is the medical term which indicates that you have damage, dilated airways making you more susceptible to respiratory infection.  Use a flutter valve 10 breaths twice a day or 4 to 5 breaths 4-5 times a day to help clear mucus out Let us know if you have cough with change in mucus color or fevers or chills. At that point you would need an antibiotic. Maintain a healthy nutritious diet, eating whole foods Take your medications as prescribed  Continue Respitechtherapy vest - 2x daily, can increase to 3x daily if needed   ContinueMucinex 600mg  daily   Continue Hypertonic saline as ordered  Continue allegra daily  Continue Singulair daily  Continue Flonase  2. Severe persistent asthma with exacerbation  Continue Symbicort160 >>> 2 puffs in the morning right when you wake up, rinse out your mouth after use, 12 hours later 2 puffs, rinse after use >>> Take this daily, no matter what >>> This is not a rescue inhaler  ContinueSpiriva Respimat 1.25 >>> 2 puffs daily >>> Do this every day >>>This is not a rescue inhaler  Continue Dupixent   - predniSONE (DELTASONE) 10 MG tablet; 4 tabs for 2 days, then 3 tabs for 2 days, 2 tabs for 2 days, then 1 tab for 2 days, then stop  Dispense: 20 tablet; Refill: 0   Contact our office if temperatures worsen or symptoms worsen   We recommend today:  No orders of the defined types were placed in this encounter.  No orders of the defined types were placed in this encounter.  Meds ordered this encounter  Medications  .  doxycycline (VIBRA-TABS) 100 MG tablet    Sig: Take 1 tablet (100 mg total) by mouth 2 (two) times daily.    Dispense:  20 tablet    Refill:  0  . predniSONE (DELTASONE) 10 MG tablet    Sig: 4 tabs for 2 days, then 3 tabs for 2 days, 2 tabs for 2 days, then 1 tab for 2 days, then stop    Dispense:  20 tablet    Refill:  0    Follow Up:     Return in about 4 weeks (around 11/28/2018), or if symptoms worsen or fail to improve, for Follow up with Dr. [name].   Please do your part to reduce the spread of COVID-19:      Reduce your risk of any infection  and COVID19 by using the similar precautions used for avoiding the common cold or flu:  Marland Kitchen Wash your hands often with soap and warm water for at least 20 seconds.  If soap and water are not readily available, use an alcohol-based hand sanitizer with at least 60% alcohol.  . If coughing or sneezing, cover your mouth and nose by coughing or sneezing into the elbow areas of your shirt or coat, into a tissue or into your sleeve (not your hands). Langley Gauss A MASK when in public  . Avoid shaking hands with others and consider head nods or verbal greetings  only. . Avoid touching your eyes, nose, or mouth with unwashed hands.  . Avoid close contact with people who are sick. . Avoid places or events with large numbers of people in one location, like concerts or sporting events. . If you have some symptoms but not all symptoms, continue to monitor at home and seek medical attention if your symptoms worsen. . If you are having a medical emergency, call 911.   ADDITIONAL HEALTHCARE OPTIONS FOR PATIENTS  [facility] / e-Visit: [email or web address]         [facility]: [phone]  [facility]: [phone]                   [facility]: [phone]     It is flu season:   >>> Best ways to protect herself from the flu: Receive the yearly flu vaccine,  practice good hand hygiene washing with soap and also using hand sanitizer when available, eat a nutritious meals, get adequate rest, hydrate appropriately   Please contact the office if your symptoms worsen or you have concerns that you are not improving.   Thank you for choosing [facility] for your healthcare, and for allowing [place] to partner with you on your healthcare journey. I am thankful to be able to provide care to you today.   [name] FNP-C

## 2018-10-31 NOTE — Assessment & Plan Note (Signed)
Assessment: October/2019 high-resolution CT chest shows mild cylindrical bronchiectasis Patient reports clinical improvement since starting therapy vest Patient continues to take Mucinex daily Patient is managed on hypertonic saline, Symbicort 160, Spiriva Respimat 1.25, albuterol nebulized medications, Mucinex  Plan: Doxycycline today Prednisone taper today Continue hypertonic saline Continue Symbicort 160 Continue therapy vest Continue Spiriva Respimat 1.25 Continue daily align

## 2018-10-31 NOTE — Progress Notes (Signed)
Virtual Visit via Telephone Note  I connected with Karen Barnes on 10/31/18 at 11:00 AM EDT by telephone and verified that I am speaking with the correct person using two identifiers.  Location: Patient: Home Provider: [facility] - [id number], [place], [place], [place] [address]   I discussed the limitations, risks, security and privacy concerns of performing an evaluation and management service by telephone and the availability of in person appointments. I also discussed with the patient that there may be a patient responsible charge related to this service. The patient expressed understanding and agreed to proceed.  Patient consented to consult via telephone: Yes People present and their role in pt care: Pt    History of Present Illness:  58 year old female never smoker, first seen in 2017 for asthma and a pulmonary nodule She has severe GERD and is followed by Dr. Watt Barnes.  She started on immunotherapy in 2017 She was born prematurely at 68 weeks.   Maintenance:  Symbicort 160, Spiriva Resp 2.5, Dupixent  Patient of Dr. Lake Barnes  Chief complaint: Cough   58 year old female never smoker followed in our office for severe persistent eosinophilic asthma and bronchiectasis.  Patient contacted our office on 10/31/2018 reporting that she is had increased cough, wheezing as well as sinus headache and drainage starting over the last 48 hours.  Unfortunately patient is on a vacation at the beach.  She is at [facility].  She did bring her vest with her.  She has been using her therapy vest as well as all of her medications as prescribed.  She is using her rescue inhaler 2 times daily.  7 is significant amount of nasal drainage as well as some discolored nasal drainage.  Observations/Objective:  10/31/2018 - temp - 98.9  Chest imaging November 07, 2017 high-resolution CT scan of the chest images : Mild cylindrical bronchiectasis particularly in the left lower  lobe, some in the right lower lobe 10/18/2015 chest CT images  showing normal pulmonary parenchyma with the exception of a 3.4 mm nodule in the left lower lobe.  Pulmonary function test: October 2019 ratio 90%, FVC 2.56 L 76% predicted, total lung capacity 4.32 L 84% predicted, DLCO 20.7 mL 84% predicted  Labs: October 2019 complement levels within normal limits, IgG subclasses all normal, alpha-1 antitrypsin level normal, CBC showed 300 absolute eosinophil count  11/03/2015 - Ige - 32  Heart catheterization: October 2019 right heart catheterization showed mean PA pressure 24, pulmonary capillary wedge pressure 19, PVR 0.7 [place] units   Assessment and Plan:  Bronchiectasis with acute exacerbation [facility]) Assessment: October/2019 high-resolution CT chest shows mild cylindrical bronchiectasis Patient reports clinical improvement since starting therapy vest Patient continues to take Mucinex daily Patient is managed on hypertonic saline, Symbicort 160, Spiriva Respimat 1.25, albuterol nebulized medications, Mucinex  Plan: Doxycycline today Prednisone taper today Continue hypertonic saline Continue Symbicort 160 Continue therapy vest Continue Spiriva Respimat 1.25 Continue daily align   Severe persistent asthma with exacerbation Plan: Doxycycline today Prednisone taper today Continue Symbicort 160 Continue Spiriva Respimat 1.25 Continue Dupixent injections Continue allergy injections We will coordinate 4-week follow-up in office to establish with Dr. [name]  Follow Up Instructions:  Return in about 4 weeks (around 11/28/2018), or if symptoms worsen or fail to improve, for Follow up with Dr. [name].   I discussed the assessment and treatment plan with the patient. The patient was provided an opportunity to ask questions and all were answered. The patient  agreed with the plan and demonstrated an understanding of the instructions.   The patient was advised to call back or  seek an in-person evaluation if the symptoms worsen or if the condition fails to improve as anticipated.  I provided 26 minutes of non-face-to-face time during this encounter.   Karen Ceo, NP

## 2018-10-31 NOTE — Progress Notes (Signed)
Reviewed, agree 

## 2018-10-31 NOTE — Assessment & Plan Note (Signed)
Plan: Doxycycline today Prednisone taper today Continue Symbicort 160 Continue Spiriva Respimat 1.25 Continue Dupixent injections Continue allergy injections We will coordinate 4-week follow-up in office to establish with Dr. [name]

## 2018-11-12 ENCOUNTER — Encounter: Payer: Self-pay | Admitting: Family Medicine

## 2018-11-17 ENCOUNTER — Other Ambulatory Visit: Payer: Self-pay | Admitting: Pulmonary Disease

## 2018-11-26 ENCOUNTER — Other Ambulatory Visit: Payer: Self-pay | Admitting: Family Medicine

## 2018-11-26 MED ORDER — FREESTYLE LIBRE SENSOR SYSTEM MISC: 12 refills | Status: DC

## 2018-11-26 NOTE — Telephone Encounter (Signed)
Continuous Blood Gluc Sensor (FREESTYLE LIBRE 14 DAY SENSOR) MISC   Send to [facility]

## 2018-11-26 NOTE — Telephone Encounter (Signed)
Requested medication (s) are due for refill today: yes  Requested medication (s) are on the active medication list: yes  Last refill:  09/29/2016  Future visit scheduled: yes  Notes to clinic:  Last filled by different provider   Requested Prescriptions  Pending Prescriptions Disp Refills   Continuous Blood Gluc Sensor (High [place]) Pink 3 each 12    Sig: CHECK BLOOD SUGAR DAILY AS DIRECTED. CHANGE DEVICE EVERY 10 DAYS     There is no refill protocol information for this order

## 2018-11-27 ENCOUNTER — Ambulatory Visit: Payer: BC Managed Care – PPO | Admitting: Pulmonary Disease

## 2018-12-08 ENCOUNTER — Telehealth: Payer: BC Managed Care – PPO | Admitting: Physician Assistant

## 2018-12-08 DIAGNOSIS — H1033 Unspecified acute conjunctivitis, bilateral: Secondary | ICD-10-CM

## 2018-12-08 MED ORDER — ERYTHROMYCIN 5 MG/GM OP OINT: 1.0000 "application " | TOPICAL_OINTMENT | Freq: Four times a day (QID) | OPHTHALMIC | 0 refills | Status: DC

## 2018-12-08 NOTE — Progress Notes (Signed)
We are sorry that you are not feeling well.  Here is how we plan to help!  Based on what you have shared with me it looks like you have conjunctivitis.  Conjunctivitis is a common inflammatory or infectious condition of the eye that is often referred to as "pink eye".  In most cases it is contagious (viral or bacterial). However, not all conjunctivitis requires antibiotics (ex. Allergic).  We have made appropriate suggestions for you based upon your presentation.  I have prescribed erythromycin to use four times daily for 5 days.   Pink eye can be highly contagious.  It is typically spread through direct contact with secretions, or contaminated objects or surfaces that one may have touched.  Strict handwashing is suggested with soap and water is urged.  If not available, use alcohol based had sanitizer.  Avoid unnecessary touching of the eye.  If you wear contact lenses, you will need to refrain from wearing them until you see no white discharge from the eye for at least 24 hours after being on medication.  You should see symptom improvement in 1-2 days after starting the medication regimen.  Call us if symptoms are not improved in 1-2 days.  Home Care:  Wash your hands often!  Do not wear your contacts until you complete your treatment plan.  Avoid sharing towels, bed linen, personal items with a person who has pink eye.  See attention for anyone in your home with similar symptoms.  Get Help Right Away If:  Your symptoms do not improve.  You develop blurred or loss of vision.  Your symptoms worsen (increased discharge, pain or redness)  Your e-visit answers were reviewed by a board certified advanced clinical practitioner to complete your personal care plan.  Depending on the condition, your plan could have included both over the counter or prescription medications.  If there is a problem please reply  once you have received a response from your provider.  Your safety is important to  [place].  If you have drug allergies check your prescription carefully.    You can use MyChart to ask questions about today's visit, request a non-urgent call back, or ask for a work or school excuse for 24 hours related to this e-Visit. If it has been greater than 24 hours you will need to follow up with your provider, or enter a new e-Visit to address those concerns.   You will get an e-mail in the next two days asking about your experience.  I hope that your e-visit has been valuable and will speed your recovery. Thank you for using e-visits.  Greater than 5 minutes, yet less than 10 minutes of time have been spent researching, coordinating, and implementing care for this patient today

## 2018-12-11 ENCOUNTER — Telehealth: Payer: Self-pay | Admitting: *Deleted

## 2018-12-11 ENCOUNTER — Telehealth: Payer: Self-pay | Admitting: Family Medicine

## 2018-12-11 NOTE — Telephone Encounter (Signed)
LMTCB

## 2018-12-11 NOTE — Telephone Encounter (Signed)
Called patient to go over covid screening for appt 11/5. Pt has pinkeye and is on abx eye drops since Sunday. Are you ok to proceed with in-office visit or change to virtual?

## 2018-12-11 NOTE — Telephone Encounter (Signed)
Rescheduled to next week unless she has acute symptoms that we need to address in which case we can make it virtual for tomorrow.

## 2018-12-11 NOTE — Telephone Encounter (Signed)
Patient states she has pink eye and had evisit over the weekend. Patient eye is still itching and would like PCP to call her in something [place], [place] [phone] (Phone) [phone] (Fax)

## 2018-12-12 ENCOUNTER — Ambulatory Visit (INDEPENDENT_AMBULATORY_CARE_PROVIDER_SITE_OTHER): Payer: BC Managed Care – PPO | Admitting: Family Medicine

## 2018-12-12 ENCOUNTER — Telehealth (INDEPENDENT_AMBULATORY_CARE_PROVIDER_SITE_OTHER): Payer: BC Managed Care – PPO | Admitting: Pulmonary Disease

## 2018-12-12 ENCOUNTER — Ambulatory Visit: Payer: BC Managed Care – PPO | Admitting: Pulmonary Disease

## 2018-12-12 DIAGNOSIS — H1033 Unspecified acute conjunctivitis, bilateral: Secondary | ICD-10-CM | POA: Diagnosis not present

## 2018-12-12 DIAGNOSIS — J4551 Severe persistent asthma with (acute) exacerbation: Secondary | ICD-10-CM | POA: Diagnosis not present

## 2018-12-12 DIAGNOSIS — S065XAA Traumatic subdural hemorrhage with loss of consciousness status unknown, initial encounter: Secondary | ICD-10-CM | POA: Insufficient documentation

## 2018-12-12 DIAGNOSIS — S065X9A Traumatic subdural hemorrhage with loss of consciousness of unspecified duration, initial encounter: Secondary | ICD-10-CM | POA: Diagnosis not present

## 2018-12-12 DIAGNOSIS — J479 Bronchiectasis, uncomplicated: Secondary | ICD-10-CM

## 2018-12-12 DIAGNOSIS — E118 Type 2 diabetes mellitus with unspecified complications: Secondary | ICD-10-CM

## 2018-12-12 DIAGNOSIS — H109 Unspecified conjunctivitis: Secondary | ICD-10-CM | POA: Insufficient documentation

## 2018-12-12 MED ORDER — NEOMYCIN-POLYMYXIN-DEXAMETH 0.1 % OP SUSP: 2.0000 [drp] | Freq: Three times a day (TID) | OPHTHALMIC | 0 refills | Status: DC

## 2018-12-12 NOTE — Telephone Encounter (Signed)
Patient had a virtual visit today w/Dr. [name].

## 2018-12-12 NOTE — Patient Instructions (Signed)
I am glad you are doing well with regard to your breathing Continue with the Dupixent injections and inhalers  We will make a follow-up visit on December 18 to meet in person Please call sooner if there are any issues with your breathing.

## 2018-12-12 NOTE — Progress Notes (Signed)
Virtual Visit via Telephone Note  I connected with Karen Barnes on 12/23/18 at  3:30 PM EST by telephone and verified that I am speaking with the correct person using two identifiers.  Location: Patient: Home Provider: [facility], [place], [place]   I discussed the limitations, risks, security and privacy concerns of performing an evaluation and management service by telephone and the availability of in person appointments. I also discussed with the patient that there may be a patient responsible charge related to this service. The patient expressed understanding and agreed to proceed.   History of Present Illness: Follow-up for asthma, bronchiectasis  58 year old with history of asthma, bronchiectasis immunotherapy with Dupixent started in 2019 Maintained on Symbicort, Spiriva. She has severe GERD and is followed by Dr. [name].  She was born prematurely at 36 weeks Works as a Pharmacist, hospital in a private school.  She is doing person classes at present.   Observations/Objective: In September for asthma, bronchiectasis exacerbation which was treated with doxycycline and prednisone.  Currently dealing with conjunctivitis.  She has received eyedrops and antibiotics from her primary care States that her breathing is doing well with no issues. Continues on inhalers She gets Dupixent at home and has been trained for home injection.  Data: Imaging: November 07, 2017 high-resolution CT scan of the chest images : Mild cylindrical bronchiectasis particularly in the left lower lobe, some in the right lower lobe.  10/18/2015 chest CT images showing normal pulmonary parenchyma with the exception of a 3.4 mm nodule in the left lower lobe.  Pulmonary function test: October 2019 ratio 90%, FVC 2.56 L 76% predicted, total lung capacity 4.32 L 84% predicted, DLCO 20.7 mL 84% predicted  Labs: October 2019 complement levels within normal limits, IgG subclasses all normal, alpha-1  antitrypsin level normal, CBC showed 300 absolute eosinophil count  11/03/2015 - Ige - 32  Heart catheterization: October 2019 right heart catheterization showed mean PA pressure 24, pulmonary capillary wedge pressure 19, PVR 0.7 Woods units  Assessment and Plan: Bronchiectasis Continue chest vest, Mucinex.  Hypertonic saline for ciliary clearance  Severe persistent asthma Continue Symbicort, Spiriva, Dupixent  Follow Up Instructions: -Continue current therapy without change Arrange follow-up visit in person in the next month.   I discussed the assessment and treatment plan with the patient. The patient was provided an opportunity to ask questions and all were answered. The patient agreed with the plan and demonstrated an understanding of the instructions.   The patient was advised to call back or seek an in-person evaluation if the symptoms worsen or if the condition fails to improve as anticipated.  I provided 25 minutes of non-face-to-face time during this encounter.   Marshell Garfinkel MD [facility] 12/23/2018, 8:20 AM

## 2018-12-12 NOTE — Progress Notes (Signed)
Virtual Visit via Video   Due to the COVID-19 pandemic, this visit was completed with telemedicine (audio/video) technology to reduce patient and provider exposure as well as to preserve personal protective equipment.   I connected with Karen Barnes by a video enabled telemedicine application and verified that I am speaking with the correct person using two identifiers. Location patient: Home Location provider: [facility], Office Persons participating in the virtual visit: Adolph Pollack, MD   I discussed the limitations of evaluation and management by telemedicine and the availability of in person appointments. The patient expressed understanding and agreed to proceed.  Care Team   Patient Care Team: Dianne Dun, MD as PCP - General (Family Medicine) Lars Masson, MD as PCP - Cardiology (Cardiology) Harriette Bouillon, MD as Consulting Physician (General Surgery) Laurey Morale, MD as Consulting Physician (Cardiology) Irena Cords, Enzo Montgomery, MD (Allergy and Immunology) Celso Amy, PA-C as Physician Assistant (Gastroenterology) Vida Rigger, MD as Consulting Physician (Gastroenterology) Zelphia Cairo, MD as Consulting Physician (Obstetrics and Gynecology) Aura Camps, MD as Consulting Physician (Ophthalmology)  Subjective:   HPI:   Conjunctivitis- She had an evisit with Eyvonne Mechanic, PA-C for conjunctivitis OU 4 days and was Tx with Erythromycin qid.  She was starting to feel but until yesterday yesterday afternoon.  She feels that due to the Dupixant injection this saturday that she is not able to get rid of the conjunctivitis   Eyes this morning were mattered shut again this morning.  No eye pain.   Eyes still red.  Itchy, no photophobia.  Feels now actually symptoms back to what they were like prior to getting the erythromycin ointment.   Review of Systems  Constitutional: Negative.   Eyes: Positive for discharge and redness.  Negative for blurred vision, double vision, photophobia and pain.  Respiratory: Negative.   Cardiovascular: Negative.   Neurological: Negative.   Endo/Heme/Allergies: Negative.   Psychiatric/Behavioral: Negative.   All other systems reviewed and are negative.    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   OSA (obstructive sleep apnea) 02/03/2016   Lung nodule 11/03/2015   PCP NOTES >>>>>>>>>>>>>>>>>>>>>>>>>>>>>>>>>>> 03/26/2015   Family history of breast cancer 02/23/2012   At risk for coronary artery disease 02/07/2012   Obesity 12/13/2010   Heart palpitations 12/13/2010   Elevated blood pressure reading 12/13/2010   Hyperlipidemia 03/07/2007   PAC 03/07/2007   CYSTITIS, RECURRENT 03/07/2007   DMII (diabetes mellitus, type 2) (HCC) 03/07/2007   Asthmatic bronchitis with acute exacerbation 07/12/2006   GERD 07/12/2006    Social History   Tobacco Use   Smoking status: Never Smoker   Smokeless tobacco: Never Used  Substance Use Topics   Alcohol use: No    Current Outpatient Medications:    albuterol (PROVENTIL HFA;VENTOLIN HFA) 108 (90 BASE) MCG/ACT inhaler, Inhale 2 puffs into the lungs every 6 (six) hours as needed., Disp: , Rfl:    albuterol (PROVENTIL) (2.5 MG/3ML) 0.083% nebulizer solution, Take 2.5 mg by nebulization every 6 (six) hours as needed for wheezing or shortness of breath., Disp: , Rfl:    aspirin EC 81 MG  tablet, Take 81 mg by mouth daily., Disp: , Rfl:    bifidobacterium infantis (ALIGN) capsule, Take 1 capsule by mouth daily., Disp: , Rfl:     budesonide-formoterol (SYMBICORT) 160-4.5 MCG/ACT inhaler, Inhale 2 puffs into the lungs 2 (two) times daily., Disp: , Rfl:    Calcium Carbonate-Vitamin D (CALCIUM 600+D) 600-200 MG-UNIT TABS, Take 1 tablet by mouth daily., Disp: , Rfl:    Continuous Blood Gluc Receiver (FREESTYLE LIBRE 14 DAY READER) DEVI, USE AS DIRECTED, Disp: 1 Device, Rfl: 0   Continuous Blood Gluc Sensor (FREESTYLE LIBRE 14 DAY SENSOR) MISC, USE AS DIRECTED PER MD, Disp: 2 each, Rfl: 5   Continuous Blood Gluc Sensor (FREESTYLE LIBRE SENSOR SYSTEM) MISC, CHECK BLOOD SUGAR DAILY AS DIRECTED. CHANGE DEVICE EVERY 10 DAYS, Disp: 3 each, Rfl: 12   diltiazem (CARDIZEM) 30 MG tablet, Take 1 tablet (30 mg total) by mouth every 6 (six) hours as needed (for palpitations)., Disp: 30 tablet, Rfl: 4   doxycycline (VIBRA-TABS) 100 MG tablet, Take 1 tablet (100 mg total) by mouth 2 (two) times daily., Disp: 20 tablet, Rfl: 0   Dupilumab (DUPIXENT) 300 MG/2ML SOSY, Inject 300 mg into the skin every 14 (fourteen) days., Disp: 4 mL, Rfl: 11   EPINEPHrine (AUVI-Q) 0.3 mg/0.3 mL IJ SOAJ injection, Inject 0.3 mg into the muscle as directed. For immunotherapy (allergy shots) anaphylactic reactions, Disp: , Rfl:    erythromycin ophthalmic ointment, Place 1 application into both eyes 4 (four) times daily., Disp: 3.5 g, Rfl: 0   fexofenadine (ALLEGRA) 180 MG tablet, Take 180 mg by mouth every morning. , Disp: , Rfl:    fluticasone (FLONASE) 50 MCG/ACT nasal spray, Place 2 sprays into both nostrils 2 (two) times daily. , Disp: , Rfl:    furosemide (LASIX) 20 MG tablet, TAKE 1 TABLET(20 MG) BY MOUTH DAILY AS NEEDED FOR FLUID RETENTION OR SWELLING, Disp: 30 tablet, Rfl: 8   hydrocortisone 2.5 % cream, APPLY EXTERNALLY TO THE AFFECTED AREA TWICE DAILY AS NEEDED (Patient taking differently: Apply 1 application topically 2 (two) times daily as needed (for skin irritation/rash). ), Disp: 30 g, Rfl: 0   metFORMIN (GLUCOPHAGE) 1000 MG tablet, Take 1  tablet by mouth 2 (two) times daily., Disp: , Rfl:    montelukast (SINGULAIR) 10 MG tablet, Take 10 mg by mouth at bedtime.  , Disp: , Rfl:    Multiple Vitamins-Minerals (MULTIVITAMIN WITH MINERALS) tablet, Take 1 tablet by mouth daily., Disp: , Rfl:    nitrofurantoin, macrocrystal-monohydrate, (MACROBID) 100 MG capsule, TAKE AS DIRECTED POST COITAL (Patient taking differently: Take 100 mg by mouth daily as needed (post coital). ), Disp: 30 capsule, Rfl: 0   omeprazole (PRILOSEC) 40 MG capsule, Take 40 mg by mouth at bedtime., Disp: , Rfl:    ondansetron (ZOFRAN-ODT) 4 MG disintegrating tablet, DISSOLVE 1 TABLET BY MOUTH EVERY 8 HOURS AS NEEDED FOR NAUSEA OR VOMITING, Disp: 30 tablet, Rfl: 5   pantoprazole (PROTONIX) 40 MG tablet, TAKE 1 TABLET(40 MG) BY MOUTH DAILY, Disp: 90 tablet, Rfl: 3   predniSONE (DELTASONE) 10 MG tablet, 4 tabs for 2 days, then 3 tabs for 2 days, 2 tabs for 2 days, then 1 tab for 2 days, then stop, Disp: 20 tablet, Rfl: 0   PRESCRIPTION MEDICATION, Allergy Shots, Disp: , Rfl:    rosuvastatin (CRESTOR) 5 MG tablet, TAKE 1 TABLET(5 MG) BY MOUTH DAILY, Disp: 90 tablet, Rfl: 1   sodium chloride HYPERTONIC 3 % nebulizer solution, INHALE CONTENTS OF 1 VIAL  VIA NEBULIZER TWICE DAILY AS DIRECTED., Disp: 240 mL, Rfl: 0   SPIRIVA RESPIMAT 1.25 MCG/ACT AERS, INHALE 2 PUFF BY MOUTH EVERY DAY, Disp: 4 g, Rfl: 11  Allergies  Allergen Reactions   Butorphanol Tartrate Other (See Comments)    REACTION: hallucinations   Hydrocodone Other (See Comments)    HALLUCINATIONS   Roxicet [Oxycodone-Acetaminophen] Other (See Comments)    HALLUCINATIONS   Penicillins Hives    Occurred at age 66/CHILDHOOD ALLERGY Has patient had a PCN reaction causing immediate rash, facial/tongue/throat swelling, SOB or lightheadedness with hypotension: Unknown Has patient had a PCN reaction causing severe rash involving mucus membranes or skin necrosis: Unknown Has patient had a PCN reaction that  required hospitalization: Unknown Has patient had a PCN reaction occurring within the last 10 years: No If all of the above answers are "NO", then may proceed with Cephalosporin use.     Objective:  :  Patient appears well, vitals signs are normal. Eyes: both eyes with findings of typical conjunctivitis noted; erythema and discharge. PERRLA, no foreign body noted. No periorbital cellulitis. The corneas are clear and fundi normal. Visual acuity normal.     Dep[facility] 10/03/2017 09/26/2016 09/24/2015  Decreased Interest 0 0 0  Down, Depressed, Hopeless 0 0 0  PHQ - 2 Score 0 0 0  Some recent data might be hidden    Assessment and Plan:   There are no diagnoses linked to this encounter.   COVID-19 Education: The signs and symptoms of COVID-19 were discussed with the patient and how to seek care for testing if needed. The importance of social distancing was discussed today.  Reviewed expectations re: course of current medical issues.  Discussed self-management of symptoms.  Outlined signs and symptoms indicating need for more acute intervention.  Patient verbalized understanding and all questions were answered.  Health Maintenance issues including appropriate healthy diet, exercise, and smoking avoidance were discussed with patient.  See orders for this visit as documented in the electronic medical record.  Ruthe Mannanalia Noelle Hoogland, MD  Records requested if needed. Time spent: 15 minutes, of which >50% was spent in obtaining information about her symptoms, reviewing her previous labs, evaluations, and treatments, counseling her about her condition (please see the discussed topics above), and developing a plan to further investigate it; she had a number of questions which I addressed.

## 2018-12-12 NOTE — Assessment & Plan Note (Signed)
D/c erythromycin ointment.  eRx sent in for neomycin-polymyxin-dexamethasone drops given her complicated situation. I do feel she needs to add an a topical steroid.   Hygiene discussed. If other family members develop same condition, may use same medication for them if they are not known to be allergic to it. Call prn.  The patient indicates understanding of these issues and agrees with the plan.

## 2018-12-19 ENCOUNTER — Telehealth: Payer: Self-pay | Admitting: Pulmonary Disease

## 2018-12-19 NOTE — Telephone Encounter (Signed)
Received a fax from [place]. Pt's Dupixent PA has expired. PA form has been filled out, signed and faxed back to [facility] along with clinical documentation. Will await PA decision.

## 2018-12-23 ENCOUNTER — Encounter: Payer: Self-pay | Admitting: Pulmonary Disease

## 2018-12-25 ENCOUNTER — Other Ambulatory Visit: Payer: Self-pay

## 2018-12-25 MED ORDER — METFORMIN HCL 1000 MG PO TABS: 1000.0000 mg | ORAL_TABLET | Freq: Two times a day (BID) | ORAL | 1 refills | Status: DC

## 2018-12-25 NOTE — Telephone Encounter (Signed)
Last OV 12/12/2018 Last fill 03/10/18 Historical provider

## 2018-12-26 ENCOUNTER — Other Ambulatory Visit: Payer: Self-pay | Admitting: Pulmonary Disease

## 2018-12-27 NOTE — Telephone Encounter (Signed)
ATC Patient.  Left detailed message on VM (DPR), we have not heard anything on her Dupixent PA, but I will contact [facility], and will follow up with Patient when I have updates.

## 2018-12-27 NOTE — Telephone Encounter (Signed)
Called [facility], spoke with [place]. Jasmine stated clinical notes stating Patient has improved with Dupixent injections. Jasmine stated new Dupixent PA through [facility] can be started for continuing Dupixent injections.  CMM key # [id number]  Dupixent PA submitted through CMM. Will follow up.

## 2018-12-27 NOTE — Telephone Encounter (Signed)
Patient calling states she received letter stating that [place] is denied because we didn't send the additional clinical info in the time allowed and a new PA needs to be started - pt would like a call back [phone]

## 2018-12-31 NOTE — Telephone Encounter (Signed)
Pt following up regarding authorization and clinica data that is required that has to be submitted to insurance to continue [place].  Was needing medication for shot on Saturday.  Please advise.  [phone].

## 2019-01-07 DIAGNOSIS — U071 COVID-19: Secondary | ICD-10-CM

## 2019-01-07 HISTORY — DX: COVID-19: U07.1

## 2019-01-07 NOTE — Telephone Encounter (Signed)
Attempted to check status of PA via CMM.  Site says that I must call insurance company to follow up further. Called [employer], who advised that since the pt has a state health plan her PA's are covered through [place]. Called [facility], rep advised that pt's Dupixent PA has been approved from 12/28/2018-12/28/2019.  Per rep, they have already spoken with patient and her delivery of med is scheduled.  Called patient to ensure that this has been set up.  Pt verified that she received an approval letter with the same dates that were given to me, and verified that she has received a shipment of her [place].  Nothing further needed at this time- will close encounter.

## 2019-01-23 ENCOUNTER — Encounter: Payer: Self-pay | Admitting: Pulmonary Disease

## 2019-01-23 ENCOUNTER — Other Ambulatory Visit: Payer: Self-pay

## 2019-01-23 ENCOUNTER — Telehealth: Payer: Self-pay | Admitting: Pulmonary Disease

## 2019-01-23 ENCOUNTER — Ambulatory Visit: Payer: BC Managed Care – PPO | Admitting: Pulmonary Disease

## 2019-01-23 VITALS — BP 122/80 | HR 70 | Temp 97.0°F | Ht 64.0 in | Wt 183.0 lb

## 2019-01-23 DIAGNOSIS — J479 Bronchiectasis, uncomplicated: Secondary | ICD-10-CM

## 2019-01-23 DIAGNOSIS — J4551 Severe persistent asthma with (acute) exacerbation: Secondary | ICD-10-CM

## 2019-01-23 NOTE — Telephone Encounter (Signed)
Called and spoke to pt. Informed her per Dr. [name] she would be fine to come in due to her negative rapid test. Pt verbalized understanding and states she will keep appt. Nothing further needed at this time.

## 2019-01-23 NOTE — Telephone Encounter (Signed)
Dr. [name],   I called the patient back and she stated she found out this afternoon that her co-worker and the co-workers husband tested positive for covid.  However, she (the patient) did go to visit her mother today in a nursing facility and a rapid test was done which was negative.  Patient stated she knows that Dr. [name] wanted to see her based on the last appointment, which was a video visit. Patient wants to know if she should keep the appointment? (I told her we can make it a video visit), or should it be rescheduled. Please advise. Thank you.

## 2019-01-23 NOTE — Patient Instructions (Signed)
Am glad you are doing well with regard to your breathing Continue the Dupixent injections and inhalers as prescribed Follow-up in 6 months

## 2019-01-23 NOTE — Progress Notes (Signed)
Karen Barnes    [id number]    1960-03-04  Primary Care Physician:Aron, Bryn Gulling, MD  Referring Physician: Dianne Dun, MD [address] [place],  [place] [address]  Chief complaint:   Follow-up for asthma, bronchiectasis  HPI: 58 year old with history of asthma, bronchiectasis immunotherapy with Dupixent started in 2019 Maintained on Symbicort, Spiriva. She has severe GERD and is followed by Dr. Ewing Schlein.  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  She is doing person classes at present.  Interim history: States that she is doing well with no issues.  Continues with Dupixent therapy and inhalers, Singulair She is getting regular allergy shots with Dr. Madie Reno  Outpatient Encounter Medications as of 01/23/2019  Medication Sig  . albuterol (PROVENTIL HFA;VENTOLIN HFA) 108 (90 BASE) MCG/ACT inhaler Inhale 2 puffs into the lungs every 6 (six) hours as needed.  Marland Kitchen aspirin EC 81 MG tablet Take 81 mg by mouth daily.  . bifidobacterium infantis (ALIGN) capsule Take 1 capsule by mouth daily.  . budesonide-formoterol (SYMBICORT) 160-4.5 MCG/ACT inhaler Inhale 2 puffs into the lungs 2 (two) times daily.  . Calcium Carbonate-Vitamin D (CALCIUM 600+D) 600-200 MG-UNIT TABS Take 1 tablet by mouth daily.  . Continuous Blood Gluc Receiver (FREESTYLE LIBRE 14 DAY READER) DEVI USE AS DIRECTED  . Continuous Blood Gluc Sensor (FREESTYLE LIBRE 14 DAY SENSOR) MISC USE AS DIRECTED PER MD  . Dupilumab (DUPIXENT) 300 MG/2ML SOSY Inject 300 mg into the skin every 14 (fourteen) days.  Marland Kitchen EPINEPHrine (AUVI-Q) 0.3 mg/0.3 mL IJ SOAJ injection Inject 0.3 mg into the muscle as directed. For immunotherapy (allergy shots) anaphylactic reactions  . erythromycin ophthalmic ointment Place 1 application into both eyes 4 (four) times daily.  . fexofenadine (ALLEGRA) 180 MG tablet Take 180 mg by mouth every morning.   . fluticasone (FLONASE) 50 MCG/ACT nasal spray  Place 2 sprays into both nostrils 2 (two) times daily.   . furosemide (LASIX) 20 MG tablet TAKE 1 TABLET(20 MG) BY MOUTH DAILY AS NEEDED FOR FLUID RETENTION OR SWELLING  . hydrocortisone 2.5 % cream APPLY EXTERNALLY TO THE AFFECTED AREA TWICE DAILY AS NEEDED (Patient taking differently: Apply 1 application topically 2 (two) times daily as needed (for skin irritation/rash). )  . metFORMIN (GLUCOPHAGE) 1000 MG tablet Take 1 tablet (1,000 mg total) by mouth 2 (two) times daily.  . montelukast (SINGULAIR) 10 MG tablet Take 10 mg by mouth at bedtime.    . Multiple Vitamins-Minerals (MULTIVITAMIN WITH MINERALS) tablet Take 1 tablet by mouth daily.  Marland Kitchen neomycin-polymyxin-dexamethasone (MAXITROL) 0.1 % ophthalmic suspension Place 2 drops into both eyes 3 (three) times daily.  . nitrofurantoin, macrocrystal-monohydrate, (MACROBID) 100 MG capsule TAKE AS DIRECTED POST COITAL (Patient taking differently: Take 100 mg by mouth daily as needed (post coital). )  . omeprazole (PRILOSEC) 40 MG capsule Take 40 mg by mouth at bedtime.  . ondansetron (ZOFRAN-ODT) 4 MG disintegrating tablet DISSOLVE 1 TABLET BY MOUTH EVERY 8 HOURS AS NEEDED FOR NAUSEA OR VOMITING  . pantoprazole (PROTONIX) 40 MG tablet TAKE 1 TABLET(40 MG) BY MOUTH DAILY  . PRESCRIPTION MEDICATION Allergy Shots  . rosuvastatin (CRESTOR) 5 MG tablet TAKE 1 TABLET(5 MG) BY MOUTH DAILY  . sodium chloride HYPERTONIC 3 % nebulizer solution INHALE CONTENTS OF 1 VIAL VIA NEBULIZER TWICE DAILY AS DIRECTED.  Marland Kitchen SPIRIVA RESPIMAT 1.25 MCG/ACT AERS INHALE 2 PUFF BY MOUTH EVERY DAY  . [DISCONTINUED] diltiazem (  CARDIZEM) 30 MG tablet Take 1 tablet (30 mg total) by mouth every 6 (six) hours as needed (for palpitations). (Patient not taking: Reported on 12/12/2018)   No facility-administered encounter medications on file as of 01/23/2019.    Allergies as of 01/23/2019 - Review Complete 01/23/2019  Allergen Reaction Noted  . Butorphanol tartrate Other (See Comments)  07/02/2009  . Hydrocodone Other (See Comments) 03/06/2014  . Roxicet [oxycodone-acetaminophen] Other (See Comments) 08/16/2014  . Penicillins Hives 03/07/2007    Past Medical History:  Diagnosis Date  . Asthma, moderate persistent    sees Dr Irena CordsVan Winkle  . Diabetes mellitus    A1C 6.2---->  2009  . Family history of adverse reaction to anesthesia    sister had cardiac arrest with rhinoplasty  . Fibrocystic breast   . GERD (gastroesophageal reflux disease)   . HH (hiatus hernia) 09/2014   Dr. Ewing SchleinMagod  . History of echocardiogram    Echo 5/19: EF 55-60, normal wall motion, PASP 41  . History of hiatal hernia   . History of nuclear stress test    Nuclear stress test 5/19:  EF 64, normal perfusion, Low risk  . Hot flashes   . Hyperlipidemia   . Normal nuclear stress test 2006  . Palpitations    PACs sees cards   . Recurrent cystitis    took  postcoital antibiotic for years, symptoms better after her hysterectomy 1996, symptoms re-surface in 2011  . RUQ abdominal pain 2016   EGD showed small HH, otherwise normal  . Wears glasses     Past Surgical History:  Procedure Laterality Date  . ABDOMINAL HYSTERECTOMY  1996   no oophorectomy, d/tendometriosis and fibroids approximately 1996  . BREAST EXCISIONAL BIOPSY Right 03/12/2014   [facility]  . BREAST LUMPECTOMY WITH RADIOACTIVE SEED LOCALIZATION Right 03/12/2014   Procedure: BREAST LUMPECTOMY WITH RADIOACTIVE SEED LOCALIZATION;  Surgeon: Harriette Bouillonhomas Cornett, MD;  Location: [facility];  Service: General;  Laterality: Right;  . BREAST SURGERY     Lumpectomy-- BENIGN   . CATARACT EXTRACTION Bilateral 2011  . CESAREAN SECTION    . COSMETIC SURGERY  01/2017   MonaLisa Touch w/ GYN  . DILATION AND CURETTAGE OF UTERUS    . LIPOMA EXCISION  1978  . RIGHT HEART CATH N/A 11/26/2017   Procedure: RIGHT HEART CATH;  Surgeon: Laurey MoraleMcLean, Dalton S, MD;  [facility];  Service: Cardiovascular;  Laterality: N/A;  . UPPER  GASTROINTESTINAL ENDOSCOPY    . UPPER GI ENDOSCOPY  09/11/2014   Small Hiatus Hernia, Dr. Ewing SchleinMagod    Family History  Problem Relation Age of Onset  . Mitral valve prolapse Mother   . Breast cancer Mother [id number]       M had ca insitu (age 60), aunt   . Dementia Mother   . Heart disease Father [id number]       early   . Diabetes Father   . Kidney failure Father   . Sleep apnea Father   . Brain cancer Other        2 fam members   . Lung cancer Other        uncle, heavy smoker  . Breast cancer Maternal Aunt 60  . CAD Paternal Grandmother 8240  . CAD Paternal Grandfather 340  . Colon cancer Neg Hx     Social History   Socioeconomic History  . Marital status: Married    Spouse name: Not on file  . Number of children: 1  .  Years of education: College  . Highest education level: Not on file  Occupational History  . Occupation: retired principal  Tobacco Use  . Smoking status: Never Smoker  . Smokeless tobacco: Never Used  Substance and Sexual Activity  . Alcohol use: No  . Drug use: No  . Sexual activity: Yes  Other Topics Concern  . 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 Difficulty of Paying Living Expenses: Not on file  Food Insecurity:   . Worried About Charity fundraiser in the Last Year: Not on file  . Ran Out of Food in the Last Year: Not on file  Transportation Needs:   . Lack of Transportation (Medical): Not on file  . Lack of Transportation (Non-Medical): Not on file  Physical Activity:   . Days of Exercise per Week: Not on file  . Minutes of Exercise per Session: Not on file  Stress:   . Feeling of Stress : Not on file  Social Connections:   . Frequency of Communication with Friends and Family: Not on file  . Frequency of Social Gatherings with Friends and Family: Not on file   . Attends Religious Services: Not on file  . Active Member of Clubs or Organizations: Not on file  . Attends Archivist Meetings: Not on file  . Marital Status: Not on file  Intimate Partner Violence:   . Fear of Current or Ex-Partner: Not on file  . Emotionally Abused: Not on file  . Physically Abused: Not on file  . Sexually Abused: Not on file    Review of systems: Review of Systems  Constitutional: Negative for fever and chills.  HENT: Negative.   Eyes: Negative for blurred vision.  Respiratory: as per HPI  Cardiovascular: Negative for chest pain and palpitations.  Gastrointestinal: Negative for vomiting, diarrhea, blood per rectum. Genitourinary: Negative for dysuria, urgency, frequency and hematuria.  Musculoskeletal: Negative for myalgias, back pain and joint pain.  Skin: Negative for itching and rash.  Neurological: Negative for dizziness, tremors, focal weakness, seizures and loss of consciousness.  Endo/Heme/Allergies: Negative for environmental allergies.  Psychiatric/Behavioral: Negative for depression, suicidal ideas and hallucinations.  All other systems reviewed and are negative.  Physical Exam: Blood pressure 122/80, pulse 70, temperature (!) 97 F (36.1 C), temperature source Temporal, height 5\' 4"  (1.626 m), weight 183 lb (83 kg), SpO2 99 %. Gen:      No acute distress HEENT:  EOMI, sclera anicteric Neck:     No masses; no thyromegaly Lungs:    Clear to auscultation bilaterally; normal respiratory effort CV:         Regular rate and rhythm; no murmurs Abd:      + bowel sounds; soft, non-tender; no palpable masses, no distension Ext:    No edema; adequate peripheral perfusion Skin:      Warm and dry; no rash Neuro: alert and oriented x 3 Psych: normal mood and affect  Data Reviewed: Imaging: November 07, 2017 high-resolution CT scan of the chest images : Mild cylindrical bronchiectasis particularly in the left lower lobe, some in the right lower  lobe.  10/18/2015 chest CT images showing normal pulmonary parenchyma with the exception of a 3.4 mm nodule in the left lower lobe.  Pulmonary  function test: October 2019 ratio 90%, FVC 2.56 L 76% predicted, total lung capacity 4.32 L 84% predicted, DLCO 20.7 mL 84% predicted  Labs: October 2019 complement levels within normal limits, IgG subclasses all normal, alpha-1 antitrypsin level normal, CBC showed 300 absolute eosinophil count  11/03/2015 - Ige - 32  Heart catheterization: October 2019 right heart catheterization showed mean PA pressure 24, pulmonary capillary wedge pressure 19, PVR 0.7 Woods units  Assessment:  Bronchiectasis Continue chest vest, Mucinex.  Hypertonic saline for ciliary clearance  Severe persistent asthma Continue Symbicort, Spiriva, Dupixent  Plan/Recommendations: Follow-up in 6 months  [name] MD [facility] 01/23/2019, 4:30 PM  CC: Dianne Dun, MD

## 2019-01-23 NOTE — Telephone Encounter (Signed)
If her test was negative today then she should be able to come in for visit.

## 2019-01-30 ENCOUNTER — Other Ambulatory Visit: Payer: Self-pay

## 2019-01-30 ENCOUNTER — Telehealth: Payer: Self-pay | Admitting: Primary Care

## 2019-01-30 ENCOUNTER — Ambulatory Visit (INDEPENDENT_AMBULATORY_CARE_PROVIDER_SITE_OTHER): Payer: BC Managed Care – PPO | Admitting: Primary Care

## 2019-01-30 ENCOUNTER — Encounter: Payer: Self-pay | Admitting: Primary Care

## 2019-01-30 DIAGNOSIS — J4551 Severe persistent asthma with (acute) exacerbation: Secondary | ICD-10-CM | POA: Diagnosis not present

## 2019-01-30 DIAGNOSIS — R05 Cough: Secondary | ICD-10-CM

## 2019-01-30 DIAGNOSIS — R509 Fever, unspecified: Secondary | ICD-10-CM | POA: Diagnosis not present

## 2019-01-30 DIAGNOSIS — R6889 Other general symptoms and signs: Secondary | ICD-10-CM

## 2019-01-30 DIAGNOSIS — R519 Headache, unspecified: Secondary | ICD-10-CM

## 2019-01-30 DIAGNOSIS — J471 Bronchiectasis with (acute) exacerbation: Secondary | ICD-10-CM | POA: Diagnosis not present

## 2019-01-30 DIAGNOSIS — Z7951 Long term (current) use of inhaled steroids: Secondary | ICD-10-CM

## 2019-01-30 MED ORDER — PREDNISONE 10 MG PO TABS: ORAL_TABLET | ORAL | 0 refills | Status: DC

## 2019-01-30 NOTE — Telephone Encounter (Signed)
LMTCB

## 2019-01-30 NOTE — Patient Instructions (Addendum)
Possible Covid exposure - Continue to monitor temperature, take tylenol as needed and stay well hydrated - Needs covid testing, if positive she would likely qualify for monoclonal antibody infusion  - Given number for respiratory clinic if needed   Bronchiectasis - Continue Hypertonic saline nebulizer and therapy vest twice daily - Add mucinex 1,200mg  twice daily   Severe persistent asthma - Continue Symbicort 160 twice daily and Spiriva  - RX prednisone taper - If respiratory status declines needs ED evaluation   Follow up - 4-5 days with televisit

## 2019-01-30 NOTE — Progress Notes (Signed)
Virtual Visit via Telephone Note  I connected with Karen Barnes on 01/30/19 at 10:30 AM EST by telephone and verified that I am speaking with the correct person using two identifiers.  Location: Patient: Home Provider: Office   I discussed the limitations, risks, security and privacy concerns of performing an evaluation and management service by telephone and the availability of in person appointments. I also discussed with the patient that there may be a patient responsible charge related to this service. The patient expressed understanding and agreed to proceed.   History of Present Illness: 58 year old female, never smoked. PMH significant for bronchiectasis, severe persistent asthma, OSA, GERD, type 2 diabetes. Patient of Dr. [name], last seen on 01/23/19. Maintained on Symbicort, Spiriva and Dupixent.   01/30/2019 Patient contacted today for acute visit. Reports rattle in chest with some cough and yellow mucus production x 2 days. Associated headache, chills, shivers and low grade temp. She had contact with a coworker on 12/17 who's husband tested positive for covid. She is unsure if her coworker has been tested or is positive for COVID herself. They had a staff dinner on 12/8 where the husband attended. Ms Ballowe tested negative for COVID on December 7th and 17th when she had no symptoms. She has been using her hypertonic saline nebulizer and therapy vest twice as prescribed for her bronchiectasis. She continue using Symbicort and Spiriva for her asthma. She has been using her albuterol nebulizer 3-4 times a day for congestin and shortness of breath. Sputum culture in 2019 showed normal oropharyngeal flora.   Observations/Objective:  - Temp 99.4 with tylenol - Able to speak in full sentences, no audible wheezing noted   Assessment and Plan:  Possible Covid exposure - Experiencing viral-like symptoms x 1-2 days with low grade fever   - Advise she continue to monitor her  temperature, take tylenol as needed and stay well hydrated - Needs covid testing, if positive she would likely qualify for monoclonal antibody infusion  - Given number for respiratory clinic if needed   Bronchiectasis - Continue Hypertonic saline nebulizer and therapy vest twice daily - Add mucinex 1,200mg  twice daily   Severe persistent asthma with exacerbation  - Continue Symbicort 160 twice daily and Spiriva - RX prednisone taper (40mg  x 3 days; 30mg  x 3 dasy; 20mg  x 3 days; 10mg  x 3 days) - If respiratory status declines needs ED evaluation   Follow Up Instructions:  - Follow up 4-5 day televisit    I discussed the assessment and treatment plan with the patient. The patient was provided an opportunity to ask questions and all were answered. The patient agreed with the plan and demonstrated an understanding of the instructions.   The patient was advised to call back or seek an in-person evaluation if the symptoms worsen or if the condition fails to improve as anticipated.  I provided 22 minutes of non-face-to-face time during this encounter.   Martyn Ehrich, NP

## 2019-02-03 ENCOUNTER — Ambulatory Visit: Payer: BC Managed Care – PPO | Attending: Internal Medicine

## 2019-02-03 DIAGNOSIS — Z20822 Contact with and (suspected) exposure to covid-19: Secondary | ICD-10-CM

## 2019-02-03 DIAGNOSIS — J479 Bronchiectasis, uncomplicated: Secondary | ICD-10-CM

## 2019-02-03 NOTE — Telephone Encounter (Signed)
Left message for patient to call back  

## 2019-02-04 ENCOUNTER — Encounter: Payer: Self-pay | Admitting: Primary Care

## 2019-02-04 ENCOUNTER — Ambulatory Visit (INDEPENDENT_AMBULATORY_CARE_PROVIDER_SITE_OTHER): Payer: BC Managed Care – PPO | Admitting: Primary Care

## 2019-02-04 ENCOUNTER — Other Ambulatory Visit: Payer: Self-pay

## 2019-02-04 DIAGNOSIS — R05 Cough: Secondary | ICD-10-CM | POA: Diagnosis not present

## 2019-02-04 DIAGNOSIS — J471 Bronchiectasis with (acute) exacerbation: Secondary | ICD-10-CM | POA: Diagnosis not present

## 2019-02-04 DIAGNOSIS — R519 Headache, unspecified: Secondary | ICD-10-CM

## 2019-02-04 DIAGNOSIS — R5383 Other fatigue: Secondary | ICD-10-CM

## 2019-02-04 DIAGNOSIS — R6889 Other general symptoms and signs: Secondary | ICD-10-CM

## 2019-02-04 DIAGNOSIS — R6883 Chills (without fever): Secondary | ICD-10-CM

## 2019-02-04 NOTE — Progress Notes (Signed)
Virtual Visit via Telephone Note  I connected with [name] on 02/04/19 at  4:00 PM EST by telephone and verified that I am speaking with the correct person using two identifiers.  Location: Patient: Home Provider: Office   I discussed the limitations, risks, security and privacy concerns of performing an evaluation and management service by telephone and the availability of in person appointments. I also discussed with the patient that there may be a patient responsible charge related to this service. The patient expressed understanding and agreed to proceed.   History of Present Illness:  58 year old female, never smoked. PMH significant for bronchiectasis, severe persistent asthma, OSA, GERD, type 2 diabetes. Patient of Dr. [name], last seen on 01/23/19. Maintained on Symbicort, Spiriva and Dupixent.   Previous [facility] pulmonary encounter: 01/30/2019 Patient contacted today for acute visit. Reports rattle in chest with some cough and yellow mucus production x 2 days. Associated headache, chills, shivers and low grade temp. She had contact with a coworker on 12/17 who's husband tested positive for covid. She is unsure if her coworker has been tested or is positive for COVID herself. They had a staff dinner on 12/8 where the husband attended. Ms Mamone tested negative for COVID on December 7th and 17th when she had no symptoms. She has been using her hypertonic saline nebulizer and therapy vest twice as prescribed for her bronchiectasis. She continue using Symbicort and Spiriva for her asthma. She has been using her albuterol nebulizer 3-4 times a day for congestin and shortness of breath. Sputum culture in 2019 showed normal oropharyngeal flora.   02/04/2019 Patient contacted today for 5 day follow-up. She got covid tested yesterday but results are not back. She has scheduled a rapid test with [facility] tomorrow. She is still struggling with fatigue and congested cough. She feels her breathing is  "pretty good". She has 6 days left on prednisone taper. She has been using her therapy vest and hypertonic saline nebulizer daily. She has associated chills, headache, nausea, stomach cramping, loose stools. No known fevers.    Observations/Objective:   - Appears fatigued when speaking - Moderate cough, no obvious wheezing  Assessment and Plan:  Flu like symptoms - Covid tested 11/28, results pending; scheduled [facility] - If she is positive for COVID-19 recommending she be considered for outpatient monoclonal antibody infusion  - Continue to encourage tylenol prn fever/chills, oral hydration and rest  Asthma/bronchiectasis with acute exacerbation: - Continue prednisone taper as directed - Continue hypertonic saline nebulizer twice daily and therapy vest  - Continue Spiriva respimat daily and Symbicort 160 2bid - If rapid Covid negative recommending CXR tomorrow   Follow Up Instructions:   - FU after covid testing to determine next steps  I discussed the assessment and treatment plan with the patient. The patient was provided an opportunity to ask questions and all were answered. The patient agreed with the plan and demonstrated an understanding of the instructions.   The patient was advised to call back or seek an in-person evaluation if the symptoms worsen or if the condition fails to improve as anticipated.  I provided 18 minutes of non-face-to-face time during this encounter.   Martyn Ehrich, NP

## 2019-02-04 NOTE — Patient Instructions (Signed)
Flu like symptoms - Covid tested 11/28, results pending; scheduled [facility] - If she is positive for COVID-19 recommending she be considered for outpatient monoclonal antibody infusion  - Continue to encourage tylenol prn fever/chills, oral hydration and rest  Asthma/bronchiectasis with acute exacerbation: - Continue prednisone taper as directed - Continue hypertonic saline nebulizer twice daily and therapy vest  - Continue Spiriva respimat daily and Symbicort 160 2bid - If rapid Covid negative recommending CXR tomorrow

## 2019-02-04 NOTE — Telephone Encounter (Signed)
Spoke with pt, she did have her Covid test yesterday and stated it would take 3-5 days to return. She is gong to get a rapid test tomorrow at [facility]. I advised her to let us know if she test positive with the rapid test. Pt understood and nothing further is needed.

## 2019-02-05 ENCOUNTER — Telehealth: Payer: Self-pay | Admitting: Primary Care

## 2019-02-05 ENCOUNTER — Other Ambulatory Visit: Payer: Self-pay

## 2019-02-05 ENCOUNTER — Encounter (INDEPENDENT_AMBULATORY_CARE_PROVIDER_SITE_OTHER): Payer: Self-pay

## 2019-02-05 ENCOUNTER — Ambulatory Visit (INDEPENDENT_AMBULATORY_CARE_PROVIDER_SITE_OTHER): Payer: BC Managed Care – PPO | Admitting: Internal Medicine

## 2019-02-05 ENCOUNTER — Other Ambulatory Visit: Payer: Self-pay | Admitting: Nurse Practitioner

## 2019-02-05 VITALS — BP 122/76 | HR 83 | Temp 98.4°F | Ht 64.0 in | Wt 173.6 lb

## 2019-02-05 DIAGNOSIS — J479 Bronchiectasis, uncomplicated: Secondary | ICD-10-CM

## 2019-02-05 DIAGNOSIS — Z20822 Contact with and (suspected) exposure to covid-19: Secondary | ICD-10-CM

## 2019-02-05 DIAGNOSIS — E118 Type 2 diabetes mellitus with unspecified complications: Secondary | ICD-10-CM

## 2019-02-05 DIAGNOSIS — U071 COVID-19: Secondary | ICD-10-CM

## 2019-02-05 LAB — NOVEL CORONAVIRUS, NAA: SARS-CoV-2, NAA: DETECTED — AB

## 2019-02-05 NOTE — Telephone Encounter (Signed)
I will get in touch with the covid team to get her on the list for possible monoclonal infusion; can we also speak with Karen Barnes about getting her an apt with the resp clinic for exam and cxr

## 2019-02-05 NOTE — Progress Notes (Signed)
  I connected by phone with Karen Barnes on 02/05/2019 at 10:36 AM to discuss the potential use of an new treatment for mild to moderate COVID-19 viral infection in non-hospitalized patients.  This patient is a 58 y.o. female that meets the FDA criteria for Emergency Use Authorization of bamlanivimab or casirivimab\imdevimab.  Has a (+) direct SARS-CoV-2 viral test result  Has mild or moderate COVID-19   Is ? 60 years of age and weighs ? 40 kg  Is NOT hospitalized due to COVID-19  Is NOT requiring oxygen therapy or requiring an increase in baseline oxygen flow rate due to COVID-19  Is within 10 days of symptom onset  Has at least one of the high risk factor(s) for progression to severe COVID-19 and/or hospitalization as defined in EUA.  Specific high risk criteria : Diabetes   I have spoken and communicated the following to the patient or parent/caregiver:  1. FDA has authorized the emergency use of bamlanivimab and casirivimab\imdevimab for the treatment of mild to moderate COVID-19 in adults and pediatric patients with positive results of direct SARS-CoV-2 viral testing who are 40 years of age and older weighing at least 40 kg, and who are at high risk for progressing to severe COVID-19 and/or hospitalization.  2. The significant known and potential risks and benefits of bamlanivimab and casirivimab\imdevimab, and the extent to which such potential risks and benefits are unknown.  3. Information on available alternative treatments and the risks and benefits of those alternatives, including clinical trials.  4. Patients treated with bamlanivimab and casirivimab\imdevimab should continue to self-isolate and use infection control measures (e.g., wear mask, isolate, social distance, avoid sharing personal items, clean and disinfect "high touch" surfaces, and frequent handwashing) according to CDC guidelines.   5. The patient or parent/caregiver has the option to accept or refuse  bamlanivimab or casirivimab\imdevimab .  After reviewing this information with the patient, The patient agreed to proceed with receiving the bamlanimivab infusion and will be provided a copy of the Fact sheet prior to receiving the infusion.Fenton Foy 02/05/2019 10:36 AM

## 2019-02-05 NOTE — Telephone Encounter (Signed)
Spoke with patient. Patient states she would like to start the infusion therapy. Patient states she had follow up with BW 02/04/19 and they spoke about it. Patient has decided this is what she would like to do moving forward.  BW please advise

## 2019-02-05 NOTE — Telephone Encounter (Signed)
Spoke with patient. Let her know Karen Barnes's recommendations. Scheduled her for respiratory clinic 02/05/19 at 1800.  Nothing further needed at this time

## 2019-02-06 ENCOUNTER — Ambulatory Visit (HOSPITAL_COMMUNITY)
Admission: RE | Admit: 2019-02-06 | Discharge: 2019-02-06 | Disposition: A | Discharge status: Home / Self Care | Payer: BC Managed Care – PPO | Source: Ambulatory Visit | Attending: Pulmonary Disease | Admitting: Pulmonary Disease

## 2019-02-06 ENCOUNTER — Encounter (INDEPENDENT_AMBULATORY_CARE_PROVIDER_SITE_OTHER): Payer: Self-pay

## 2019-02-06 ENCOUNTER — Telehealth: Payer: Self-pay

## 2019-02-06 DIAGNOSIS — U071 COVID-19: Secondary | ICD-10-CM | POA: Insufficient documentation

## 2019-02-06 DIAGNOSIS — E118 Type 2 diabetes mellitus with unspecified complications: Secondary | ICD-10-CM | POA: Insufficient documentation

## 2019-02-06 MED ORDER — DIPHENHYDRAMINE HCL 50 MG/ML IJ SOLN: 50.0000 mg | Freq: Once | INTRAMUSCULAR | Status: DC | PRN

## 2019-02-06 MED ORDER — FAMOTIDINE IN NACL 20-0.9 MG/50ML-% IV SOLN: 20.0000 mg | Freq: Once | INTRAVENOUS | Status: DC | PRN

## 2019-02-06 MED ORDER — METHYLPREDNISOLONE SODIUM SUCC 125 MG IJ SOLR: 125.0000 mg | Freq: Once | INTRAMUSCULAR | Status: DC | PRN

## 2019-02-06 MED ORDER — ALBUTEROL SULFATE HFA 108 (90 BASE) MCG/ACT IN AERS: 2.0000 | INHALATION_SPRAY | Freq: Once | RESPIRATORY_TRACT | Status: DC | PRN

## 2019-02-06 MED ORDER — EPINEPHRINE 0.3 MG/0.3ML IJ SOAJ: 0.3000 mg | Freq: Once | INTRAMUSCULAR | Status: DC | PRN

## 2019-02-06 MED ORDER — SODIUM CHLORIDE 0.9 % IV SOLN
INTRAVENOUS | Status: DC | PRN
  Administered 2019-02-06: 250 mL via INTRAVENOUS

## 2019-02-06 MED ORDER — SODIUM CHLORIDE 0.9 % IV SOLN
700.0000 mg | Freq: Once | INTRAVENOUS | Status: AC
  Administered 2019-02-06: 700 mg via INTRAVENOUS
  Filled 2019-02-06: qty 20

## 2019-02-06 NOTE — Discharge Instructions (Signed)
10 Things You Can Do to Manage Your COVID-19 Symptoms at Home If you have possible or confirmed COVID-19: 1. Stay home from work and school. And stay away from other public places. If you must go out, avoid using any kind of public transportation, ridesharing, or taxis. 2. Monitor your symptoms carefully. If your symptoms get worse, call your healthcare provider immediately. 3. Get rest and stay hydrated. 4. If you have a medical appointment, call the healthcare provider ahead of time and tell them that you have or may have COVID-19. 5. For medical emergencies, call 911 and notify the dispatch personnel that you have or may have COVID-19. 6. Cover your cough and sneezes with a tissue or use the inside of your elbow. 7. Wash your hands often with soap and water for at least 20 seconds or clean your hands with an alcohol-based hand sanitizer that contains at least 60% alcohol. 8. As much as possible, stay in a specific room and away from other people in your home. Also, you should use a separate bathroom, if available. If you need to be around other people in or outside of the home, wear a mask. 9. Avoid sharing personal items with other people in your household, like dishes, towels, and bedding. 10. Clean all surfaces that are touched often, like counters, tabletops, and doorknobs. Use household cleaning sprays or wipes according to the label instructions. cdc.gov/coronavirus 08/07/2018 This information is not intended to replace advice given to you by your health care provider. Make sure you discuss any questions you have with your health care provider. Document Revised: 01/09/2019 Document Reviewed: 01/09/2019 Elsevier Patient Education  2020 [employer]  

## 2019-02-06 NOTE — Telephone Encounter (Signed)
[place] Call  Patient reports feeling very nauseated ( taking Zofran as directed) and diarrhea x 4 at 3 am this morning. Reviewed cough, weakness, vomiting, appetite and diarrhea protocols with patient.   Patient advised to contact infusion center before going due to her diarrhea.   Patient verbalized understanding, no further questions.

## 2019-02-06 NOTE — Progress Notes (Signed)
  Diagnosis: COVID-19  Physician: Marciano Sequin. [name], MD  Procedure: [facility] Infusion [facility] Med: bamlanivimab infusion - Provided patient with bamlanimivab fact sheet for patients, parents and caregivers prior to infusion.  Complications: No immediate complications noted.  Discharge: Discharged home   Karen Barnes 02/06/2019

## 2019-02-07 ENCOUNTER — Encounter (INDEPENDENT_AMBULATORY_CARE_PROVIDER_SITE_OTHER): Payer: Self-pay

## 2019-02-08 ENCOUNTER — Encounter (INDEPENDENT_AMBULATORY_CARE_PROVIDER_SITE_OTHER): Payer: Self-pay

## 2019-02-08 ENCOUNTER — Telehealth: Payer: Self-pay

## 2019-02-08 NOTE — Telephone Encounter (Signed)
SOB during the night. Felt like that she wasn't getting a full breath. Pt stated that she feels better this am. Plans to take her asthma medication breathing treatments.  Afebrile, No SOB this am. Pt stated that she felt better after filling out questionnaire.  Other symptoms (appetite and drinking fluids has improved.) Advised pt to go to ED for SOB worsens, wheezing occurs.

## 2019-02-09 ENCOUNTER — Encounter (INDEPENDENT_AMBULATORY_CARE_PROVIDER_SITE_OTHER): Payer: Self-pay

## 2019-02-10 ENCOUNTER — Ambulatory Visit (INDEPENDENT_AMBULATORY_CARE_PROVIDER_SITE_OTHER): Payer: BC Managed Care – PPO | Admitting: Primary Care

## 2019-02-10 ENCOUNTER — Encounter (INDEPENDENT_AMBULATORY_CARE_PROVIDER_SITE_OTHER): Payer: Self-pay

## 2019-02-10 ENCOUNTER — Encounter: Payer: Self-pay | Admitting: Primary Care

## 2019-02-10 DIAGNOSIS — U071 COVID-19: Secondary | ICD-10-CM | POA: Diagnosis not present

## 2019-02-10 DIAGNOSIS — J4551 Severe persistent asthma with (acute) exacerbation: Secondary | ICD-10-CM

## 2019-02-10 MED ORDER — PREDNISONE 10 MG PO TABS: ORAL_TABLET | ORAL | 0 refills | Status: DC

## 2019-02-10 NOTE — Progress Notes (Signed)
Virtual Visit via Telephone Note  I connected with Karen Barnes on 02/10/19 at  1:30 PM EST by telephone and verified that I am speaking with the correct person using two identifiers.  Location: Patient: Home Provider: Office   I discussed the limitations, risks, security and privacy concerns of performing an evaluation and management service by telephone and the availability of in person appointments. I also discussed with the patient that there may be a patient responsible charge related to this service. The patient expressed understanding and agreed to proceed.   History of Present Illness: 59 year old female, never smoked. PMH significant for bronchiectasis, severe persistent asthma, OSA, GERD, type 2 diabetes. Patient of Dr. Isaiah Serge, last seen on 01/23/19. Maintained on Symbicort, Spiriva and Dupixent.   Previous [facility] pulmonary encounter: 01/30/2019 Patient contacted today for acute visit. Reports rattle in chest with some cough and yellow mucus production x 2 days. Associated headache, chills, shivers and low grade temp. She had contact with a coworker on 12/17 who's husband tested positive for covid. She is unsure if her coworker has been tested or is positive for COVID herself. They had a staff dinner on 12/8 where the husband attended. Ms Luo tested negative for COVID on December 7th and 17th when she had no symptoms. She has been using her hypertonic saline nebulizer and therapy vest twice as prescribed for her bronchiectasis. She continue using Symbicort and Spiriva for her asthma. She has been using her albuterol nebulizer 3-4 times a day for congestin and shortness of breath. Sputum culture in 2019 showed normal oropharyngeal flora.   02/04/2019 Patient contacted today for 5 day follow-up. She got covid tested yesterday but results are not back. She has scheduled a rapid test with [facility] tomorrow. She is still struggling with fatigue and congested cough. She feels her breathing is  "pretty good". She has 6 days left on prednisone taper. She has been using her therapy vest and hypertonic saline nebulizer daily. She has associated chills, headache, nausea, stomach cramping, loose stools. No known fevers.    02/10/2019 Patient contacted today for follow-up televisit. She tested positive for Covid on 12/30, she completed monoclonal antibody infusion on 12/31. She is feeling a lot better over all. She has new burning symptom in her chest today, feels it is in her lungs. Her cough has also improved. Denies chest pain or GI symptoms. Husband recently also tested positive for COVID and is receiving antibody infusion today. Her o2 levels have been running ni the mid 90s. She reports O2 desaturate to 92-93% at night but improved with nebulized treatments. Denies fever, chest tightness, chest pain, N/V/D.   Observations/Objective:  - Mild DOE, able to speak in full sentences - No significant cough or wheezing  Assessment and Plan:  COVID-19 - Slowling improving - Tested positive on 12/30, received antibody infusion on 12/31 - She was seen at respiratory clinic on Tuesday - Continue to encourage rest, fluids, tylenol prn  - Advised ED if shortness of breath acutely worsens or O2 desaturates <90%   Asthma - Prolonged exacerbation d/t covid - Extend prednisone course (20mg  x 5 days; 10mg  x 5 days ) - Continue Symbicort 160 2bid; Spiriva respimat   Bronchiectasis  - Continue Hypertonic saline nebulizer and vest twice daily  Follow Up Instructions:  7-10 days with CXR   I discussed the assessment and treatment plan with the patient. The patient was provided an opportunity to ask questions and all were answered. The patient agreed  with the plan and demonstrated an understanding of the instructions.   The patient was advised to call back or seek an in-person evaluation if the symptoms worsen or if the condition fails to improve as anticipated.  I provided 22 minutes of  non-face-to-face time during this encounter.   Martyn Ehrich, NP '

## 2019-02-10 NOTE — Patient Instructions (Signed)
  COVID-19 - Clinically slowly improving - Tested positive on 02/05/19, received antibody infusion on 01/09/19 - She was seen at respiratory clinic on Tuesday 02/03/19 - Continue to encourage rest, fluids, tylenol prn fever/shills/aches - Advise ED if shortness of breath acutely worsens or O2 desaturates <90%   Asthma - Prolonged exacerbation d/t covid - Extend prednisone course (20mg  x 5 days; 10mg  x 5 days ) - Continue Symbicort 160 two puffs twice daily; Spiriva respimat once daily  Bronchiectasis  - Continue Hypertonic saline nebulizer and vest twice daily  Follow-up 1 week follow-up with NP

## 2019-02-11 ENCOUNTER — Encounter (INDEPENDENT_AMBULATORY_CARE_PROVIDER_SITE_OTHER): Payer: Self-pay

## 2019-02-11 DIAGNOSIS — Z20822 Contact with and (suspected) exposure to covid-19: Secondary | ICD-10-CM | POA: Insufficient documentation

## 2019-02-11 NOTE — Patient Instructions (Signed)
See assessment and plan under each diagnosis in the problem list and acutely for this 02/05/2019  visit

## 2019-02-11 NOTE — Assessment & Plan Note (Signed)
Encouraged to continue the therapy vest as prescribed by pulmonary

## 2019-02-11 NOTE — Progress Notes (Signed)
Respiratory Clinic Note   Patient: Karen Barnes Female    DOB: [birth year]   59 y.o.   MRN: [id number] Visit Date: 02/05/2019  Today's Provider: [facility]   Chief Complaint  Patient presents with  . covid    severe stomach pain, fever 100.8,cough    Subjective:    Covid-19 Nucleic Acid Test Results Lab Results  Component Value Date   SARSCOV2NAA Detected (A) 02/03/2019    HPI She is on an intensive pulmonarytoilet program because of a history of asthma complicated by bronchiectasis.  She has hypertonic saline nebulization treatments as well as a therapy vest.  Recently she has not been as compliant with the therapy vest.   The husband of a coworker had Covid and she was in contact with that coworker on 01/23/2019.  She was unclear as to the Covid testing results for the coworker.  The patient had been at a staff dinner on 12/8 which the husband & the coworker apparently attended.   Pulmonary notes 12/24 described some loose congestion in her chest with yellow mucus production for 2 days.  She had associated headache, chills, slight rigor and low-grade temp. She was tested for Covid 12/28 but was unaware of the results. Monoclonal antibody infusion was scheduled for 12/31 and she questions whether she should pursue this. The congested cough has persisted accompanied by fatigue.  O2 sats have remained in the mid 90s.  She had been on a prednisone taper.  The symptom complex was associated with chills, headache, abdominal cramping, nausea, and loose stool.  Other comorbidities present include:  obstructive sleep apnea,  diabetes, history of breast cancer, dyslipidemia, and recurrent cystitis.  She has never smoked. Past history includes lumpectomy with radioactive seed localization.   Current Outpatient Medications:  .  albuterol (PROVENTIL HFA;VENTOLIN HFA) 108 (90 BASE) MCG/ACT inhaler, Inhale 2 puffs into the lungs every 6 (six) hours as needed., Disp: , Rfl:  .   aspirin EC 81 MG tablet, Take 81 mg by mouth daily., Disp: , Rfl:  .  bifidobacterium infantis (ALIGN) capsule, Take 1 capsule by mouth daily., Disp: , Rfl:  .  budesonide-formoterol (SYMBICORT) 160-4.5 MCG/ACT inhaler, Inhale 2 puffs into the lungs 2 (two) times daily., Disp: , Rfl:  .  Calcium Carbonate-Vitamin D (CALCIUM 600+D) 600-200 MG-UNIT TABS, Take 1 tablet by mouth daily., Disp: , Rfl:  .  Continuous Blood Gluc Receiver (FREESTYLE LIBRE 14 DAY READER) DEVI, USE AS DIRECTED, Disp: 1 Device, Rfl: 0 .  Continuous Blood Gluc Sensor (FREESTYLE LIBRE 14 DAY SENSOR) MISC, USE AS DIRECTED PER MD, Disp: 2 each, Rfl: 5 .  EPINEPHrine (AUVI-Q) 0.3 mg/0.3 mL IJ SOAJ injection, Inject 0.3 mg into the muscle as directed. For immunotherapy (allergy shots) anaphylactic reactions, Disp: , Rfl:  .  fexofenadine (ALLEGRA) 180 MG tablet, Take 180 mg by mouth every morning. , Disp: , Rfl:  .  fluticasone (FLONASE) 50 MCG/ACT nasal spray, Place 2 sprays into both nostrils 2 (two) times daily. , Disp: , Rfl:  .  furosemide (LASIX) 20 MG tablet, TAKE 1 TABLET(20 MG) BY MOUTH DAILY AS NEEDED FOR FLUID RETENTION OR SWELLING, Disp: 30 tablet, Rfl: 8 .  hydrocortisone 2.5 % cream, APPLY EXTERNALLY TO THE AFFECTED AREA TWICE DAILY AS NEEDED (Patient taking differently: Apply 1 application topically 2 (two) times daily as needed (for skin irritation/rash). ), Disp: 30 g, Rfl: 0 .  metFORMIN (GLUCOPHAGE) 1000 MG tablet, Take 1 tablet (1,000 mg total) by mouth 2 (  two) times daily., Disp: 180 tablet, Rfl: 1 .  montelukast (SINGULAIR) 10 MG tablet, Take 10 mg by mouth at bedtime.  , Disp: , Rfl:  .  Multiple Vitamins-Minerals (MULTIVITAMIN WITH MINERALS) tablet, Take 1 tablet by mouth daily., Disp: , Rfl:  .  ondansetron (ZOFRAN-ODT) 4 MG disintegrating tablet, DISSOLVE 1 TABLET BY MOUTH EVERY 8 HOURS AS NEEDED FOR NAUSEA OR VOMITING, Disp: 30 tablet, Rfl: 5 .  pantoprazole (PROTONIX) 40 MG tablet, TAKE 1 TABLET(40 MG) BY  MOUTH DAILY, Disp: 90 tablet, Rfl: 3 .  predniSONE (DELTASONE) 10 MG tablet, Take 4 tabs po daily x 3 days; then 3 tabs daily x3 days; then 2 tabs daily x3 days; then 1 tab daily x 3 days; then stop, Disp: 30 tablet, Rfl: 0 .  PRESCRIPTION MEDICATION, Allergy Shots, Disp: , Rfl:  .  rosuvastatin (CRESTOR) 5 MG tablet, TAKE 1 TABLET(5 MG) BY MOUTH DAILY, Disp: 90 tablet, Rfl: 1 .  sodium chloride HYPERTONIC 3 % nebulizer solution, INHALE CONTENTS OF 1 VIAL VIA NEBULIZER TWICE DAILY AS DIRECTED., Disp: 240 mL, Rfl: 1 .  SPIRIVA RESPIMAT 1.25 MCG/ACT AERS, INHALE 2 PUFF BY MOUTH EVERY DAY, Disp: 4 g, Rfl: 11 .  Dupilumab (DUPIXENT) 300 MG/2ML SOSY, Inject 300 mg into the skin every 14 (fourteen) days., Disp: 4 mL, Rfl: 11 .  erythromycin ophthalmic ointment, Place 1 application into both eyes 4 (four) times daily., Disp: 3.5 g, Rfl: 0 .  neomycin-polymyxin-dexamethasone (MAXITROL) 0.1 % ophthalmic suspension, Place 2 drops into both eyes 3 (three) times daily., Disp: 5 mL, Rfl: 0 .  nitrofurantoin, macrocrystal-monohydrate, (MACROBID) 100 MG capsule, TAKE AS DIRECTED POST COITAL, Disp: 30 capsule, Rfl: 0 .  omeprazole (PRILOSEC) 40 MG capsule, Take 40 mg by mouth at bedtime., Disp: , Rfl:  .  predniSONE (DELTASONE) 10 MG tablet, Take 2 tabs x 5 days; 1 tab x 5 days then stop, Disp: 15 tablet, Rfl: 0  Allergies  Allergen Reactions  . Butorphanol Tartrate Other (See Comments)    REACTION: hallucinations  . Hydrocodone Other (See Comments)    HALLUCINATIONS  . Roxicet [Oxycodone-Acetaminophen] Other (See Comments)    HALLUCINATIONS  . Penicillins Hives    Occurred at age 8/CHILDHOOD ALLERGY Has patient had a PCN reaction causing immediate rash, facial/tongue/throat swelling, SOB or lightheadedness with hypotension: Unknown Has patient had a PCN reaction causing severe rash involving mucus membranes or skin necrosis: Unknown Has patient had a PCN reaction that required hospitalization: Unknown Has  patient had a PCN reaction occurring within the last 10 years: No If all of the above answers are "NO", then may proceed with Cephalosporin use.     Review of Systems  Review of systems focused on signs and symptoms expected with Covid infection. Constitutional: Fever, fatigue, chills HEENT: Eye redness and discharge (conjunctivitis), nasal congestion, sore throat, anosmia, and altered taste Pulmonary: Cough nonproductive productive of sputum , dyspnea, tachycardia (pulse greater than 110), hemoptysis, chest pain, and tachypnea (respiratory rate greater than 22 GI: Anorexia, nausea, vomiting, diarrhea Genitourinary: Oliguria or anuria Skeletal: Myalgias Dermatologic: Rash Neurologic: Headache, dizziness, mental status changes The symptom complex is outlined above in the HPI.  She also was treated for conjunctivitis in November but has no active symptoms at this time.     Objective:   BP 122/76   Pulse 83   Temp 98.4 F (36.9 C)   Ht 5\' 4"  (1.626 m)   Wt 173 lb 9.6 oz (78.7 kg)   SpO2 95%  BMI 29.80 kg/m   Physical Exam   Pertinent or positive findings: Despite her complex, complicated past medical history and history of the acute symptoms; her exam was surprisingly unrevealing.  S2 slightly accentuated.There were minimal rales at the base.  General appearance: Adequately nourished; no acute distress, increased work of breathing is present.   Lymphatic: No lymphadenopathy about the head, neck, axilla. Eyes: No conjunctival inflammation or lid edema is present. There is no scleral icterus. Ears:  External ear exam shows no significant lesions or deformities.   Nose:  External nasal examination shows no deformity or inflammation. Nasal mucosa are pink and moist without lesions, exudates Oral exam:  Lips and gums are healthy appearing. There is no oropharyngeal erythema or exudate. Neck:  No thyromegaly, masses, tenderness noted.    Heart:  No gallop, murmur, click, rub .  Lungs:   without wheezes, rhonchi,  rubs. Abdomen: Bowel sounds are normal. Abdomen is soft and nontender with no organomegaly, hernias, masses. GU: Deferred  Extremities:  No cyanosis, clubbing, edema  Skin: Warm & dry w/o tenting. No significant lesions or rash.    No results found for any visits on 02/05/19.    Assessment & Plan    See summary under each active problem in the Problem List with associated updated therapeutic plan    [facility]

## 2019-02-11 NOTE — Assessment & Plan Note (Addendum)
As of 02/05/2019 final results pending Recommended she complete the monoclonal antibody infusion as recommended by pulmonary

## 2019-02-12 ENCOUNTER — Encounter (INDEPENDENT_AMBULATORY_CARE_PROVIDER_SITE_OTHER): Payer: Self-pay

## 2019-02-13 ENCOUNTER — Encounter (INDEPENDENT_AMBULATORY_CARE_PROVIDER_SITE_OTHER): Payer: Self-pay

## 2019-02-14 ENCOUNTER — Encounter (INDEPENDENT_AMBULATORY_CARE_PROVIDER_SITE_OTHER): Payer: Self-pay

## 2019-02-15 ENCOUNTER — Encounter (INDEPENDENT_AMBULATORY_CARE_PROVIDER_SITE_OTHER): Payer: Self-pay

## 2019-02-16 ENCOUNTER — Encounter (INDEPENDENT_AMBULATORY_CARE_PROVIDER_SITE_OTHER): Payer: Self-pay

## 2019-02-17 ENCOUNTER — Telehealth: Payer: Self-pay | Admitting: Primary Care

## 2019-02-17 ENCOUNTER — Encounter (INDEPENDENT_AMBULATORY_CARE_PROVIDER_SITE_OTHER): Payer: Self-pay

## 2019-02-17 NOTE — Telephone Encounter (Signed)
Pt wants to know when it's safe for her to resume her allergy injections and her Dupixent.    Pt states that she tested positive on 02/03/2019 for Covid-19, started exhibiting s/s on 12/24 but was delayed in getting tested d/t Christmas.    Still has a prod cough with thick yellow mucus (pt also has bronchiectasis), sinus congestion, intermittent burning in chest. Pt is supposed to be coming in on Thursday 1/14 for cxr in our office (22 days from onset of symptoms per [facility] per pt).   Using vibratory vest, hypertonic saline nebs.  Also received the monoclonal antibody infusion.    Routing to [place] since she last saw pt and Dr. Isaiah Serge is not in office.  Please advise, thanks!

## 2019-02-17 NOTE — Telephone Encounter (Signed)
Yes she can resume allergy injections- recommend getting Thursday 1/14 if able

## 2019-02-17 NOTE — Telephone Encounter (Signed)
Karen Barnes, message forwarded to you per our chat conversation.

## 2019-02-18 ENCOUNTER — Encounter (INDEPENDENT_AMBULATORY_CARE_PROVIDER_SITE_OTHER): Payer: Self-pay

## 2019-02-18 NOTE — Telephone Encounter (Signed)
ATC patient.  LM with Beth's recommendations on VM (DPR) and to call back.

## 2019-02-19 NOTE — Telephone Encounter (Signed)
Called and spoke with Patient.  Waynetta Sandy, NP recommendations given.  Understanding stated. Patient does home Dupixent injections herself. Nothing further at this time.

## 2019-02-20 ENCOUNTER — Telehealth: Payer: Self-pay | Admitting: Primary Care

## 2019-02-20 ENCOUNTER — Ambulatory Visit (INDEPENDENT_AMBULATORY_CARE_PROVIDER_SITE_OTHER): Payer: BC Managed Care – PPO

## 2019-02-20 DIAGNOSIS — J471 Bronchiectasis with (acute) exacerbation: Secondary | ICD-10-CM

## 2019-02-20 DIAGNOSIS — R6889 Other general symptoms and signs: Secondary | ICD-10-CM | POA: Diagnosis not present

## 2019-02-20 MED ORDER — LEVOFLOXACIN 500 MG PO TABS: 500.0000 mg | ORAL_TABLET | Freq: Every day | ORAL | 0 refills | Status: DC

## 2019-02-20 NOTE — Telephone Encounter (Signed)
02/20/2019 1536  Contacted patient directly to discuss chest x-ray results.  Likely viral pneumonia but given patient's history of bronchiectasis, continued cough with congestion with yellow mucus, lung pain.  Will treat with antibiotics.  I have called in a course of Levaquin.  Levaquin 500mg  tablet >>> Take 1 500 mg tablet daily for the next 7 days >>> Take with food >>> start probiotic for good gut health >>>avoid rigorous exercise for next 2 weeks   Reviewed this with the patient.  She reports she will pick up the medications.  We will route these results to EW as FYI.  , FNP

## 2019-02-20 NOTE — Telephone Encounter (Signed)
Spoke with Vicente Serene at [facility] radiology  Call report on CXR dated:  IMPRESSION: Small focus of suspected pneumonia in right middle lobe. Lungs elsewhere clear. Cardiac silhouette normal. No adenopathy.  These results will be called to the ordering clinician or representative by the Radiologist Assistant, and communication documented in the PACS or zVision Dashboard.   Electronically Signed   By: Bretta Bang III M.D.   On: 02/20/2019 14:56  Will forward to Arlys John since [place] out of clinic this afternoon

## 2019-02-21 NOTE — Telephone Encounter (Signed)
Thank you for taking care of this, appreciate it

## 2019-02-25 ENCOUNTER — Telehealth: Payer: Self-pay | Admitting: Physician Assistant

## 2019-02-25 NOTE — Telephone Encounter (Signed)
   Chart prepping for Thursday, noted patient has had ongoing management by pulm for recent Covid issues (tested + 12/28), recent CXR 1/14 suggestive of PNA, being started on antibiotics. If this is routine cardiology visit with planned routine labs, may make more sense to postpone until feeling better so we get more accurate picture of how she is doing from cardiac standpoint. Happy to do either. Can call and offer to patient. Bejamin Hackbart PA-C

## 2019-02-26 NOTE — Telephone Encounter (Signed)
Call placed to pt re: phone message from [facility], [place]. Pt has agreed to push out her appt til 2/4/20210 Pt will get her labs drawn at her PCP's office before hand and bring in results to [place].  She was given the orders that Dr. Delton See had asked for pt to have done.  Pt very grateful for the call.

## 2019-02-27 ENCOUNTER — Ambulatory Visit: Payer: BC Managed Care – PPO | Admitting: Physician Assistant

## 2019-02-28 NOTE — Progress Notes (Signed)
Virtual Visit via Video   Due to the COVID-19 pandemic, this visit was completed with telemedicine (audio/video) technology to reduce patient and provider exposure as well as to preserve personal protective equipment.   I connected with Karen Barnes by a video enabled telemedicine application and verified that I am speaking with the correct person using two identifiers. Location patient: Home Location provider: [facility], Office Persons participating in the virtual visit: Reita Chard, MD   I discussed the limitations of evaluation and management by telemedicine and the availability of in person appointments. The patient expressed understanding and agreed to proceed.  Care Team   Patient Care Team: Lucille Passy, MD as PCP - General (Family Medicine) Dorothy Spark, MD as PCP - Cardiology (Cardiology) Erroll Luna, MD as Consulting Physician (General Surgery) Larey Dresser, MD as Consulting Physician (Cardiology) Harold Hedge, Darrick Grinder, MD (Allergy and Immunology) Deliah Goody, PA-C as Physician Assistant (Gastroenterology) Clarene Essex, MD as Consulting Physician (Gastroenterology) Marylynn Pearson, MD as Consulting Physician (Obstetrics and Gynecology) Gevena Cotton, MD as Consulting Physician (Ophthalmology)  Subjective:   HPI:   Follow up chronic medical conditions and recent COVID 19 PNA-  Unfortunately diagnosed with covid earlier this month (02/10/19).  On 02/20/19, had CXR and symptoms consistent with PNA- treated with levaquin 500 mg daily x 7 days.   DG Chest 2 View  Result Date: 02/20/2019 CLINICAL DATA:  Cough.  Recent COVID-19 positive EXAM: CHEST - 2 VIEW COMPARISON:  October 22, 2017 FINDINGS: There is a small focus of opacity in the right middle lobe region. Lungs elsewhere are clear. Heart size and pulmonary vascularity are normal. No adenopathy. No bone lesions. IMPRESSION: Small focus of suspected pneumonia in right middle  lobe. Lungs elsewhere clear. Cardiac silhouette normal. No adenopathy. These results will be called to the ordering clinician or representative by the Radiologist Assistant, and communication documented in the PACS or zVision Dashboard. Electronically Signed   By: Lowella Grip III M.D.   On: 02/20/2019 14:56     Review of Systems  Constitutional: Negative.  Negative for fever and malaise/fatigue.  HENT: Negative.  Negative for congestion and hearing loss.   Eyes: Negative.  Negative for blurred vision, discharge and redness.  Respiratory: Positive for cough. Negative for hemoptysis, sputum production, shortness of breath and wheezing.   Cardiovascular: Negative for chest pain, palpitations and leg swelling.  Gastrointestinal: Negative.  Negative for abdominal pain and heartburn.  Genitourinary: Negative.  Negative for dysuria.  Musculoskeletal: Negative.  Negative for falls.  Skin: Negative.  Negative for rash.  Neurological: Negative.  Negative for loss of consciousness and headaches.  Endo/Heme/Allergies: Negative.  Does not bruise/bleed easily.  Psychiatric/Behavioral: Negative.  Negative for depression.  All other systems reviewed and are negative.    Patient Active Problem List   Diagnosis Date Noted  . Pneumonia due to COVID-19 virus 03/03/2019  . Contact with and (suspected) exposure to covid-19 02/11/2019  . Subdural hematoma ([place]) 12/12/2018  . Conjunctivitis 12/12/2018  . Severe persistent asthma with exacerbation 10/31/2018  . Bilateral leg edema 09/01/2018  . Bronchiectasis without complication ([place]) [id number]/[id number]  . On corticosteroid therapy 05/29/2018  . Bronchiectasis with acute exacerbation ([place]) 05/16/2018  . Thyroid nodule 11/08/2017  . Low TSH level 11/08/2017  . Edema of both ankles 02/03/2016  . 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 OSA (obstructive sleep apnea) 02/03/2016  . Lung nodule 11/03/2015  . PCP NOTES >>>>>>>>>>>>>>>>>>>>>>>>>>>>>>>>>>> 03/26/2015    .  Family history of breast cancer 02/23/2012  . At risk for coronary artery disease 02/07/2012  . Obesity 12/13/2010  . Heart palpitations 12/13/2010  . Elevated blood pressure reading 12/13/2010  . Hyperlipidemia 03/07/2007  . [facility] 03/07/2007  . CYSTITIS, RECURRENT 03/07/2007  . Type 2 diabetes mellitus with complication (HCC) 03/07/2007  . Asthmatic bronchitis with acute exacerbation 07/12/2006  . GERD 07/12/2006    Social History   Tobacco Use  . Smoking status: Never Smoker  . Smokeless tobacco: Never Used  Substance Use Topics  . Alcohol use: No    Current Outpatient Medications:  .  albuterol (PROVENTIL HFA;VENTOLIN HFA) 108 (90 BASE) MCG/ACT inhaler, Inhale 2 puffs into the lungs every 6 (six) hours as needed., Disp: , Rfl:  .  aspirin EC 81 MG tablet, Take 81 mg by mouth daily., Disp: , Rfl:  .  bifidobacterium infantis (ALIGN) capsule, Take 1 capsule by mouth daily., Disp: , Rfl:  .  budesonide-formoterol (SYMBICORT) 160-4.5 MCG/ACT inhaler, Inhale 2 puffs into the lungs 2 (two) times daily., Disp: , Rfl:  .  Calcium Carbonate-Vitamin D (CALCIUM 600+D) 600-200 MG-UNIT TABS, Take 1 tablet by mouth daily., Disp: , Rfl:  .  Continuous Blood Gluc Receiver (FREESTYLE LIBRE 14 DAY READER) DEVI, USE AS DIRECTED, Disp: 1 Device, Rfl: 0 .  Continuous Blood Gluc Sensor (FREESTYLE LIBRE 14 DAY SENSOR) MISC, USE AS DIRECTED PER MD, Disp: 2 each, Rfl: 5 .  Dupilumab (DUPIXENT) 300 MG/2ML SOSY, Inject 300 mg into the skin every 14 (fourteen) days., Disp: 4 mL, Rfl: 11 .  EPINEPHrine (AUVI-Q) 0.3 mg/0.3 mL IJ SOAJ injection, Inject 0.3 mg into the muscle as directed. For immunotherapy (allergy shots) anaphylactic reactions, Disp: , Rfl:  .  erythromycin ophthalmic ointment, Place 1 application into both eyes 4 (four) times daily., Disp: 3.5 g, Rfl: 0 .  fexofenadine (ALLEGRA) 180 MG tablet, Take 180 mg by mouth every morning. , Disp: , Rfl:  .  fluticasone (FLONASE) 50 MCG/ACT nasal  spray, Place 2 sprays into both nostrils 2 (two) times daily. , Disp: , Rfl:  .  furosemide (LASIX) 20 MG tablet, TAKE 1 TABLET(20 MG) BY MOUTH DAILY AS NEEDED FOR FLUID RETENTION OR SWELLING, Disp: 30 tablet, Rfl: 8 .  hydrocortisone 2.5 % cream, APPLY EXTERNALLY TO THE AFFECTED AREA TWICE DAILY AS NEEDED (Patient taking differently: Apply 1 application topically 2 (two) times daily as needed (for skin irritation/rash). ), Disp: 30 g, Rfl: 0 .  levofloxacin (LEVAQUIN) 500 MG tablet, Take 1 tablet (500 mg total) by mouth daily., Disp: 7 tablet, Rfl: 0 .  metFORMIN (GLUCOPHAGE) 1000 MG tablet, Take 1 tablet (1,000 mg total) by mouth 2 (two) times daily., Disp: 180 tablet, Rfl: 1 .  montelukast (SINGULAIR) 10 MG tablet, Take 10 mg by mouth at bedtime.  , Disp: , Rfl:  .  Multiple Vitamins-Minerals (MULTIVITAMIN WITH MINERALS) tablet, Take 1 tablet by mouth daily., Disp: , Rfl:  .  neomycin-polymyxin-dexamethasone (MAXITROL) 0.1 % ophthalmic suspension, Place 2 drops into both eyes 3 (three) times daily., Disp: 5 mL, Rfl: 0 .  nitrofurantoin, macrocrystal-monohydrate, (MACROBID) 100 MG capsule, TAKE AS DIRECTED POST COITAL, Disp: 30 capsule, Rfl: 0 .  omeprazole (PRILOSEC) 40 MG capsule, Take 40 mg by mouth at bedtime., Disp: , Rfl:  .  ondansetron (ZOFRAN-ODT) 4 MG disintegrating tablet, DISSOLVE 1 TABLET BY MOUTH EVERY 8 HOURS AS NEEDED FOR NAUSEA OR VOMITING, Disp: 30 tablet, Rfl: 5 .  pantoprazole (PROTONIX) 40 MG tablet, TAKE  1 TABLET(40 MG) BY MOUTH DAILY, Disp: 90 tablet, Rfl: 3 .  predniSONE (DELTASONE) 10 MG tablet, Take 4 tabs po daily x 3 days; then 3 tabs daily x3 days; then 2 tabs daily x3 days; then 1 tab daily x 3 days; then stop, Disp: 30 tablet, Rfl: 0 .  predniSONE (DELTASONE) 10 MG tablet, Take 2 tabs x 5 days; 1 tab x 5 days then stop, Disp: 15 tablet, Rfl: 0 .  PRESCRIPTION MEDICATION, Allergy Shots, Disp: , Rfl:  .  rosuvastatin (CRESTOR) 5 MG tablet, TAKE 1 TABLET(5 MG) BY MOUTH  DAILY, Disp: 90 tablet, Rfl: 1 .  sodium chloride HYPERTONIC 3 % nebulizer solution, INHALE CONTENTS OF 1 VIAL VIA NEBULIZER TWICE DAILY AS DIRECTED., Disp: 240 mL, Rfl: 1 .  SPIRIVA RESPIMAT 1.25 MCG/ACT AERS, INHALE 2 PUFF BY MOUTH EVERY DAY, Disp: 4 g, Rfl: 11  Allergies  Allergen Reactions  . Butorphanol Tartrate Other (See Comments)    REACTION: hallucinations  . Hydrocodone Other (See Comments)    HALLUCINATIONS  . Roxicet [Oxycodone-Acetaminophen] Other (See Comments)    HALLUCINATIONS  . Penicillins Hives    Occurred at age 54/CHILDHOOD ALLERGY Has patient had a PCN reaction causing immediate rash, facial/tongue/throat swelling, SOB or lightheadedness with hypotension: Unknown Has patient had a PCN reaction causing severe rash involving mucus membranes or skin necrosis: Unknown Has patient had a PCN reaction that required hospitalization: Unknown Has patient had a PCN reaction occurring within the last 10 years: No If all of the above answers are "NO", then may proceed with Cephalosporin use.     Objective:  BP 116/76   Pulse (!) 55   Temp 97.8 F (36.6 C)   Wt 180 lb (81.6 kg)   SpO2 100%   BMI 30.90 kg/m   VITALS: Per patient if applicable, see vitals. GENERAL: Alert, appears well and in no acute distress. HEENT: Atraumatic, conjunctiva clear, no obvious abnormalities on inspection of external nose and ears. NECK: Normal movements of the head and neck. CARDIOPULMONARY: No increased WOB. Speaking in clear sentences. I:E ratio WNL.  MS: Moves all visible extremities without noticeable abnormality. PSYCH: Pleasant and cooperative, well-groomed. Speech normal rate and rhythm. Affect is appropriate. Insight and judgement are appropriate. Attention is focused, linear, and appropriate.  NEURO: CN grossly intact. Oriented as arrived to appointment on time with no prompting. Moves both UE equally.  SKIN: No obvious lesions, wounds, erythema, or cyanosis noted on face or  hands.  Depression screen [facility] 2/9 10/03/2017 09/26/2016 09/24/2015  Decreased Interest 0 0 0  Down, Depressed, Hopeless 0 0 0  PHQ - 2 Score 0 0 0  Some recent data might be hidden     . COVID-19 Education: The signs and symptoms of COVID-19 were discussed with the patient and how to seek care for testing if needed. The importance of social distancing was discussed today. . Reviewed expectations re: course of current medical issues. . Discussed self-management of symptoms. . Outlined signs and symptoms indicating need for more acute intervention. . Patient verbalized understanding and all questions were answered. Marland Kitchen Health Maintenance issues including appropriate healthy diet, exercise, and smoking avoidance were discussed with patient. . See orders for this visit as documented in the electronic medical record.  Ruthe Mannan, MD  Records requested if needed. Time spent: 25 minutes, of which >50% was spent in obtaining information about her symptoms, reviewing her previous labs, evaluations, and treatments, counseling her about her condition (please see the discussed topics  above), and developing a plan to further investigate it; she had a number of questions which I addressed.   Lab Results  Component Value Date   WBC 5.2 09/02/2018   HGB 11.8 09/02/2018   HCT 38.0 09/02/2018   PLT 304 09/02/2018   GLUCOSE 127 (H) 09/02/2018   CHOL 160 09/02/2018   TRIG 107.0 09/02/2018   HDL 64.20 09/02/2018   LDLCALC 74 09/02/2018   ALT 14 09/02/2018   AST 14 09/02/2018   NA 141 09/02/2018   K 3.7 09/02/2018   CL 101 09/02/2018   CREATININE 0.76 09/02/2018   BUN 12 09/02/2018   CO2 29 09/02/2018   TSH 1.78 09/02/2018   HGBA1C 7.6 (H) 09/02/2018   HGBA1C 7.6 (H) 09/02/2018   MICROALBUR <0.7 09/02/2018    Lab Results  Component Value Date   TSH 1.78 09/02/2018   Lab Results  Component Value Date   WBC 5.2 09/02/2018   HGB 11.8 09/02/2018   HCT 38.0 09/02/2018   MCV 81 09/02/2018    PLT 304 09/02/2018   Lab Results  Component Value Date   NA 141 09/02/2018   K 3.7 09/02/2018   CO2 29 09/02/2018   GLUCOSE 127 (H) 09/02/2018   BUN 12 09/02/2018   CREATININE 0.76 09/02/2018   BILITOT 0.3 09/02/2018   ALKPHOS 63 09/02/2018   AST 14 09/02/2018   ALT 14 09/02/2018   PROT 6.9 09/02/2018   ALBUMIN 4.4 09/02/2018   CALCIUM 9.5 09/02/2018   ANIONGAP 8 11/26/2017   GFR 77.65 09/02/2018   Lab Results  Component Value Date   CHOL 160 09/02/2018   Lab Results  Component Value Date   HDL 64.20 09/02/2018   Lab Results  Component Value Date   LDLCALC 74 09/02/2018   Lab Results  Component Value Date   TRIG 107.0 09/02/2018   Lab Results  Component Value Date   CHOLHDL 2 09/02/2018   Lab Results  Component Value Date   HGBA1C 7.6 (H) 09/02/2018   HGBA1C 7.6 (H) 09/02/2018       Assessment & Plan:   Problem List Items Addressed This Visit      Active Problems   Hyperlipidemia    Has been well controlled with crestor 5 mg daily-. LDL is at goal on current statin dose, patient has no side effects from medication and LFTS are normal. No changes today.  repeat labs to verify that she remains at goal. The patient indicates understanding of these issues and agrees with the plan.  Lab Results  Component Value Date   CHOL 160 09/02/2018   HDL 64.20 09/02/2018   LDLCALC 74 09/02/2018   TRIG 107.0 09/02/2018   CHOLHDL 2 09/02/2018         Relevant Orders   Lipid panel   Comprehensive metabolic panel   TSH   T4, free   Type 2 diabetes mellitus with complication (HCC) - Primary    Blood sugars did increase while she was on prednisone while being treated for COVID PNA.  Medication compliance: compliant with Metformin  1000 mg twice daily most of the time, diabetic diet compliance: compliant most of the time, , further diabetic ROS: no polyuria or polydipsia, no chest pain, dyspnea or TIA's, no numbness, tingling or pain in extremities. Lab Results   Component Value Date   HGBA1C 7.6 (H) 09/02/2018   HGBA1C 7.6 (H) 09/02/2018   HGBA1C 6.4 (A) 03/25/2018   HGBA1C 6.4 03/25/2018   Lab Results  Component Value Date   MICROALBUR <0.7 09/02/2018   LDLCALC 74 09/02/2018   CREATININE 0.76 09/02/2018    Health Maintenance 1.  Patient is counseled on appropriate foot care. 2.  BP goal < 130/80. 3.  LDL goal of < 100, HDL > 40 and TG < 150. All diabetic should be on a statin unless contraindication. 4.  Eye Exam yearly and Dental Exam every 6 months. 5.  Dietary recommendations: < 100 g carbohydrates daily. 6.  Physical Activity recommendations: As tolerated aerobic and strength exercises.  7.  Appropriate vaccines reviewed.         Relevant Orders   Hemoglobin A1c   Bronchiectasis without complication (HCC)    Followed by pulmonary. Receiving infusion therapy.      Pneumonia due to COVID-19 virus    Evaluated by another provider on 02/10/19 and treated with levaquin 500 mg daily x 7 days. Doing a lot better- finished levaquin.  Has a little cough but otherwise doing okay- infusion therapy worked well for her.      Relevant Orders   CBC with Differential/Platelet      I am having Alexismarie B. Pricilla Holm maintain her fexofenadine, montelukast, fluticasone, albuterol, bifidobacterium infantis, multivitamin with minerals, budesonide-formoterol, PRESCRIPTION MEDICATION, nitrofurantoin (macrocrystal-monohydrate), hydrocortisone, Calcium Carbonate-Vitamin D, EPINEPHrine, omeprazole, aspirin EC, pantoprazole, Spiriva Respimat, ondansetron, FreeStyle Libre 14 Day Reader, FreeStyle Libre 14 Day Sensor, dupilumab, furosemide, rosuvastatin, erythromycin, neomycin-polymyxin-dexamethasone, metFORMIN, sodium chloride HYPERTONIC, predniSONE, predniSONE, and levofloxacin.  No orders of the defined types were placed in this encounter.    Ruthe Mannan, MD

## 2019-03-03 ENCOUNTER — Other Ambulatory Visit: Payer: Self-pay

## 2019-03-03 ENCOUNTER — Encounter: Payer: Self-pay | Admitting: Family Medicine

## 2019-03-03 ENCOUNTER — Telehealth (INDEPENDENT_AMBULATORY_CARE_PROVIDER_SITE_OTHER): Payer: BC Managed Care – PPO | Admitting: Family Medicine

## 2019-03-03 VITALS — BP 116/76 | HR 55 | Temp 97.8°F | Wt 180.0 lb

## 2019-03-03 DIAGNOSIS — E785 Hyperlipidemia, unspecified: Secondary | ICD-10-CM | POA: Diagnosis not present

## 2019-03-03 DIAGNOSIS — J1282 Pneumonia due to coronavirus disease 2019: Secondary | ICD-10-CM

## 2019-03-03 DIAGNOSIS — U071 COVID-19: Secondary | ICD-10-CM

## 2019-03-03 DIAGNOSIS — J479 Bronchiectasis, uncomplicated: Secondary | ICD-10-CM

## 2019-03-03 DIAGNOSIS — E118 Type 2 diabetes mellitus with unspecified complications: Secondary | ICD-10-CM | POA: Diagnosis not present

## 2019-03-03 NOTE — Assessment & Plan Note (Addendum)
Blood sugars did increase while she was on prednisone while being treated for COVID PNA.  Medication compliance: compliant with Metformin  1000 mg twice daily most of the time, diabetic diet compliance: compliant most of the time, , further diabetic ROS: no polyuria or polydipsia, no chest pain, dyspnea or TIA's, no numbness, tingling or pain in extremities. Lab Results  Component Value Date   HGBA1C 7.6 (H) 09/02/2018   HGBA1C 7.6 (H) 09/02/2018   HGBA1C 6.4 (A) 03/25/2018   HGBA1C 6.4 03/25/2018   Lab Results  Component Value Date   MICROALBUR <0.7 09/02/2018   LDLCALC 74 09/02/2018   CREATININE 0.76 09/02/2018    Health Maintenance 1.  Patient is counseled on appropriate foot care. 2.  BP goal < 130/80. 3.  LDL goal of < 100, HDL > 40 and TG < 150. All diabetic should be on a statin unless contraindication. 4.  Eye Exam yearly and Dental Exam every 6 months. 5.  Dietary recommendations: < 100 g carbohydrates daily. 6.  Physical Activity recommendations: As tolerated aerobic and strength exercises.  7.  Appropriate vaccines reviewed.

## 2019-03-03 NOTE — Assessment & Plan Note (Signed)
Has been well controlled with crestor 5 mg daily-. LDL is at goal on current statin dose, patient has no side effects from medication and LFTS are normal. No changes today.  repeat labs to verify that she remains at goal. The patient indicates understanding of these issues and agrees with the plan.  Lab Results  Component Value Date   CHOL 160 09/02/2018   HDL 64.20 09/02/2018   LDLCALC 74 09/02/2018   TRIG 107.0 09/02/2018   CHOLHDL 2 09/02/2018

## 2019-03-03 NOTE — Assessment & Plan Note (Signed)
Followed by pulmonary. Receiving infusion therapy.

## 2019-03-03 NOTE — Assessment & Plan Note (Addendum)
Evaluated by another provider on 02/10/19 and treated with levaquin 500 mg daily x 7 days. Doing a lot better- finished levaquin.  Has a little cough but otherwise doing okay- infusion therapy worked well for her.

## 2019-03-04 ENCOUNTER — Other Ambulatory Visit (INDEPENDENT_AMBULATORY_CARE_PROVIDER_SITE_OTHER): Payer: BC Managed Care – PPO

## 2019-03-04 DIAGNOSIS — E785 Hyperlipidemia, unspecified: Secondary | ICD-10-CM | POA: Diagnosis not present

## 2019-03-04 DIAGNOSIS — E118 Type 2 diabetes mellitus with unspecified complications: Secondary | ICD-10-CM | POA: Diagnosis not present

## 2019-03-04 DIAGNOSIS — U071 COVID-19: Secondary | ICD-10-CM

## 2019-03-04 DIAGNOSIS — J1282 Pneumonia due to coronavirus disease 2019: Secondary | ICD-10-CM | POA: Diagnosis not present

## 2019-03-04 LAB — CBC WITH DIFFERENTIAL/PLATELET
Basophils Absolute: 0 10*3/uL (ref 0.0–0.1)
Basophils Relative: 0.7 % (ref 0.0–3.0)
Eosinophils Absolute: 0.1 10*3/uL (ref 0.0–0.7)
Eosinophils Relative: 2.7 % (ref 0.0–5.0)
HCT: 35 % — ABNORMAL LOW (ref 36.0–46.0)
Hemoglobin: 11.6 g/dL — ABNORMAL LOW (ref 12.0–15.0)
Lymphocytes Relative: 25.9 % (ref 12.0–46.0)
Lymphs Abs: 1.2 10*3/uL (ref 0.7–4.0)
MCHC: 33 g/dL (ref 30.0–36.0)
MCV: 83.1 fl (ref 78.0–100.0)
Monocytes Absolute: 0.4 10*3/uL (ref 0.1–1.0)
Monocytes Relative: 9.7 % (ref 3.0–12.0)
Neutro Abs: 2.8 10*3/uL (ref 1.4–7.7)
Neutrophils Relative %: 61 % (ref 43.0–77.0)
Platelets: 169 10*3/uL (ref 150.0–400.0)
RBC: 4.22 Mil/uL (ref 3.87–5.11)
RDW: 17.2 % — ABNORMAL HIGH (ref 11.5–15.5)
WBC: 4.5 10*3/uL (ref 4.0–10.5)

## 2019-03-04 LAB — COMPREHENSIVE METABOLIC PANEL
ALT: 14 U/L (ref 0–35)
AST: 17 U/L (ref 0–37)
Albumin: 4.2 g/dL (ref 3.5–5.2)
Alkaline Phosphatase: 53 U/L (ref 39–117)
BUN: 10 mg/dL (ref 6–23)
CO2: 30 mEq/L (ref 19–32)
Calcium: 9.4 mg/dL (ref 8.4–10.5)
Chloride: 101 mEq/L (ref 96–112)
Creatinine, Ser: 0.65 mg/dL (ref 0.40–1.20)
GFR: 92.85 mL/min (ref >60.00)
Glucose, Bld: 155 mg/dL — ABNORMAL HIGH (ref 70–99)
Potassium: 3.5 mEq/L (ref 3.5–5.1)
Sodium: 139 mEq/L (ref 135–145)
Total Bilirubin: 0.5 mg/dL (ref 0.2–1.2)
Total Protein: 7.1 g/dL (ref 6.0–8.3)

## 2019-03-04 LAB — LIPID PANEL
Cholesterol: 155 mg/dL (ref 0–200)
HDL: 68.4 mg/dL (ref >39.00)
LDL Cholesterol: 71 mg/dL (ref 0–99)
NonHDL: 86.14
Total CHOL/HDL Ratio: 2
Triglycerides: 76 mg/dL (ref 0.0–149.0)
VLDL: 15.2 mg/dL (ref 0.0–40.0)

## 2019-03-04 LAB — HEMOGLOBIN A1C: Hgb A1c MFr Bld: 8.1 % — ABNORMAL HIGH (ref 4.6–6.5)

## 2019-03-04 LAB — TSH: TSH: 1.05 u[IU]/mL (ref 0.35–4.50)

## 2019-03-04 LAB — T4, FREE: Free T4: 1.02 ng/dL (ref 0.60–1.60)

## 2019-03-05 ENCOUNTER — Encounter: Payer: Self-pay | Admitting: Family Medicine

## 2019-03-10 ENCOUNTER — Other Ambulatory Visit: Payer: Self-pay | Admitting: Pulmonary Disease

## 2019-03-11 ENCOUNTER — Encounter: Payer: Self-pay | Admitting: Physician Assistant

## 2019-03-12 NOTE — Progress Notes (Signed)
Cardiology Office Note  Date: 03/13/2019   ID: Karen Barnes, DOB January 21, 1961, MRN [id number]  PCP:  Dianne Dun, MD  Cardiologist:  Tobias Alexander, MD Electrophysiologist:  None   No chief complaint on file.   History of Present Illness: Karen Barnes is a 59 y.o. female last encounter with Dr. Delton See August 20, 2018.  History of diabetes, hypertension, hyperlipidemia, palpitations, previous subdural hematoma secondary to fall in 2018, asthma, PVCs controlled with diltiazem.  Patient has chest pain syndrome likely related to GERD with possible additional exacerbation of symptoms with initiation of Cardizem for palpitations.  Previous 30-day event monitor in March 2019 showing PACs and PVCs with episodes of atrial tachycardia.  Dr. Graciela Husbands discontinued her Cardizem and she continued to have PVCs mostly during the night which were not bothersome.  She preferred not to be started on another medication unless necessary.  Right heart cath in October 2019 no evidence of PAH and only mild pulmonary hypertension secondary to diastolic heart failure most likely.    At a prior visit she had been walking 5 times a week for at least 2 miles and reported no shortness of breath, or chest pain with activity.  Her most bothersome problem was asthma during the springtime of 2020 having to have several rounds of antibiotics and steroids.  She was started on Dupixent and therapy vest for bronchiectasis and described feeling better after starting therapy. She was taking Lasix frequently for bilateral lower extremity edema.  She recently contracted SARS-Cov2 virus from her husband in December and received monoclonal antibody therapy with improvement of symptoms. She states she feels she has nearly recovered but still has some residual dyspnea when ascending stairs.  She states previous episodes of palpitations are now rare.Voices she may have had two brief short lived episodes in the last 6 months and  was asymptomatic.  LE  Edema has improved with use of Lasix  as needed.    Past Medical History:  Diagnosis Date  . Asthma, moderate persistent    sees Dr Irena Cords  . Atrial tachycardia (HCC)   . Chronic diastolic CHF (congestive heart failure) (HCC)   . COVID-19 virus infection 01/2019  . Diabetes mellitus    A1C 6.2---->  2009  . Family history of adverse reaction to anesthesia    sister had cardiac arrest with rhinoplasty  . Fibrocystic breast   . GERD (gastroesophageal reflux disease)   . HH (hiatus hernia) 09/2014   Dr. Ewing Schlein  . History of echocardiogram    Echo 5/19: EF 55-60, normal wall motion, PASP 41  . History of hiatal hernia   . History of nuclear stress test    Nuclear stress test 5/19:  EF 64, normal perfusion, Low risk  . Hot flashes   . Hyperlipidemia   . Premature atrial contractions   . Pulmonary hypertension (HCC)   . PVC's (premature ventricular contractions)   . Recurrent cystitis    took  postcoital antibiotic for years, symptoms better after her hysterectomy 1996, symptoms re-surface in 2011  . RUQ abdominal pain 2016   EGD showed small HH, otherwise normal  . Wears glasses     Past Surgical History:  Procedure Laterality Date  . ABDOMINAL HYSTERECTOMY  1996   no oophorectomy, d/tendometriosis and fibroids approximately 1996  . BREAST EXCISIONAL BIOPSY Right 03/12/2014   [facility]  . BREAST LUMPECTOMY WITH RADIOACTIVE SEED LOCALIZATION Right 03/12/2014   Procedure: BREAST LUMPECTOMY WITH RADIOACTIVE SEED LOCALIZATION;  Surgeon:  Harriette Bouillon, MD;  Location: [facility];  Service: General;  Laterality: Right;  . BREAST SURGERY     Lumpectomy-- BENIGN   . CATARACT EXTRACTION Bilateral 2011  . CESAREAN SECTION    . COSMETIC SURGERY  01/2017   MonaLisa Touch w/ GYN  . DILATION AND CURETTAGE OF UTERUS    . LIPOMA EXCISION  1978  . RIGHT HEART CATH N/A 11/26/2017   Procedure: RIGHT HEART CATH;  Surgeon: Laurey Morale, MD;  Location:  [facility] INVASIVE CV LAB;  Service: Cardiovascular;  Laterality: N/A;  . UPPER GASTROINTESTINAL ENDOSCOPY    . UPPER GI ENDOSCOPY  09/11/2014   Small Hiatus Hernia, Dr. Ewing Schlein    Current Outpatient Medications  Medication Sig Dispense Refill  . albuterol (PROVENTIL HFA;VENTOLIN HFA) 108 (90 BASE) MCG/ACT inhaler Inhale 2 puffs into the lungs every 6 (six) hours as needed.    Marland Kitchen aspirin EC 81 MG tablet Take 81 mg by mouth daily.    . bifidobacterium infantis (ALIGN) capsule Take 1 capsule by mouth daily.    . budesonide-formoterol (SYMBICORT) 160-4.5 MCG/ACT inhaler Inhale 2 puffs into the lungs 2 (two) times daily.    . Calcium Carbonate-Vitamin D (CALCIUM 600+D) 600-200 MG-UNIT TABS Take 1 tablet by mouth daily.    . Continuous Blood Gluc Receiver (FREESTYLE LIBRE 14 DAY READER) DEVI USE AS DIRECTED 1 Device 0  . Continuous Blood Gluc Sensor (FREESTYLE LIBRE 14 DAY SENSOR) MISC USE AS DIRECTED PER MD 2 each 5  . Dupilumab (DUPIXENT) 300 MG/2ML SOSY Inject 300 mg into the skin every 14 (fourteen) days. 4 mL 11  . EPINEPHrine (AUVI-Q) 0.3 mg/0.3 mL IJ SOAJ injection Inject 0.3 mg into the muscle as directed. For immunotherapy (allergy shots) anaphylactic reactions    . fexofenadine (ALLEGRA) 180 MG tablet Take 180 mg by mouth every morning.     . fluticasone (FLONASE) 50 MCG/ACT nasal spray Place 2 sprays into both nostrils 2 (two) times daily.     . furosemide (LASIX) 20 MG tablet TAKE 1 TABLET(20 MG) BY MOUTH DAILY AS NEEDED FOR FLUID RETENTION OR SWELLING 30 tablet 8  . hydrocortisone 2.5 % cream APPLY EXTERNALLY TO THE AFFECTED AREA TWICE DAILY AS NEEDED (Patient taking differently: Apply 1 application topically 2 (two) times daily as needed (for skin irritation/rash). ) 30 g 0  . metFORMIN (GLUCOPHAGE) 1000 MG tablet Take 1 tablet (1,000 mg total) by mouth 2 (two) times daily. 180 tablet 1  . montelukast (SINGULAIR) 10 MG tablet Take 10 mg by mouth at bedtime.      . Multiple Vitamins-Minerals  (MULTIVITAMIN WITH MINERALS) tablet Take 1 tablet by mouth daily.    . nitrofurantoin, macrocrystal-monohydrate, (MACROBID) 100 MG capsule TAKE AS DIRECTED POST COITAL 30 capsule 0  . omeprazole (PRILOSEC) 40 MG capsule Take 40 mg by mouth at bedtime.    . ondansetron (ZOFRAN-ODT) 4 MG disintegrating tablet DISSOLVE 1 TABLET BY MOUTH EVERY 8 HOURS AS NEEDED FOR NAUSEA OR VOMITING 30 tablet 5  . pantoprazole (PROTONIX) 40 MG tablet TAKE 1 TABLET(40 MG) BY MOUTH DAILY 90 tablet 3  . PRESCRIPTION MEDICATION Allergy Shots    . rosuvastatin (CRESTOR) 5 MG tablet TAKE 1 TABLET(5 MG) BY MOUTH DAILY 90 tablet 1  . sodium chloride HYPERTONIC 3 % nebulizer solution INHALE CONTENTS OF 1 VIAL VIA NEBULIZER TWICE DAILY AS DIRECTED. 240 mL 0  . SPIRIVA RESPIMAT 1.25 MCG/ACT AERS INHALE 2 PUFF BY MOUTH EVERY DAY 4 g 11  No current facility-administered medications for this visit.   Allergies:  Butorphanol tartrate, Hydrocodone, Roxicet [oxycodone-acetaminophen], and Penicillins   Social History: The patient  reports that she has never smoked. She has never used smokeless tobacco. She reports that she does not drink alcohol or use drugs.   Family History: The patient's family history includes Brain cancer in an other family member; Breast cancer (age of onset: 25) in her maternal aunt; Breast cancer (age of onset: 65) in her mother; CAD (age of onset: 30) in her paternal grandfather and paternal grandmother; Dementia in her mother; Diabetes in her father; Heart disease (age of onset: 52) in her father; Kidney failure in her father; Lung cancer in an other family member; Mitral valve prolapse in her mother; Sleep apnea in her father.   ROS:  Please see the history of present illness. Otherwise, complete review of systems is positive for none.  All other systems are reviewed and negative.   Physical Exam: VS:  BP 132/86   Pulse 97   Ht 5\' 4"  (1.626 m)   Wt 183 lb (83 kg)   SpO2 98%   BMI 31.41 kg/m , BMI  Body mass index is 31.41 kg/m.  Wt Readings from Last 3 Encounters:  03/13/19 183 lb (83 kg)  03/03/19 180 lb (81.6 kg)  02/06/19 173 lb (78.5 kg)    General: Patient appears comfortable at rest. Neck: Supple, no elevated JVP or carotid bruits, no thyromegaly. Lungs: Clear to auscultation, nonlabored breathing at rest. Cardiac: Regular rate and rhythm, no S3 or significant systolic murmur, no pericardial rub. Extremities: No pitting edema, distal pulses 2+. Skin: Warm and dry. Neuropsychiatric: Alert and oriented x3, affect grossly appropriate.  ECG:  An ECG dated 03/13/2019 was personally reviewed today and demonstrated:  Normal sinus rhythm rate of 97, no ST or T wave changes noted.  Recent Labwork: 03/04/2019: ALT 14; AST 17; BUN 10; Creatinine, Ser 0.65; Hemoglobin 11.6; Platelets 169.0; Potassium 3.5; Sodium 139; TSH 1.05     Component Value Date/Time   CHOL 155 03/04/2019 0801   TRIG 76.0 03/04/2019 0801   HDL 68.40 03/04/2019 0801   CHOLHDL 2 03/04/2019 0801   VLDL 15.2 03/04/2019 0801   LDLCALC 71 03/04/2019 0801    Other Studies Reviewed Today:  Non telemetry monitoring May 03, 2017 The patient was enrolled for 20 days during which 32 total transmissions were posted. The initial Baseline transmission shows SINUS RHYTHM The high average heart rate seen for the monitored period was 130 BPM. The low average heart rate seen for the monitored period was 70 BPM. 30 manually-triggered recording(s) posted with symptoms including Chest pain, Other. No Pauses noted for 3 seconds or longer. For this report the ECG findings include: SINUS TACHYCARDIA, SINUS RHYTHM, VENTRICULAR COUPLET(S), PAC(S), ATRIAL RUN(S)  Echocardiogram Jun 07, 2017 Study Conclusions  - Left ventricle: The cavity size was normal. Wall thickness was  normal. Systolic function was normal. The estimated ejection  fraction was in the range of 55% to 60%. Wall motion was normal; there were no regional  wall motion abnormalities. Left  ventricular diastolic function parameters were normal.  - Pulmonary arteries: Systolic pressure was mildly increased. PA peak pressure: 41 mm Hg (S).  Impressions:  - Normal LV systolic and diastolic function; mild pulmonary hypertension.   Nuclear Myoview stress test May 2019  Nuclear stress EF: 64%.  Blood pressure demonstrated a normal response to exercise.  There was no ST segment deviation noted during stress.  No T wave inversion was noted during stress.  The study is normal.  This is a low risk study.  Low risk stress nuclear study with normal perfusion and normal left ventricular regional and global systolic function.   Right heart catheterization November 26, 2017. 1. Mildly elevated PCWP, suspect LV diastolic dysfunction.  2. 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   PCWP elevation is mild, would restrict sodium intake.  She does not have pulmonary arterial hypertension  PFTs November 13, 2017 The FVC is reduced, but the FEV1/FVC ratio is increased. The airway resistance is normal. Following administration of bronchodilators, there is no significant response. The diffusing capacity is normal. Conclusions: The results are within normal limits. Pulmonary Function Diagnosis: Normal Pulmonary Function  Assessment and Plan   1. Palpitations Patient states she has had only two episodes of palpitations that were brief / transient in the last 6 months. She denied symptoms during these episodes. If increase in palpitations in the future may need event or holter monitor to evaluate.   2. Pulmonary HTN (HCC) Mild pulmonary HTN on echo May 2019 with peak pressure 41 mmHg. Likely d/t OSA and Asthma. PFT's were normal.  RHC October 2019 showed no evidence of PAH. Will continue to monitor compliance with asthma therapy and CPAP therapy.   3. Lower extremity edema Patient has mild non pitting bilateral lower extremity  edema. Continue Lasix 20 mg as needed for fluid retention and edema.   4. Hyperlipidemia, unspecified hyperlipidemia type Recent lipid panel 03/04/2019: Total cholesterol 155, triglycerides 76, HDL 68.4, VLDL 15.2, LDL 71.  Continue Crestor 5 mg daily.   Medication Adjustments/Labs and Tests Ordered: Current medicines are reviewed at length with the patient today.  Concerns regarding medicines are outlined above.    Patient Instructions  Medication Instructions:  Your physician recommends that you continue on your current medications as directed. Please refer to the Current Medication list given to you today.  *If you need a refill on your cardiac medications before your next appointment, please call your pharmacy*  Lab Work: None ordered  If you have labs (blood work) drawn today and your tests are completely normal, you will receive your results only by: Marland Kitchen MyChart Message (if you have MyChart) OR . A paper copy in the mail If you have any lab test that is abnormal or we need to change your treatment, we will call you to review the results.  Testing/Procedures: None ordered  Follow-Up: At [facility], you and your health needs are our priority.  As part of our continuing mission to provide you with exceptional heart care, we have created designated Provider Care Teams.  These Care Teams include your primary Cardiologist (physician) and Advanced Practice Providers (APPs -  Physician Assistants and Nurse Practitioners) who all work together to provide you with the care you need, when you need it.  Your next appointment:   6 month(s)  The format for your next appointment:   In Person  Provider:   You may see Tobias Alexander, MD or one of the following Advanced Practice Providers on your designated Care Team:    Ronie Spies, PA-C  Jacolyn Reedy, PA-C            Signed, Rennis Harding, NP 03/13/2019 10:23 PM    [facility]  [address]  [address] [place], [place] [address] Phone [phone]

## 2019-03-13 ENCOUNTER — Encounter: Payer: Self-pay | Admitting: Physician Assistant

## 2019-03-13 ENCOUNTER — Ambulatory Visit: Payer: BC Managed Care – PPO | Admitting: Family Medicine

## 2019-03-13 ENCOUNTER — Other Ambulatory Visit: Payer: Self-pay

## 2019-03-13 VITALS — BP 132/86 | HR 97 | Ht 64.0 in | Wt 183.0 lb

## 2019-03-13 DIAGNOSIS — R002 Palpitations: Secondary | ICD-10-CM

## 2019-03-13 DIAGNOSIS — I272 Pulmonary hypertension, unspecified: Secondary | ICD-10-CM | POA: Diagnosis not present

## 2019-03-13 DIAGNOSIS — R600 Localized edema: Secondary | ICD-10-CM

## 2019-03-13 DIAGNOSIS — E785 Hyperlipidemia, unspecified: Secondary | ICD-10-CM

## 2019-03-13 NOTE — Patient Instructions (Addendum)
Medication Instructions:  Your physician recommends that you continue on your current medications as directed. Please refer to the Current Medication list given to you today.  *If you need a refill on your cardiac medications before your next appointment, please call your pharmacy*  Lab Work: None ordered  If you have labs (blood work) drawn today and your tests are completely normal, you will receive your results only by: . MyChart Message (if you have MyChart) OR . A paper copy in the mail If you have any lab test that is abnormal or we need to change your treatment, we will call you to review the results.  Testing/Procedures: None ordered  Follow-Up: At [facility], you and your health needs are our priority.  As part of our continuing mission to provide you with exceptional heart care, we have created designated Provider Care Teams.  These Care Teams include your primary Cardiologist (physician) and Advanced Practice Providers (APPs -  Physician Assistants and Nurse Practitioners) who all work together to provide you with the care you need, when you need it.  Your next appointment:   6 month(s)  The format for your next appointment:   In Person  Provider:   You may see Katarina Nelson, MD or one of the following Advanced Practice Providers on your designated Care Team:    Dayna Dunn, PA-C  Michele Lenze, PA-C     

## 2019-03-31 ENCOUNTER — Other Ambulatory Visit: Payer: Self-pay | Admitting: Cardiology

## 2019-03-31 ENCOUNTER — Other Ambulatory Visit: Payer: Self-pay

## 2019-03-31 DIAGNOSIS — K219 Gastro-esophageal reflux disease without esophagitis: Secondary | ICD-10-CM

## 2019-03-31 MED ORDER — PANTOPRAZOLE SODIUM 40 MG PO TBEC: DELAYED_RELEASE_TABLET | ORAL | 3 refills | Status: DC

## 2019-04-04 IMAGING — DX DG CHEST 2V
2 series · 2 of 2 positions shown · non-contrast
Comparison: 10/03/2017

CLINICAL DATA: Chronic cough.

EXAM:
CHEST - 2 VIEW

[chest pa]
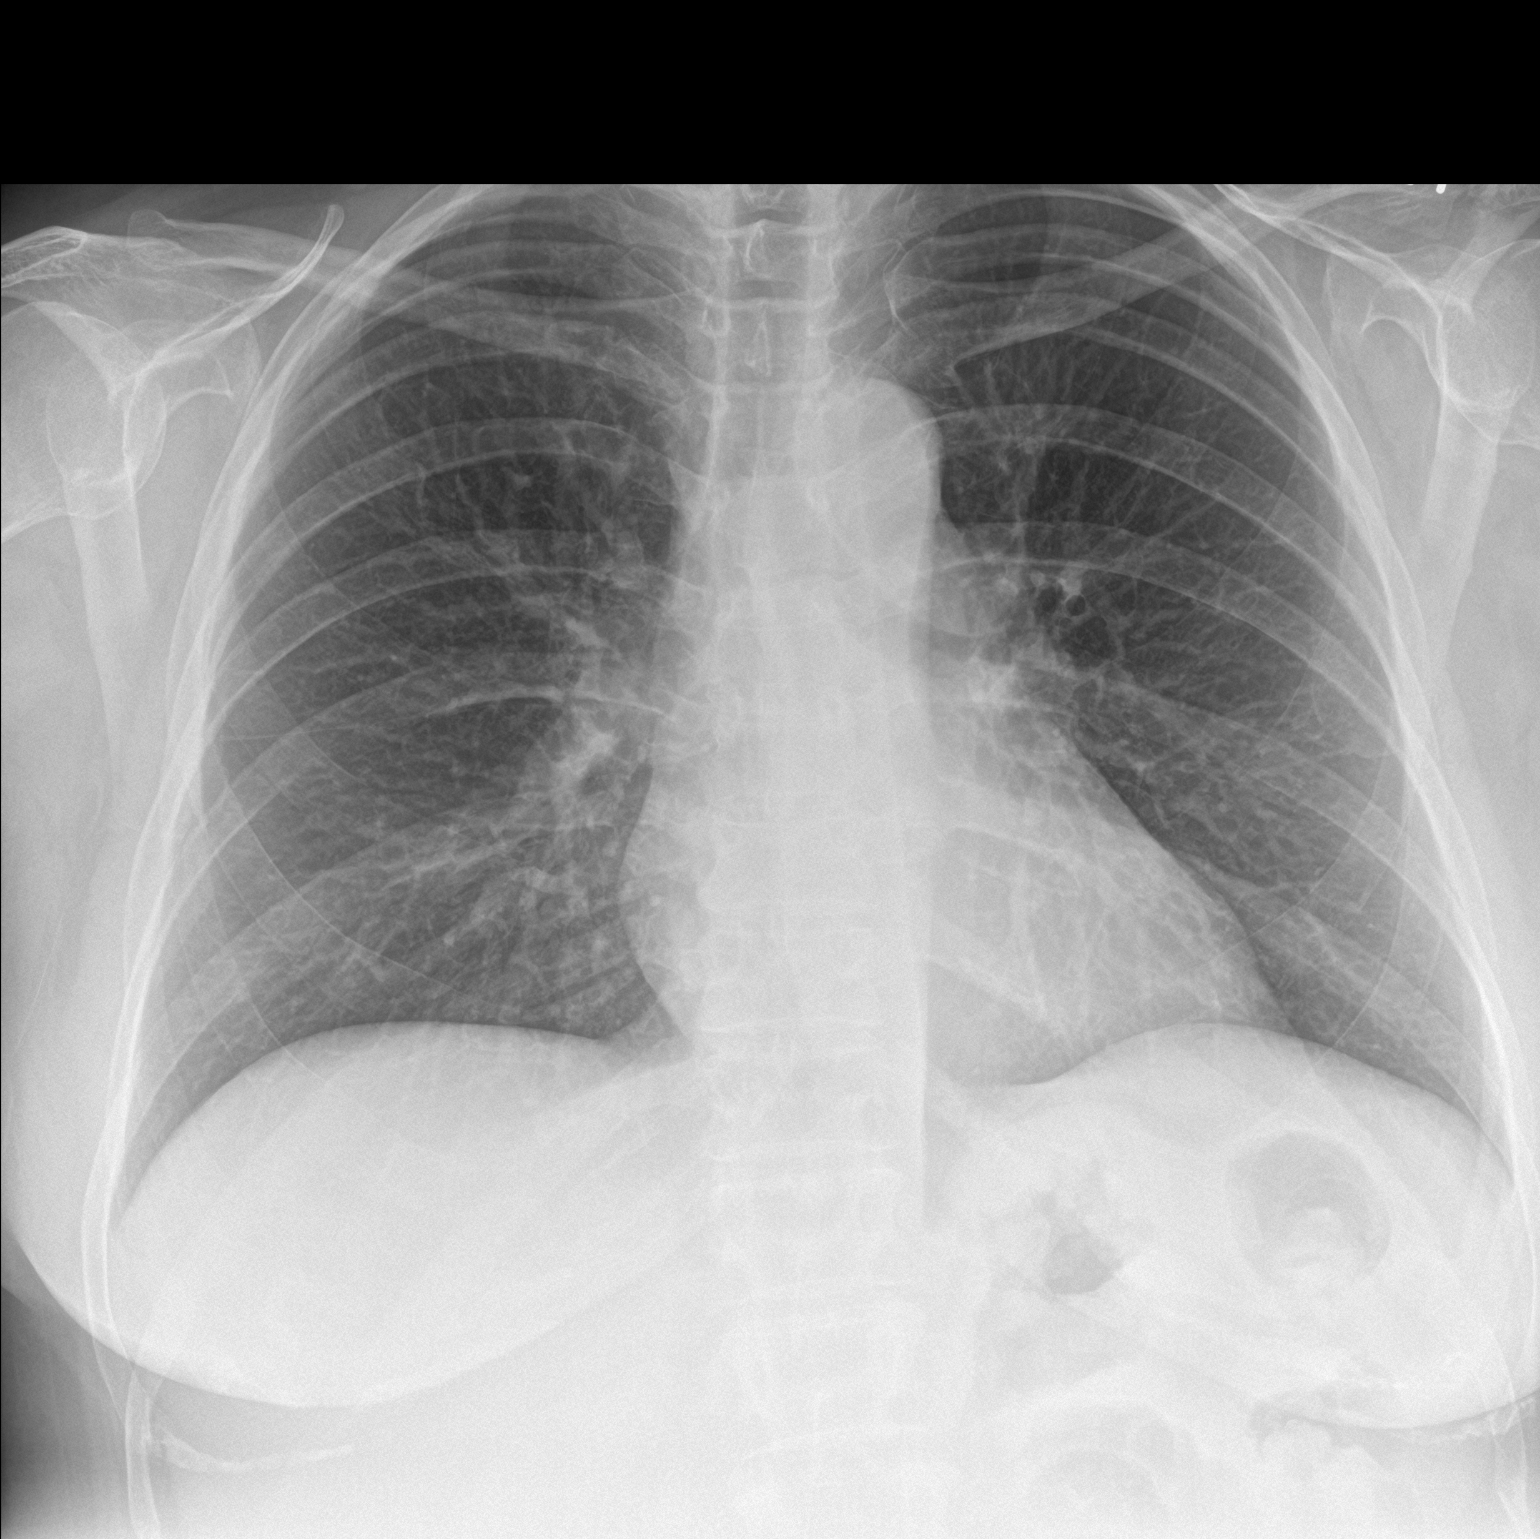

[chest lat]
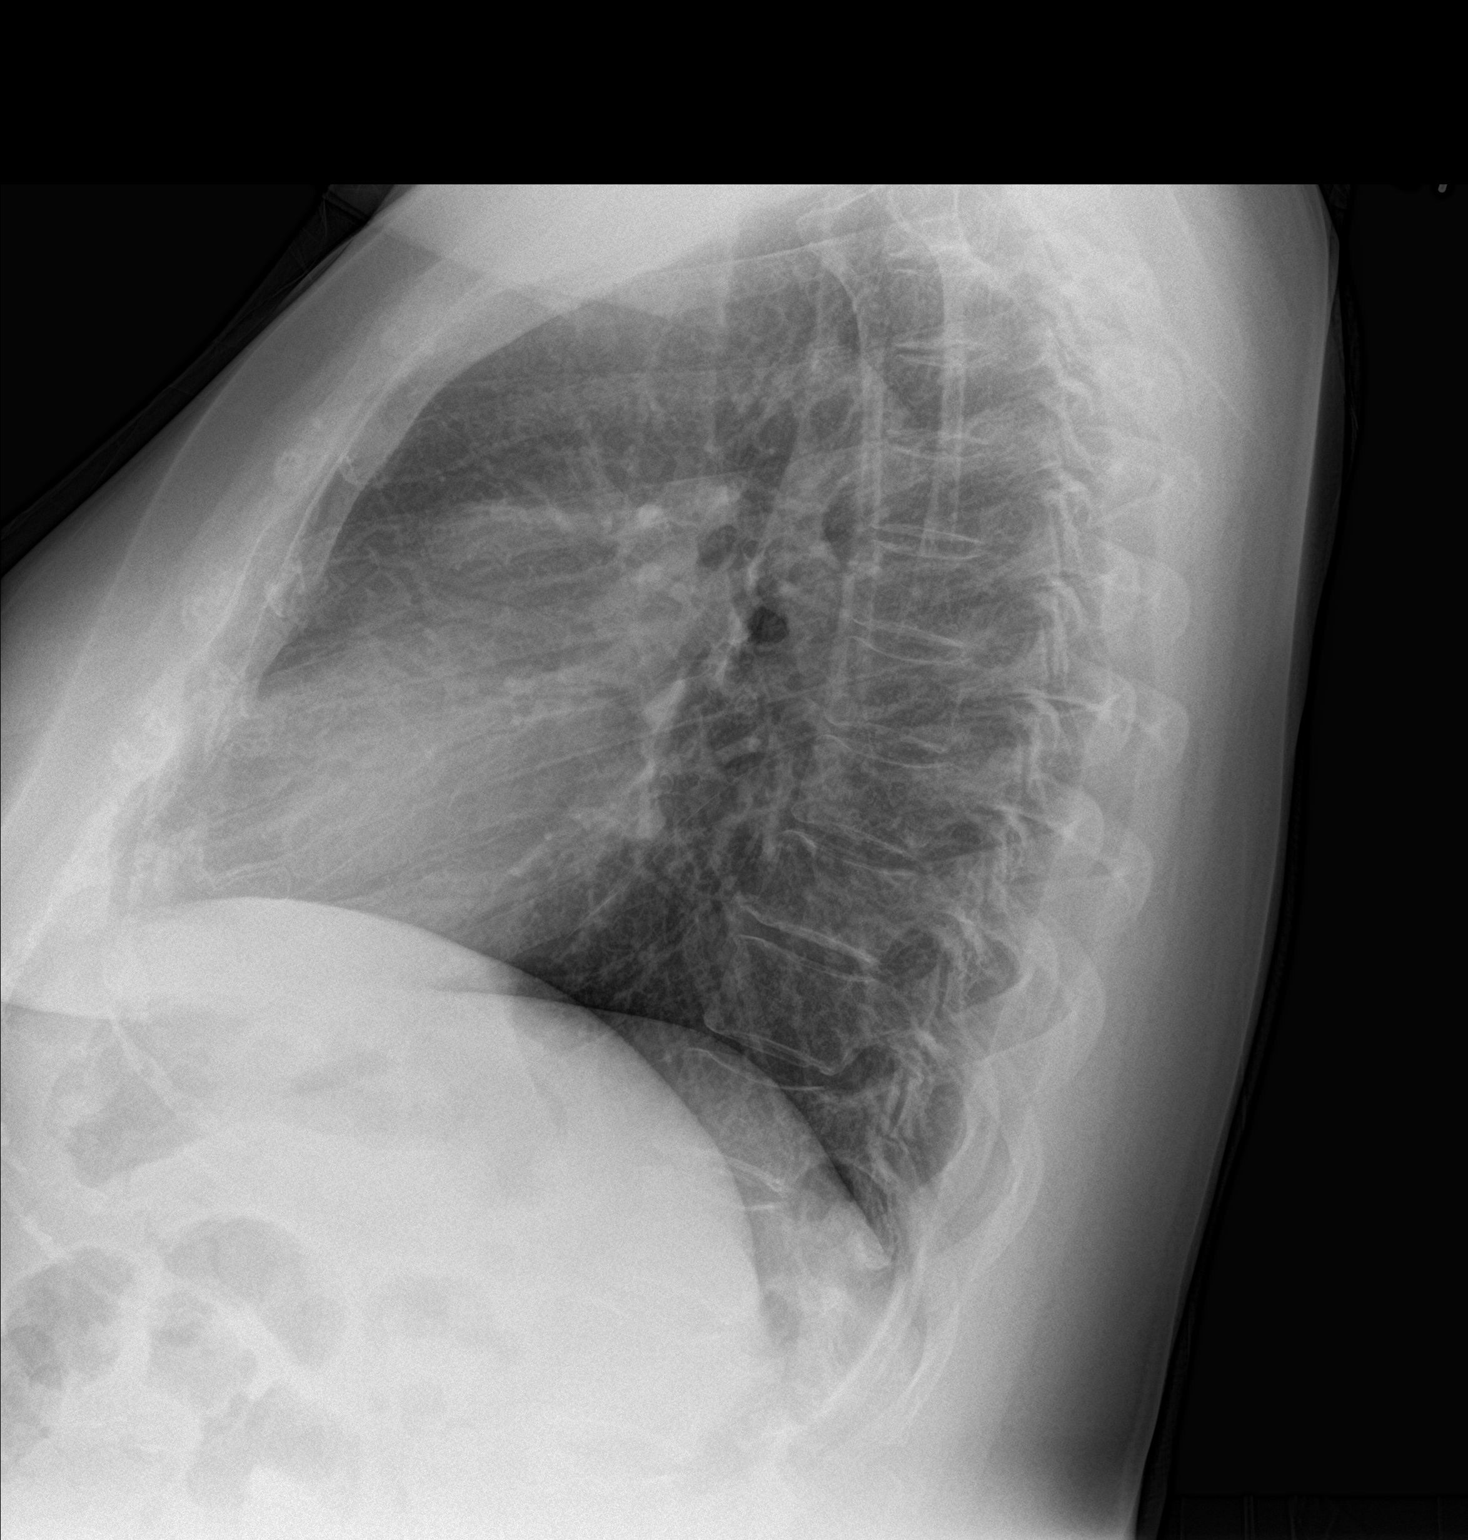

[2 of 2 positions shown; findings below may reference images not displayed]

FINDINGS: Heart is normal size. Mediastinal contours are within normal limits.
Lungs are clear. No effusions. No acute bony abnormality.
IMPRESSION: No active cardiopulmonary disease.

## 2019-04-10 ENCOUNTER — Other Ambulatory Visit: Payer: Self-pay | Admitting: Pulmonary Disease

## 2019-05-06 IMAGING — MR MR BILATERAL BREAST WITHOUT AND WITH CONTRAST
7 of 9 series · 32 of 48 positions shown · IV contrast (16 ml multihance)
Comparison: Previous exam(s).

CLINICAL DATA: 57-year-old patient presents for screening breast
MRI. Family history of breast cancer in her mother and a maternal
aunt.. History of excisional biopsy of the right breast for complex
sclerosing lesion.

LABS:  Creatinine was obtained on site at [HOSPITAL] at [REDACTED] [HOSPITAL].
Results: Creatinine 0.7 mg/dL.
EXAM:
BILATERAL BREAST MRI WITH AND WITHOUT CONTRAST
TECHNIQUE: Multiplanar, multisequence MR images of both breasts were obtained
prior to and following the intravenous administration of 16 ml of
MultiHance.

[Series 4: t2_tirm_tra ipat (a-p) · axial · 3.0mm · 0.70mm/px · z∈[-38,+124]mm · 2 of 55 slices shown]
[im 1/55]
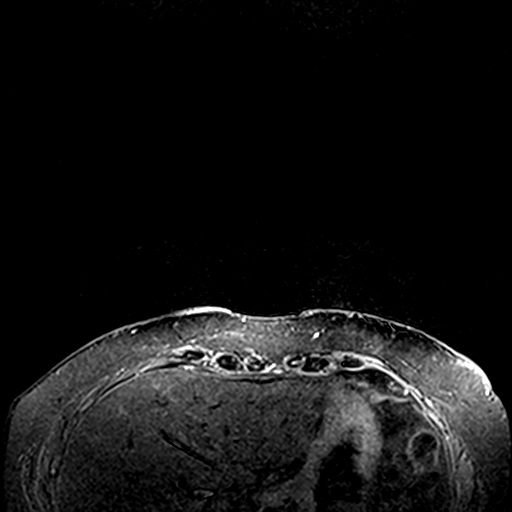
[im 55/55]
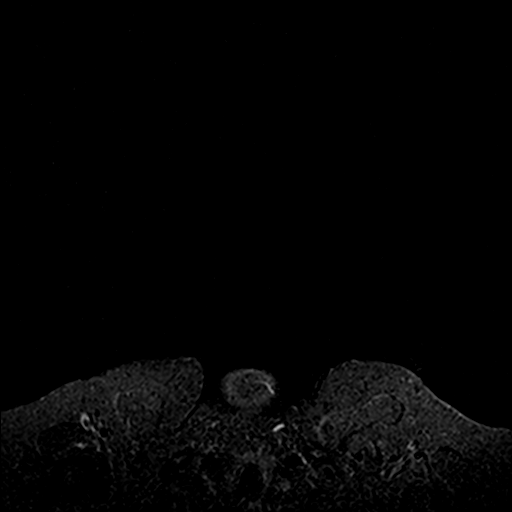

[Series 6: fl3d pre-cm · axial · non-contrast · 1.2mm · 0.94mm/px · z∈[-43,+128]mm · 7 of 144 slices shown]
[im 1/144]
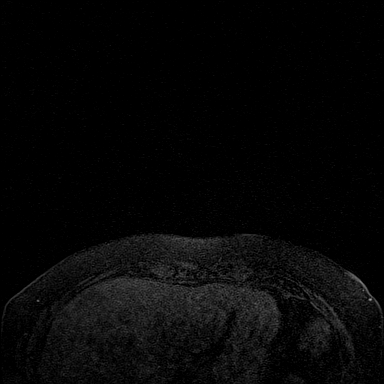
[im 24/144]
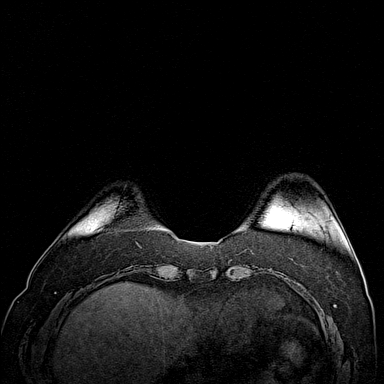
[im 48/144]
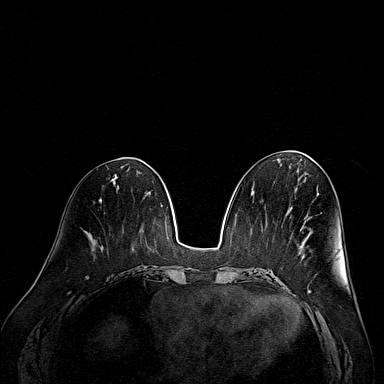
[im 72/144]
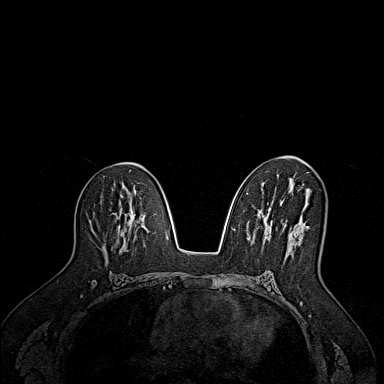
[im 96/144]
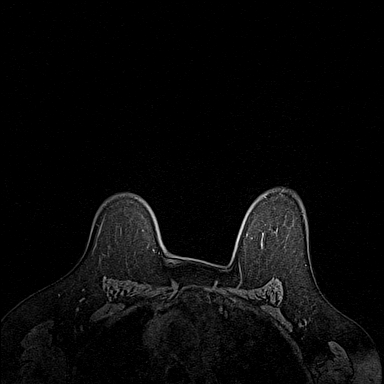
[im 120/144]
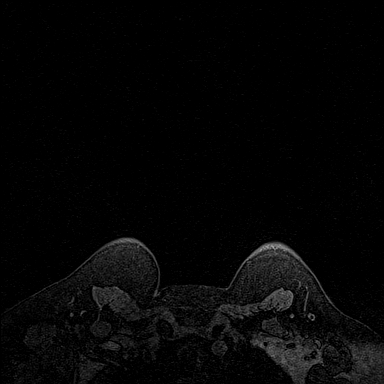
[im 144/144]
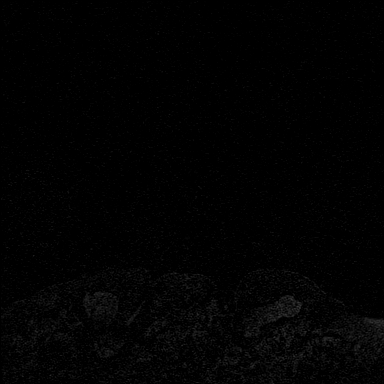

[Series 7: fl3d post immediate · axial · 1.2mm · 0.94mm/px · z∈[-43,+128]mm · 7 of 144 slices shown (1 of 3)]
[im 1/144]
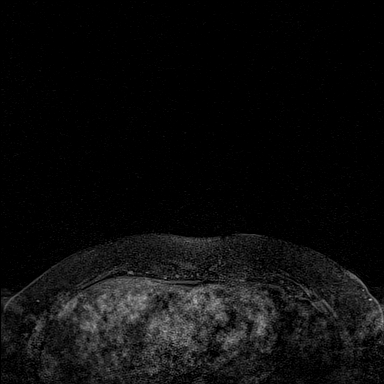
[im 24/144]
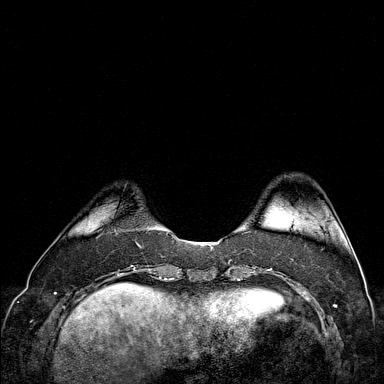
[im 48/144]
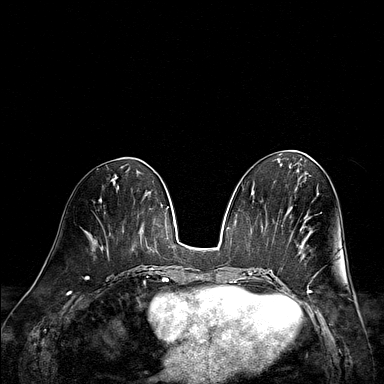
[im 72/144]
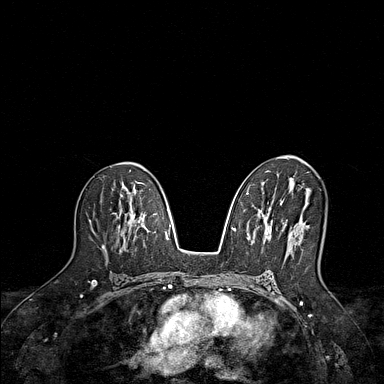
[im 96/144]
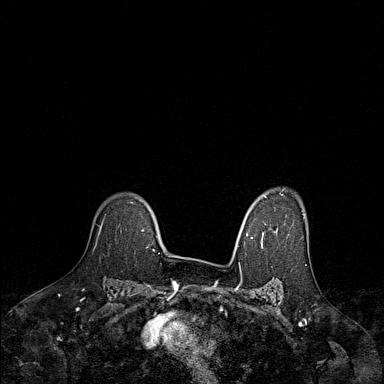
[im 120/144]
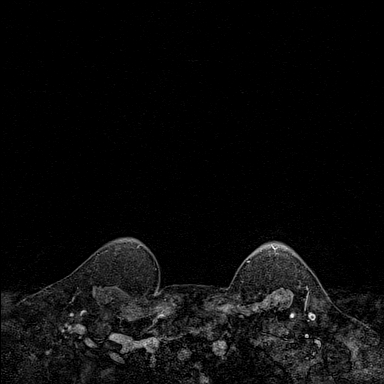
[im 144/144]
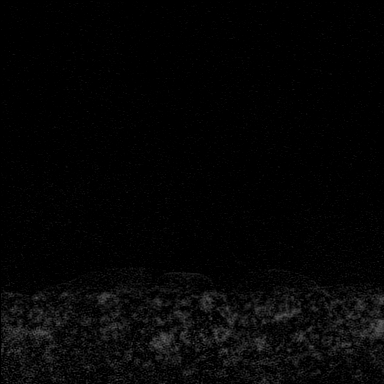

[Series 8: fl3d post immediate · axial · 1.2mm · 0.94mm/px · z∈[-43,+128]mm · 7 of 144 slices shown (2 of 3)]
[im 1/144]
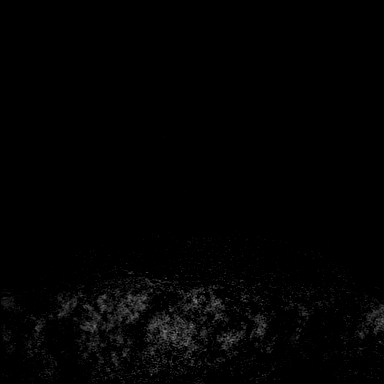
[im 24/144]
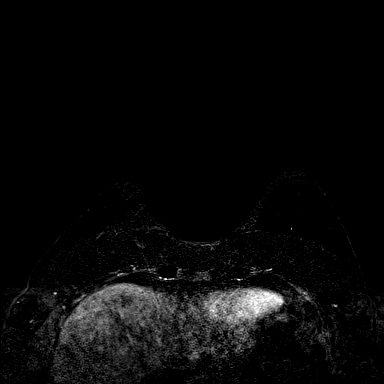
[im 48/144]
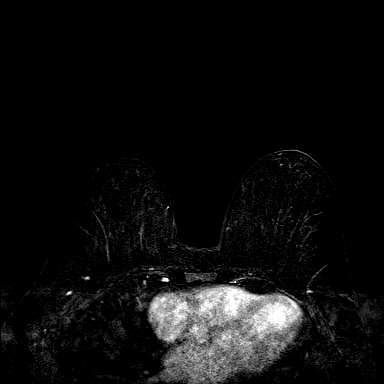
[im 72/144]
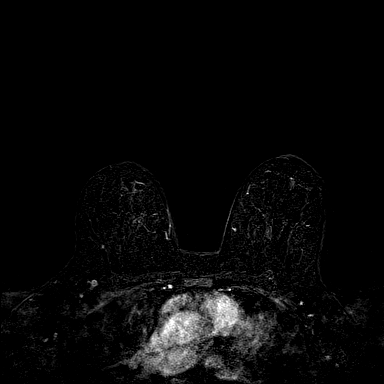
[im 96/144]
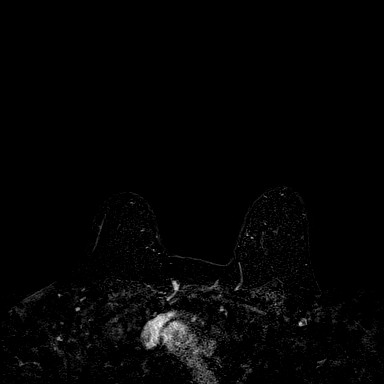
[im 120/144]
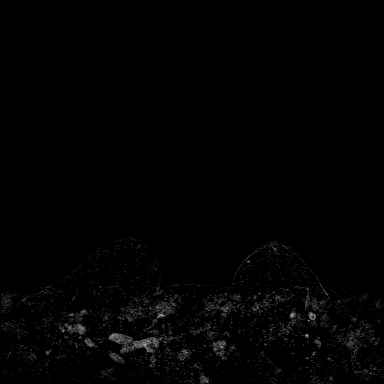
[im 144/144]
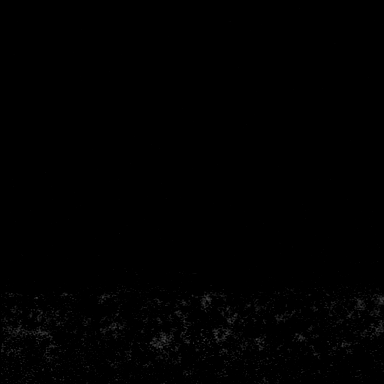

[Series 9: fl3d post immediate · axial · 172.8mm · 0.94mm/px · 1 of 1 slices shown (3 of 3)]
[im 1/1]
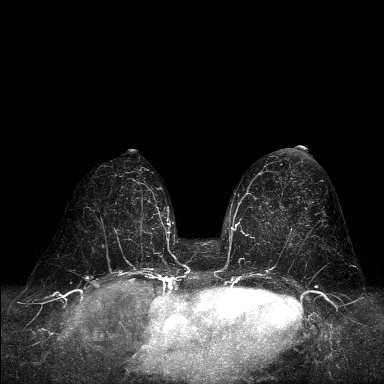

[Series 10: fl3d post 3min · axial · 1.2mm · 0.94mm/px · z∈[-43,+128]mm · 7 of 144 slices shown]
[im 1/144]
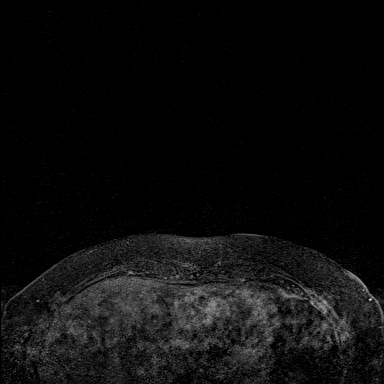
[im 24/144]
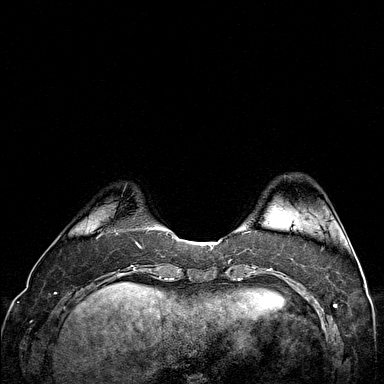
[im 48/144]
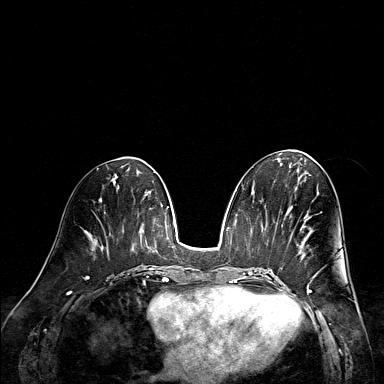
[im 72/144]
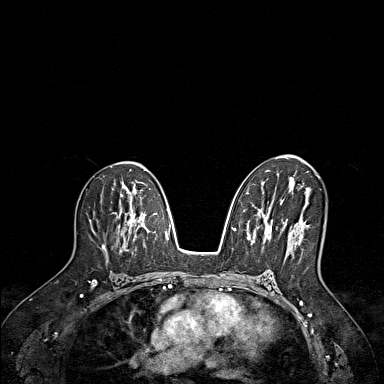
[im 96/144]
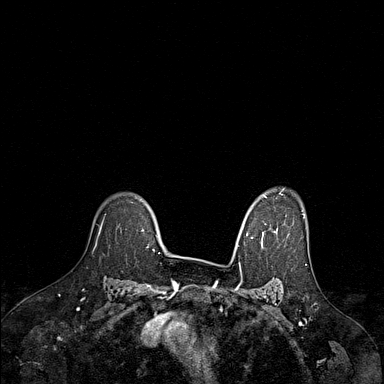
[im 120/144]
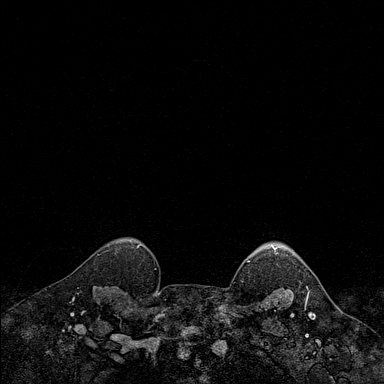
[im 144/144]
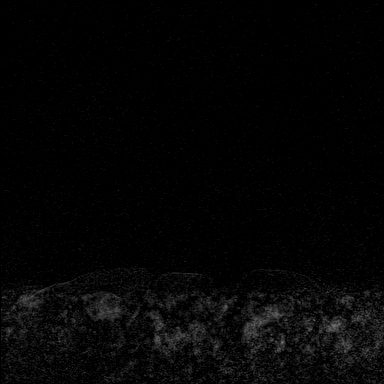

[Series 11: fl3d post 3min_sub · axial · 1.2mm · 0.94mm/px · 1 of 144 slices shown]
[im 1/144]
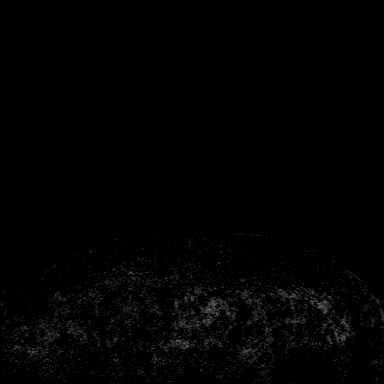

[32 of 48 positions shown; findings below may reference images not displayed]

Three-dimensional MR images were rendered by post-processing of the
original MR data on an independent workstation. The
three-dimensional MR images were interpreted, and findings are
reported in the following complete MRI report for this study. Three
dimensional images were evaluated at the independent [employer]
workstation
FINDINGS: Breast composition: b. Scattered fibroglandular tissue.

Background parenchymal enhancement: Mild

Right breast: No mass or abnormal enhancement.

Left breast: No mass or abnormal enhancement.

Lymph nodes: No abnormal appearing lymph nodes.

Ancillary findings:  None.
IMPRESSION: No MRI evidence of malignancy in either breast.

RECOMMENDATION:
Bilateral screening mammogram is recommended in January 2018.

Consider screening breast MRI in 1 year if the patient's calculated
lifetime risk of developing breast cancer is greater than 20%.

BI-RADS CATEGORY  1: Negative.

## 2019-05-20 ENCOUNTER — Other Ambulatory Visit: Payer: Self-pay | Admitting: Pulmonary Disease

## 2019-05-20 ENCOUNTER — Other Ambulatory Visit: Payer: Self-pay

## 2019-05-20 DIAGNOSIS — R197 Diarrhea, unspecified: Secondary | ICD-10-CM

## 2019-05-20 DIAGNOSIS — R112 Nausea with vomiting, unspecified: Secondary | ICD-10-CM

## 2019-05-20 NOTE — Telephone Encounter (Signed)
Last vv 03/03/19 Last fill 06/03/18  #30/5

## 2019-05-21 MED ORDER — ONDANSETRON 4 MG PO TBDP: ORAL_TABLET | ORAL | 0 refills | Status: DC

## 2019-05-27 ENCOUNTER — Other Ambulatory Visit: Payer: Self-pay | Admitting: Cardiology

## 2019-05-27 DIAGNOSIS — E785 Hyperlipidemia, unspecified: Secondary | ICD-10-CM

## 2019-05-27 DIAGNOSIS — R002 Palpitations: Secondary | ICD-10-CM

## 2019-05-27 DIAGNOSIS — I5032 Chronic diastolic (congestive) heart failure: Secondary | ICD-10-CM

## 2019-05-27 DIAGNOSIS — I10 Essential (primary) hypertension: Secondary | ICD-10-CM

## 2019-05-28 ENCOUNTER — Other Ambulatory Visit: Payer: Self-pay

## 2019-05-28 ENCOUNTER — Telehealth: Payer: Self-pay | Admitting: General Practice

## 2019-05-28 MED ORDER — FREESTYLE LIBRE 14 DAY READER DEVI: 0 refills | Status: DC

## 2019-05-28 NOTE — Telephone Encounter (Signed)
Rx sent 

## 2019-05-28 NOTE — Telephone Encounter (Signed)
Message sent to Dr. C. 

## 2019-05-28 NOTE — Telephone Encounter (Signed)
Pt called and stated that her [facility] reader is malfunctioning.  Pt said she spoke with pharmacy and they told her that she would need a doctors approval for a new reader.  Please advise.

## 2019-05-28 NOTE — Telephone Encounter (Signed)
Patient is calling and requesting a Libre reader sent to [facility] on [address] CB is [phone]

## 2019-05-30 ENCOUNTER — Other Ambulatory Visit: Payer: Self-pay | Admitting: Pulmonary Disease

## 2019-06-09 ENCOUNTER — Encounter: Payer: Self-pay | Admitting: Family Medicine

## 2019-06-18 ENCOUNTER — Ambulatory Visit (INDEPENDENT_AMBULATORY_CARE_PROVIDER_SITE_OTHER): Payer: BC Managed Care – PPO

## 2019-06-18 ENCOUNTER — Other Ambulatory Visit: Payer: Self-pay

## 2019-06-18 ENCOUNTER — Ambulatory Visit (INDEPENDENT_AMBULATORY_CARE_PROVIDER_SITE_OTHER): Payer: BC Managed Care – PPO | Admitting: Pulmonary Disease

## 2019-06-18 ENCOUNTER — Encounter: Payer: Self-pay | Admitting: Pulmonary Disease

## 2019-06-18 DIAGNOSIS — J1282 Pneumonia due to coronavirus disease 2019: Secondary | ICD-10-CM

## 2019-06-18 DIAGNOSIS — J4551 Severe persistent asthma with (acute) exacerbation: Secondary | ICD-10-CM | POA: Diagnosis not present

## 2019-06-18 DIAGNOSIS — J471 Bronchiectasis with (acute) exacerbation: Secondary | ICD-10-CM

## 2019-06-18 DIAGNOSIS — U071 COVID-19: Secondary | ICD-10-CM

## 2019-06-18 MED ORDER — DOXYCYCLINE HYCLATE 100 MG PO TABS: 100.0000 mg | ORAL_TABLET | Freq: Two times a day (BID) | ORAL | 0 refills | Status: DC

## 2019-06-18 MED ORDER — PREDNISONE 10 MG PO TABS: ORAL_TABLET | ORAL | 0 refills | Status: DC

## 2019-06-18 NOTE — Progress Notes (Signed)
Virtual Visit via Telephone Note  I connected with Karen Barnes on 06/18/19 at 10:00 AM EDT by telephone and verified that I am speaking with the correct person using two identifiers.  Location: Patient: Home Provider: Office [facility] - [address] [place], [address], [place], [place] [address]   I discussed the limitations, risks, security and privacy concerns of performing an evaluation and management service by telephone and the availability of in person appointments. I also discussed with the patient that there may be a patient responsible charge related to this service. The patient expressed understanding and agreed to proceed.  Patient consented to consult via telephone: Yes People present and their role in pt care: Pt    History of Present Illness:  58 year old female never smoker, first seen in 2017 for asthma and a pulmonary nodule  She has severe GERD and is followed by Dr. Ewing Schlein.  She started on immunotherapy in 2017 She was born prematurely at 54 weeks.    Maintenance:  Symbicort 160, Spiriva Resp 1.25, Dupixent  Patient of Dr. Isaiah Serge  Chief complaint: Increased cough    59 year old female followed in our office for asthma and bronchiectasis.  She contacted our office reporting 1 to 2 weeks of increased cough, congestion, sinus pressure.  Patient is also having increased postnasal drip.  She started to have chills 2 days ago with more of a productive cough.  She is checked her temperature she is running a low-grade fever as of yesterday 99.1.  She reports adherence to Symbicort 160, Spiriva Respimat 1.25, Dupixent injections, hypertonic saline nebs twice daily.  Patient has had the increase her rescue inhaler use when at school.  She is also using albuterol nebulized meds prior to hypertonic saline.  Cough is typically dry but occasionally is productive.  She feels the sputum color is at baseline.  Patient feels that she is more short of breath, fatigue as well as  having increased wheezing.  Observations/Objective:  06/17/19 - temp - 99.1  Chest imaging November 07, 2017 high-resolution CT scan of the chest images : Mild cylindrical bronchiectasis particularly in the left lower lobe, some in the right lower lobe 10/18/2015 chest CT images  showing normal pulmonary parenchyma with the exception of a 3.4 mm nodule in the left lower lobe.  02/20/2019-chest x-ray-small focal of suspected pneumonia right middle lobe, lungs elsewhere clear, cardiac silhouette normal, no adenopathy   Pulmonary function test: October 2019 ratio 90%, FVC 2.56 L 76% predicted, total lung capacity 4.32 L 84% predicted, DLCO 20.7 mL 84% predicted  Labs: October 2019 complement levels within normal limits, IgG subclasses all normal, alpha-1 antitrypsin level normal, CBC showed 300 absolute eosinophil count  11/03/2015 - Ige - 32  Heart catheterization: October 2019 right heart catheterization showed mean PA pressure 24, pulmonary capillary wedge pressure 19, PVR 0.7 Woods units   Social History   Tobacco Use  Smoking Status Never Smoker  Smokeless Tobacco Never Used   Immunization History  Administered Date(s) Administered  . Influenza Inj Mdck Quad Pf 10/01/2018  . Influenza Split 03/12/2012, 11/12/2013, 11/07/2014  . Influenza Whole 12/08/2009  . Influenza,inj,Quad PF,6+ Mos 12/06/2012, 11/14/2017  . Influenza-Unspecified 11/06/2016, 10/01/2018  . PFIZER SARS-COV-2 Vaccination 05/12/2019, 06/03/2019  . Pneumococcal Conjugate-13 09/24/2015  . Pneumococcal Polysaccharide-23 09/18/2014  . Td 03/07/2007  . Tdap 03/30/2017  . Zoster Recombinat (Shingrix) 10/26/2018, 01/03/2019      Assessment and Plan:  Bronchiectasis with acute exacerbation [facility]) Assessment: October/2019 high-resolution CT  chest shows mild cylindrical bronchiectasis Patient reports clinical improvement since starting therapy vest Patient continues to take Mucinex daily Patient is managed  on hypertonic saline, Symbicort 160, Spiriva Respimat 1.25, albuterol nebulized medications, Mucinex  Plan: Doxycycline today Chest x-ray today Prednisone taper today Continue hypertonic saline Continue Symbicort 160 Continue therapy vest Continue Spiriva Respimat 1.25 Continue daily align if symptoms or not improving despite these interventions will need to obtain outpatient sputum sampling    Severe persistent asthma with exacerbation Plan: Doxycycline today Chest x-ray today Prednisone taper today Continue Symbicort 160 Continue Spiriva Respimat 1.25 Continue Dupixent injections Continue allergy injections We will coordinate 4-week follow-up in office to establish with Dr. [name]  Pneumonia due to COVID-19 virus History of COVID-19 No longer infectious  Plan: Chest x-ray today 4 to 6-week follow-up with our office   Follow Up Instructions:  Return in about 4 weeks (around 07/16/2019), or if symptoms worsen or fail to improve, for Follow up with Dr. [name].   I discussed the assessment and treatment plan with the patient. The patient was provided an opportunity to ask questions and all were answered. The patient agreed with the plan and demonstrated an understanding of the instructions.   The patient was advised to call back or seek an in-person evaluation if the symptoms worsen or if the condition fails to improve as anticipated.  I provided 24 minutes of non-face-to-face time during this encounter.   Lauraine Rinne, NP

## 2019-06-18 NOTE — Patient Instructions (Addendum)
You were seen today by [name], NP  for:   1. Severe persistent asthma with exacerbation 2. Bronchiectasis with acute exacerbation (HCC)  - DG Chest 2 View; Future - doxycycline (VIBRA-TABS) 100 MG tablet; Take 1 tablet (100 mg total) by mouth 2 (two) times daily.  Dispense: 20 tablet; Refill: 0 - predniSONE (DELTASONE) 10 MG tablet; 4 tabs for 2 days, then 3 tabs for 2 days, 2 tabs for 2 days, then 1 tab for 2 days, then stop  Dispense: 20 tablet; Refill: 0  Continue Symbicort >>> 2 puffs in the morning right when you wake up, rinse out your mouth after use, 12 hours later 2 puffs, rinse after use >>> Take this daily, no matter what >>> This is not a rescue inhaler   Spiriva Respimat 1.25 >>> 2 puffs daily >>> Do this every day >>>This is not a rescue inhaler  Continue hypertonic saline nebs twice daily  Continue albuterol nebs every 6 hours as needed for shortness of breath or wheezing  Bronchiectasis: This is the medical term which indicates that you have damage, dilated airways making you more susceptible to respiratory infection. Use a flutter valve 10 breaths twice a day or 4 to 5 breaths 4-5 times a day to help clear mucus out Let us know if you have cough with change in mucus color or fevers or chills.  At that point you would need an antibiotic. Maintain a healthy nutritious diet, eating whole foods Take your medications as prescribed   Continue therapy vest  If symptoms are not improving despite antibiotic use we will need to consider obtaining sputum cultures please contact our office if you feel like your symptoms are not improving  3. Pneumonia due to COVID-19 virus  History of COVID-19  We will get chest x-ray today   We recommend today:  Orders Placed This Encounter  Procedures  . DG Chest 2 View    Standing Status:   Future    Standing Expiration Date:   08/17/2020    Order Specific Question:   Reason for Exam (SYMPTOM  OR DIAGNOSIS REQUIRED)   Answer:   cough    Order Specific Question:   Preferred imaging location?    Answer:   Internal    Order Specific Question:   Radiology Contrast Protocol - do NOT remove file path    Answer:   [email or web address]   Orders Placed This Encounter  Procedures  . DG Chest 2 View   Meds ordered this encounter  Medications  . doxycycline (VIBRA-TABS) 100 MG tablet    Sig: Take 1 tablet (100 mg total) by mouth 2 (two) times daily.    Dispense:  20 tablet    Refill:  0  . predniSONE (DELTASONE) 10 MG tablet    Sig: 4 tabs for 2 days, then 3 tabs for 2 days, 2 tabs for 2 days, then 1 tab for 2 days, then stop    Dispense:  20 tablet    Refill:  0    Follow Up:    Return in about 4 weeks (around 07/16/2019), or if symptoms worsen or fail to improve, for Follow up with Dr. Isaiah Serge.   Please do your part to reduce the spread of COVID-19:      Reduce your risk of any infection  and COVID19 by using the similar precautions used for avoiding the common cold or flu:  Marland Kitchen Wash your hands often with soap and warm water for at least 20  seconds.  If soap and water are not readily available, use an alcohol-based hand sanitizer with at least 60% alcohol.  . If coughing or sneezing, cover your mouth and nose by coughing or sneezing into the elbow areas of your shirt or coat, into a tissue or into your sleeve (not your hands). Langley Gauss A MASK when in public  . Avoid shaking hands with others and consider head nods or verbal greetings only. . Avoid touching your eyes, nose, or mouth with unwashed hands.  . Avoid close contact with people who are sick. . Avoid places or events with large numbers of people in one location, like concerts or sporting events. . If you have some symptoms but not all symptoms, continue to monitor at home and seek medical attention if your symptoms worsen. . If you are having a medical emergency, call 911.   [place] / e-Visit: [email or web address]         [facility]: [facility]: [phone]                   [facility]: [phone]     It is flu season:   >>> Best ways to protect herself from the flu: Receive the yearly flu vaccine, practice good hand hygiene washing with soap and also using hand sanitizer when available, eat a nutritious meals, get adequate rest, hydrate appropriately   Please contact the office if your symptoms worsen or you have concerns that you are not improving.   Thank you for choosing [facility] for your healthcare, and for allowing [place] to partner with you on your healthcare journey. I am thankful to be able to provide care to you today.   Wyn Quaker FNP-C

## 2019-06-18 NOTE — Assessment & Plan Note (Signed)
Plan: Doxycycline today Chest x-ray today Prednisone taper today Continue Symbicort 160 Continue Spiriva Respimat 1.25 Continue Dupixent injections Continue allergy injections We will coordinate 4-week follow-up in office to establish with Dr. Isaiah Serge

## 2019-06-18 NOTE — Assessment & Plan Note (Signed)
History of COVID-19 No longer infectious  Plan: Chest x-ray today 4 to 6-week follow-up with our office

## 2019-06-18 NOTE — Assessment & Plan Note (Signed)
Assessment: October/2019 high-resolution CT chest shows mild cylindrical bronchiectasis Patient reports clinical improvement since starting therapy vest Patient continues to take Mucinex daily Patient is managed on hypertonic saline, Symbicort 160, Spiriva Respimat 1.25, albuterol nebulized medications, Mucinex  Plan: Doxycycline today Chest x-ray today Prednisone taper today Continue hypertonic saline Continue Symbicort 160 Continue therapy vest Continue Spiriva Respimat 1.25 Continue daily align if symptoms or not improving despite these interventions will need to obtain outpatient sputum sampling

## 2019-06-20 ENCOUNTER — Telehealth: Payer: Self-pay | Admitting: Pulmonary Disease

## 2019-06-20 NOTE — Telephone Encounter (Signed)
Advised pt of results. Pt understood and nothing further is needed.    [name], NP  06/19/2019 11:02 AM EDT    Your chest x-ray results of come back. Showing no acute changes. No plan of care changes at this time. Keep follow-up appointment.   Follow-up with our office if symptoms worsen or you do not feel like you are improving under her current regimen.  It was a pleasure taking care of you,  [name], FNP

## 2019-06-22 ENCOUNTER — Other Ambulatory Visit: Payer: Self-pay | Admitting: Primary Care

## 2019-06-24 ENCOUNTER — Other Ambulatory Visit: Payer: Self-pay | Admitting: Pulmonary Disease

## 2019-06-24 ENCOUNTER — Telehealth: Payer: Self-pay | Admitting: Pulmonary Disease

## 2019-06-24 DIAGNOSIS — J471 Bronchiectasis with (acute) exacerbation: Secondary | ICD-10-CM

## 2019-06-24 MED ORDER — LEVOFLOXACIN 500 MG PO TABS: 500.0000 mg | ORAL_TABLET | Freq: Every day | ORAL | 0 refills | Status: DC

## 2019-06-24 NOTE — Telephone Encounter (Signed)
Spoke with pt. She is aware of Brian's response. Orders have been placed for sputum samples. Rx has been sent in. Sample cups have been left at the front for the pt. Nothing further was needed.

## 2019-06-24 NOTE — Telephone Encounter (Signed)
Please contact the patient and let her know that she needs to produce sputum samples for [place].  Please order sputum cultures: Respiratory sputum, fungal, AFB  Patient will need to pick up cups from the lab and present these to the lab within 3 hours of producing the sputum sample.  Please explained to patient and coordinate.  Okay to call in: Levaquin 500mg  tablet >>> Take 1 500 mg tablet daily for the next 7 days >>> Take with food >>> start probiotic for good gut health >>>avoid rigorous exercise for next 2 weeks   Most recent chest x-ray clear.  Given the patient's symptoms and history of flares we will treat with Levaquin.  We need to obtain sputum cultures to better understand exactly what are we in fact treating.  Patient needs to keep follow-up in June/2021 with Dr. July/2021.  Will route to Dr. Isaiah Serge as Isaiah Serge.   Lorain Childes, FNP

## 2019-06-24 NOTE — Telephone Encounter (Signed)
Spoke with pt. States that she was seen by Arlys John on 06/18/19 for a bronchiectasis flare. Pt was given Doxycyline and prednisone. Reports that she feels like she is regressing, she finished the pred taper but has a few more days of Doxy. She is now coughing more and the cough is producing thick, yellow mucus. Denies fever/chills, congestion/runny nose, sore throat, severe headache, joint pain, unexplained muscle aches, increased shortness of breath, loss of taste or smell, rash, N/V/D, abdominal pain, redness around/in the eye, increased weakness or unexplained bruising or bleeding.  Pt is requesting to have Levaquin sent in, as this is really the only antibiotic that works for her. Doxycyline has been added to her "allergy" list per her request since Doxy "does nothing for her."  Arlys John - please advise. Thanks.

## 2019-06-25 ENCOUNTER — Telehealth: Payer: Self-pay | Admitting: Pulmonary Disease

## 2019-06-25 ENCOUNTER — Other Ambulatory Visit: Payer: BC Managed Care – PPO

## 2019-06-25 DIAGNOSIS — J471 Bronchiectasis with (acute) exacerbation: Secondary | ICD-10-CM

## 2019-06-25 NOTE — Telephone Encounter (Signed)
Called and spoke with pt letting her know the info stated by Maralyn Sago and she verbalized understanding. Pt stated she is going to hold off on going to the ED. Nothing further needed.

## 2019-06-25 NOTE — Telephone Encounter (Signed)
Pt. Just had a pred taper this week . We have no OV until 5/25 I am not comfortable doing another pred taper without physical exam. Please have her go to urgent care or ED fast track  to be evaluated.  Consider switching biologic

## 2019-06-25 NOTE — Telephone Encounter (Signed)
Called and spoke with pt who stated she feels like she needs another round of prednisone as she stated that "it feels like there is an elephant on chest." pt stated she has had to use her rescue inhaler at least 6-7 times today. Pt left early from work today.  Pt stated she did take first pill of levaquin today 5/18 around 5:30am.  Asked pt if she was wheezing and she stated that she is not but stated she has a lot of tightness and pressure and states she does have some pain probably due to all the albuterol she has had to use.  Pt stated that her husband took a sputum sample to [place] office today 5/19.   Pt stated she is scheduled to do field day tomorrow at work and states she really needs another round of prednisone to help her make it through that. Sarah, please advise.

## 2019-07-11 ENCOUNTER — Other Ambulatory Visit: Payer: Self-pay

## 2019-07-11 ENCOUNTER — Telehealth: Payer: Self-pay | Admitting: Family Medicine

## 2019-07-11 MED ORDER — METFORMIN HCL 1000 MG PO TABS: 1000.0000 mg | ORAL_TABLET | Freq: Two times a day (BID) | ORAL | 0 refills | Status: DC

## 2019-07-11 NOTE — Progress Notes (Signed)
Left detailed vm for patient with sputum results.  Advised to call the office with any questions/concerns.

## 2019-07-11 NOTE — Telephone Encounter (Signed)
Rx sent 

## 2019-07-11 NOTE — Telephone Encounter (Signed)
Patient needs refill of metformin sent to [facility]. She is scheduled for TOC to Dr. Barron Alvine on 09/05/19.

## 2019-07-14 DIAGNOSIS — M8580 Other specified disorders of bone density and structure, unspecified site: Secondary | ICD-10-CM | POA: Insufficient documentation

## 2019-07-15 ENCOUNTER — Other Ambulatory Visit: Payer: BC Managed Care – PPO

## 2019-07-21 ENCOUNTER — Ambulatory Visit: Payer: BC Managed Care – PPO | Admitting: Internal Medicine

## 2019-08-05 ENCOUNTER — Ambulatory Visit: Payer: BC Managed Care – PPO | Admitting: Pulmonary Disease

## 2019-08-05 ENCOUNTER — Encounter: Payer: Self-pay | Admitting: Pulmonary Disease

## 2019-08-05 ENCOUNTER — Other Ambulatory Visit: Payer: Self-pay

## 2019-08-05 VITALS — BP 134/80 | HR 82 | Temp 98.3°F | Ht 64.0 in | Wt 186.8 lb

## 2019-08-05 DIAGNOSIS — J479 Bronchiectasis, uncomplicated: Secondary | ICD-10-CM

## 2019-08-05 DIAGNOSIS — J4551 Severe persistent asthma with (acute) exacerbation: Secondary | ICD-10-CM

## 2019-08-05 NOTE — Patient Instructions (Signed)
I am glad you are doing well with regard to your breathing Continue inhalers as prescribed and Dupixent injection Check with Dr. Madie Reno about resuming allergy shots You can use Zyrtec over-the-counter for postnasal drip in addition to Flonase.  Follow-up in 6 months

## 2019-08-05 NOTE — Progress Notes (Signed)
Karen Barnes    [id number]    [birth year]  Primary Care Physician:Cirigliano, Jearld Lesch, DO  Referring Physician: Dianne Dun, MD No address on file  Chief complaint:   Follow-up for asthma, bronchiectasis  HPI: 59 year old with history of asthma, bronchiectasis immunotherapy with Dupixent started in 2019 Maintained on Symbicort, Spiriva. She has severe GERD and is followed by Dr. Ewing Schlein.  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  She is doing person classes at present.  She gets regular allergy shots with Dr. Madie Reno  Interim history: Developed COVID-19 in December 2020.  She was treated with monoclonal antibody as an outpatient and did not require hospitalization.  Overall she is recovered well with chest x-ray last month showing clear lungs  Continues with Dupixent therapy and inhalers, Singulair Chief complaint today is postnasal drip.  She is on Flonase and Allegra Allergy shots have been interrupted and she is planning to resume them soon  Outpatient Encounter Medications as of 08/05/2019  Medication Sig  . albuterol (PROVENTIL HFA;VENTOLIN HFA) 108 (90 BASE) MCG/ACT inhaler Inhale 2 puffs into the lungs every 6 (six) hours as needed.  Marland Kitchen aspirin EC 81 MG tablet Take 81 mg by mouth daily.  . budesonide-formoterol (SYMBICORT) 160-4.5 MCG/ACT inhaler Inhale 2 puffs into the lungs 2 (two) times daily.  . Calcium Carbonate-Vitamin D (CALCIUM 600+D) 600-200 MG-UNIT TABS Take 1 tablet by mouth daily.  . Continuous Blood Gluc Receiver (FREESTYLE LIBRE 14 DAY READER) DEVI USE AS DIRECTED  . Continuous Blood Gluc Sensor (FREESTYLE LIBRE 14 DAY SENSOR) MISC USE AS DIRECTED PER MD  . DUPIXENT 300 MG/2ML prefilled syringe INJECT 300 MG SUBCUTANEOUSLY EVERY OTHER WEEK  . EPINEPHrine (AUVI-Q) 0.3 mg/0.3 mL IJ SOAJ injection Inject 0.3 mg into the muscle as directed. For immunotherapy (allergy shots) anaphylactic reactions  . Estradiol 10  MCG TABS vaginal tablet Place vaginally.  . fexofenadine (ALLEGRA) 180 MG tablet Take 180 mg by mouth every morning.   . fluticasone (FLONASE) 50 MCG/ACT nasal spray Place 2 sprays into both nostrils 2 (two) times daily.   . furosemide (LASIX) 20 MG tablet TAKE 1 TABLET(20 MG) BY MOUTH DAILY AS NEEDED FOR FLUID RETENTION OR SWELLING  . hydrocortisone 2.5 % cream APPLY EXTERNALLY TO THE AFFECTED AREA TWICE DAILY AS NEEDED (Patient taking differently: Apply 1 application topically 2 (two) times daily as needed (for skin irritation/rash). )  . metFORMIN (GLUCOPHAGE) 1000 MG tablet Take 1 tablet (1,000 mg total) by mouth 2 (two) times daily.  . montelukast (SINGULAIR) 10 MG tablet Take 10 mg by mouth at bedtime.    . Multiple Vitamins-Minerals (MULTIVITAMIN WITH MINERALS) tablet Take 1 tablet by mouth daily.  Marland Kitchen omeprazole (PRILOSEC) 40 MG capsule Take 40 mg by mouth at bedtime.  . pantoprazole (PROTONIX) 40 MG tablet TAKE 1 TABLET(40 MG) BY MOUTH DAILY  . rosuvastatin (CRESTOR) 5 MG tablet TAKE 1 TABLET(5 MG) BY MOUTH DAILY  . sodium chloride HYPERTONIC 3 % nebulizer solution Take by nebulization as needed for other. Inhale contents of 1 vial via nebulizer twice daily as directed  . SPIRIVA RESPIMAT 1.25 MCG/ACT AERS INHALE 2 PUFFS BY MOUTH EVERY DAY  . bifidobacterium infantis (ALIGN) capsule Take 1 capsule by mouth daily.  Marland Kitchen PRESCRIPTION MEDICATION Allergy Shots (Patient not taking: Reported on 08/05/2019)  . [DISCONTINUED] doxycycline (VIBRA-TABS) 100 MG tablet Take 1 tablet (100 mg total) by mouth 2 (two) times  daily.  . [DISCONTINUED] levofloxacin (LEVAQUIN) 500 MG tablet Take 1 tablet (500 mg total) by mouth daily.  . [DISCONTINUED] nitrofurantoin, macrocrystal-monohydrate, (MACROBID) 100 MG capsule TAKE AS DIRECTED POST COITAL  . [DISCONTINUED] ondansetron (ZOFRAN-ODT) 4 MG disintegrating tablet DISSOLVE 1 TABLET BY MOUTH EVERY 8 HOURS AS NEEDED FOR NAUSEA OR VOMITING  . [DISCONTINUED]  predniSONE (DELTASONE) 10 MG tablet 4 tabs for 2 days, then 3 tabs for 2 days, 2 tabs for 2 days, then 1 tab for 2 days, then stop   No facility-administered encounter medications on file as of 08/05/2019.   Physical Exam: Blood pressure 122/80, pulse 70, temperature (!) 97 F (36.1 C), temperature source Temporal, height 5\' 4"  (1.626 m), weight 183 lb (83 kg), SpO2 99 %. Gen:      No acute distress HEENT:  EOMI, sclera anicteric Neck:     No masses; no thyromegaly Lungs:    Clear to auscultation bilaterally; normal respiratory effort CV:         Regular rate and rhythm; no murmurs Abd:      + bowel sounds; soft, non-tender; no palpable masses, no distension Ext:    No edema; adequate peripheral perfusion Skin:      Warm and dry; no rash Neuro: alert and oriented x 3 Psych: normal mood and affect  Data Reviewed: Imaging: November 07, 2017 high-resolution CT scan of the chest images : Mild cylindrical bronchiectasis particularly in the left lower lobe, some in the right lower lobe.  10/18/2015 chest CT images showing normal pulmonary parenchyma with the exception of a 3.4 mm nodule in the left lower lobe.  Chest x-ray 06/10/2019-clear lungs  Pulmonary function test: October 2019 ratio 90%, FVC 2.56 L 76% predicted, total lung capacity 4.32 L 84% predicted, DLCO 20.7 mL 84% predicted  Labs: October 2019 complement levels within normal limits, IgG subclasses all normal, alpha-1 antitrypsin level normal, CBC showed 300 absolute eosinophil count  11/03/2015 - Ige - 32  Heart catheterization: October 2019 right heart catheterization showed mean PA pressure 24, pulmonary capillary wedge pressure 19, PVR 0.7 Woods units  Assessment:  Bronchiectasis Continue chest vest, Mucinex.  Hypertonic saline for ciliary clearance  Severe persistent asthma Continue Symbicort, Spiriva, Dupixent She can use over-the-counter Zyrtec for postnasal drip She will check with Dr. November 2019 about  resuming her allergy shots  Plan/Recommendations: Follow-up in 6 months  Madie Reno MD [facility] 08/05/2019, 9:22 AM  CC: 08/07/2019, MD

## 2019-08-09 LAB — FUNGUS CULTURE W SMEAR
CULTURE:: NO GROWTH
MICRO NUMBER:: 10496033
SMEAR:: NONE SEEN
SPECIMEN QUALITY:: ADEQUATE

## 2019-08-09 LAB — RESPIRATORY CULTURE OR RESPIRATORY AND SPUTUM CULTURE
MICRO NUMBER:: 10496035
RESULT:: NORMAL
SPECIMEN QUALITY:: ADEQUATE

## 2019-08-09 LAB — MYCOBACTERIA,CULT W/FLUOROCHROME SMEAR
MICRO NUMBER:: 10496034
SMEAR:: NONE SEEN
SPECIMEN QUALITY:: ADEQUATE

## 2019-09-02 ENCOUNTER — Ambulatory Visit
Admission: RE | Admit: 2019-09-02 | Discharge: 2019-09-02 | Disposition: A | Discharge status: Home / Self Care | Payer: BC Managed Care – PPO | Source: Ambulatory Visit | Attending: Internal Medicine | Admitting: Internal Medicine

## 2019-09-02 DIAGNOSIS — E041 Nontoxic single thyroid nodule: Secondary | ICD-10-CM

## 2019-09-04 ENCOUNTER — Encounter: Payer: BC Managed Care – PPO | Admitting: Family Medicine

## 2019-09-04 ENCOUNTER — Other Ambulatory Visit: Payer: Self-pay

## 2019-09-05 ENCOUNTER — Encounter: Payer: Self-pay | Admitting: Family Medicine

## 2019-09-05 ENCOUNTER — Ambulatory Visit: Payer: BC Managed Care – PPO | Admitting: Family Medicine

## 2019-09-05 VITALS — BP 122/90 | HR 78 | Temp 97.8°F | Ht 64.0 in | Wt 188.2 lb

## 2019-09-05 DIAGNOSIS — E118 Type 2 diabetes mellitus with unspecified complications: Secondary | ICD-10-CM | POA: Diagnosis not present

## 2019-09-05 DIAGNOSIS — E1169 Type 2 diabetes mellitus with other specified complication: Secondary | ICD-10-CM | POA: Diagnosis not present

## 2019-09-05 DIAGNOSIS — R030 Elevated blood-pressure reading, without diagnosis of hypertension: Secondary | ICD-10-CM

## 2019-09-05 DIAGNOSIS — E785 Hyperlipidemia, unspecified: Secondary | ICD-10-CM

## 2019-09-05 DIAGNOSIS — H6591 Unspecified nonsuppurative otitis media, right ear: Secondary | ICD-10-CM

## 2019-09-05 LAB — MICROALBUMIN / CREATININE URINE RATIO
Creatinine,U: 8.1 mg/dL
Microalb Creat Ratio: 8.6 mg/g (ref 0.0–30.0)
Microalb, Ur: <0.7 mg/dL (ref 0.0–1.9)

## 2019-09-05 LAB — HEMOGLOBIN A1C: Hgb A1c MFr Bld: 7.6 % — ABNORMAL HIGH (ref 4.6–6.5)

## 2019-09-05 NOTE — Progress Notes (Signed)
Chief Complaint  Patient presents with  . Establish Care    Pt here for 6 month follow up. Pt c/o rt ear irratation     HPI: *Karen Barnes is a 59 y.o. female here for [facility] appt, previous PCP Dr. Dayton Martes, and 72mo DM, hyperlipidemia, elevated BP f/u follow-up. For her DM, pt takes metformin 1000mg  BID. She is also on crestor 5mg  daily and ASA 81mg  daily. No ACE or ARB.  She has Libre CBG monitor. Average BS is 151 Hypoglycemia/Hypergylcemic episodes: no  In 06/2019 pt was on prednisone and 2 abx. She also injured her knee in 06/2019 and she has only been able to resume her regular walking at [facility] 5 days/wk x 07/2019 (3-4 miles) in the past couple of weeks    Lab Results  Component Value Date   HGBA1C 8.1 (H) 03/04/2019   Lab Results  Component Value Date   MICROALBUR <0.7 09/02/2018   Lab Results  Component Value Date   CREATININE 0.65 03/04/2019   Lab Results  Component Value Date   CHOL 155 03/04/2019   HDL 68.40 03/04/2019   LDLCALC 71 03/04/2019   TRIG 76.0 03/04/2019   CHOLHDL 2 03/04/2019    The 10-year ASCVD risk score 03/06/2019 DC Jr., et al., 2013) is: 5.5%   Values used to calculate the score:     Age: 34 years     Sex: Female     Is Non-Hispanic African American: No     Diabetic: Yes     Tobacco smoker: No     Systolic Blood Pressure: 122 mmHg     Is BP treated: Yes     HDL Cholesterol: 68.4 mg/dL     Total Cholesterol: 155 mg/dL  She also complains of Rt ear irritation x 1 mo or so. She used debrox gtts. She does not believe she has any hearing change. No pain.   Past Medical History:  Diagnosis Date  . Asthma, moderate persistent    sees Dr Denman George  . Atrial tachycardia (HCC)   . Chronic diastolic CHF (congestive heart failure) (HCC)   . COVID-19 virus infection 01/2019  . Diabetes mellitus    A1C 6.2---->  2009  . Family history of adverse reaction to anesthesia    sister had cardiac arrest with rhinoplasty  . Fibrocystic breast   . GERD  (gastroesophageal reflux disease)   . HH (hiatus hernia) 09/2014   Dr. 02/2019  . History of echocardiogram    Echo 5/19: EF 55-60, normal wall motion, PASP 41  . History of hiatal hernia   . History of nuclear stress test    Nuclear stress test 5/19:  EF 64, normal perfusion, Low risk  . Hot flashes   . Hyperlipidemia   . Premature atrial contractions   . Pulmonary hypertension (HCC)   . PVC's (premature ventricular contractions)   . Recurrent cystitis    took  postcoital antibiotic for years, symptoms better after her hysterectomy 1996, symptoms re-surface in 2011  . RUQ abdominal pain 2016   EGD showed small HH, otherwise normal  . Wears glasses     Past Surgical History:  Procedure Laterality Date  . ABDOMINAL HYSTERECTOMY  1996   no oophorectomy, d/tendometriosis and fibroids approximately 1996  . BREAST EXCISIONAL BIOPSY Right 03/12/2014   [facility]  . BREAST LUMPECTOMY WITH RADIOACTIVE SEED LOCALIZATION Right 03/12/2014   Procedure: BREAST LUMPECTOMY WITH RADIOACTIVE SEED LOCALIZATION;  Surgeon: 1997, MD;  Location: [facility];  Service: General;  Laterality: Right;  . BREAST SURGERY     Lumpectomy-- BENIGN   . CATARACT EXTRACTION Bilateral 2011  . CESAREAN SECTION    . COSMETIC SURGERY  01/2017   MonaLisa Touch w/ GYN  . DILATION AND CURETTAGE OF UTERUS    . LIPOMA EXCISION  1978  . RIGHT HEART CATH N/A 11/26/2017   Procedure: RIGHT HEART CATH;  Surgeon: Laurey Morale, MD;  Location: [facility] INVASIVE CV LAB;  Service: Cardiovascular;  Laterality: N/A;  . UPPER GASTROINTESTINAL ENDOSCOPY    . UPPER GI ENDOSCOPY  09/11/2014   Small Hiatus Hernia, Dr. Ewing Schlein    Social History   Socioeconomic History  . Marital status: Married    Spouse name: Not on file  . Number of children: 1  . Years of education: College  . Highest education level: Not on file  Occupational History  . Occupation: retired principal  Tobacco Use  . Smoking status: Never Smoker  .  Smokeless tobacco: Never Used  Vaping Use  . Vaping Use: Never used  Substance and Sexual Activity  . Alcohol use: No  . Drug use: No  . Sexual activity: Yes  Other Topics Concern  . Not on file  Social History Narrative   Household-- pt and husband    Rare caffeine use   Daughter independent, 1 G-child   Engineer, structural for F and M, both w/ dementia   Social Determinants of Health   Financial Resource Strain:   . Difficulty of Paying Living Expenses:   Food Insecurity:   . Worried About Programme researcher, broadcasting/film/video in the Last Year:   . Barista in the Last Year:   Transportation Needs:   . Freight forwarder (Medical):   Marland Kitchen Lack of Transportation (Non-Medical):   Physical Activity:   . Days of Exercise per Week:   . Minutes of Exercise per Session:   Stress:   . Feeling of Stress :   Social Connections:   . Frequency of Communication with Friends and Family:   . Frequency of Social Gatherings with Friends and Family:   . Attends Religious Services:   . Active Member of Clubs or Organizations:   . Attends Banker Meetings:   Marland Kitchen Marital Status:   Intimate Partner Violence:   . Fear of Current or Ex-Partner:   . Emotionally Abused:   Marland Kitchen Physically Abused:   . Sexually Abused:     Family History  Problem Relation Age of Onset  . Mitral valve prolapse Mother   . Breast cancer Mother 62       M had ca insitu (age 77), aunt   . Dementia Mother   . Heart disease Father 8       early   . Diabetes Father   . Kidney failure Father   . Sleep apnea Father   . Brain cancer Other        2 fam members   . Lung cancer Other        uncle, heavy smoker  . Breast cancer Maternal Aunt 60  . CAD Paternal Grandmother 71  . CAD Paternal Grandfather 59  . Colon cancer Neg Hx      Immunization History  Administered Date(s) Administered  . Influenza Inj Mdck Quad Pf 10/01/2018  . Influenza Split 03/12/2012, 11/12/2013, 11/07/2014  . Influenza Whole 12/08/2009  .  Influenza,inj,Quad PF,6+ Mos 12/06/2012, 11/14/2017  . Influenza-Unspecified 11/06/2016, 10/01/2018  . PFIZER  SARS-COV-2 Vaccination 05/12/2019, 06/03/2019  . Pneumococcal Conjugate-13 09/24/2015  . Pneumococcal Polysaccharide-23 09/18/2014  . Td 03/07/2007  . Tdap 03/30/2017  . Zoster Recombinat (Shingrix) 10/26/2018, 01/03/2019    Outpatient Encounter Medications as of 09/05/2019  Medication Sig Note  . albuterol (PROVENTIL HFA;VENTOLIN HFA) 108 (90 BASE) MCG/ACT inhaler Inhale 2 puffs into the lungs every 6 (six) hours as needed. 08/20/2018: AS NEEDED  . aspirin EC 81 MG tablet Take 81 mg by mouth daily.   . bifidobacterium infantis (ALIGN) capsule Take 1 capsule by mouth daily.   . budesonide-formoterol (SYMBICORT) 160-4.5 MCG/ACT inhaler Inhale 2 puffs into the lungs 2 (two) times daily.   . Calcium Carbonate-Vitamin D (CALCIUM 600+D) 600-200 MG-UNIT TABS Take 1 tablet by mouth daily.   . Continuous Blood Gluc Receiver (FREESTYLE LIBRE 14 DAY READER) DEVI USE AS DIRECTED   . Continuous Blood Gluc Sensor (FREESTYLE LIBRE 14 DAY SENSOR) MISC USE AS DIRECTED PER MD   . DUPIXENT 300 MG/2ML prefilled syringe INJECT 300 MG SUBCUTANEOUSLY EVERY OTHER WEEK   . EPINEPHrine (AUVI-Q) 0.3 mg/0.3 mL IJ SOAJ injection Inject 0.3 mg into the muscle as directed. For immunotherapy (allergy shots) anaphylactic reactions 08/20/2018: AS NEEDED ONLY   . Estradiol 10 MCG TABS vaginal tablet Place vaginally.   . fluticasone (FLONASE) 50 MCG/ACT nasal spray Place 2 sprays into both nostrils 2 (two) times daily.    . furosemide (LASIX) 20 MG tablet TAKE 1 TABLET(20 MG) BY MOUTH DAILY AS NEEDED FOR FLUID RETENTION OR SWELLING   . hydrocortisone 2.5 % cream APPLY EXTERNALLY TO THE AFFECTED AREA TWICE DAILY AS NEEDED (Patient taking differently: Apply 1 application topically 2 (two) times daily as needed (for skin irritation/rash). ) 08/20/2018: AS NEEDED  . metFORMIN (GLUCOPHAGE) 1000 MG tablet Take 1 tablet (1,000  mg total) by mouth 2 (two) times daily.   . montelukast (SINGULAIR) 10 MG tablet Take 10 mg by mouth at bedtime.     . Multiple Vitamins-Minerals (MULTIVITAMIN WITH MINERALS) tablet Take 1 tablet by mouth daily.   Marland Kitchen. omeprazole (PRILOSEC) 40 MG capsule Take 40 mg by mouth at bedtime.   . pantoprazole (PROTONIX) 40 MG tablet TAKE 1 TABLET(40 MG) BY MOUTH DAILY   . PRESCRIPTION MEDICATION Allergy Shots  09/23/2017: Dr. administered  . rosuvastatin (CRESTOR) 5 MG tablet TAKE 1 TABLET(5 MG) BY MOUTH DAILY   . sodium chloride HYPERTONIC 3 % nebulizer solution Take by nebulization as needed for other. Inhale contents of 1 vial via nebulizer twice daily as directed   . SPIRIVA RESPIMAT 1.25 MCG/ACT AERS INHALE 2 PUFFS BY MOUTH EVERY DAY   . fexofenadine (ALLEGRA) 180 MG tablet Take 180 mg by mouth every morning.  (Patient not taking: Reported on 09/05/2019)    No facility-administered encounter medications on file as of 09/05/2019.     ROS: Gen: no fever, chills  ENT: Rt ear fullness Eyes: no blurry vision, double vision Resp: no cough, wheeze,SOB CV: no CP, palpitations, LE edema,  GI: no heartburn, n/v/d/c, abd pain MSK: no joint pain, myalgias, back pain Neuro: no dizziness, headache, weakness   Allergies  Allergen Reactions  . Butorphanol Tartrate Other (See Comments)    REACTION: hallucinations  . Hydrocodone Other (See Comments)    HALLUCINATIONS  . Roxicet [Oxycodone-Acetaminophen] Other (See Comments)    HALLUCINATIONS  . Doxycycline     DOES NOT WORK FOR PATIENT  . Penicillins Hives    Occurred at age 51/CHILDHOOD ALLERGY Has patient had a PCN reaction causing  immediate rash, facial/tongue/throat swelling, SOB or lightheadedness with hypotension: Unknown Has patient had a PCN reaction causing severe rash involving mucus membranes or skin necrosis: Unknown Has patient had a PCN reaction that required hospitalization: Unknown Has patient had a PCN reaction occurring within the  last 10 years: No If all of the above answers are "NO", then may proceed with Cephalosporin use.     BP (!) 122/90 (BP Location: Left Arm, Patient Position: Sitting, Cuff Size: Normal)   Pulse 78   Temp 97.8 F (36.6 C) (Temporal)   Ht 5\' 4"  (1.626 m)   Wt 188 lb 3.2 oz (85.4 kg)   SpO2 98%   BMI 32.30 kg/m   BP Readings from Last 3 Encounters:  09/05/19 (!) 122/90  08/05/19 134/80  03/13/19 132/86     Physical Exam Constitutional:      General: She is not in acute distress.    Appearance: Normal appearance. She is not toxic-appearing.  HENT:     Right Ear: Hearing, ear canal and external ear normal. No tenderness. A middle ear effusion is present. Tympanic membrane is not erythematous.  Cardiovascular:     Rate and Rhythm: Normal rate and regular rhythm.     Pulses: Normal pulses.     Heart sounds: Normal heart sounds.  Pulmonary:     Effort: Pulmonary effort is normal. No respiratory distress.     Breath sounds: Normal breath sounds.  Musculoskeletal:     Right lower leg: No edema.     Left lower leg: No edema.  Neurological:     Mental Status: She is alert and oriented to person, place, and time.  Psychiatric:        Mood and Affect: Mood normal.        Behavior: Behavior normal.      A/P:  1. Type 2 diabetes mellitus with complication (HCC) - course of prednisone and 2 abx in 06/2019. No regular exercise x 2 mo d/t knee injury - pt is taking metformin 1000mg  BID - average BS 151 - Hemoglobin A1c - Microalbumin / creatinine urine ratio - f/u in 54mo or sooner PRN  2. Elevated blood pressure reading - elevated DBP reading, not on med - pt just recently back to walking 5 days/wk after 2 mo break d/t knee injury - f/u in 3 mo  3. Hyperlipidemia associated with type 2 diabetes mellitus (HCC) - last FLP in 02/2019 at goal - cont statin  4. Middle ear effusion, right - likely d/t chronic allergies - pt is taking zyrtec and using flonase, will try 3 doses of  sudafed

## 2019-09-12 ENCOUNTER — Ambulatory Visit: Payer: BC Managed Care – PPO | Admitting: Internal Medicine

## 2019-09-12 ENCOUNTER — Other Ambulatory Visit: Payer: Self-pay

## 2019-09-12 ENCOUNTER — Encounter: Payer: Self-pay | Admitting: Internal Medicine

## 2019-09-12 VITALS — BP 132/80 | HR 98 | Ht 64.0 in | Wt 186.8 lb

## 2019-09-12 DIAGNOSIS — E042 Nontoxic multinodular goiter: Secondary | ICD-10-CM | POA: Insufficient documentation

## 2019-09-12 LAB — TSH: TSH: 1.65 u[IU]/mL (ref 0.35–4.50)

## 2019-09-12 LAB — T4, FREE: Free T4: 1.05 ng/dL (ref 0.60–1.60)

## 2019-09-12 NOTE — Progress Notes (Signed)
Name: Karen CottonBeverly B Karger  MRN/ DOB: [id number], 26-Jun-1960    Age/ Sex: 59 y.o., female     PCP: Overton Mamirigliano, Mary K, DO   Reason for Endocrinology Evaluation: Thyroid nodule      Initial Endocrinology Clinic Visit: 12/28/2017    PATIENT IDENTIFIER: Ms. Karen Barnes is a 59 y.o., female with a past medical history of T2DM, Asthma, GERD,Thyroid nodule and bronchiectasis. She has followed with [facility] since 12/28/2017 for consultative assistance with management of her thyroid nodule.   HISTORICAL SUMMARY: The patient was first diagnosed with right thyroid nodule in 11/2017 during CT scan of the chest at was performed for evaluation of cough.  S/P FNA with benign cytology in October, 2019  Per patient she has had intermittently low TSH.   SUBJECTIVE:   Today (09/12/2019):  Ms. Karen Barnes is here for a follow up on right thyroid nodule.    Has chronic  cough due to bronchiectasis.  Uses therapy vest twice a day.  Had COVID 01/2019 received monoclonal antibody infusions   Has noted slight left sided neck swelling. Has occasional sensitivity  Has occasional mild sore throat    Has occasional palpitations- has PAC's not on any meds  Occasional diarrhea - that she attributes to possibly metformin  Denies tremors      ROS:  As per HPI.   HISTORY:  Past Medical History:  Past Medical History:  Diagnosis Date  . Asthma, moderate persistent    sees Dr Irena CordsVan Winkle  . Atrial tachycardia (HCC)   . Chronic diastolic CHF (congestive heart failure) (HCC)   . COVID-19 virus infection 01/2019  . Diabetes mellitus    A1C 6.2---->  2009  . Family history of adverse reaction to anesthesia    sister had cardiac arrest with rhinoplasty  . Fibrocystic breast   . GERD (gastroesophageal reflux disease)   . HH (hiatus hernia) 09/2014   Dr. Ewing SchleinMagod  . History of echocardiogram    Echo 5/19: EF 55-60, normal wall motion, PASP 41  . History of hiatal hernia   . History  of nuclear stress test    Nuclear stress test 5/19:  EF 64, normal perfusion, Low risk  . Hot flashes   . Hyperlipidemia   . Premature atrial contractions   . Pulmonary hypertension (HCC)   . PVC's (premature ventricular contractions)   . Recurrent cystitis    took  postcoital antibiotic for years, symptoms better after her hysterectomy 1996, symptoms re-surface in 2011  . RUQ abdominal pain 2016   EGD showed small HH, otherwise normal  . Wears glasses    Past Surgical History:  Past Surgical History:  Procedure Laterality Date  . ABDOMINAL HYSTERECTOMY  1996   no oophorectomy, d/tendometriosis and fibroids approximately 1996  . BREAST EXCISIONAL BIOPSY Right 03/12/2014   [facility]  . BREAST LUMPECTOMY WITH RADIOACTIVE SEED LOCALIZATION Right 03/12/2014   Procedure: BREAST LUMPECTOMY WITH RADIOACTIVE SEED LOCALIZATION;  Surgeon: Harriette Bouillonhomas Cornett, MD;  Location: [facility];  Service: General;  Laterality: Right;  . BREAST SURGERY     Lumpectomy-- BENIGN   . CATARACT EXTRACTION Bilateral 2011  . CESAREAN SECTION    . COSMETIC SURGERY  01/2017   MonaLisa Touch w/ GYN  . DILATION AND CURETTAGE OF UTERUS    . LIPOMA EXCISION  1978  . RIGHT HEART CATH N/A 11/26/2017   Procedure: RIGHT HEART CATH;  Surgeon: Laurey MoraleMcLean, Dalton S, MD;  [facility];  Service: Cardiovascular;  Laterality: N/A;  . UPPER GASTROINTESTINAL ENDOSCOPY    . UPPER GI ENDOSCOPY  09/11/2014   Small Hiatus Hernia, Dr. Ewing Schlein    Social History:  reports that she has never smoked. She has never used smokeless tobacco. She reports that she does not drink alcohol and does not use drugs. Family History:  Family History  Problem Relation Age of Onset  . Mitral valve prolapse Mother   . Breast cancer Mother 33       M had ca insitu (age 21), aunt   . Dementia Mother   . Heart disease Father 31       early   . Diabetes Father   . Kidney failure Father   . Sleep apnea Father   . Brain cancer Other          2 fam members   . Lung cancer Other        uncle, heavy smoker  . Breast cancer Maternal Aunt 60  . CAD Paternal Grandmother 59  . CAD Paternal Grandfather 37  . Colon cancer Neg Hx      HOME MEDICATIONS: Allergies as of 09/12/2019      Reactions   Butorphanol Tartrate Other (See Comments)   REACTION: hallucinations   Hydrocodone Other (See Comments)   HALLUCINATIONS   Roxicet [oxycodone-acetaminophen] Other (See Comments)   HALLUCINATIONS   Doxycycline    DOES NOT WORK FOR PATIENT   Penicillins Hives   Occurred at age 37/CHILDHOOD ALLERGY Has patient had a PCN reaction causing immediate rash, facial/tongue/throat swelling, SOB or lightheadedness with hypotension: Unknown Has patient had a PCN reaction causing severe rash involving mucus membranes or skin necrosis: Unknown Has patient had a PCN reaction that required hospitalization: Unknown Has patient had a PCN reaction occurring within the last 10 years: No If all of the above answers are "NO", then may proceed with Cephalosporin use.      Medication List       Accurate as of September 12, 2019 12:28 PM. If you have any questions, ask your nurse or doctor.        albuterol 108 (90 Base) MCG/ACT inhaler Commonly known as: VENTOLIN HFA Inhale 2 puffs into the lungs every 6 (six) hours as needed.   aspirin EC 81 MG tablet Take 81 mg by mouth daily.   Auvi-Q 0.3 mg/0.3 mL Soaj injection Generic drug: EPINEPHrine Inject 0.3 mg into the muscle as directed. For immunotherapy (allergy shots) anaphylactic reactions   bifidobacterium infantis capsule Take 1 capsule by mouth daily.   Calcium 600+D 600-200 MG-UNIT Tabs Generic drug: Calcium Carbonate-Vitamin D Take 1 tablet by mouth daily.   Dupixent 300 MG/2ML prefilled syringe Generic drug: dupilumab INJECT 300 MG SUBCUTANEOUSLY EVERY OTHER WEEK   Estradiol 10 MCG Tabs vaginal tablet Place vaginally.   fexofenadine 180 MG tablet Commonly known as: ALLEGRA Take  180 mg by mouth every morning.   fluticasone 50 MCG/ACT nasal spray Commonly known as: FLONASE Place 2 sprays into both nostrils 2 (two) times daily.   FreeStyle Libre 14 Day Reader Hardie Pulley USE AS DIRECTED   FreeStyle Libre 14 Day Sensor Misc USE AS DIRECTED PER MD   furosemide 20 MG tablet Commonly known as: LASIX TAKE 1 TABLET(20 MG) BY MOUTH DAILY AS NEEDED FOR FLUID RETENTION OR SWELLING   hydrocortisone 2.5 % cream APPLY EXTERNALLY TO THE AFFECTED AREA TWICE DAILY AS NEEDED What changed: See the new instructions.   metFORMIN 1000 MG tablet Commonly known as: GLUCOPHAGE Take  1 tablet (1,000 mg total) by mouth 2 (two) times daily.   montelukast 10 MG tablet Commonly known as: SINGULAIR Take 10 mg by mouth at bedtime.   multivitamin with minerals tablet Take 1 tablet by mouth daily.   omeprazole 40 MG capsule Commonly known as: PRILOSEC Take 40 mg by mouth at bedtime.   pantoprazole 40 MG tablet Commonly known as: PROTONIX TAKE 1 TABLET(40 MG) BY MOUTH DAILY   PRESCRIPTION MEDICATION Allergy Shots   rosuvastatin 5 MG tablet Commonly known as: CRESTOR TAKE 1 TABLET(5 MG) BY MOUTH DAILY   sodium chloride HYPERTONIC 3 % nebulizer solution Take by nebulization as needed for other. Inhale contents of 1 vial via nebulizer twice daily as directed   Spiriva Respimat 1.25 MCG/ACT Aers Generic drug: Tiotropium Bromide Monohydrate INHALE 2 PUFFS BY MOUTH EVERY DAY   Symbicort 160-4.5 MCG/ACT inhaler Generic drug: budesonide-formoterol Inhale 2 puffs into the lungs 2 (two) times daily.         OBJECTIVE:   PHYSICAL EXAM: VS: BP 132/80   Pulse 98   Ht 5\' 4"  (1.626 m)   Wt 186 lb 12.8 oz (84.7 kg)   SpO2 98%   BMI 32.06 kg/m    EXAM: General: Pt appears well and is in NAD  Neck: General: Supple without adenopathy. Thyroid:  No goiter or nodules appreciated.   Lungs: Clear with good BS bilat with no rales, rhonchi, or wheezes  Heart: Auscultation: RRR.    Abdomen: Normoactive bowel sounds, soft, nontender, without masses or organomegaly palpable  Extremities:  BL LE: No pretibial edema normal ROM and strength.  Mental Status: Judgment, insight: Intact Orientation: Oriented to time, place, and person Mood and affect: No depression, anxiety, or agitation     DATA REVIEWED: Results for WAJIHA, VERSTEEG (MRN Karen Barnes) as of 09/12/2019 12:28  Ref. Range 09/12/2019 10:16  TSH Latest Ref Range: 0.35 - 4.50 uIU/mL 1.65  T4,Free(Direct) Latest Ref Range: 0.60 - 1.60 ng/dL 11/12/2019    Thyroid ultrasound 09/02/2019 The previously biopsied right posteroinferior nodule measures 1.4 x 0.7 x 1.2 cm, previously 1.3 x 0.7 x 0.9 cm. No significant interval change. Correlate with prior pathology.  There are additional subcentimeter right thyroid hypoechoic nodules noted all measuring 7 mm or less in size. These would not meet criteria for any biopsy or follow-up and are not fully described by TI rads criteria.  Normal vascularity. Minor thyroid heterogeneity. No significant left thyroid abnormality.  Mildly prominent left cervical lymph node with a short axis measurement of 1 cm. No other enlarged lymph nodes.  IMPRESSION: Stable 1.4 cm right posteroinferior thyroid nodule previously biopsied. Correlate with prior pathology.  No new or enlarging nodule  Nonspecific 1 cm left cervical lymph node.    FNA 12/04/2017  THYROID, FINE NEEDLE ASPIRATION RIGHT LOBE, RIGHT MID POLE (SPECIMEN 1 OF 1 COLLECTED 12/04/17) CONSISTENT WITH BENIGN FOLLICULAR NODULE (BETHESDA CATEGORY II).  ASSESSMENT / PLAN / RECOMMENDATIONS:   1. Multinodular Goiter:   - She is biochemically euthyroid.  - S/P FNA of the right thyroid nodule in October,2019 with benign cytology.  - Repeat ultrasound in 08/2019 assures stability  - Reassurance provided at this time       F/u in 1 yr   Signed electronically by: 09/2019, MD  [facility]  Endocrinology  [facility] [address] [place] [place], [place] Phone: [phone] FAX: [phone]      CC: [phone], DO [facility]  [address] [place] [address] Phone: [phone]  Fax: [phone]   Return to Endocrinology clinic as below: Future Appointments  Date Time Provider Department Center  12/17/2019  3:40 PM Lars Masson, MD [facility]  03/10/2020  8:00 AM Overton Mam, DO [facility]  09/10/2020  9:50 AM Debie Ashline, Konrad Dolores, MD [facility] None

## 2019-09-13 IMAGING — US US THYROID
1 series · 13 of 25 positions shown · non-contrast
Comparison: Chest CT-11/07/2017

CLINICAL DATA: Incidental on CT. Right-sided thyroid nodule
incidentally noted on chest CT performed 11/08/2027

EXAM:
THYROID ULTRASOUND
TECHNIQUE: Ultrasound examination of the thyroid gland and adjacent soft
tissues was performed.

[Series 1: us thyroid · 0.07mm/px · 13 of 73 slices shown]
[im 1/73]
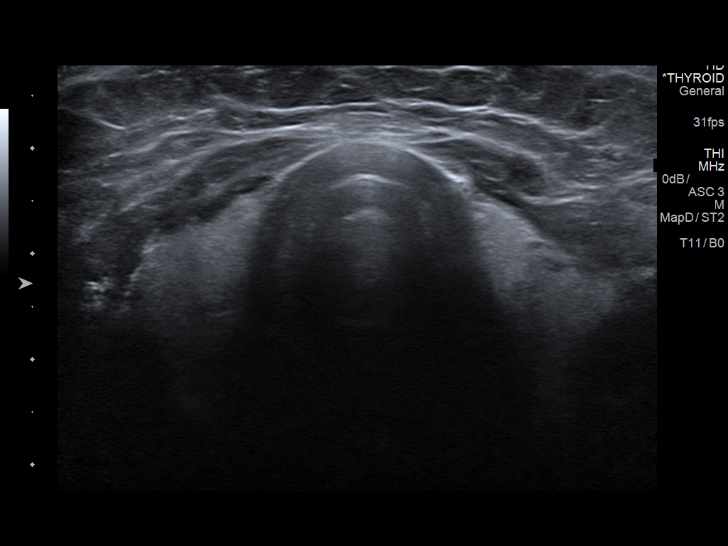
[im 7/73]
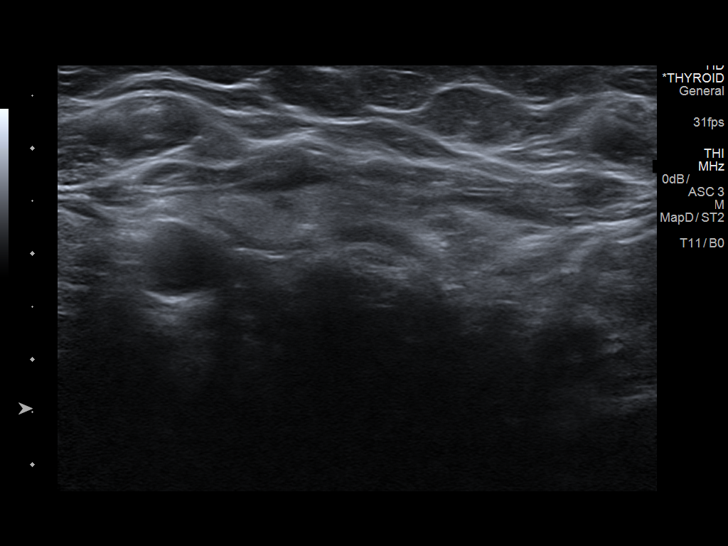
[im 13/73]
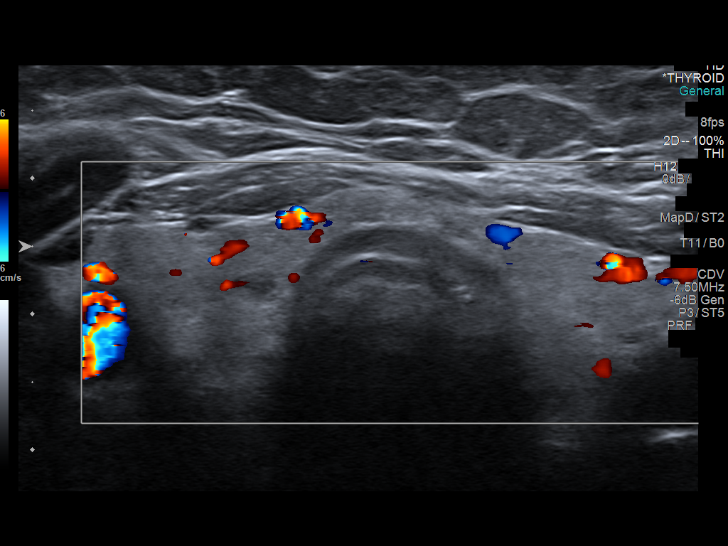
[im 19/73]
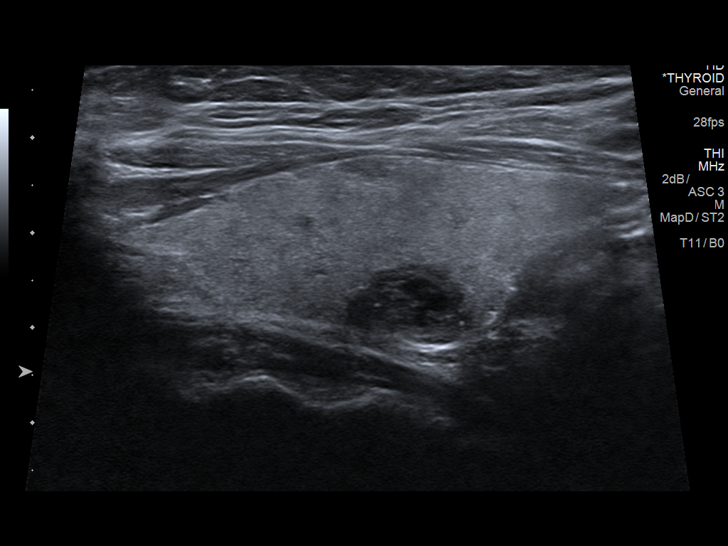
[im 25/73]
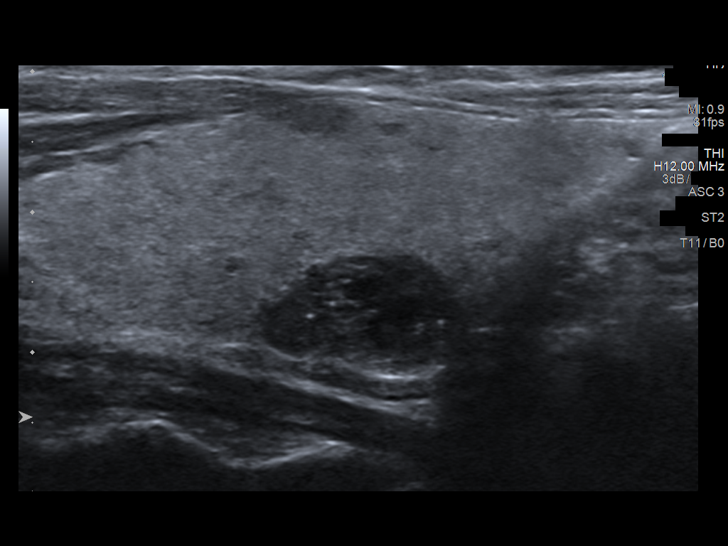
[im 31/73]
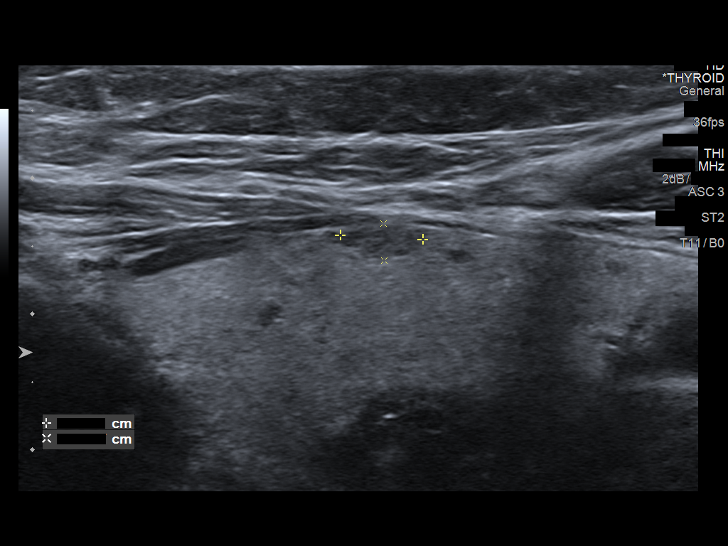
[im 37/73]
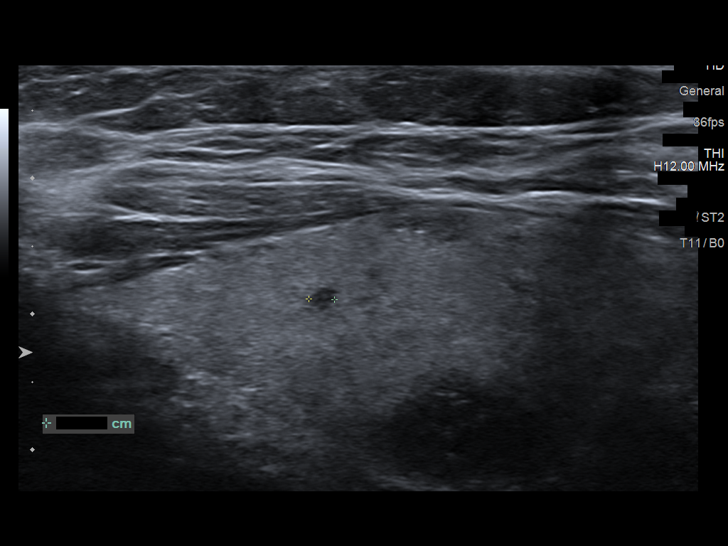
[im 43/73]
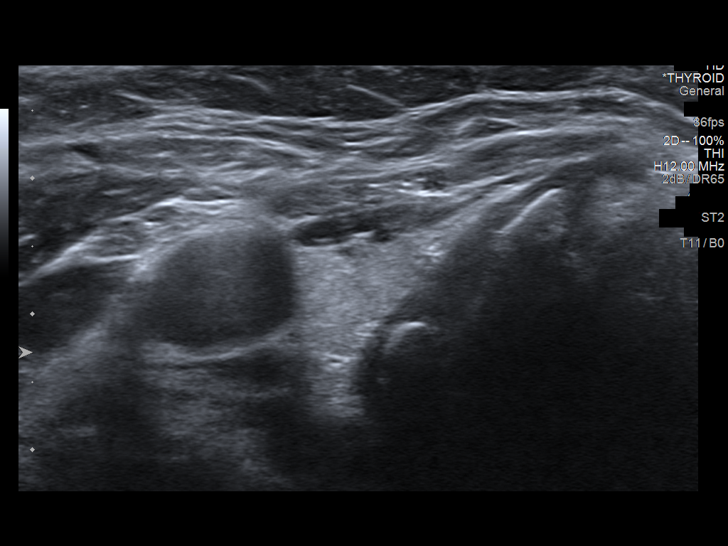
[im 49/73]
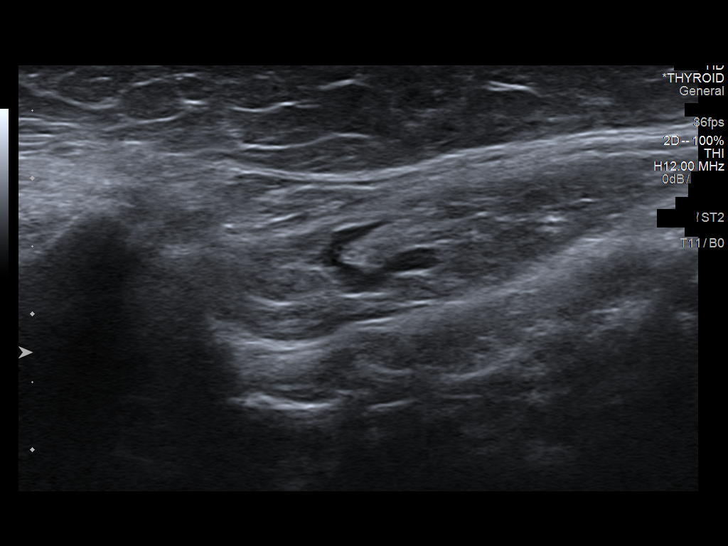
[im 55/73]
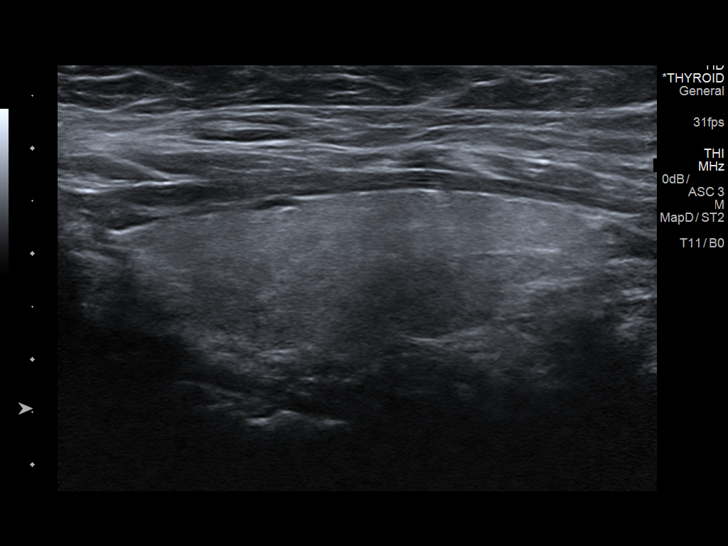
[im 61/73]
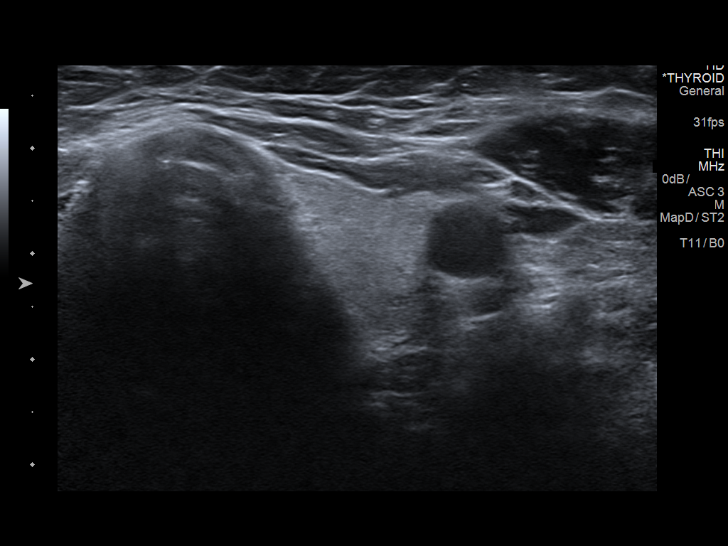
[im 67/73]
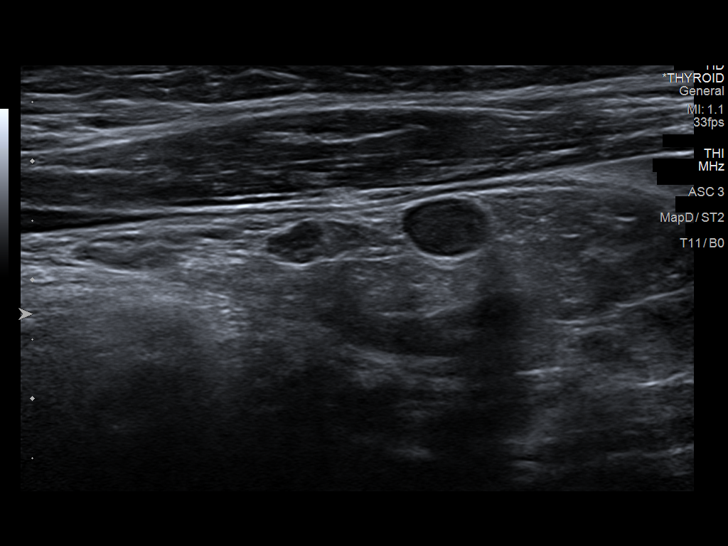
[im 73/73]
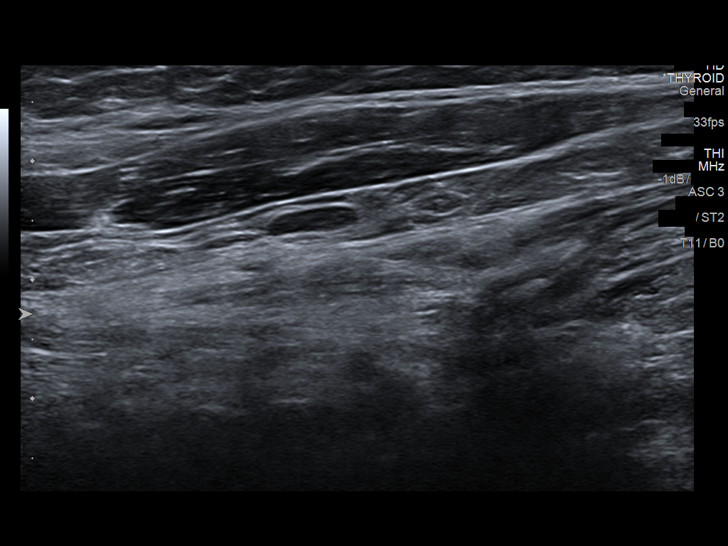

[13 of 25 positions shown; findings below may reference images not displayed]

FINDINGS: Parenchymal Echotexture: Mildly heterogenous

Isthmus: Normal in size measures 0.3 cm in diameter

Right lobe: Normal in size measuring 5.0 x 2.0 x 1.5 cm

Left lobe: Normal in size measuring 4.4 x 1.6 x 1.5 cm

_________________________________________________________

Estimated total number of nodules >/= 1 cm: 1

Number of spongiform nodules >/=  2 cm not described below (TR1): 0

Number of mixed cystic and solid nodules >/= 1.5 cm not described
below (TR2): 0

_________________________________________________________

Nodule # 1:

Location: Right; Mid - this nodule correlates with the nodule seen
on preceding chest CT.

Maximum size: 1.5 cm; Other 2 dimensions: 1.0 x 0.9 cm

Composition: solid/almost completely solid (2)

Echogenicity: hypoechoic (2)

Shape: not taller-than-wide (0)

Margins: extra-thyroidal extension (3)

Echogenic foci: punctate echogenic foci (3)

ACR TI-RADS total points: 10.

ACR TI-RADS risk category: TR5 (>/= 7 points).

ACR TI-RADS recommendations:

**Given size (>/= 1.0 cm) and appearance, fine needle aspiration of
this highly suspicious nodule should be considered based on TI-RADS
criteria.

_________________________________________________________

There is a punctate (sub 6 mm) hypoechoic nodule involving the
anterior mid aspect the right lobe of the thyroid which does not
meet imaging criteria to recommend percutaneous sampling or
continued dedicated follow-up.
IMPRESSION: Nodule #1 within the right lobe of the thyroid, correlating with the
nodule seen on preceding chest CT, meets imaging criteria to
recommend percutaneous sampling as clinically indicated.

The above is in keeping with the ACR TI-RADS recommendations - [HOSPITAL] 8582;[DATE].

## 2019-09-20 LAB — HM MAMMOGRAPHY: HM Mammogram: NORMAL (ref 0–4)

## 2019-10-05 ENCOUNTER — Other Ambulatory Visit: Payer: Self-pay | Admitting: Family Medicine

## 2019-10-06 NOTE — Telephone Encounter (Signed)
Medication refill for Metformin.   08/09/19 Last OV 07/11/19 Last refill date // #180 // refills - 0  Refill request approved and submitted to pharmacy per standing orders.

## 2019-10-08 ENCOUNTER — Other Ambulatory Visit: Payer: Self-pay | Admitting: Family Medicine

## 2019-10-09 NOTE — Telephone Encounter (Signed)
Medication refill request for Metformin approved and submitted to pharmacy per standing orders.

## 2019-10-20 ENCOUNTER — Telehealth: Payer: Self-pay | Admitting: Pulmonary Disease

## 2019-10-20 NOTE — Telephone Encounter (Signed)
Primary Pulmonologist: Dr.Mannam Last office visit and with whom: 08/05/19 Dr.Mannam What do we see them for (pulmonary problems): Bronchiectasis Last OV assessment/plan: Assessment:  Bronchiectasis Continue chest vest, Mucinex. Hypertonic saline for ciliary clearance  Severe persistent asthma Continue Symbicort, Spiriva, Dupixent She can use over-the-counter Zyrtec for postnasal drip She will check with Dr. Madie Reno about resuming her allergy shots  Plan/Recommendations: Follow-up in 6 months  [name] MD [facility] 08/05/2019, 9:22 AM  CC: Karen Dun, MD   Was appointment offered to patient (explain)?     Reason for call: calling to see if she could get abx because she has had congestion and a burning sensation when coughing that  started today. coughing up yeloow tint phlegm started saturday .Pateint stated no fever. Patient got her Flu vaccine on saturday.  pharmacy of choice is [facility] on [address]  Dr.Mannam can you please advise  Thank you  (examples of things to ask: : When did symptoms start? Fever? Cough? Productive? Color to sputum? More sputum than usual? Wheezing? Have you needed increased oxygen? Are you taking your respiratory medications? What over the counter measures have you tried?)  Allergies  Allergen Reactions  . Butorphanol Tartrate Other (See Comments)    REACTION: hallucinations  . Hydrocodone Other (See Comments)    HALLUCINATIONS  . Roxicet [Oxycodone-Acetaminophen] Other (See Comments)    HALLUCINATIONS  . Doxycycline     DOES NOT WORK FOR PATIENT  . Penicillins Hives    Occurred at age 49/CHILDHOOD ALLERGY Has patient had a PCN reaction causing immediate rash, facial/tongue/throat swelling, SOB or lightheadedness with hypotension: Unknown Has patient had a PCN reaction causing severe rash involving mucus membranes or skin necrosis: Unknown Has patient had a PCN reaction that required hospitalization:  Unknown Has patient had a PCN reaction occurring within the last 10 years: No If all of the above answers are "NO", then may proceed with Cephalosporin use.     Immunization History  Administered Date(s) Administered  . Influenza Inj Mdck Quad Pf 10/01/2018  . Influenza Split 03/12/2012, 11/12/2013, 11/07/2014  . Influenza Whole 12/08/2009  . Influenza,inj,Quad PF,6+ Mos 12/06/2012, 11/14/2017  . Influenza-Unspecified 11/06/2016, 10/01/2018  . PFIZER SARS-COV-2 Vaccination 05/12/2019, 06/03/2019  . Pneumococcal Conjugate-13 09/24/2015  . Pneumococcal Polysaccharide-23 09/18/2014  . Td 03/07/2007  . Tdap 03/30/2017  . Zoster Recombinat (Shingrix) 10/26/2018, 01/03/2019

## 2019-10-20 NOTE — Telephone Encounter (Signed)
Pt returning call regarding antibiotic - [phone]

## 2019-10-20 NOTE — Telephone Encounter (Signed)
Left a voicemail for patient to call our office back regarding prior message.  °

## 2019-10-21 NOTE — Telephone Encounter (Signed)
Spoke with Karen Barnes, states that she does not feel comfortable taking a zpak.  Karen Barnes states that zpaks never clear up her bronchiectasis exacerbations and typically require her to take several rounds of this medicine.  Karen Barnes is requesting something "stronger", suggested levaquin or doxycycline.   Dr. Isaiah Serge please advise, thanks

## 2019-10-21 NOTE — Telephone Encounter (Signed)
Call in Z-Pak 

## 2019-10-22 MED ORDER — DOXYCYCLINE HYCLATE 100 MG PO TABS
100.0000 mg | ORAL_TABLET | Freq: Two times a day (BID) | ORAL | 0 refills | Status: DC
Start: 2019-10-22 — End: 2019-10-27

## 2019-10-22 NOTE — Telephone Encounter (Signed)
Spoke with patient regarding prior message. Per Dr.Mannam ok to send in Doxycycline 100mg  BID for 7days into her pharmacy of choice. Patient's voice was understanding . Nothing else further needed.

## 2019-10-22 NOTE — Telephone Encounter (Signed)
Order Doxy 100 mg bid for 7 days

## 2019-10-24 ENCOUNTER — Telehealth: Payer: Self-pay | Admitting: Pulmonary Disease

## 2019-10-24 MED ORDER — PREDNISONE 20 MG PO TABS: ORAL_TABLET | ORAL | 0 refills | Status: DC

## 2019-10-24 NOTE — Telephone Encounter (Signed)
Spoke with patient regarding prior message. Per Dr.Mannam  Let's wait for the COVID test to come back before making an office appointment If test is positive then she will need to let us know so she can get the monoclonal antibody  Give prescription for prednisone 40 mg/day for 5 days If she continues to worsen then she may need ED visit.  Patient is aware and voice was understanding of POC and medication sent into her pharmacy. Nothing else further needed.

## 2019-10-24 NOTE — Telephone Encounter (Signed)
Primary Pulmonologist: Dr.Mannam Last office visit and with whom:  08/02/19 What do we see them for (pulmonary problems):Severe persistent asthma with exacerbation,Bronchiectasis without complication      Last OV assessment/plan:   Assessment:  Bronchiectasis Continue chest vest, Mucinex. Hypertonic saline for ciliary clearance  Severe persistent asthma Continue Symbicort, Spiriva, Dupixent She can use over-the-counter Zyrtec for postnasal drip She will check with Dr. Madie Reno about resuming her allergy shots  Plan/Recommendations: Follow-up in 6 months  [name] MD [facility] 08/05/2019, 9:22 AM  CC: Dianne Dun, MD      Patient Instructions by [name], MD at 08/05/2019 9:00 AM Author: [name], MD Author Type: Physician Filed: 08/05/2019 9:31 AM  Note Status: Signed Cosign: Cosign Not Required Encounter Date: 08/05/2019  Editor: [name], MD (Physician)               I am glad you are doing well with regard to your breathing Continue inhalers as prescribed and Dupixent injection Check with Dr. Madie Reno about resuming allergy shots You can use Zyrtec over-the-counter for postnasal drip in addition to Flonase.  Follow-up in 6 months    Instructions  I am glad you are doing well with regard to your breathing Continue inhalers as prescribed and Dupixent injection Check with Dr. Madie Reno about resuming allergy shots You can use Zyrtec over-the-counter for postnasal drip in addition to Flonase.  Follow-up in 6 months        After Visit Summary (Printed 08/05/2019) Media From this encounter Electronic signature on 08/01/2019 9:38 AM - E-signed  Communication Routing History  None  No questionnaires available.           Orders Placed   None Medication Changes      (Completed Course)     (Completed Course)     (Completed Course)     (No longer needed (for PRN medications))     (Completed  Course)   Medication List  Visit Diagnoses    Severe persistent asthma with exacerbation   Bronchiectasis without complication (HCC)   Problem List  Level of Service  Level of Service  PR OFFICE/OUTPATIENT ESTABLISHED HIGH MDM 40-54 MIN [70962]  All Charges for This Encounter  Code  83662  Description: PR OFFICE/OUTPATIENT ESTABLISHED HIGH MDM 40-54 MIN  Service Date: 08/05/2019  Service Provider: [name], MD  Qty: 1   Was appointment offered to patient (explain)?     Reason for call:Wheezing,Cough,SOB,0 fever. Patient has been taking Doxycycline 100mg  2x's daily started 10/22/19 patient stated she is feeling worse. Patient has been using her Chest vest 3-4 rimes a day to help .Pateint did get a COVID test done yesterday waiting for result's. Patient stated she is willing to come into the office.   Dr.Mannam can you please advise  Thank you     Allergies  Allergen Reactions  . Butorphanol Tartrate Other (See Comments)    REACTION: hallucinations  . Hydrocodone Other (See Comments)    HALLUCINATIONS  . Roxicet [Oxycodone-Acetaminophen] Other (See Comments)    HALLUCINATIONS  . Doxycycline     DOES NOT WORK FOR PATIENT  . Penicillins Hives    Occurred at age 2/CHILDHOOD ALLERGY Has patient had a PCN reaction causing immediate rash, facial/tongue/throat swelling, SOB or lightheadedness with hypotension: Unknown Has patient had a PCN reaction causing severe rash involving mucus membranes or skin necrosis: Unknown Has patient had a PCN reaction that required hospitalization: Unknown Has patient had a PCN reaction occurring  within the last 10 years: No If all of the above answers are "NO", then may proceed with Cephalosporin use.     Immunization History  Administered Date(s) Administered  . Influenza Inj Mdck Quad Pf 10/01/2018  . Influenza Split 03/12/2012, 11/12/2013, 11/07/2014  . Influenza Whole 12/08/2009  . Influenza,inj,Quad PF,6+ Mos 12/06/2012,  11/14/2017  . Influenza-Unspecified 11/06/2016, 10/01/2018  . PFIZER SARS-COV-2 Vaccination 05/12/2019, 06/03/2019  . Pneumococcal Conjugate-13 09/24/2015  . Pneumococcal Polysaccharide-23 09/18/2014  . Td 03/07/2007  . Tdap 03/30/2017  . Zoster Recombinat (Shingrix) 10/26/2018, 01/03/2019

## 2019-10-24 NOTE — Telephone Encounter (Signed)
Let's wait for the COVID test to come back before making an office appointment If test is positive then she will need to let us know so she can get the monoclonal antibody  Give prescription for prednisone 40 mg/day for 5 days If she continues to worsen then she may need ED visit.

## 2019-10-27 ENCOUNTER — Telehealth: Payer: Self-pay | Admitting: Pulmonary Disease

## 2019-10-27 MED ORDER — LEVOFLOXACIN 750 MG PO TABS
750.0000 mg | ORAL_TABLET | Freq: Every day | ORAL | 0 refills | Status: DC
Start: 2019-10-27 — End: 2019-11-14

## 2019-10-27 MED ORDER — PREDNISONE 20 MG PO TABS: ORAL_TABLET | ORAL | 0 refills | Status: DC

## 2019-10-27 NOTE — Telephone Encounter (Signed)
Called and spoke with pt in regards to info stated by Dr. Isaiah Serge.  Pt had covid test performed 9/16 and received the results 9/18. Pt did have the PCR covid test.  Pt stated results were negative.  Pt is currently on prednisone as she stated she has one more dose of 40mg  left to take tomorrow 9/21.  Stated to pt that Dr. 10/21 wanted Isaiah Serge to send zpak to pharmacy for her and she stated that a zpak has never worked for her in the past.  Pt said abx that works best for her when she has a flare up is levaquin. She said after being on mult abx in the past, once she is placed on levaquin, it knocks her symptoms out.  Dr. [place], please advise.

## 2019-10-27 NOTE — Telephone Encounter (Signed)
Primary Pulmonologist: Dr.Mannam Last office visit and with whom: 08/05/19 Dr.Mannam What do we see them for (pulmonary problems): Severe persistent Asthma,Bronchitis Last OV assessment/plan:  Assessment:  Bronchiectasis Continue chest vest, Mucinex. Hypertonic saline for ciliary clearance  Severe persistent asthma Continue Symbicort, Spiriva, Dupixent She can use over-the-counter Zyrtec for postnasal drip She will check with Dr. Madie Reno about resuming her allergy shots  Plan/Recommendations: Follow-up in 6 months  [name] MD [facility] 08/05/2019, 9:22 AM  CC: Dianne Dun, MD      Patient Instructions by [name], MD at 08/05/2019 9:00 AM Author: [name], MD Author Type: Physician Filed: 08/05/2019 9:31 AM  Note Status: Signed Cosign: Cosign Not Required Encounter Date: 08/05/2019  Editor: [name], MD (Physician)               I am glad you are doing well with regard to your breathing Continue inhalers as prescribed and Dupixent injection Check with Dr. Madie Reno about resuming allergy shots You can use Zyrtec over-the-counter for postnasal drip in addition to Flonase.  Follow-up in 6 months    Instructions  I am glad you are doing well with regard to your breathing Continue inhalers as prescribed and Dupixent injection Check with Dr. Madie Reno about resuming allergy shots You can use Zyrtec over-the-counter for postnasal drip in addition to Flonase.  Follow-up in 6 months        After Visit Summary (Printed 08/05/2019) Media From this encounter Electronic signature on 08/01/2019 9:38 AM - E-signed  Communication Routing History  None  No questionnaires available.         Was appointment offered to patient (explain)?     Reason for call: Congested,Headache, Cough,Mucus is yellow 0 fever.Patient has a negative COVID test.Patient has 1 more day of antibiotics.  Dr.Mannam can you please  advise.   Thank you   Allergies  Allergen Reactions  . Butorphanol Tartrate Other (See Comments)    REACTION: hallucinations  . Hydrocodone Other (See Comments)    HALLUCINATIONS  . Roxicet [Oxycodone-Acetaminophen] Other (See Comments)    HALLUCINATIONS  . Doxycycline     DOES NOT WORK FOR PATIENT  . Penicillins Hives    Occurred at age 62/CHILDHOOD ALLERGY Has patient had a PCN reaction causing immediate rash, facial/tongue/throat swelling, SOB or lightheadedness with hypotension: Unknown Has patient had a PCN reaction causing severe rash involving mucus membranes or skin necrosis: Unknown Has patient had a PCN reaction that required hospitalization: Unknown Has patient had a PCN reaction occurring within the last 10 years: No If all of the above answers are "NO", then may proceed with Cephalosporin use.     Immunization History  Administered Date(s) Administered  . Influenza Inj Mdck Quad Pf 10/01/2018  . Influenza Split 03/12/2012, 11/12/2013, 11/07/2014  . Influenza Whole 12/08/2009  . Influenza,inj,Quad PF,6+ Mos 12/06/2012, 11/14/2017  . Influenza-Unspecified 11/06/2016, 10/01/2018  . PFIZER SARS-COV-2 Vaccination 05/12/2019, 06/03/2019  . Pneumococcal Conjugate-13 09/24/2015  . Pneumococcal Polysaccharide-23 09/18/2014  . Td 03/07/2007  . Tdap 03/30/2017  . Zoster Recombinat (Shingrix) 10/26/2018, 01/03/2019

## 2019-10-27 NOTE — Telephone Encounter (Signed)
Called and spoke with pt letting her know that we were going to send levaquin to the pharmacy for her as well as another 5 day course of prednisone. Pt verbalized understanding. Verified preferred pharmacy and sent Rx in for pt. Nothing further needed.

## 2019-10-27 NOTE — Telephone Encounter (Signed)
Did she get the COVID test as we had recommended? I think she is already on prednisone  Please call in z pack. thanks

## 2019-10-27 NOTE — Telephone Encounter (Signed)
Ok to order levaquin 750 mg/day for 7 days Give 5 more day of prednisone 40 mg/day

## 2019-11-03 ENCOUNTER — Telehealth: Payer: Self-pay | Admitting: Cardiology

## 2019-11-03 ENCOUNTER — Telehealth: Payer: Self-pay | Admitting: Pulmonary Disease

## 2019-11-03 NOTE — Telephone Encounter (Signed)
Spoke with patient regarding prior message.Patient just recently finished course of medication and still not feeling well. Offered patient a appt to come into the office to see Brain to be evaluated .Patint's voice was understanding . Nothing else further needed.

## 2019-11-03 NOTE — Telephone Encounter (Signed)
Spoke with pt who states she was diagnosed with pneumonia 12 days ago.  Pt has returned to work today and was walking up a flight of stairs began having a "coughing jag" and has now developed severe CP with radiation.  Pt has taken some Tums with minimal relief.  Pt does not have nitro prescribed.    Pt advised with her current status of pneumonia and CP with radiation she should call 911 to have EMS come and evaluate her for transport to ED.  Pt verbalizes understanding and states she will have someone call now. Thanked Charity fundraiser for call.

## 2019-11-03 NOTE — Telephone Encounter (Signed)
Pt c/o of Chest Pain: 1. Are you having CP right now? Yes  2. Are you experiencing any other symptoms (ex. SOB, nausea, vomiting, sweating)? SOB her teeth hurst and jaw and neck and shoulders  3. How long have you been experiencing CP? Just started today after walking up some stairs 4. Is your CP continuous or coming and going? No  5. Have you taken Nitroglycerin? no

## 2019-11-04 ENCOUNTER — Other Ambulatory Visit: Payer: Self-pay

## 2019-11-04 ENCOUNTER — Encounter: Payer: Self-pay | Admitting: Pulmonary Disease

## 2019-11-04 ENCOUNTER — Ambulatory Visit (INDEPENDENT_AMBULATORY_CARE_PROVIDER_SITE_OTHER): Payer: BC Managed Care – PPO

## 2019-11-04 ENCOUNTER — Ambulatory Visit: Payer: BC Managed Care – PPO | Admitting: Pulmonary Disease

## 2019-11-04 VITALS — BP 124/82 | HR 79 | Temp 97.2°F | Ht 64.0 in | Wt 186.2 lb

## 2019-11-04 DIAGNOSIS — J471 Bronchiectasis with (acute) exacerbation: Secondary | ICD-10-CM

## 2019-11-04 DIAGNOSIS — J4550 Severe persistent asthma, uncomplicated: Secondary | ICD-10-CM

## 2019-11-04 DIAGNOSIS — R918 Other nonspecific abnormal finding of lung field: Secondary | ICD-10-CM

## 2019-11-04 DIAGNOSIS — R0602 Shortness of breath: Secondary | ICD-10-CM | POA: Diagnosis not present

## 2019-11-04 DIAGNOSIS — K219 Gastro-esophageal reflux disease without esophagitis: Secondary | ICD-10-CM

## 2019-11-04 NOTE — Assessment & Plan Note (Signed)
Known severe GERD Managed by Dr. Ewing Schlein Currently taking 40 mg of Protonix and the morning, 40 mg of omeprazole in the evening Recent chest pain episode that was resolved by taking additional dose of PPI  Plan: Schedule follow-up with Dr. Ewing Schlein

## 2019-11-04 NOTE — Assessment & Plan Note (Signed)
Plan: Continue Symbicort 160 Continue Spiriva Respimat 1.25 Continue Dupixent injections 1 week follow-up with our office

## 2019-11-04 NOTE — Assessment & Plan Note (Signed)
October/2019 high-resolution CT chest shows mild cylindrical bronchiectasis Patient reports clinical improvement with therapy vest Patient is status post doxycycline, Levaquin course recently finished on 11/03/2019 Patient reports adherence to hypertonic saline nebs, therapy vest use 2 sessions twice daily Patient reports adherence to Symbicort 160, Spiriva Respimat 1.25 and Mucinex Most recent sputum sample results from May/2021 show no abnormal sputum  Plan: Chest x-ray today Based off chest x-ray results of potential inflammation will order CT chest to compare to October/2019 Continue hypertonic saline nebs Continue therapy vest Continue Symbicort 160 Continue Spiriva Respimat 1.25 Continue daily probiotic 1 week follow-up with our office Written out of work till 11/10/2019 given symptoms

## 2019-11-04 NOTE — Patient Instructions (Addendum)
You were seen today by [name], NP  for:   1. Shortness of breath 2. Abnormal findings on diagnostic imaging of lung  - CT Chest High Resolution; Future  Chest x-ray does show some mild inflammation of the lung, we will order a CT of your chest to further evaluate this  3. Bronchiectasis with acute exacerbation (HCC)  - DG Chest 2 View; Future - Respiratory or Resp and Sputum Culture; Future - CT Chest High Resolution; Future  We will test her sputum-respiratory sputum cup provided today  We will order a CT of her chest to further evaluate  Continue hypertonic saline nebs twice daily  Continue therapy vest  Bronchiectasis: This is the medical term which indicates that you have damage, dilated airways making you more susceptible to respiratory infection. Use a flutter valve 10 breaths twice a day or 4 to 5 breaths 4-5 times a day to help clear mucus out Let us know if you have cough with change in mucus color or fevers or chills.  At that point you would need an antibiotic. Maintain a healthy nutritious diet, eating whole foods Take your medications as prescribed    4. Severe persistent asthma without complication  Continue Symbicort >>> 2 puffs in the morning right when you wake up, rinse out your mouth after use, 12 hours later 2 puffs, rinse after use >>> Take this daily, no matter what >>> This is not a rescue inhaler   Spiriva Respimat 2.5 >>> 2 puffs daily >>> Do this every day >>>This is not a rescue inhaler   Continue Dupixent  Start nasal saline rinses twice daily Use distilled water Shake well Get bottle lukewarm like a baby bottle  Continue Singulair  Continue Allegra  Continue Flonase after nasal saline rinses  5. Gastroesophageal reflux disease, unspecified whether esophagitis present  Continue Protonix Continue omeprazole  Please schedule a follow-up with gastroenterology as discussed today   We recommend today:  Orders Placed This  Encounter  Procedures  . Respiratory or Resp and Sputum Culture    Standing Status:   Future    Standing Expiration Date:   11/03/2020  . DG Chest 2 View    Standing Status:   Future    Number of Occurrences:   1    Standing Expiration Date:   03/05/2020    Order Specific Question:   Reason for Exam (SYMPTOM  OR DIAGNOSIS REQUIRED)    Answer:   cough    Order Specific Question:   Preferred imaging location?    Answer:   Internal    Order Specific Question:   Radiology Contrast Protocol - do NOT remove file path    Answer:   [email or web address]  . CT Chest High Resolution    Standing Status:   Future    Standing Expiration Date:   11/03/2020    Order Specific Question:   Preferred imaging location?    Answer:   [place] [facility]    Order Specific Question:   Radiology Contrast Protocol - do NOT remove file path    Answer:   [email or web address]   Orders Placed This Encounter  Procedures  . Respiratory or Resp and Sputum Culture  . DG Chest 2 View  . CT Chest High Resolution   No orders of the defined types were placed in this encounter.   Follow Up:    Return in about 1 week (around 11/11/2019), or if symptoms worsen or fail to improve, for Follow up  with [name] FNP-C, After Chest CT.   Notification of test results are managed in the following manner: If there are  any recommendations or changes to the  plan of care discussed in office today,  we will contact you and let you know what they are. If you do not hear from [place], then your results are normal and you can view them through your  MyChart account , or a letter will be sent to you. Thank you again for trusting [place] with your care  - Thank you, [facility]    It is flu season:   >>> Best ways to protect herself from the flu: Receive the yearly flu vaccine, practice good hand hygiene washing with soap and also using hand  sanitizer when available, eat a nutritious meals, get adequate rest, hydrate appropriately       Please contact the office if your symptoms worsen or you have concerns that you are not improving.   Thank you for choosing [facility] for your healthcare, and for allowing [place] to partner with you on your healthcare journey. I am thankful to be able to provide care to you today.   [name] FNP-C

## 2019-11-04 NOTE — Assessment & Plan Note (Signed)
Chest x-ray showing potential pneumonitis  Plan: We will order high-resolution CT chest to further evaluate

## 2019-11-04 NOTE — Progress Notes (Signed)
  ID: Karen Barnes, female    DOB: 11-16-1960, 59 y.o.   MRN: [id number]  Chief Complaint  Patient presents with  . Follow-up    Asthma, Brochiectasis    Referring provider: Overton Mam, DO  HPI:  59 year old female never smoker, first seen in 2017 for asthma and a pulmonary nodule  She has severe GERD and is followed by Dr. Ewing Schlein.  She started on immunotherapy in 2017 She was born prematurely at 81 weeks.    Maintenance:  Symbicort 160, Spiriva Resp 1.25, Dupixent  Patient of Dr. Isaiah Serge  11/04/2019  - Visit   59 year old female followed in our office for asthma.  She is followed by Dr. Isaiah Serge.  Patient contacted our office on 10/20/2019 reporting that she needed an antibiotic.  She was treated with doxycycline on 10/20/2019, patient reports that she is not comfortable taking the Z-Pak so she was treated with doxycycline by Dr. Isaiah Serge.  Patient contacted our office again on 10/24/2019 reporting that she did obtain an outpatient Covid test was waiting for those results she has been taking the doxycycline and symptoms were not improving.  She was given a course of prednisone and given ED precautions.  Patient contacted our office again on 10/27/2019 reporting that she had a negative Covid test and only 1 more day of antibiotics she remains congested, had a cough and mucus patient was then treated with Levaquin for 7 days on 10/27/2019 and 5 more days of prednisone at 40 mg patient contacted our office again on 11/03/2019 reporting that she still is not feeling well.  Patient has had moderate clinical relief after Levaquin and prednisone.  She reports that symptoms though quickly returned after antibiotic use.  She is still reporting discolored sinus drainage.  She is doing nasal saline rinses 1 time a day.  She continues to be adherent to hypertonic saline nebs, 2 chest therapy sessions twice daily, she remains adherent to Symbicort 160, Spiriva Respimat 1.25 and Dupixent which  she received last week.  Chest x-ray obtained today shows potential pneumonitis.  We will discuss and evaluate this today.  Patient reports atypical chest pain on 11/03/2019 when she tried to return back to work.  She works as a Tax inspector.  This requires a significant mental walking and moving around at school.  She had a episode of chest pain after climbing 28 stairs and having a coughing fit.  She then left work early and drove home.  She felt that she was having worsened acid reflux pain despite taking her pantoprazole 40 mg in the morning and omeprazole 40 mg in the afternoon.  She took an additional dose of omeprazole to help with relieving acid reflux pain.  She also contacted EMS due to this ongoing chest pain.  She reports that the EKG was normal.  Symptoms improved after taking the omeprazole additional dose.  She is followed by Dr. Ewing Schlein for gastroenterology.  She is unsure when her next follow-up with him is.  She believes she is due for a colonoscopy in spring/2022.  Last sputum cultures obtained were in May/2021 which were normal.  Tests:   Imaging: November 07, 2017 high-resolution CT scan of the chest images : Mild cylindrical bronchiectasis particularly in the left lower lobe, some in the right lower lobe.  10/18/2015 chest CT images showing normal pulmonary parenchyma with the exception of a 3.4 mm nodule in the left lower lobe.  Chest x-ray 06/10/2019-clear lungs  Pulmonary function test:  October 2019 ratio 90%, FVC 2.56 L 76% predicted, total lung capacity 4.32 L 84% predicted, DLCO 20.7 mL 84% predicted  Labs: October 2019 complement levels within normal limits, IgG subclasses all normal, alpha-1 antitrypsin level normal, CBC showed 300 absolute eosinophil count  11/03/2015 - Ige - 32  Heart catheterization: October 2019 right heart catheterization showed mean PA pressure 24, pulmonary capillary wedge pressure 19, PVR 0.7 Woods units   FENO:  Lab Results   Component Value Date   NITRICOXIDE 22 11/02/2017    PFT: PFT Results Latest Ref Rng & Units 11/13/2017  FVC-Pre L 2.56  FVC-Predicted Pre % 76  FVC-Post L 2.55  FVC-Predicted Post % 76  Pre FEV1/FVC % % 90  Post FEV1/FCV % % 91  FEV1-Pre L 2.32  FEV1-Predicted Pre % 89  FEV1-Post L 2.33  DLCO uncorrected ml/min/mmHg 20.72  DLCO UNC% % 84  DLCO corrected ml/min/mmHg 21.26  DLCO COR %Predicted % 86  DLVA Predicted % 108  TLC L 4.32  TLC % Predicted % 84  RV % Predicted % 76    WALK:  No flowsheet data found.  Imaging: DG Chest 2 View  Result Date: 11/04/2019 CLINICAL DATA:  Cough.  Bronchiectasis. EXAM: CHEST - 2 VIEW COMPARISON:  06/18/2018. FINDINGS: Mediastinum and hilar structures normal. Heart size normal. Lung volumes. Very mild interstitial prominence cannot be excluded. Mild pneumonitis cannot be excluded. No pleural effusion or pneumothorax. IMPRESSION: Low lung volumes. Very mild interstitial prominence cannot be excluded. Mild pneumonitis cannot be excluded. Electronically Signed   By: Maisie Fus  Register   On: 11/04/2019 09:47    Lab Results:  CBC    Component Value Date/Time   WBC 4.5 03/04/2019 0801   RBC 4.22 03/04/2019 0801   HGB 11.6 (L) 03/04/2019 0801   HGB 11.8 09/02/2018 0935   HCT 35.0 (L) 03/04/2019 0801   HCT 38.0 09/02/2018 0935   PLT 169.0 03/04/2019 0801   PLT 304 09/02/2018 0935   MCV 83.1 03/04/2019 0801   MCV 81 09/02/2018 0935   MCH 25.0 (L) 09/02/2018 0935   MCH 26.5 11/26/2017 0701   MCHC 33.0 03/04/2019 0801   RDW 17.2 (H) 03/04/2019 0801   RDW 14.1 09/02/2018 0935   LYMPHSABS 1.2 03/04/2019 0801   LYMPHSABS 1.4 09/02/2018 0935   MONOABS 0.4 03/04/2019 0801   EOSABS 0.1 03/04/2019 0801   EOSABS 0.2 09/02/2018 0935   BASOSABS 0.0 03/04/2019 0801   BASOSABS 0.0 09/02/2018 0935    BMET    Component Value Date/Time   NA 139 03/04/2019 0801   K 3.5 03/04/2019 0801   CL 101 03/04/2019 0801   CO2 30 03/04/2019 0801   GLUCOSE  155 (H) 03/04/2019 0801   GLUCOSE 118 08/26/2009 0000   BUN 10 03/04/2019 0801   CREATININE 0.65 03/04/2019 0801   CREATININE 0.93 09/24/2015 1454   CALCIUM 9.4 03/04/2019 0801   GFRNONAA >60 11/26/2017 0701   GFRAA >60 11/26/2017 0701    BNP No results found for: BNP  ProBNP No results found for: PROBNP  Specialty Problems      Pulmonary Problems   Asthmatic bronchitis with acute exacerbation    Qualifier: Diagnosis of  By: Ardyth Man        Lung nodule   OSA (obstructive sleep apnea)   Bronchiectasis with acute exacerbation (HCC)   Bronchiectasis without complication [facility])    Chest imaging November 07, 2017 high-resolution CT scan of the chest images : Mild cylindrical bronchiectasis particularly in  the left lower lobe, some in the right lower lobe 10/18/2015 chest CT images  showing normal pulmonary parenchyma with the exception of a 3.4 mm nodule in the left lower lobe.  April / 2020 - started therapy vest       Severe persistent asthma   Pneumonia due to COVID-19 virus    Tested positive for COVID-19 in January/2021 Received outpatient monoclonal antibody infusion      Shortness of breath      Allergies  Allergen Reactions  . Butorphanol Tartrate Other (See Comments)    REACTION: hallucinations  . Hydrocodone Other (See Comments)    HALLUCINATIONS  . Roxicet [Oxycodone-Acetaminophen] Other (See Comments)    HALLUCINATIONS  . Doxycycline     DOES NOT WORK FOR PATIENT  . Penicillins Hives    Occurred at age 29/CHILDHOOD ALLERGY Has patient had a PCN reaction causing immediate rash, facial/tongue/throat swelling, SOB or lightheadedness with hypotension: Unknown Has patient had a PCN reaction causing severe rash involving mucus membranes or skin necrosis: Unknown Has patient had a PCN reaction that required hospitalization: Unknown Has patient had a PCN reaction occurring within the last 10 years: No If all of the above answers are "NO", then may  proceed with Cephalosporin use.     Immunization History  Administered Date(s) Administered  . Influenza Inj Mdck Quad Pf 10/01/2018  . Influenza Split 03/12/2012, 11/12/2013, 11/07/2014  . Influenza Whole 12/08/2009  . Influenza,inj,Quad PF,6+ Mos 12/06/2012, 11/14/2017  . Influenza-Unspecified 11/06/2016, 10/01/2018  . PFIZER SARS-COV-2 Vaccination 05/12/2019, 06/03/2019  . Pneumococcal Conjugate-13 09/24/2015  . Pneumococcal Polysaccharide-23 09/18/2014  . Td 03/07/2007  . Tdap 03/30/2017  . Zoster Recombinat (Shingrix) 10/26/2018, 01/03/2019    Past Medical History:  Diagnosis Date  . Asthma, moderate persistent    sees Dr Irena Cords  . Atrial tachycardia (HCC)   . Chronic diastolic CHF (congestive heart failure) (HCC)   . COVID-19 virus infection 01/2019  . Diabetes mellitus    A1C 6.2---->  2009  . Family history of adverse reaction to anesthesia    sister had cardiac arrest with rhinoplasty  . Fibrocystic breast   . GERD (gastroesophageal reflux disease)   . HH (hiatus hernia) 09/2014   Dr. Ewing Schlein  . History of echocardiogram    Echo 5/19: EF 55-60, normal wall motion, PASP 41  . History of hiatal hernia   . History of nuclear stress test    Nuclear stress test 5/19:  EF 64, normal perfusion, Low risk  . Hot flashes   . Hyperlipidemia   . Premature atrial contractions   . Pulmonary hypertension (HCC)   . PVC's (premature ventricular contractions)   . Recurrent cystitis    took  postcoital antibiotic for years, symptoms better after her hysterectomy 1996, symptoms re-surface in 2011  . RUQ abdominal pain 2016   EGD showed small HH, otherwise normal  . Wears glasses     Tobacco History: Social History   Tobacco Use  Smoking Status Never Smoker  Smokeless Tobacco Never Used   Counseling given: Yes   Continue to not smoke  Outpatient Encounter Medications as of 11/04/2019  Medication Sig  . albuterol (PROVENTIL HFA;VENTOLIN HFA) 108 (90 BASE) MCG/ACT  inhaler Inhale 2 puffs into the lungs every 6 (six) hours as needed.  Marland Kitchen aspirin EC 81 MG tablet Take 81 mg by mouth daily.  . bifidobacterium infantis (ALIGN) capsule Take 1 capsule by mouth daily.  . budesonide-formoterol (SYMBICORT) 160-4.5 MCG/ACT inhaler Inhale 2 puffs into  the lungs 2 (two) times daily.  . Calcium Carbonate-Vitamin D (CALCIUM 600+D) 600-200 MG-UNIT TABS Take 1 tablet by mouth daily.  . Continuous Blood Gluc Receiver (FREESTYLE LIBRE 14 DAY READER) DEVI USE AS DIRECTED  . Continuous Blood Gluc Sensor (FREESTYLE LIBRE 14 DAY SENSOR) MISC USE AS DIRECTED PER MD  . DUPIXENT 300 MG/2ML prefilled syringe INJECT 300 MG SUBCUTANEOUSLY EVERY OTHER WEEK  . EPINEPHrine (AUVI-Q) 0.3 mg/0.3 mL IJ SOAJ injection Inject 0.3 mg into the muscle as directed. For immunotherapy (allergy shots) anaphylactic reactions  . Estradiol 10 MCG TABS vaginal tablet Place vaginally.  . fexofenadine (ALLEGRA) 180 MG tablet Take 180 mg by mouth every morning.    . fluticasone (FLONASE) 50 MCG/ACT nasal spray Place 2 sprays into both nostrils 2 (two) times daily.   . furosemide (LASIX) 20 MG tablet TAKE 1 TABLET(20 MG) BY MOUTH DAILY AS NEEDED FOR FLUID RETENTION OR SWELLING  . hydrocortisone 2.5 % cream APPLY EXTERNALLY TO THE AFFECTED AREA TWICE DAILY AS NEEDED (Patient taking differently: Apply 1 application topically 2 (two) times daily as needed (for skin irritation/rash). )  . levofloxacin (LEVAQUIN) 750 MG tablet Take 1 tablet (750 mg total) by mouth daily.  . metFORMIN (GLUCOPHAGE) 1000 MG tablet TAKE 1 TABLET(1000 MG) BY MOUTH TWICE DAILY  . montelukast (SINGULAIR) 10 MG tablet Take 10 mg by mouth at bedtime.    . Multiple Vitamins-Minerals (MULTIVITAMIN WITH MINERALS) tablet Take 1 tablet by mouth daily.  Marland Kitchen. omeprazole (PRILOSEC) 40 MG capsule Take 40 mg by mouth at bedtime.  . pantoprazole (PROTONIX) 40 MG tablet TAKE 1 TABLET(40 MG) BY MOUTH DAILY  . predniSONE (DELTASONE) 20 MG tablet 2 Tablet's  daily for 5 days .  Marland Kitchen. PRESCRIPTION MEDICATION Allergy Shots   . rosuvastatin (CRESTOR) 5 MG tablet TAKE 1 TABLET(5 MG) BY MOUTH DAILY  . sodium chloride HYPERTONIC 3 % nebulizer solution Take by nebulization as needed for other. Inhale contents of 1 vial via nebulizer twice daily as directed  . SPIRIVA RESPIMAT 1.25 MCG/ACT AERS INHALE 2 PUFFS BY MOUTH EVERY DAY   No facility-administered encounter medications on file as of 11/04/2019.     Review of Systems  Review of Systems  Constitutional: Positive for fatigue. Negative for activity change and fever.  HENT: Negative for sinus pressure, sinus pain and sore throat.   Respiratory: Positive for cough, shortness of breath and wheezing.   Cardiovascular: Negative for chest pain and palpitations.  Gastrointestinal: Negative for diarrhea, nausea and vomiting.  Musculoskeletal: Negative for arthralgias.  Neurological: Negative for dizziness.  Psychiatric/Behavioral: Negative for sleep disturbance. The patient is not nervous/anxious.      Physical Exam  BP 124/82 (BP Location: Left Arm, Cuff Size: Normal)   Pulse 79   Temp (!) 97.2 F (36.2 C) (Oral)   Ht 5\' 4"  (1.626 m)   Wt 186 lb 3.2 oz (84.5 kg)   SpO2 97%   BMI 31.96 kg/m   Wt Readings from Last 5 Encounters:  11/04/19 186 lb 3.2 oz (84.5 kg)  09/12/19 186 lb 12.8 oz (84.7 kg)  09/05/19 188 lb 3.2 oz (85.4 kg)  08/05/19 186 lb 12.8 oz (84.7 kg)  03/13/19 183 lb (83 kg)    BMI Readings from Last 5 Encounters:  11/04/19 31.96 kg/m  09/12/19 32.06 kg/m  09/05/19 32.30 kg/m  08/05/19 32.06 kg/m  03/13/19 31.41 kg/m     Physical Exam Vitals and nursing note reviewed.  Constitutional:      General: She  is not in acute distress.    Appearance: Normal appearance. She is obese.  HENT:     Head: Normocephalic and atraumatic.     Right Ear: Tympanic membrane, ear canal and external ear normal. There is no impacted cerumen.     Left Ear: Tympanic membrane, ear canal  and external ear normal. There is no impacted cerumen.     Nose: Rhinorrhea present. No congestion.     Mouth/Throat:     Mouth: Mucous membranes are moist.     Pharynx: Oropharynx is clear.     Comments: +pnd Eyes:     Pupils: Pupils are equal, round, and reactive to light.  Cardiovascular:     Rate and Rhythm: Normal rate and regular rhythm.     Pulses: Normal pulses.     Heart sounds: Normal heart sounds. No murmur heard.   Pulmonary:     Effort: Pulmonary effort is normal. No respiratory distress.     Breath sounds: No decreased air movement. Wheezing (LUL wheeze when coughing ) present. No decreased breath sounds or rales.  Musculoskeletal:     Cervical back: Normal range of motion.     Right lower leg: No edema.     Left lower leg: No edema.  Skin:    General: Skin is warm and dry.     Capillary Refill: Capillary refill takes less than 2 seconds.  Neurological:     General: No focal deficit present.     Mental Status: She is alert and oriented to person, place, and time. Mental status is at baseline.     Gait: Gait normal.  Psychiatric:        Mood and Affect: Mood normal.        Behavior: Behavior normal.        Thought Content: Thought content normal.        Judgment: Judgment normal.       Assessment & Plan:   Severe persistent asthma Plan: Continue Symbicort 160 Continue Spiriva Respimat 1.25 Continue Dupixent injections 1 week follow-up with our office  Bronchiectasis with acute exacerbation (HCC) October/2019 high-resolution CT chest shows mild cylindrical bronchiectasis Patient reports clinical improvement with therapy vest Patient is status post doxycycline, Levaquin course recently finished on 11/03/2019 Patient reports adherence to hypertonic saline nebs, therapy vest use 2 sessions twice daily Patient reports adherence to Symbicort 160, Spiriva Respimat 1.25 and Mucinex Most recent sputum sample results from May/2021 show no abnormal  sputum  Plan: Chest x-ray today Based off chest x-ray results of potential inflammation will order CT chest to compare to October/2019 Continue hypertonic saline nebs Continue therapy vest Continue Symbicort 160 Continue Spiriva Respimat 1.25 Continue daily probiotic 1 week follow-up with our office Written out of work till 11/10/2019 given symptoms  GERD Known severe GERD Managed by Dr. Ewing Schlein Currently taking 40 mg of Protonix and the morning, 40 mg of omeprazole in the evening Recent chest pain episode that was resolved by taking additional dose of PPI  Plan: Schedule follow-up with Dr. Ewing Schlein  Shortness of breath Chest x-ray today showing potential pneumonitis  Plan: We will further evaluate with high-resolution CT chest  Abnormal findings on diagnostic imaging of lung Chest x-ray showing potential pneumonitis  Plan: We will order high-resolution CT chest to further evaluate    Return in about 1 week (around 11/11/2019), or if symptoms worsen or fail to improve, for Follow up with [name] FNP-C, After Chest CT.   [name], NP 11/04/2019  This appointment required 56 minutes of patient care (this includes precharting, chart review, review of results, face-to-face care, etc.).

## 2019-11-04 NOTE — Assessment & Plan Note (Signed)
Chest x-ray today showing potential pneumonitis  Plan: We will further evaluate with high-resolution CT chest

## 2019-11-05 ENCOUNTER — Other Ambulatory Visit: Payer: BC Managed Care – PPO

## 2019-11-05 DIAGNOSIS — J471 Bronchiectasis with (acute) exacerbation: Secondary | ICD-10-CM

## 2019-11-08 LAB — RESPIRATORY CULTURE OR RESPIRATORY AND SPUTUM CULTURE
MICRO NUMBER:: 11010054
RESULT:: NORMAL
SPECIMEN QUALITY:: ADEQUATE

## 2019-11-12 ENCOUNTER — Ambulatory Visit: Payer: BC Managed Care – PPO | Admitting: Pulmonary Disease

## 2019-11-14 ENCOUNTER — Encounter: Payer: Self-pay | Admitting: Primary Care

## 2019-11-14 ENCOUNTER — Other Ambulatory Visit: Payer: Self-pay

## 2019-11-14 ENCOUNTER — Ambulatory Visit (INDEPENDENT_AMBULATORY_CARE_PROVIDER_SITE_OTHER)
Admission: RE | Admit: 2019-11-14 | Discharge: 2019-11-14 | Disposition: A | Discharge status: Home / Self Care | Payer: BC Managed Care – PPO | Source: Ambulatory Visit | Attending: Pulmonary Disease | Admitting: Pulmonary Disease

## 2019-11-14 ENCOUNTER — Ambulatory Visit: Payer: BC Managed Care – PPO | Admitting: Adult Health

## 2019-11-14 ENCOUNTER — Ambulatory Visit: Payer: BC Managed Care – PPO | Admitting: Pulmonary Disease

## 2019-11-14 ENCOUNTER — Ambulatory Visit: Payer: BC Managed Care – PPO | Admitting: Primary Care

## 2019-11-14 DIAGNOSIS — J4550 Severe persistent asthma, uncomplicated: Secondary | ICD-10-CM | POA: Diagnosis not present

## 2019-11-14 DIAGNOSIS — R918 Other nonspecific abnormal finding of lung field: Secondary | ICD-10-CM

## 2019-11-14 DIAGNOSIS — J471 Bronchiectasis with (acute) exacerbation: Secondary | ICD-10-CM | POA: Diagnosis not present

## 2019-11-14 MED ORDER — LEVOFLOXACIN 750 MG PO TABS: 750.0000 mg | ORAL_TABLET | Freq: Every day | ORAL | 0 refills | Status: DC

## 2019-11-14 NOTE — Patient Instructions (Addendum)
Recommendations: Increased sodium chloride to three times a day x 7-10 days Resume mucinex 1,200mg  twice daily  (with glass of water) x 7-10 days  Continue therapy vest twice daily Extend Levaquin x 3 days  Continue inhaler as prescribed   Follow-up: 3 months with Dr. Isaiah Serge or if not better after above recommendations

## 2019-11-14 NOTE — Progress Notes (Addendum)
@Patient  ID: , female    DOB: 1960-11-29, 59 y.o.   MRN: [id number]  Chief Complaint  Patient presents with  . Follow-up    SOB has improved, coughing up thick yellow mucus, feels a "flutter" in left lung.    Referring provider: [id number], DO  HPI: 59 year old female never smoker, first seen in 2017 for asthma and a pulmonary nodule  She has severe GERD and is followed by Dr. 2018.  She started on immunotherapy in 2017 She was born prematurely at 70 weeks.  Maintenance:  Symbicort 160, Spiriva Resp 1.25, Dupixent  Patient of Dr. 30  Previous [facility] pulmonary encounters: 11/04/2019  - Visit  59 year old female followed in our office for asthma.  She is followed by Dr. 77.  Patient contacted our office on 10/20/2019 reporting that she needed an antibiotic.  She was treated with doxycycline on 10/20/2019, patient reports that she is not comfortable taking the Z-Pak so she was treated with doxycycline by Dr. 10/22/2019.  Patient contacted our office again on 10/24/2019 reporting that she did obtain an outpatient Covid test was waiting for those results she has been taking the doxycycline and symptoms were not improving.  She was given a course of prednisone and given ED precautions.  Patient contacted our office again on 10/27/2019 reporting that she had a negative Covid test and only 1 more day of antibiotics she remains congested, had a cough and mucus patient was then treated with Levaquin for 7 days on 10/27/2019 and 5 more days of prednisone at 40 mg patient contacted our office again on 11/03/2019 reporting that she still is not feeling well.  Patient has had moderate clinical relief after Levaquin and prednisone.  She reports that symptoms though quickly returned after antibiotic use.  She is still reporting discolored sinus drainage.  She is doing nasal saline rinses 1 time a day.  She continues to be adherent to hypertonic saline nebs, 2 chest therapy sessions twice  daily, she remains adherent to Symbicort 160, Spiriva Respimat 1.25 and Dupixent which she received last week.  Chest x-ray obtained today shows potential pneumonitis.  We will discuss and evaluate this today.  Patient reports atypical chest pain on 11/03/2019 when she tried to return back to work.  She works as a 11/05/2019.  This requires a significant mental walking and moving around at school.  She had a episode of chest pain after climbing 28 stairs and having a coughing fit.  She then left work early and drove home.  She felt that she was having worsened acid reflux pain despite taking her pantoprazole 40 mg in the morning and omeprazole 40 mg in the afternoon.  She took an additional dose of omeprazole to help with relieving acid reflux pain.  She also contacted EMS due to this ongoing chest pain.  She reports that the EKG was normal.  Symptoms improved after taking the omeprazole additional dose.  She is followed by Dr. Tax inspector for gastroenterology.  She is unsure when her next follow-up with him is.  She believes she is due for a colonoscopy in spring/2022.  Last sputum cultures obtained were in May/2021 which were normal.   11/14/2019- Interim hx Patient presents today for 2 week follow-up bronchiectasis. She was treated for acute exacerbation of her bronchiectasis in September with course of oral Doxycycline and Levaquin. She is doing some better but not back to baseline. Reports that she is less short of breath but  still coughing up thick yellow mucus. She has been using hypertonic saline twice a day and therapy vest twice daily. CT chest in October showed unchanged mild tubular bronchiectasis bilateral lung and minimal scarring dependent left lung consistent with sequelae prior infection or inflammation. She is due for dupixent injection today. She has an upcoming apt with Dr. Madie RenoVanwinkle.  Imaging: 10/18/2015 chest CT images showing normal pulmonary parenchyma with the exception of a  3.4 mm nodule in the left lower lobe.  November 07, 2017 high-resolution CT scan of the chest images : Mild cylindrical bronchiectasis particularly in the left lower lobe, some in the right lower lobe.  Chest x-ray 06/10/2019-clear lungs  HRCT 11/14/19 - mild tubular bronchiectasis and minimal bandlike scarring left lung base consistent with sequelae of prior infection or inflammation. No evidence of fibrotic interstitial lung disease. Mild lobular air trapping. Stable, benign 4 mm pulmonary nodule of the left lung base.    Pulmonary function test: October 2019 ratio 90%, FVC 2.56 L 76% predicted, total lung capacity 4.32 L 84% predicted, DLCO 20.7 mL 84% predicted  Labs: October 2019 complement levels within normal limits, IgG subclasses all normal, alpha-1 antitrypsin level normal, CBC showed 300 absolute eosinophil count  11/03/2015 - Ige - 32  Heart catheterization: October 2019 right heart catheterization showed mean PA pressure 24, pulmonary capillary wedge pressure 19, PVR 0.7 Woods units   Allergies  Allergen Reactions  . Butorphanol Tartrate Other (See Comments)    REACTION: hallucinations  . Hydrocodone Other (See Comments)    HALLUCINATIONS  . Roxicet [Oxycodone-Acetaminophen] Other (See Comments)    HALLUCINATIONS  . Doxycycline     DOES NOT WORK FOR PATIENT  . Penicillins Hives    Occurred at age 54/CHILDHOOD ALLERGY Has patient had a PCN reaction causing immediate rash, facial/tongue/throat swelling, SOB or lightheadedness with hypotension: Unknown Has patient had a PCN reaction causing severe rash involving mucus membranes or skin necrosis: Unknown Has patient had a PCN reaction that required hospitalization: Unknown Has patient had a PCN reaction occurring within the last 10 years: No If all of the above answers are "NO", then may proceed with Cephalosporin use.     Immunization History  Administered Date(s) Administered  . Influenza Inj Mdck Quad Pf 10/01/2018   . Influenza Split 03/12/2012, 11/12/2013, 11/07/2014  . Influenza Whole 12/08/2009  . Influenza,inj,Quad PF,6+ Mos 12/06/2012, 11/14/2017  . Influenza-Unspecified 11/06/2016, 10/01/2018  . PFIZER SARS-COV-2 Vaccination 05/12/2019, 06/03/2019  . Pneumococcal Conjugate-13 09/24/2015  . Pneumococcal Polysaccharide-23 09/18/2014  . Td 03/07/2007  . Tdap 03/30/2017  . Zoster Recombinat (Shingrix) 10/26/2018, 01/03/2019    Past Medical History:  Diagnosis Date  . Asthma, moderate persistent    sees Dr Irena CordsVan Winkle  . Atrial tachycardia (HCC)   . Chronic diastolic CHF (congestive heart failure) (HCC)   . COVID-19 virus infection 01/2019  . Diabetes mellitus    A1C 6.2---->  2009  . Family history of adverse reaction to anesthesia    sister had cardiac arrest with rhinoplasty  . Fibrocystic breast   . GERD (gastroesophageal reflux disease)   . HH (hiatus hernia) 09/2014   Dr. Ewing SchleinMagod  . History of echocardiogram    Echo 5/19: EF 55-60, normal wall motion, PASP 41  . History of hiatal hernia   . History of nuclear stress test    Nuclear stress test 5/19:  EF 64, normal perfusion, Low risk  . Hot flashes   . Hyperlipidemia   . Premature atrial contractions   .  Pulmonary hypertension (HCC)   . PVC's (premature ventricular contractions)   . Recurrent cystitis    took  postcoital antibiotic for years, symptoms better after her hysterectomy 1996, symptoms re-surface in 2011  . RUQ abdominal pain 2016   EGD showed small HH, otherwise normal  . Wears glasses     Tobacco History: Social History   Tobacco Use  Smoking Status Never Smoker  Smokeless Tobacco Never Used   Counseling given: Not Answered   Outpatient Medications Prior to Visit  Medication Sig Dispense Refill  . albuterol (PROVENTIL HFA;VENTOLIN HFA) 108 (90 BASE) MCG/ACT inhaler Inhale 2 puffs into the lungs every 6 (six) hours as needed.    Marland Kitchen aspirin EC 81 MG tablet Take 81 mg by mouth daily.    . bifidobacterium  infantis (ALIGN) capsule Take 1 capsule by mouth daily.    . budesonide-formoterol (SYMBICORT) 160-4.5 MCG/ACT inhaler Inhale 2 puffs into the lungs 2 (two) times daily.    . Calcium Carbonate-Vitamin D (CALCIUM 600+D) 600-200 MG-UNIT TABS Take 1 tablet by mouth daily.    . Continuous Blood Gluc Receiver (FREESTYLE LIBRE 14 DAY READER) DEVI USE AS DIRECTED 1 each 0  . Continuous Blood Gluc Sensor (FREESTYLE LIBRE 14 DAY SENSOR) MISC USE AS DIRECTED PER MD 2 each 5  . DUPIXENT 300 MG/2ML prefilled syringe INJECT 300 MG SUBCUTANEOUSLY EVERY OTHER WEEK 4 mL 10  . EPINEPHrine (AUVI-Q) 0.3 mg/0.3 mL IJ SOAJ injection Inject 0.3 mg into the muscle as directed. For immunotherapy (allergy shots) anaphylactic reactions    . Estradiol 10 MCG TABS vaginal tablet Place vaginally.    . fexofenadine (ALLEGRA) 180 MG tablet Take 180 mg by mouth every morning.      . fluticasone (FLONASE) 50 MCG/ACT nasal spray Place 2 sprays into both nostrils 2 (two) times daily.     . furosemide (LASIX) 20 MG tablet TAKE 1 TABLET(20 MG) BY MOUTH DAILY AS NEEDED FOR FLUID RETENTION OR SWELLING 30 tablet 8  . hydrocortisone 2.5 % cream APPLY EXTERNALLY TO THE AFFECTED AREA TWICE DAILY AS NEEDED (Patient taking differently: Apply 1 application topically 2 (two) times daily as needed (for skin irritation/rash). ) 30 g 0  . metFORMIN (GLUCOPHAGE) 1000 MG tablet TAKE 1 TABLET(1000 MG) BY MOUTH TWICE DAILY 180 tablet 3  . montelukast (SINGULAIR) 10 MG tablet Take 10 mg by mouth at bedtime.      . Multiple Vitamins-Minerals (MULTIVITAMIN WITH MINERALS) tablet Take 1 tablet by mouth daily.    Marland Kitchen omeprazole (PRILOSEC) 40 MG capsule Take 40 mg by mouth at bedtime.    . pantoprazole (PROTONIX) 40 MG tablet TAKE 1 TABLET(40 MG) BY MOUTH DAILY 90 tablet 3  . PRESCRIPTION MEDICATION Allergy Shots     . rosuvastatin (CRESTOR) 5 MG tablet TAKE 1 TABLET(5 MG) BY MOUTH DAILY 90 tablet 3  . sodium chloride HYPERTONIC 3 % nebulizer solution Take by  nebulization as needed for other. Inhale contents of 1 vial via nebulizer twice daily as directed 750 mL 12  . SPIRIVA RESPIMAT 1.25 MCG/ACT AERS INHALE 2 PUFFS BY MOUTH EVERY DAY 4 g 5  . predniSONE (DELTASONE) 20 MG tablet 2 Tablet's daily for 5 days . (Patient not taking: Reported on 11/14/2019) 10 tablet 0  . levofloxacin (LEVAQUIN) 750 MG tablet Take 1 tablet (750 mg total) by mouth daily. (Patient not taking: Reported on 11/14/2019) 7 tablet 0   No facility-administered medications prior to visit.      Review of Systems  Review of Systems  Constitutional: Negative for fever.  HENT: Positive for congestion and postnasal drip.   Respiratory: Positive for cough.     Physical Exam  BP 132/80 (BP Location: Left Arm, Patient Position: Sitting, Cuff Size: Normal)   Pulse 84   Temp 97.8 F (36.6 C) (Temporal)   Ht 5\' 4"  (1.626 m)   Wt 189 lb (85.7 kg)   SpO2 98%   BMI 32.44 kg/m  Physical Exam Constitutional:      Appearance: Normal appearance.  HENT:     Right Ear: There is no impacted cerumen.     Left Ear: There is no impacted cerumen.  Cardiovascular:     Rate and Rhythm: Normal rate and regular rhythm.  Pulmonary:     Effort: Pulmonary effort is normal.     Breath sounds: Normal breath sounds. No wheezing or rhonchi.     Comments: Reactive cough Neurological:     Mental Status: She is alert.      Lab Results:  CBC    Component Value Date/Time   WBC 4.5 03/04/2019 0801   RBC 4.22 03/04/2019 0801   HGB 11.6 (L) 03/04/2019 0801   HGB 11.8 09/02/2018 0935   HCT 35.0 (L) 03/04/2019 0801   HCT 38.0 09/02/2018 0935   PLT 169.0 03/04/2019 0801   PLT 304 09/02/2018 0935   MCV 83.1 03/04/2019 0801   MCV 81 09/02/2018 0935   MCH 25.0 (L) 09/02/2018 0935   MCH 26.5 11/26/2017 0701   MCHC 33.0 03/04/2019 0801   RDW 17.2 (H) 03/04/2019 0801   RDW 14.1 09/02/2018 0935   LYMPHSABS 1.2 03/04/2019 0801   LYMPHSABS 1.4 09/02/2018 0935   MONOABS 0.4 03/04/2019 0801    EOSABS 0.1 03/04/2019 0801   EOSABS 0.2 09/02/2018 0935   BASOSABS 0.0 03/04/2019 0801   BASOSABS 0.0 09/02/2018 0935    BMET    Component Value Date/Time   NA 139 03/04/2019 0801   K 3.5 03/04/2019 0801   CL 101 03/04/2019 0801   CO2 30 03/04/2019 0801   GLUCOSE 155 (H) 03/04/2019 0801   GLUCOSE 118 08/26/2009 0000   BUN 10 03/04/2019 0801   CREATININE 0.65 03/04/2019 0801   CREATININE 0.93 09/24/2015 1454   CALCIUM 9.4 03/04/2019 0801   GFRNONAA >60 11/26/2017 0701   GFRAA >60 11/26/2017 0701    BNP No results found for: BNP  ProBNP No results found for: PROBNP  Imaging: DG Chest 2 View  Result Date: 11/04/2019 CLINICAL DATA:  Cough.  Bronchiectasis. EXAM: CHEST - 2 VIEW COMPARISON:  06/18/2018. FINDINGS: Mediastinum and hilar structures normal. Heart size normal. Lung volumes. Very mild interstitial prominence cannot be excluded. Mild pneumonitis cannot be excluded. No pleural effusion or pneumothorax. IMPRESSION: Low lung volumes. Very mild interstitial prominence cannot be excluded. Mild pneumonitis cannot be excluded. Electronically Signed   By: 08/18/2018  Register   On: 11/04/2019 09:47   CT Chest High Resolution  Result Date: 11/14/2019 CLINICAL DATA:  Interstitial lung disease, history of bronchiectasis, shortness of breath, recurrent URIs, cough and shortness of breath EXAM: CT CHEST WITHOUT CONTRAST TECHNIQUE: Multidetector CT imaging of the chest was performed following the standard protocol without intravenous contrast. High resolution imaging of the lungs, as well as inspiratory and expiratory imaging, was performed. COMPARISON:  11/07/2017 FINDINGS: Cardiovascular: No significant vascular findings. Normal heart size. No pericardial effusion. Mediastinum/Nodes: No enlarged mediastinal, hilar, or axillary lymph nodes. Thyroid gland, trachea, and esophagus demonstrate no significant findings. Lungs/Pleura: There is  mild, tubular bronchiectasis in the bilateral lung bases.  Minimal bandlike scarring of the dependent left lung base. Stable, benign 4 mm pulmonary nodule of the left lung base (series 4, image 197). Mild lobular air trapping on expiratory phase imaging. No pleural effusion or pneumothorax. Upper Abdomen: No acute abnormality. Musculoskeletal: No chest wall mass or suspicious bone lesions identified. IMPRESSION: 1. There is mild, tubular bronchiectasis in the bilateral lung bases, unchanged compared to prior exam in a. Minimal bandlike scarring of the dependent left lung base. Findings are consistent with sequelae of prior infection or inflammation. 2. No evidence of fibrotic interstitial lung disease. 3. Mild lobular air trapping on expiratory phase imaging, suggestive of small airways disease. 4. Stable, definitively benign 4 mm pulmonary nodule of the left lung base. No routine CT follow-up is required for this nodule. Electronically Signed   By: Lauralyn Primes M.D.   On: 11/14/2019 09:47     Assessment & Plan:   Bronchiectasis with acute exacerbation (HCC) Treated for acute exacerbation on 11/04/19 with Levaquin course, she is feeling some better but not 100% back to baseline. She will likely always have some degree of cough d/t bronchiectasis.   Plan: - Increased hypertonic saline nebulizer to three times a day for 7-10 days - Resume mucinex 1,200mg  twice daily for 7-10 days  - Continue therapy vest TWICE daily - Extend Levaquin course additional 3 days   Follow-up: - 3 months with Dr. Isaiah Serge or if not better after above recommendations    Severe persistent asthma - Continue Symbicort 160 and Spiriva Respimat 1.12mcg  - Continue Dupixent injections    Glenford Bayley, NP 11/19/2019

## 2019-11-19 ENCOUNTER — Encounter: Payer: Self-pay | Admitting: Primary Care

## 2019-11-19 NOTE — Assessment & Plan Note (Addendum)
-   Continue Symbicort 160 and Spiriva Respimat 1.100mcg  - Continue Dupixent injections

## 2019-11-19 NOTE — Assessment & Plan Note (Addendum)
Treated for acute exacerbation on 11/04/19 with Levaquin course, she is feeling some better but not 100% back to baseline. She will likely always have some degree of cough d/t bronchiectasis.   Plan: - Increased hypertonic saline nebulizer to three times a day for 7-10 days - Resume mucinex 1,200mg  twice daily for 7-10 days  - Continue therapy vest TWICE daily - Extend Levaquin course additional 3 days   Follow-up: - 3 months with Dr. Isaiah Serge or if not better after above recommendations

## 2019-12-02 ENCOUNTER — Other Ambulatory Visit: Payer: Self-pay | Admitting: Family Medicine

## 2019-12-02 DIAGNOSIS — E118 Type 2 diabetes mellitus with unspecified complications: Secondary | ICD-10-CM

## 2019-12-02 MED ORDER — FREESTYLE LIBRE 14 DAY SENSOR MISC: 0 refills | Status: DC

## 2019-12-02 NOTE — Telephone Encounter (Signed)
Patient is calling and requesting a refill for [name] 14 Day Sensor sent to [facility] on [address]. Informed patient that Dr. Salena Saner was out of the office today and request would be sent to the provider of the day, please advise. CB is [phone].

## 2019-12-07 ENCOUNTER — Encounter: Payer: Self-pay | Admitting: Family Medicine

## 2019-12-07 DIAGNOSIS — E118 Type 2 diabetes mellitus with unspecified complications: Secondary | ICD-10-CM

## 2019-12-08 MED ORDER — FREESTYLE LIBRE 14 DAY SENSOR MISC: 2 refills | Status: DC

## 2019-12-08 NOTE — Telephone Encounter (Signed)
Last OV 09/05/19 Last fill 12/02/19  #2/0

## 2019-12-09 ENCOUNTER — Telehealth: Payer: Self-pay | Admitting: *Deleted

## 2019-12-09 NOTE — Telephone Encounter (Signed)
   [facility] Pre-operative Risk Assessment    HEARTCARE STAFF: - Please ensure there is not already an duplicate clearance open for this procedure. - Under Visit Info/Reason for Call, type in Other and utilize the format Clearance MM/DD/YY or Clearance TBD. Do not use dashes or single digits. - If request is for dental extraction, please clarify the # of teeth to be extracted.  Request for surgical clearance:  1. What type of surgery is being performed? RIGHT KNEE SCOPE   2. When is this surgery scheduled? 12/26/19   3. What type of clearance is required (medical clearance vs. Pharmacy clearance to hold med vs. Both)? MEDICAL  4. Are there any medications that need to be held prior to surgery and how long? ASA    5. Practice name and name of physician performing surgery? [facility]; DR. Elsie Saas   6. What is the office phone number? [phone]   7.   What is the office fax number? [place].   Anesthesia type (None, local, MAC, general) ? FEMORAL NERVE BLOCK   Julaine Hua 12/09/2019, 4:55 PM  _________________________________________________________________   (provider comments below)

## 2019-12-11 NOTE — Telephone Encounter (Signed)
Callback pool to update upcoming visit with Dr. Delton See to include preop clearance. Will defer final clearance to MD.

## 2019-12-12 NOTE — Telephone Encounter (Signed)
Pt have appt with Dr Delton See on 11/10 @ 3:40 pm with Dr Delton See

## 2019-12-17 ENCOUNTER — Ambulatory Visit: Payer: BC Managed Care – PPO | Admitting: Cardiology

## 2019-12-17 ENCOUNTER — Encounter: Payer: Self-pay | Admitting: Cardiology

## 2019-12-17 ENCOUNTER — Other Ambulatory Visit: Payer: Self-pay

## 2019-12-17 VITALS — BP 122/66 | HR 81 | Ht 64.0 in | Wt 191.4 lb

## 2019-12-17 DIAGNOSIS — I5033 Acute on chronic diastolic (congestive) heart failure: Secondary | ICD-10-CM

## 2019-12-17 DIAGNOSIS — R002 Palpitations: Secondary | ICD-10-CM

## 2019-12-17 DIAGNOSIS — E785 Hyperlipidemia, unspecified: Secondary | ICD-10-CM

## 2019-12-17 DIAGNOSIS — R600 Localized edema: Secondary | ICD-10-CM

## 2019-12-17 DIAGNOSIS — U099 Post covid-19 condition, unspecified: Secondary | ICD-10-CM | POA: Diagnosis not present

## 2019-12-17 DIAGNOSIS — I272 Pulmonary hypertension, unspecified: Secondary | ICD-10-CM | POA: Diagnosis not present

## 2019-12-17 DIAGNOSIS — Z01810 Encounter for preprocedural cardiovascular examination: Secondary | ICD-10-CM

## 2019-12-17 MED ORDER — SPIRONOLACTONE 25 MG PO TABS: 12.5000 mg | ORAL_TABLET | Freq: Every day | ORAL | 3 refills | Status: DC

## 2019-12-17 NOTE — Patient Instructions (Signed)
Medication Instructions:   START TAKING SPIRONOLACTONE 12.5 MG BY MOUTH DAILY  *If you need a refill on your cardiac medications before your next appointment, please call your pharmacy*   Testing/Procedures:  Your physician has requested that you have an echocardiogram. Echocardiography is a painless test that uses sound waves to create images of your heart. It provides your doctor with information about the size and shape of your heart and how well your heart's chambers and valves are working. This procedure takes approximately one hour. There are no restrictions for this procedure.  Follow-Up: At [facility], you and your health needs are our priority.  As part of our continuing mission to provide you with exceptional heart care, we have created designated Provider Care Teams.  These Care Teams include your primary Cardiologist (physician) and Advanced Practice Providers (APPs -  Physician Assistants and Nurse Practitioners) who all work together to provide you with the care you need, when you need it.  We recommend signing up for the patient portal called "MyChart".  Sign up information is provided on this After Visit Summary.  MyChart is used to connect with patients for Virtual Visits (Telemedicine).  Patients are able to view lab/test results, encounter notes, upcoming appointments, etc.  Non-urgent messages can be sent to your provider as well.   To learn more about what you can do with MyChart, go to [email or web address].    Your next appointment:   6 month(s)  The format for your next appointment:   In Person  Provider:   Tobias Alexander, MD

## 2019-12-17 NOTE — Progress Notes (Signed)
Cardiology Office Note:    Date:  12/17/2019   ID:  Karen Barnes, DOB May 17, 1960, MRN [id number]  PCP:  Overton Mam, DO  [facility] Cardiologist:  Tobias Alexander, MD  [facility] Electrophysiologist:  None   Referring MD: Overton Mam, DO   Reason for visit: Preoperative evaluation  History of Present Illness:    Karen Barnes is a 59 y.o. female with a hx of diabetes, hypertension, hyperlipidemia, palpitations, prior subdural hematoma (2/2 to a mechanical fall in 01/2017), asthma, strong family hx of CAD and AFib.  She has not been able to tolerate beta-blockers in the past due to asthma, but her PVCs have been controlled on Diltiazem but later discontinued.  30-day event monitor from March 2019 that showed PACs PVCs and episodes of atrial tachycardia.   She underwent right-sided cardiac catheterization in October 2019 that showed no evidence of pulmonary arterial hypertension and only mild pulmonary hypertension most probably secondary to diastolic heart failure.   She has been dealing with bad asthma that is now well controlled with Dupixent injections  She had Covid infection in December 2020 and most recently pneumonitis for which he underwent multiple courses of antibiotics. She is coming after a year, she also had a bed knee injury for which she is scheduled for meniscal repair with Dr. Wyline Mood on December 26, 2019.  She denies any chest pain or shortness of breath, she is able to walk 10,000 steps a day without difficulties Arida knee pain.  She has mild lower extremity edema bilaterally but no orthopnea or proximal nocturnal dyspnea.  No recent palpitation dizziness or syncope.  Past Medical History:  Diagnosis Date  . Asthma, moderate persistent    sees Dr Irena Cords  . Atrial tachycardia (HCC)   . Chronic diastolic CHF (congestive heart failure) (HCC)   . COVID-19 virus infection 01/2019  . Diabetes mellitus    A1C 6.2---->  2009  . Family history  of adverse reaction to anesthesia    sister had cardiac arrest with rhinoplasty  . Fibrocystic breast   . GERD (gastroesophageal reflux disease)   . HH (hiatus hernia) 09/2014   Dr. Ewing Schlein  . History of echocardiogram    Echo 5/19: EF 55-60, normal wall motion, PASP 41  . History of hiatal hernia   . History of nuclear stress test    Nuclear stress test 5/19:  EF 64, normal perfusion, Low risk  . Hot flashes   . Hyperlipidemia   . Premature atrial contractions   . Pulmonary hypertension (HCC)   . PVC's (premature ventricular contractions)   . Recurrent cystitis    took  postcoital antibiotic for years, symptoms better after her hysterectomy 1996, symptoms re-surface in 2011  . RUQ abdominal pain 2016   EGD showed small HH, otherwise normal  . Wears glasses     Past Surgical History:  Procedure Laterality Date  . ABDOMINAL HYSTERECTOMY  1996   no oophorectomy, d/tendometriosis and fibroids approximately 1996  . BREAST EXCISIONAL BIOPSY Right 03/12/2014   [facility]  . BREAST LUMPECTOMY WITH RADIOACTIVE SEED LOCALIZATION Right 03/12/2014   Procedure: BREAST LUMPECTOMY WITH RADIOACTIVE SEED LOCALIZATION;  Surgeon: Harriette Bouillon, MD;  Location: [facility];  Service: General;  Laterality: Right;  . BREAST SURGERY     Lumpectomy-- BENIGN   . CATARACT EXTRACTION Bilateral 2011  . CESAREAN SECTION    . COSMETIC SURGERY  01/2017   MonaLisa Touch w/ GYN  . DILATION AND CURETTAGE  OF UTERUS    . LIPOMA EXCISION  1978  . RIGHT HEART CATH N/A 11/26/2017   Procedure: RIGHT HEART CATH;  Surgeon: Laurey Morale, MD;  Location: [facility] INVASIVE CV LAB;  Service: Cardiovascular;  Laterality: N/A;  . UPPER GASTROINTESTINAL ENDOSCOPY    . UPPER GI ENDOSCOPY  09/11/2014   Small Hiatus Hernia, Dr. Ewing Schlein    Current Medications: Current Meds  Medication Sig  . albuterol (PROVENTIL HFA;VENTOLIN HFA) 108 (90 BASE) MCG/ACT inhaler Inhale 2 puffs into the lungs every 6 (six) hours as needed.  Marland Kitchen  aspirin EC 81 MG tablet Take 81 mg by mouth daily.  . bifidobacterium infantis (ALIGN) capsule Take 1 capsule by mouth daily.  . budesonide-formoterol (SYMBICORT) 160-4.5 MCG/ACT inhaler Inhale 2 puffs into the lungs 2 (two) times daily.  . Calcium Carbonate-Vitamin D (CALCIUM 600+D) 600-200 MG-UNIT TABS Take 1 tablet by mouth daily.  . Continuous Blood Gluc Receiver (FREESTYLE LIBRE 14 DAY READER) DEVI USE AS DIRECTED  . Continuous Blood Gluc Sensor (FREESTYLE LIBRE 14 DAY SENSOR) MISC USE AS DIRECTED PER MD  . DUPIXENT 300 MG/2ML prefilled syringe INJECT 300 MG SUBCUTANEOUSLY EVERY OTHER WEEK  . EPINEPHrine (AUVI-Q) 0.3 mg/0.3 mL IJ SOAJ injection Inject 0.3 mg into the muscle as directed. For immunotherapy (allergy shots) anaphylactic reactions  . Estradiol 10 MCG TABS vaginal tablet Place vaginally.  . fexofenadine (ALLEGRA) 180 MG tablet Take 180 mg by mouth every morning.    . fluticasone (FLONASE) 50 MCG/ACT nasal spray Place 2 sprays into both nostrils 2 (two) times daily.   . furosemide (LASIX) 20 MG tablet TAKE 1 TABLET(20 MG) BY MOUTH DAILY AS NEEDED FOR FLUID RETENTION OR SWELLING  . hydrocortisone 2.5 % cream APPLY EXTERNALLY TO THE AFFECTED AREA TWICE DAILY AS NEEDED (Patient taking differently: Apply 1 application topically 2 (two) times daily as needed (for skin irritation/rash). )  . levofloxacin (LEVAQUIN) 750 MG tablet Take 1 tablet (750 mg total) by mouth daily.  . metFORMIN (GLUCOPHAGE) 1000 MG tablet TAKE 1 TABLET(1000 MG) BY MOUTH TWICE DAILY  . montelukast (SINGULAIR) 10 MG tablet Take 10 mg by mouth at bedtime.    . Multiple Vitamins-Minerals (MULTIVITAMIN WITH MINERALS) tablet Take 1 tablet by mouth daily.  . pantoprazole (PROTONIX) 40 MG tablet TAKE 1 TABLET(40 MG) BY MOUTH DAILY  . predniSONE (DELTASONE) 20 MG tablet 2 Tablet's daily for 5 days .  . rosuvastatin (CRESTOR) 5 MG tablet TAKE 1 TABLET(5 MG) BY MOUTH DAILY  . sodium chloride HYPERTONIC 3 % nebulizer  solution Take by nebulization as needed for other. Inhale contents of 1 vial via nebulizer twice daily as directed  . SPIRIVA RESPIMAT 1.25 MCG/ACT AERS INHALE 2 PUFFS BY MOUTH EVERY DAY     Allergies:   Butorphanol tartrate, Hydrocodone, Roxicet [oxycodone-acetaminophen], Doxycycline, and Penicillins   Social History   Socioeconomic History  . Marital status: Married    Spouse name: Not on file  . Number of children: 1  . Years of education: College  . Highest education level: Not on file  Occupational History  . Occupation: retired principal  Tobacco Use  . Smoking status: Never Smoker  . Smokeless tobacco: Never Used  Vaping Use  . Vaping Use: Never used  Substance and Sexual Activity  . Alcohol use: No  . Drug use: No  . Sexual activity: Yes  Other Topics Concern  . Not on file  Social History Narrative   Household-- pt and husband    Rare caffeine  use   Daughter independent, 1 G-child   Engineer, structuralCaregiver for F and M, both w/ dementia   Social Determinants of Health   Financial Resource Strain:   . Difficulty of Paying Living Expenses: Not on file  Food Insecurity:   . Worried About Programme researcher, broadcasting/film/videounning Out of Food in the Last Year: Not on file  . Ran Out of Food in the Last Year: Not on file  Transportation Needs:   . Lack of Transportation (Medical): Not on file  . Lack of Transportation (Non-Medical): Not on file  Physical Activity:   . Days of Exercise per Week: Not on file  . Minutes of Exercise per Session: Not on file  Stress:   . Feeling of Stress : Not on file  Social Connections:   . Frequency of Communication with Friends and Family: Not on file  . Frequency of Social Gatherings with Friends and Family: Not on file  . Attends Religious Services: Not on file  . Active Member of Clubs or Organizations: Not on file  . Attends BankerClub or Organization Meetings: Not on file  . Marital Status: Not on file     Family History: The patient's family history includes Brain cancer  in an other family member; Breast cancer (age of onset: 4460) in her maternal aunt; Breast cancer (age of onset: 6863) in her mother; CAD (age of onset: 3540) in her paternal grandfather and paternal grandmother; Dementia in her mother; Diabetes in her father; Heart disease (age of onset: 8640) in her father; Kidney failure in her father; Lung cancer in an other family member; Mitral valve prolapse in her mother; Sleep apnea in her father. There is no history of Colon cancer.  ROS:   Please see the history of present illness.    All other systems reviewed and are negative.  EKGs/Labs/Other Studies Reviewed:    The following studies were reviewed today:  EKG:  EKG is ordered today.  The ekg ordered today demonstrates normal sinus rhythm 84 bpm, normal EKG unchanged from prior, this was personally reviewed.  Recent Labs: 03/04/2019: ALT 14; BUN 10; Creatinine, Ser 0.65; Hemoglobin 11.6; Platelets 169.0; Potassium 3.5; Sodium 139 09/12/2019: TSH 1.65  Recent Lipid Panel    Component Value Date/Time   CHOL 155 03/04/2019 0801   TRIG 76.0 03/04/2019 0801   HDL 68.40 03/04/2019 0801   CHOLHDL 2 03/04/2019 0801   VLDL 15.2 03/04/2019 0801   LDLCALC 71 03/04/2019 0801    Physical Exam:    VS:  BP 122/66   Pulse 81   Ht 5\' 4"  (1.626 m)   Wt 191 lb 6.4 oz (86.8 kg)   SpO2 97%   BMI 32.85 kg/m     Wt Readings from Last 3 Encounters:  12/17/19 191 lb 6.4 oz (86.8 kg)  11/14/19 189 lb (85.7 kg)  11/04/19 186 lb 3.2 oz (84.5 kg)     GEN:  Well nourished, well developed in no acute distress HEENT: Normal NECK: No JVD; No carotid bruits LYMPHATICS: No lymphadenopathy CARDIAC: RRR, no murmurs, rubs, gallops RESPIRATORY:  Clear to auscultation without rales, wheezing or rhonchi  ABDOMEN: Soft, non-tender, non-distended MUSCULOSKELETAL: Mild bilateral lower extremity edema up to the knees; No deformity  SKIN: Warm and dry NEUROLOGIC:  Alert and oriented x 3 PSYCHIATRIC:  Normal affect    ASSESSMENT:    1. Pulmonary HTN (HCC)   2. Post covid-19 condition, unspecified   3. Palpitations   4. Hyperlipidemia, unspecified hyperlipidemia type   5. Lower  extremity edema   6. Acute on chronic diastolic CHF (congestive heart failure), NYHA class 3 (HCC)   7. Preoperative cardiovascular examination    PLAN:    In order of problems listed above:  Commonly hypertension -pulmonary venous hypertension secondary to diastolic dysfunction, in the settings of recent pneumonitis we will repeat an echocardiogram to reevaluate.  Lower extremity edema -most probably secondary to diastolic dysfunction, I will start spironolactone 12.5 mg p.o. daily, continue Lasix 20 mg daily  Hyperlipidemia -continue low-dose rosuvastatin 5 mg daily, she recently had noncontrast chest CTA that has no evidence for coronary calcification and that is very reassuring.  We will continue the low-dose of rosuvastatin.  Preoperative cardiovascular evaluation, the patient has no symptoms of angina, she has no known severe valvular abnormality, she has mild lower extremity edema that we can control with diuretics but otherwise well compensated and has no contraindication to undergo meniscal repair.   Shared Decision Making/Informed Consent    Medication Adjustments/Labs and Tests Ordered: Current medicines are reviewed at length with the patient today.  Concerns regarding medicines are outlined above.  Orders Placed This Encounter  Procedures  . EKG 12-Lead  . ECHOCARDIOGRAM COMPLETE   Meds ordered this encounter  Medications  . spironolactone (ALDACTONE) 25 MG tablet    Sig: Take 0.5 tablets (12.5 mg total) by mouth daily.    Dispense:  45 tablet    Refill:  3    Patient Instructions  Medication Instructions:   START TAKING SPIRONOLACTONE 12.5 MG BY MOUTH DAILY  *If you need a refill on your cardiac medications before your next appointment, please call your pharmacy*   Testing/Procedures:  Your  physician has requested that you have an echocardiogram. Echocardiography is a painless test that uses sound waves to create images of your heart. It provides your doctor with information about the size and shape of your heart and how well your heart's chambers and valves are working. This procedure takes approximately one hour. There are no restrictions for this procedure.  Follow-Up: At [facility], you and your health needs are our priority.  As part of our continuing mission to provide you with exceptional heart care, we have created designated Provider Care Teams.  These Care Teams include your primary Cardiologist (physician) and Advanced Practice Providers (APPs -  Physician Assistants and Nurse Practitioners) who all work together to provide you with the care you need, when you need it.  We recommend signing up for the patient portal called "MyChart".  Sign up information is provided on this After Visit Summary.  MyChart is used to connect with patients for Virtual Visits (Telemedicine).  Patients are able to view lab/test results, encounter notes, upcoming appointments, etc.  Non-urgent messages can be sent to your provider as well.   To learn more about what you can do with MyChart, go to [email or web address].    Your next appointment:   6 month(s)  The format for your next appointment:   In Person  Provider:   Tobias Alexander, MD        Signed, Tobias Alexander, MD  12/17/2019 4:38 PM    [facility]

## 2019-12-17 NOTE — Telephone Encounter (Signed)
To whom it may concern,  Ms. Toneka Fullen is under my care, presented for preoperative cardiovascular evaluation today, the patient has no symptoms of angina, she has no known severe valvular abnormality, she has mild lower extremity edema that we can control with diuretics but otherwise well compensated and has no contraindication to undergo meniscal repair. She can hold her aspirin 1 week prior to surgery and restart as soon as acceptable from surgical standpoint.  Please call us with any questions.  Regards,   Tobias Alexander, MD

## 2019-12-18 NOTE — Telephone Encounter (Signed)
   Primary Cardiologist: Tobias Alexander, MD  Chart reviewed as part of pre-operative protocol coverage. As documented below by Dr. Delton See, Karen Barnes would be at acceptable risk for the planned procedure without further cardiovascular testing.   Patient can hold aspirin 1 week prior to her upcoming surgery and should restart aspirin when cleared to do so by her surgery.  I will route this recommendation to the requesting party via Epic fax function and remove from pre-op pool.  Please call with questions.  Beatriz Stallion, PA-C 12/18/2019, 9:03 AM

## 2019-12-25 ENCOUNTER — Telehealth: Payer: Self-pay | Admitting: Primary Care

## 2019-12-25 ENCOUNTER — Telehealth: Payer: Self-pay | Admitting: Pulmonary Disease

## 2019-12-25 NOTE — Telephone Encounter (Signed)
Called and spoke with pt letting her know the info stated by [facility] and that we were going to fax surgical clearance to [facility] for her. Pt verbalized understanding. Clearance has been faxed. Nothing further needed

## 2019-12-25 NOTE — Telephone Encounter (Signed)
Patient LOV was 11/14/19, with Waynetta Sandy, NP.  Surgical consent given to [facility], NP. Irving Burton, CMA is aware of surgical consent.  Will route message to [employer], CMA to follow

## 2019-12-25 NOTE — Telephone Encounter (Signed)
Reviewed scan.  Happy to hear it is working well for the patient.  I do not see any particular issues with the therapy vest.  We will continue to encourage patient to utilize vest therapy.   [name], FNP

## 2019-12-25 NOTE — Telephone Encounter (Signed)
Please see duplicate phone note dated as 12/25/2019 .  Spoke to patient and verified that nothing further is needed from our office. Will close encounter.

## 2019-12-25 NOTE — Telephone Encounter (Signed)
I called patient and LM. I am ok to clear her for surgery as long as she is doing well. I saw her in October for bronchiectasis exacerbation, she was doing better but we extended her Levaquin course an additional three days.   She should continue to use Symbicort/Spiriva as prescribed, dupixent as proscribed, Hypertonic saline neb twice daily and therapy vest. If she has any acute symptoms she would need a visit with [place] before knee surgery

## 2019-12-25 NOTE — Telephone Encounter (Signed)
Misty Stanley, please advise if this is in Dr. Shirlee More look ats. Thanks!

## 2019-12-25 NOTE — Telephone Encounter (Signed)
Fax was received by me this week in Dr. Shirlee More box on A pod.  Dr. Isaiah Serge will not be in office until 12/30/19.

## 2020-01-05 ENCOUNTER — Telehealth: Payer: Self-pay | Admitting: Pulmonary Disease

## 2020-01-06 ENCOUNTER — Other Ambulatory Visit: Payer: Self-pay | Admitting: Cardiology

## 2020-01-06 NOTE — Telephone Encounter (Signed)
Submitted a Prior Authorization request to [facility] for DUPIXENT via Fax. Will update once we receive a response.   PA# [id number]  Phone# [phone] Fax#[phone]

## 2020-01-07 NOTE — Telephone Encounter (Signed)
rx refill

## 2020-01-07 NOTE — Telephone Encounter (Signed)
Received notification from [facility] regarding a prior authorization for DUPIXENT. Authorization has been APPROVED from 01/06/20 to 01/05/21.   Authorization # [id number]   Called patient and advised.

## 2020-01-08 ENCOUNTER — Other Ambulatory Visit (HOSPITAL_COMMUNITY): Payer: BC Managed Care – PPO

## 2020-01-08 ENCOUNTER — Encounter (HOSPITAL_COMMUNITY): Payer: Self-pay

## 2020-01-09 ENCOUNTER — Encounter (HOSPITAL_COMMUNITY): Payer: Self-pay | Admitting: Cardiology

## 2020-01-12 ENCOUNTER — Other Ambulatory Visit: Payer: Self-pay | Admitting: Primary Care

## 2020-01-29 ENCOUNTER — Other Ambulatory Visit: Payer: Self-pay

## 2020-01-29 ENCOUNTER — Ambulatory Visit (HOSPITAL_COMMUNITY): Payer: BC Managed Care – PPO | Attending: Internal Medicine

## 2020-01-29 DIAGNOSIS — I272 Pulmonary hypertension, unspecified: Secondary | ICD-10-CM | POA: Diagnosis present

## 2020-01-29 DIAGNOSIS — U099 Post covid-19 condition, unspecified: Secondary | ICD-10-CM | POA: Insufficient documentation

## 2020-01-29 LAB — ECHOCARDIOGRAM COMPLETE
Area-P 1/2: 2.76 cm2
S' Lateral: 2.1 cm

## 2020-01-29 MED ORDER — PERFLUTREN LIPID MICROSPHERE
1.0000 mL | INTRAVENOUS | Status: AC | PRN
  Administered 2020-01-29: 2 mL via INTRAVENOUS

## 2020-02-02 NOTE — Progress Notes (Signed)
Patient returned my call. I made her aware of results of her ECHO per Dr Delton See and patient informed me that she also reviewed her results in MyChart. She verbalized her appreciation for my call.

## 2020-02-02 NOTE — Progress Notes (Signed)
LMOM for patient to call the office. 

## 2020-02-03 ENCOUNTER — Telehealth: Payer: Self-pay | Admitting: Family Medicine

## 2020-02-03 NOTE — Telephone Encounter (Signed)
ERROR

## 2020-02-17 ENCOUNTER — Encounter: Payer: Self-pay | Admitting: Family Medicine

## 2020-03-03 ENCOUNTER — Other Ambulatory Visit: Payer: Self-pay | Admitting: Cardiology

## 2020-03-03 ENCOUNTER — Other Ambulatory Visit: Payer: Self-pay | Admitting: Family Medicine

## 2020-03-03 DIAGNOSIS — E118 Type 2 diabetes mellitus with unspecified complications: Secondary | ICD-10-CM

## 2020-03-03 DIAGNOSIS — E785 Hyperlipidemia, unspecified: Secondary | ICD-10-CM

## 2020-03-03 DIAGNOSIS — I10 Essential (primary) hypertension: Secondary | ICD-10-CM

## 2020-03-03 DIAGNOSIS — R002 Palpitations: Secondary | ICD-10-CM

## 2020-03-03 DIAGNOSIS — I5032 Chronic diastolic (congestive) heart failure: Secondary | ICD-10-CM

## 2020-03-10 ENCOUNTER — Ambulatory Visit: Payer: BC Managed Care – PPO | Admitting: Family Medicine

## 2020-03-10 ENCOUNTER — Other Ambulatory Visit: Payer: Self-pay

## 2020-03-11 ENCOUNTER — Encounter: Payer: Self-pay | Admitting: Family Medicine

## 2020-03-11 ENCOUNTER — Ambulatory Visit: Payer: BC Managed Care – PPO | Admitting: Family Medicine

## 2020-03-11 VITALS — BP 132/80 | HR 76 | Temp 97.5°F | Ht 64.0 in | Wt 191.4 lb

## 2020-03-11 DIAGNOSIS — J479 Bronchiectasis, uncomplicated: Secondary | ICD-10-CM | POA: Diagnosis not present

## 2020-03-11 DIAGNOSIS — J4550 Severe persistent asthma, uncomplicated: Secondary | ICD-10-CM

## 2020-03-11 DIAGNOSIS — E785 Hyperlipidemia, unspecified: Secondary | ICD-10-CM | POA: Diagnosis not present

## 2020-03-11 DIAGNOSIS — E118 Type 2 diabetes mellitus with unspecified complications: Secondary | ICD-10-CM

## 2020-03-11 MED ORDER — FREESTYLE LIBRE 14 DAY SENSOR MISC: 3 refills | Status: DC

## 2020-03-11 MED ORDER — ALBUTEROL SULFATE HFA 108 (90 BASE) MCG/ACT IN AERS: 2.0000 | INHALATION_SPRAY | Freq: Four times a day (QID) | RESPIRATORY_TRACT | 2 refills | Status: DC | PRN

## 2020-03-11 MED ORDER — FREESTYLE LIBRE 14 DAY SENSOR MISC
3 refills | Status: DC
Start: 2020-03-11 — End: 2020-12-07

## 2020-03-11 NOTE — Progress Notes (Signed)
Chief Complaint  Patient presents with  . Follow-up    6 month f/u and med refills.  Average BS being 175.     HPI: *Karen Barnes is a 60 y.o. female here for DM, HLD follow-up. For DM, pt takes metformin 1000mg  BID. She HLD, pt takes crestor 5mg  daily.   Pt does check BS at home. Readings: average BS is 175 over the past 90 days, uses Libre CBG monitor Hypoglycemia/Hypergylcemic episodes: no  Diet: started Weight Watchers - cutting back on sugar, drinking more water Exercise: no regular CV exercise, was previously walking 3 miles per day at [facility] track.  Lab Results  Component Value Date   HGBA1C 7.6 (H) 09/05/2019   Lab Results  Component Value Date   MICROALBUR <0.7 09/05/2019   Lab Results  Component Value Date   CREATININE 0.65 03/04/2019   Lab Results  Component Value Date   CHOL 155 03/04/2019   HDL 68.40 03/04/2019   LDLCALC 71 03/04/2019   TRIG 76.0 03/04/2019   CHOLHDL 2 03/04/2019    The 10-year ASCVD risk score 03/06/2019 DC Jr., et al., 2013) is: 7.2%   Values used to calculate the score:     Age: 10 years     Sex: Female     Is Non-Hispanic African American: No     Diabetic: Yes     Tobacco smoker: No     Systolic Blood Pressure: 132 mmHg     Is BP treated: Yes     HDL Cholesterol: 68.4 mg/dL     Total Cholesterol: 155 mg/dL  She follows regularly with pulmonary and cardio - asthma, CHF, pulmonary HTN.  Past Medical History:  Diagnosis Date  . Asthma, moderate persistent    sees Dr 2014  . Atrial tachycardia (HCC)   . Chronic diastolic CHF (congestive heart failure) (HCC)   . COVID-19 virus infection 01/2019  . Diabetes mellitus    A1C 6.2---->  2009  . Family history of adverse reaction to anesthesia    sister had cardiac arrest with rhinoplasty  . Fibrocystic breast   . GERD (gastroesophageal reflux disease)   . HH (hiatus hernia) 09/2014   Dr. 2010  . History of echocardiogram    Echo 5/19: EF 55-60, normal wall motion,  PASP 41  . History of hiatal hernia   . History of nuclear stress test    Nuclear stress test 5/19:  EF 64, normal perfusion, Low risk  . Hot flashes   . Hyperlipidemia   . Premature atrial contractions   . Pulmonary hypertension (HCC)   . PVC's (premature ventricular contractions)   . Recurrent cystitis    took  postcoital antibiotic for years, symptoms better after her hysterectomy 1996, symptoms re-surface in 2011  . RUQ abdominal pain 2016   EGD showed small HH, otherwise normal  . Wears glasses     Past Surgical History:  Procedure Laterality Date  . ABDOMINAL HYSTERECTOMY  1996   no oophorectomy, d/tendometriosis and fibroids approximately 1996  . BREAST EXCISIONAL BIOPSY Right 03/12/2014   [facility]  . BREAST LUMPECTOMY WITH RADIOACTIVE SEED LOCALIZATION Right 03/12/2014   Procedure: BREAST LUMPECTOMY WITH RADIOACTIVE SEED LOCALIZATION;  Surgeon: 05/11/2014, MD;  Location: [facility];  Service: General;  Laterality: Right;  . BREAST SURGERY     Lumpectomy-- BENIGN   . CATARACT EXTRACTION Bilateral 2011  . CESAREAN SECTION    . COSMETIC SURGERY  01/2017   [facility]isa Touch w/  GYN  . DILATION AND CURETTAGE OF UTERUS    . LIPOMA EXCISION  1978  . RIGHT HEART CATH N/A 11/26/2017   Procedure: RIGHT HEART CATH;  Surgeon: Laurey Morale, MD;  Location: [facility] INVASIVE CV LAB;  Service: Cardiovascular;  Laterality: N/A;  . UPPER GASTROINTESTINAL ENDOSCOPY    . UPPER GI ENDOSCOPY  09/11/2014   Small Hiatus Hernia, Dr. Ewing Schlein    Social History   Socioeconomic History  . Marital status: Married    Spouse name: Not on file  . Number of children: 1  . Years of education: College  . Highest education level: Not on file  Occupational History  . Occupation: retired principal  Tobacco Use  . Smoking status: Never Smoker  . Smokeless tobacco: Never Used  Vaping Use  . Vaping Use: Never used  Substance and Sexual Activity  . Alcohol use: No  . Drug use: No  . Sexual  activity: Yes  Other Topics Concern  . Not on file  Social History Narrative   Household-- pt and husband    Rare caffeine use   Daughter independent, 1 G-child   Engineer, structural for F and M, both w/ dementia   Social Determinants of Health   Financial Resource Strain: Not on file  Food Insecurity: Not on file  Transportation Needs: Not on file  Physical Activity: Not on file  Stress: Not on file  Social Connections: Not on file  Intimate Partner Violence: Not on file    Family History  Problem Relation Age of Onset  . Mitral valve prolapse Mother   . Breast cancer Mother 17       M had ca insitu (age 66), aunt   . Dementia Mother   . Heart disease Father 46       early   . Diabetes Father   . Kidney failure Father   . Sleep apnea Father   . Brain cancer Other        2 fam members   . Lung cancer Other        uncle, heavy smoker  . Breast cancer Maternal Aunt 60  . CAD Paternal Grandmother 98  . CAD Paternal Grandfather 53  . Colon cancer Neg Hx      Immunization History  Administered Date(s) Administered  . Influenza Inj Mdck Quad Pf 10/01/2018  . Influenza Split 03/12/2012, 11/12/2013, 11/07/2014  . Influenza Whole 12/08/2009  . Influenza,inj,Quad PF,6+ Mos 12/06/2012, 11/14/2017  . Influenza-Unspecified 11/06/2016, 10/01/2018  . PFIZER(Purple Top)SARS-COV-2 Vaccination 05/12/2019, 06/03/2019  . Pneumococcal Conjugate-13 09/24/2015  . Pneumococcal Polysaccharide-23 09/18/2014  . Td 03/07/2007  . Tdap 03/30/2017  . Zoster Recombinat (Shingrix) 10/26/2018, 01/03/2019    Outpatient Encounter Medications as of 03/11/2020  Medication Sig Note  . aspirin EC 81 MG tablet Take 81 mg by mouth daily.   . budesonide-formoterol (SYMBICORT) 160-4.5 MCG/ACT inhaler Inhale 2 puffs into the lungs 2 (two) times daily.   . Calcium Carbonate-Vitamin D 600-200 MG-UNIT TABS Take 1 tablet by mouth daily.   . celecoxib (CELEBREX) 200 MG capsule Take 200 mg by mouth daily.   .  DUPIXENT 300 MG/2ML prefilled syringe INJECT 300 MG SUBCUTANEOUSLY EVERY OTHER WEEK   . EPINEPHrine 0.3 mg/0.3 mL IJ SOAJ injection Inject 0.3 mg into the muscle as directed. For immunotherapy (allergy shots) anaphylactic reactions 08/20/2018: AS NEEDED ONLY   . Estradiol 10 MCG TABS vaginal tablet Place vaginally.   . fluticasone (FLONASE) 50 MCG/ACT nasal spray Place 2 sprays into  both nostrils 2 (two) times daily.    . furosemide (LASIX) 20 MG tablet TAKE 1 TABLET(20 MG) BY MOUTH DAILY AS NEEDED FOR FLUID RETENTION OR SWELLING   . lactobacillus acidophilus (BACID) TABS tablet Take 2 tablets by mouth 3 (three) times daily.   . metFORMIN (GLUCOPHAGE) 1000 MG tablet TAKE 1 TABLET(1000 MG) BY MOUTH TWICE DAILY   . montelukast (SINGULAIR) 10 MG tablet Take 10 mg by mouth at bedtime.   . Multiple Vitamins-Minerals (MULTIVITAMIN WITH MINERALS) tablet Take 1 tablet by mouth daily.   . ondansetron (ZOFRAN-ODT) 4 MG disintegrating tablet Take by mouth.   . pantoprazole (PROTONIX) 40 MG tablet TAKE 1 TABLET(40 MG) BY MOUTH DAILY (Patient taking differently: 2 (two) times daily. TAKE 1 TABLET(40 MG) BY MOUTH DAILY)   . rosuvastatin (CRESTOR) 5 MG tablet TAKE 1 TABLET(5 MG) BY MOUTH DAILY   . sodium chloride HYPERTONIC 3 % nebulizer solution Take by nebulization as needed for other. Inhale contents of 1 vial via nebulizer twice daily as directed   . SPIRIVA RESPIMAT 1.25 MCG/ACT AERS INHALE 2 PUFFS BY MOUTH EVERY DAY   . spironolactone (ALDACTONE) 25 MG tablet Take 0.5 tablets (12.5 mg total) by mouth daily.   . [DISCONTINUED] albuterol (PROVENTIL HFA;VENTOLIN HFA) 108 (90 BASE) MCG/ACT inhaler Inhale 2 puffs into the lungs every 6 (six) hours as needed.   . [DISCONTINUED] Continuous Blood Gluc Sensor (FREESTYLE LIBRE 14 DAY SENSOR) MISC USE AS DIRECTED   . [DISCONTINUED] hydrocortisone 2.5 % cream APPLY EXTERNALLY TO THE AFFECTED AREA TWICE DAILY AS NEEDED (Patient taking differently: Apply 1 application  topically 2 (two) times daily as needed (for skin irritation/rash).) 08/20/2018: AS NEEDED  . albuterol (VENTOLIN HFA) 108 (90 Base) MCG/ACT inhaler Inhale 2 puffs into the lungs every 6 (six) hours as needed.   . Continuous Blood Gluc Sensor (FREESTYLE LIBRE 14 DAY SENSOR) MISC USE AS DIRECTED   . [DISCONTINUED] bifidobacterium infantis (ALIGN) capsule Take 1 capsule by mouth daily. (Patient not taking: Reported on 03/11/2020)   . [DISCONTINUED] Continuous Blood Gluc Receiver (FREESTYLE LIBRE 14 DAY READER) DEVI USE AS DIRECTED   . [DISCONTINUED] Continuous Blood Gluc Sensor (FREESTYLE LIBRE 14 DAY SENSOR) MISC USE AS DIRECTED   . [DISCONTINUED] fexofenadine (ALLEGRA) 180 MG tablet Take 180 mg by mouth every morning.     . [DISCONTINUED] levofloxacin (LEVAQUIN) 750 MG tablet Take 1 tablet (750 mg total) by mouth daily.   . [DISCONTINUED] predniSONE (DELTASONE) 20 MG tablet 2 Tablet's daily for 5 days .    No facility-administered encounter medications on file as of 03/11/2020.     ROS: Pertinent positives and negatives noted in HPI. Remainder of ROS non-contributory    Allergies  Allergen Reactions  . Butorphanol Tartrate Other (See Comments)    REACTION: hallucinations  . Hydrocodone Other (See Comments)    HALLUCINATIONS  . Roxicet [Oxycodone-Acetaminophen] Other (See Comments)    HALLUCINATIONS  . Doxycycline     DOES NOT WORK FOR PATIENT  . Penicillins Hives    Occurred at age 16/CHILDHOOD ALLERGY Has patient had a PCN reaction causing immediate rash, facial/tongue/throat swelling, SOB or lightheadedness with hypotension: Unknown Has patient had a PCN reaction causing severe rash involving mucus membranes or skin necrosis: Unknown Has patient had a PCN reaction that required hospitalization: Unknown Has patient had a PCN reaction occurring within the last 10 years: No If all of the above answers are "NO", then may proceed with Cephalosporin use.     BP 132/80  Pulse 76   Temp  (!) 97.5 F (36.4 C) (Temporal)   Ht 5\' 4"  (1.626 m)   Wt 191 lb 6.4 oz (86.8 kg)   SpO2 98%   BMI 32.85 kg/m   Wt Readings from Last 3 Encounters:  03/11/20 191 lb 6.4 oz (86.8 kg)  12/17/19 191 lb 6.4 oz (86.8 kg)  11/14/19 189 lb (85.7 kg)   Temp Readings from Last 3 Encounters:  03/11/20 (!) 97.5 F (36.4 C) (Temporal)  11/14/19 97.8 F (36.6 C) (Temporal)  11/04/19 (!) 97.2 F (36.2 C) (Oral)   BP Readings from Last 3 Encounters:  03/11/20 132/80  12/17/19 122/66  11/14/19 132/80   Pulse Readings from Last 3 Encounters:  03/11/20 76  12/17/19 81  11/14/19 84     Physical Exam Constitutional:      General: She is not in acute distress.    Appearance: Normal appearance. She is not ill-appearing.  Cardiovascular:     Rate and Rhythm: Normal rate and regular rhythm.     Pulses: Normal pulses.  Pulmonary:     Effort: Pulmonary effort is normal. No respiratory distress.     Breath sounds: Normal breath sounds.  Musculoskeletal:     Right lower leg: Edema present.     Left lower leg: Edema present.  Neurological:     Mental Status: She is alert and oriented to person, place, and time.  Psychiatric:        Mood and Affect: Mood normal.        Behavior: Behavior normal.      A/P:  1. Type 2 diabetes mellitus with complication (HCC) - home BS average 175 - pt recently started Weight Watchers, no regular CV exercise - on metformin 1000mg  BID - Hemoglobin A1c - Basic metabolic panel - f/u in 3 mo  2. Hyperlipidemia, unspecified hyperlipidemia type - on crestor 5mg  daily - Lipid panel   This visit occurred during the SARS-CoV-2 public health emergency.  Safety protocols were in place, including screening questions prior to the visit, additional usage of staff PPE, and extensive cleaning of exam room while observing appropriate contact time as indicated for disinfecting solutions.

## 2020-03-12 LAB — BASIC METABOLIC PANEL
BUN: 14 mg/dL (ref 6–23)
CO2: 28 mEq/L (ref 19–32)
Calcium: 9.5 mg/dL (ref 8.4–10.5)
Chloride: 103 mEq/L (ref 96–112)
Creatinine, Ser: 0.71 mg/dL (ref 0.40–1.20)
GFR: 91.76 mL/min (ref >60.00)
Glucose, Bld: 140 mg/dL — ABNORMAL HIGH (ref 70–99)
Potassium: 3.7 mEq/L (ref 3.5–5.1)
Sodium: 139 mEq/L (ref 135–145)

## 2020-03-12 LAB — HEMOGLOBIN A1C: Hgb A1c MFr Bld: 8 % — ABNORMAL HIGH (ref 4.6–6.5)

## 2020-03-12 LAB — LIPID PANEL
Cholesterol: 140 mg/dL (ref 0–200)
HDL: 62.8 mg/dL (ref >39.00)
LDL Cholesterol: 62 mg/dL (ref 0–99)
NonHDL: 76.98
Total CHOL/HDL Ratio: 2
Triglycerides: 75 mg/dL (ref 0.0–149.0)
VLDL: 15 mg/dL (ref 0.0–40.0)

## 2020-03-17 ENCOUNTER — Encounter: Payer: Self-pay | Admitting: Family Medicine

## 2020-03-17 ENCOUNTER — Other Ambulatory Visit: Payer: Self-pay | Admitting: Family Medicine

## 2020-03-17 DIAGNOSIS — E118 Type 2 diabetes mellitus with unspecified complications: Secondary | ICD-10-CM

## 2020-03-17 MED ORDER — PIOGLITAZONE HCL 15 MG PO TABS: 15.0000 mg | ORAL_TABLET | Freq: Every day | ORAL | 3 refills | Status: DC

## 2020-04-07 ENCOUNTER — Encounter: Payer: Self-pay | Admitting: Family Medicine

## 2020-04-07 DIAGNOSIS — E118 Type 2 diabetes mellitus with unspecified complications: Secondary | ICD-10-CM

## 2020-04-07 MED ORDER — SITAGLIPTIN PHOSPHATE 50 MG PO TABS
50.0000 mg | ORAL_TABLET | Freq: Every day | ORAL | 3 refills | Status: DC
Start: 2020-04-07 — End: 2020-08-12

## 2020-05-30 ENCOUNTER — Other Ambulatory Visit: Payer: Self-pay | Admitting: Family Medicine

## 2020-06-24 ENCOUNTER — Other Ambulatory Visit: Payer: Self-pay | Admitting: Pulmonary Disease

## 2020-06-25 ENCOUNTER — Telehealth: Payer: Self-pay | Admitting: Pulmonary Disease

## 2020-06-25 MED ORDER — LEVOFLOXACIN 750 MG PO TABS: 750.0000 mg | ORAL_TABLET | Freq: Every day | ORAL | 0 refills | Status: DC

## 2020-06-25 MED ORDER — PREDNISONE 20 MG PO TABS: 40.0000 mg | ORAL_TABLET | Freq: Every day | ORAL | 0 refills | Status: DC

## 2020-06-25 NOTE — Telephone Encounter (Signed)
Spoke with pt, aware of recs.  rx's sent to preferred pharmacy.  Nothing further needed at this time- will close encounter.    

## 2020-06-25 NOTE — Telephone Encounter (Signed)
Ok to order levaquin 750 mg/day for 7 days 5 days of prednisone 40 mg/day

## 2020-06-25 NOTE — Telephone Encounter (Signed)
Spoke with pt, c/o increased prod cough with yellow mucus, sob X5 days. States she believes this is a bronchiectasis exacerbation. Home covid test X2 negative.  Denies fever, chest pain, sinus congestion, PND.  Pt has been using hypertonic saline, Pt requesting pred taper and levaquin. Pt notes that doxycycline does not work for her.   Pharmacy: [facility] on [address].   Dr. Isaiah Serge please advise on recs, thanks!

## 2020-07-06 ENCOUNTER — Telehealth: Payer: Self-pay | Admitting: Pulmonary Disease

## 2020-07-06 NOTE — Telephone Encounter (Signed)
Called and spoke with pt and she stated that she has improved a little.  She stated that the rattle that she had in her RLL has cleared now.  She stated that the cough is now producing yellow/brown sputum.  She is wanted to come in to be sure that she does not have PNA again.  She stated that she did had a little fatigue like she did when she had PNA before.  appt scheduled with JE on 06/01 at 215.

## 2020-07-07 ENCOUNTER — Other Ambulatory Visit: Payer: Self-pay

## 2020-07-07 ENCOUNTER — Ambulatory Visit: Payer: BC Managed Care – PPO | Admitting: Pulmonary Disease

## 2020-07-07 ENCOUNTER — Other Ambulatory Visit: Payer: Self-pay | Admitting: Primary Care

## 2020-07-07 ENCOUNTER — Ambulatory Visit (INDEPENDENT_AMBULATORY_CARE_PROVIDER_SITE_OTHER): Payer: BC Managed Care – PPO

## 2020-07-07 ENCOUNTER — Encounter: Payer: Self-pay | Admitting: Pulmonary Disease

## 2020-07-07 VITALS — BP 118/76 | HR 83 | Temp 97.1°F | Ht 64.0 in | Wt 185.4 lb

## 2020-07-07 DIAGNOSIS — J471 Bronchiectasis with (acute) exacerbation: Secondary | ICD-10-CM

## 2020-07-07 DIAGNOSIS — J4550 Severe persistent asthma, uncomplicated: Secondary | ICD-10-CM

## 2020-07-07 DIAGNOSIS — R059 Cough, unspecified: Secondary | ICD-10-CM

## 2020-07-07 MED ORDER — ONDANSETRON 4 MG PO TBDP
4.0000 mg | ORAL_TABLET | Freq: Three times a day (TID) | ORAL | 0 refills | Status: DC | PRN
Start: 2020-07-07 — End: 2021-05-19

## 2020-07-07 MED ORDER — LEVOFLOXACIN 750 MG PO TABS: 750.0000 mg | ORAL_TABLET | Freq: Every day | ORAL | 0 refills | Status: DC

## 2020-07-07 MED ORDER — FLUCONAZOLE 150 MG PO TABS: 150.0000 mg | ORAL_TABLET | Freq: Every day | ORAL | 0 refills | Status: DC

## 2020-07-07 MED ORDER — PREDNISONE 10 MG PO TABS: ORAL_TABLET | ORAL | 0 refills | Status: AC

## 2020-07-07 NOTE — Patient Instructions (Addendum)
Asthma and bronchiectasis exacerbation --Restart levaquin 750 x 7 days --Prednisone taper  Yeast infection --Fluconazole as needed when you develop symptoms  Follow-up with Dr. Isaiah Serge in one month. Call sooner if needed

## 2020-07-07 NOTE — Progress Notes (Signed)
Subjective:   PATIENT ID: Karen Barnes GENDER: female DOB: 08-08-1960, MRN: [id number]   HPI  Chief Complaint  Patient presents with   Follow-up    Approximately 2 weeks ago felt "vibration" in right lung with thick dark mucus production. Started on antibiotics with some improvement initially but started getting worse again on Saturday.     Reason for Visit: Acute visit  Ms. Karen Barnes is a 60 year old female with asthma who presents for acute visit. She is followed by Dr. Isaiah Serge in Pulmonary clinic.  On telephone visit 5/20 She is having productive cough with yellow-borown sputum x 6 days thought to be realted to her bronchiectasis exacerbation. COVID tes negative x 2. Had been using hypertonic saline. Was started on levaquin for 7 days and prednisone five days.   She is on Symbicort and Spiriva, hypertonic saline BID and therapy. Also on Dupixent and singulair for her. Since completing her antibiotics, she reports that her cough worsened and became productive. She can fill up to 3/4 dixie cup of nonbloody mucous. She has had some wheezing and rattle in her right lung.  I have personally reviewed patient's past medical/family/social history, allergies, current medications.  Past Medical History:  Diagnosis Date   Asthma, moderate persistent    sees Dr Irena Cords   Atrial tachycardia [facility])    Chronic diastolic CHF (congestive heart failure) (HCC)    COVID-19 virus infection 01/2019   Diabetes mellitus    A1C 6.2---->  2009   Family history of adverse reaction to anesthesia    sister had cardiac arrest with rhinoplasty   Fibrocystic breast    GERD (gastroesophageal reflux disease)    HH (hiatus hernia) 09/2014   Dr. Ewing Schlein   History of echocardiogram    Echo 5/19: EF 55-60, normal wall motion, PASP 41   History of hiatal hernia    History of nuclear stress test    Nuclear stress test 5/19:  EF 64, normal perfusion, Low risk   Hot flashes    Hyperlipidemia     Premature atrial contractions    Pulmonary hypertension (HCC)    PVC's (premature ventricular contractions)    Recurrent cystitis    took  postcoital antibiotic for years, symptoms better after her hysterectomy 1996, symptoms re-surface in 2011   RUQ abdominal pain 2016   EGD showed small HH, otherwise normal   Wears glasses      Family History  Problem Relation Age of Onset   Mitral valve prolapse Mother    Breast cancer Mother 50       M had ca insitu (age 22), aunt    Dementia Mother    Heart disease Father 53       early    Diabetes Father    Kidney failure Father    Sleep apnea Father    Brain cancer Other        2 fam members    Lung cancer Other        uncle, heavy smoker   Breast cancer Maternal Aunt 74   CAD Paternal Grandmother 20   CAD Paternal Grandfather 69   Colon cancer Neg Hx      Social History   Occupational History   Occupation: retired principal  Tobacco Use   Smoking status: Never Smoker   Smokeless tobacco: Never Used  Building services engineer Use: Never used  Substance and Sexual Activity   Alcohol use: No   Drug use:  No   Sexual activity: Yes    Allergies  Allergen Reactions   Butorphanol Tartrate Other (See Comments)    REACTION: hallucinations   Hydrocodone Other (See Comments)    HALLUCINATIONS   Roxicet [Oxycodone-Acetaminophen] Other (See Comments)    HALLUCINATIONS   Doxycycline     DOES NOT WORK FOR PATIENT   Penicillins Hives    Occurred at age 68/CHILDHOOD ALLERGY Has patient had a PCN reaction causing immediate rash, facial/tongue/throat swelling, SOB or lightheadedness with hypotension: Unknown Has patient had a PCN reaction causing severe rash involving mucus membranes or skin necrosis: Unknown Has patient had a PCN reaction that required hospitalization: Unknown Has patient had a PCN reaction occurring within the last 10 years: No If all of the above answers are "NO", then may proceed with Cephalosporin use.       Outpatient Medications Prior to Visit  Medication Sig Dispense Refill   albuterol (VENTOLIN HFA) 108 (90 Base) MCG/ACT inhaler Inhale 2 puffs into the lungs every 6 (six) hours as needed. 8 g 2   aspirin EC 81 MG tablet Take 81 mg by mouth daily.     budesonide-formoterol (SYMBICORT) 160-4.5 MCG/ACT inhaler Inhale 2 puffs into the lungs 2 (two) times daily.     Calcium Carbonate-Vitamin D 600-200 MG-UNIT TABS Take 1 tablet by mouth daily.     celecoxib (CELEBREX) 200 MG capsule Take 200 mg by mouth daily.     Continuous Blood Gluc Sensor (FREESTYLE LIBRE 14 DAY SENSOR) MISC USE AS DIRECTED 3 each 3   DUPIXENT 300 MG/2ML prefilled syringe INJECT 1 SYRINGE UNDER THE SKIN EVERY OTHER WEEK 12 mL 10   EPINEPHrine 0.3 mg/0.3 mL IJ SOAJ injection Inject 0.3 mg into the muscle as directed. For immunotherapy (allergy shots) anaphylactic reactions     Estradiol 10 MCG TABS vaginal tablet Place vaginally.     fluticasone (FLONASE) 50 MCG/ACT nasal spray Place 2 sprays into both nostrils 2 (two) times daily.      furosemide (LASIX) 20 MG tablet TAKE 1 TABLET(20 MG) BY MOUTH DAILY AS NEEDED FOR FLUID RETENTION OR SWELLING 30 tablet 8   lactobacillus acidophilus (BACID) TABS tablet Take 2 tablets by mouth 3 (three) times daily.     metFORMIN (GLUCOPHAGE) 1000 MG tablet TAKE 1 TABLET(1000 MG) BY MOUTH TWICE DAILY 180 tablet 3   montelukast (SINGULAIR) 10 MG tablet Take 10 mg by mouth at bedtime.     Multiple Vitamins-Minerals (MULTIVITAMIN WITH MINERALS) tablet Take 1 tablet by mouth daily.     pantoprazole (PROTONIX) 40 MG tablet TAKE 1 TABLET(40 MG) BY MOUTH DAILY (Patient taking differently: 2 (two) times daily. TAKE 1 TABLET(40 MG) BY MOUTH DAILY) 90 tablet 3   rosuvastatin (CRESTOR) 5 MG tablet TAKE 1 TABLET(5 MG) BY MOUTH DAILY 90 tablet 3   sitaGLIPtin (JANUVIA) 50 MG tablet Take 1 tablet (50 mg total) by mouth daily. 90 tablet 3   sodium chloride HYPERTONIC 3 % nebulizer solution Take by  nebulization as needed for other. Inhale contents of 1 vial via nebulizer twice daily as directed 750 mL 12   SPIRIVA RESPIMAT 1.25 MCG/ACT AERS INHALE 2 PUFFS BY MOUTH EVERY DAY 4 g 5   spironolactone (ALDACTONE) 25 MG tablet Take 0.5 tablets (12.5 mg total) by mouth daily. 45 tablet 3   ondansetron (ZOFRAN-ODT) 4 MG disintegrating tablet Take by mouth. (Patient not taking: Reported on 07/07/2020)     levofloxacin (LEVAQUIN) 750 MG tablet Take 1 tablet (750 mg total) by  mouth daily. 7 tablet 0   predniSONE (DELTASONE) 20 MG tablet Take 2 tablets (40 mg total) by mouth daily with breakfast. 10 tablet 0   No facility-administered medications prior to visit.    Review of Systems  Constitutional:  Negative for chills, diaphoresis, fever, malaise/fatigue and weight loss.  HENT:  Negative for congestion.   Respiratory:  Positive for cough, sputum production, shortness of breath and wheezing. Negative for hemoptysis.   Cardiovascular:  Negative for chest pain, palpitations and leg swelling.    Objective:   Vitals:   07/07/20 1409  BP: 118/76  Pulse: 83  Temp: (!) 97.1 F (36.2 C)  TempSrc: Temporal  SpO2: 95%  Weight: 185 lb 6.4 oz (84.1 kg)  Height: 5\' 4"  (1.626 m)   SpO2: 95 % (RA) O2 Device: None (Room air)  Physical Exam: General: Well-appearing, no acute distress HENT: [place], AT Eyes: EOMI, no scleral icterus Respiratory: Expiratory wheezes bilaterally Cardiovascular: RRR, -M/R/G, no JVD Extremities:-Edema,-tenderness Neuro: AAO x4, CNII-XII grossly intact Skin: Intact, no rashes or bruising Psych: Normal mood, normal affect  Data Reviewed:  Imaging: CXR 07/07/20 - No infiltrate, effusion or edema  Imaging, labs and test noted above have been reviewed independently by me.    Assessment & Plan:   Discussion:  Asthma and bronchiectasis exacerbation --Restart levaquin 750 x 7 days --Prednisone taper  Yeast infection --Fluconazole as needed when you develop  symptoms  Health Maintenance Immunization History  Administered Date(s) Administered   Influenza Inj Mdck Quad Pf 10/01/2018   Influenza Split 03/12/2012, 11/12/2013, 11/07/2014   Influenza Whole 12/08/2009   Influenza,inj,Quad PF,6+ Mos 12/06/2012, 11/14/2017   Influenza-Unspecified 11/06/2016, 10/01/2018   PFIZER(Purple Top)SARS-COV-2 Vaccination 05/12/2019, 06/03/2019   Pneumococcal Conjugate-13 09/24/2015   Pneumococcal Polysaccharide-23 09/18/2014   Td 03/07/2007   Tdap 03/30/2017   Zoster Recombinat (Shingrix) 10/26/2018, 01/03/2019    Orders Placed This Encounter  Procedures   DG Chest 2 View    Standing Status:   Future    Number of Occurrences:   1    Standing Expiration Date:   07/07/2021    Order Specific Question:   Reason for Exam (SYMPTOM  OR DIAGNOSIS REQUIRED)    Answer:   cough    Order Specific Question:   Preferred imaging location?    Answer:   Internal  No orders of the defined types were placed in this encounter.   No follow-ups on file.  I have spent a total time of 32-minutes on the day of the appointment reviewing prior documentation, coordinating care and discussing medical diagnosis and plan with the patient/family. Imaging, labs and tests included in this note have been reviewed and interpreted independently by me.  Jaymarie Yeakel 09/06/2021, MD [facility] 07/07/2020 2:54 PM  Office Number [phone]

## 2020-07-07 NOTE — Telephone Encounter (Signed)
I see patient was seen this afternoon. Are continuing this therapy?

## 2020-07-19 NOTE — Telephone Encounter (Signed)
Routed message to Dr. Isaiah Serge as he is pt's main pulmonologist

## 2020-07-19 NOTE — Telephone Encounter (Signed)
Scheduled pt for televisit on 6/15 at 9:45. Routing to Dr. Isaiah Serge as Karen Barnes

## 2020-07-19 NOTE — Telephone Encounter (Signed)
Dr. Everardo All please advise on the following My Chart message:   Hello,   I saw you after completing one course of Prednisone and Levaquin 750.  After seeing me, you prescribed a second course.  I have completed the second course of Levaquin and will finish the last Prednisone tomorrow morning.  I am still coughing up brownish yellow thick mucus.  Not as much in quantity, but still getting it out with Mucinex, Nebulizing, and Therapy Vest Treatment.   We are preparing to leave on vacation for two weeks.  I am wondering if I should have another round of medication or at least take some with me in case I decline while gone. What are your thoughts?   Thank You!   Kathee Polite    Pt was encouraged to call office and schedule an OV with an NP as well.  Thank you

## 2020-07-21 ENCOUNTER — Other Ambulatory Visit: Payer: Self-pay

## 2020-07-21 ENCOUNTER — Ambulatory Visit (INDEPENDENT_AMBULATORY_CARE_PROVIDER_SITE_OTHER): Payer: BC Managed Care – PPO | Admitting: Pulmonary Disease

## 2020-07-21 DIAGNOSIS — J471 Bronchiectasis with (acute) exacerbation: Secondary | ICD-10-CM

## 2020-07-21 MED ORDER — PREDNISONE 10 MG PO TABS: ORAL_TABLET | ORAL | 0 refills | Status: DC

## 2020-07-21 MED ORDER — LEVOFLOXACIN 750 MG PO TABS: 750.0000 mg | ORAL_TABLET | Freq: Every day | ORAL | 0 refills | Status: DC

## 2020-07-21 MED ORDER — FLUCONAZOLE 150 MG PO TABS: 150.0000 mg | ORAL_TABLET | Freq: Every day | ORAL | 0 refills | Status: DC

## 2020-07-21 NOTE — Progress Notes (Signed)
Karen Barnes    [id number]    [birth year]  Primary Care Physician:Cirigliano, Jearld Lesch, DO  Referring Physician: Overton Mam, DO [address] [place],  [place] [address]  Virtual Visit via Telephone Note  I connected with Karen Barnes on 07/21/20 at 10:45 AM EDT by telephone and verified that I am speaking with the correct person using two identifiers.  Location: Patient: Home Provider: [facility], [address], [place], [place]   I discussed the limitations, risks, security and privacy concerns of performing an evaluation and management service by telephone and the availability of in person appointments. I also discussed with the patient that there may be a patient responsible charge related to this service. The patient expressed understanding and agreed to proceed.  Chief complaint:   Follow-up for asthma, bronchiectasis  HPI: 60 year old with history of asthma, bronchiectasis immunotherapy with Dupixent started in 2019 Maintained on Symbicort, Spiriva. She has severe GERD and is followed by Dr. Ewing Schlein.  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  She is doing person classes at present.  Developed COVID-19 in December 2020.  She was treated with monoclonal antibody as an outpatient and did not require hospitalization.  Overall she recovered well with chest x-ray last month showing clear lungs  She gets regular allergy shots with Dr. Madie Reno  Interim history: Continues with Dupixent therapy and inhalers, Singulair  Has a prolonged bronchitis, asthma exacerbation requiring 2 rounds of Levaquin and prednisone.  She felt temporarily good but is starting to have symptoms again.  She is allergic to penicillins and doxycycline, macrolides have been ineffective in the past  Outpatient Encounter Medications as of 07/21/2020  Medication Sig   albuterol (VENTOLIN HFA) 108 (90 Base) MCG/ACT inhaler Inhale 2  puffs into the lungs every 6 (six) hours as needed.   aspirin EC 81 MG tablet Take 81 mg by mouth daily.   budesonide-formoterol (SYMBICORT) 160-4.5 MCG/ACT inhaler Inhale 2 puffs into the lungs 2 (two) times daily.   Calcium Carbonate-Vitamin D 600-200 MG-UNIT TABS Take 1 tablet by mouth daily.   Continuous Blood Gluc Sensor (FREESTYLE LIBRE 14 DAY SENSOR) MISC USE AS DIRECTED   DUPIXENT 300 MG/2ML prefilled syringe INJECT 1 SYRINGE UNDER THE SKIN EVERY OTHER WEEK   EPINEPHrine 0.3 mg/0.3 mL IJ SOAJ injection Inject 0.3 mg into the muscle as directed. For immunotherapy (allergy shots) anaphylactic reactions   Estradiol 10 MCG TABS vaginal tablet Place vaginally.   fluticasone (FLONASE) 50 MCG/ACT nasal spray Place 2 sprays into both nostrils 2 (two) times daily.    furosemide (LASIX) 20 MG tablet TAKE 1 TABLET(20 MG) BY MOUTH DAILY AS NEEDED FOR FLUID RETENTION OR SWELLING   lactobacillus acidophilus (BACID) TABS tablet Take 2 tablets by mouth 3 (three) times daily.   metFORMIN (GLUCOPHAGE) 1000 MG tablet TAKE 1 TABLET(1000 MG) BY MOUTH TWICE DAILY   montelukast (SINGULAIR) 10 MG tablet Take 10 mg by mouth at bedtime.   Multiple Vitamins-Minerals (MULTIVITAMIN WITH MINERALS) tablet Take 1 tablet by mouth daily.   ondansetron (ZOFRAN-ODT) 4 MG disintegrating tablet Take 1 tablet (4 mg total) by mouth every 8 (eight) hours as needed for nausea or vomiting.   pantoprazole (PROTONIX) 40 MG tablet TAKE 1 TABLET(40 MG) BY MOUTH DAILY (Patient taking differently: 2 (two) times daily. TAKE 1 TABLET(40 MG) BY MOUTH DAILY)   rosuvastatin (CRESTOR) 5 MG tablet TAKE 1 TABLET(5 MG)  BY MOUTH DAILY   sodium chloride HYPERTONIC 3 % nebulizer solution INHALE CONTENTS OF 1 VIAL VIA NEBULIZER TWICE DAILY AS DIRECTED.   SPIRIVA RESPIMAT 1.25 MCG/ACT AERS INHALE 2 PUFFS BY MOUTH EVERY DAY   spironolactone (ALDACTONE) 25 MG tablet Take 0.5 tablets (12.5 mg total) by mouth daily.   celecoxib (CELEBREX) 200 MG capsule  Take 200 mg by mouth daily.   fluconazole (DIFLUCAN) 150 MG tablet Take 1 tablet (150 mg total) by mouth daily.   levofloxacin (LEVAQUIN) 750 MG tablet Take 1 tablet (750 mg total) by mouth daily.   sitaGLIPtin (JANUVIA) 50 MG tablet Take 1 tablet (50 mg total) by mouth daily.   No facility-administered encounter medications on file as of 07/21/2020.   Physical Exam: Tele  Data Reviewed: Imaging: November 07, 2017 high-resolution CT scan of the chest images : Mild cylindrical bronchiectasis particularly in the left lower lobe, some in the right lower lobe.   10/18/2015 chest CT images  showing normal pulmonary parenchyma with the exception of a 3.4 mm nodule in the left lower lobe.  Chest x-ray 06/10/2019-clear lungs   Pulmonary function test: October 2019 ratio 90%, FVC 2.56 L 76% predicted, total lung capacity 4.32 L 84% predicted, DLCO 20.7 mL 84% predicted   Labs: October 2019 complement levels within normal limits, IgG subclasses all normal, alpha-1 antitrypsin level normal, CBC showed 300 absolute eosinophil count   11/03/2015 - Ige - 32   Heart catheterization: October 2019 right heart catheterization showed mean PA pressure 24, pulmonary capillary wedge pressure 19, PVR 0.7 Woods units  Assessment:  Acute bronchitis, bronchiectasis exacerbation Will need additional round of levofloxacin and prednisone Diflucan for thrush prophylaxis  Bronchiectasis Continue chest vest, Mucinex.  Hypertonic saline for ciliary clearance   Severe persistent asthma Continue Symbicort, Spiriva, Dupixent She can use over-the-counter Zyrtec for postnasal drip Follows with Dr. Madie Reno for allergy shots  Plan/Recommendations: Levofloxacin for 7 days Prednisone taper starting at 60 mg.  Reduce dose to 10 mg every 2 days Diflucan  I discussed the assessment and treatment plan with the patient. The patient was provided an opportunity to ask questions and all were answered. The patient agreed  with the plan and demonstrated an understanding of the instructions.   The patient was advised to call back or seek an in-person evaluation if the symptoms worsen or if the condition fails to improve as anticipated.  I provided 15 minutes of non-face-to-face time during this encounter.  [name] MD [facility] 07/21/2020, 11:00 AM  CC: Overton Mam, DO

## 2020-07-21 NOTE — Patient Instructions (Signed)
We will call in Levaquin 750 mg once a day for 7 days Pred taper starting at 60 mg a day.  Reduce by 10 mg every 2 days We will also call in Diflucan 150 mg once a day for 1 day to help prevent thrush  Use [facility] at [facility] Follow-up in clinic in 3 to 6 months

## 2020-08-02 ENCOUNTER — Other Ambulatory Visit: Payer: Self-pay | Admitting: Primary Care

## 2020-08-12 ENCOUNTER — Ambulatory Visit: Payer: Self-pay | Admitting: *Deleted

## 2020-08-12 ENCOUNTER — Telehealth: Payer: BC Managed Care – PPO | Admitting: Physician Assistant

## 2020-08-12 DIAGNOSIS — H1013 Acute atopic conjunctivitis, bilateral: Secondary | ICD-10-CM | POA: Diagnosis not present

## 2020-08-12 NOTE — Progress Notes (Signed)
E-Visit for [employer]   We are sorry that you are not feeling well.  Here is how we plan to help!  Based on what you have shared with me it looks like you have conjunctivitis.  Conjunctivitis is a common inflammatory or infectious condition of the eye that is often referred to as "pink eye".  In most cases it is contagious (viral or bacterial). However, not all conjunctivitis requires antibiotics (ex. Allergic).  We have made appropriate suggestions for you based upon your presentation.  I do feel your presentation is consistent with allergic conjunctivitis.  I recommend that you use OpconA, 1-2 drops every 4-6 hours (an over the counter allergy drop available at your local pharmacy).  Your pharmacist may have an alternative suggestion. You can also use Pataday allergy eye drops as an alternative. This too is available over the counter.   Pink eye can be highly contagious.  It is typically spread through direct contact with secretions, or contaminated objects or surfaces that one may have touched.  Strict handwashing is suggested with soap and water is urged.  If not available, use alcohol based had sanitizer.  Avoid unnecessary touching of the eye.  If you wear contact lenses, you will need to refrain from wearing them until you see no white discharge from the eye for at least 24 hours after being on medication.  You should see symptom improvement in 1-2 days after starting the medication regimen.  Call us if symptoms are not improved in 1-2 days.  Home Care: Wash your hands often! Do not wear your contacts until you complete your treatment plan. Avoid sharing towels, bed linen, personal items with a person who has pink eye. See attention for anyone in your home with similar symptoms.  Get Help Right Away If: Your symptoms do not improve. You develop blurred or loss of vision. Your symptoms worsen (increased discharge, pain or redness)   Thank you for choosing an e-visit.  Your e-visit  answers were reviewed by a board certified advanced clinical practitioner to complete your personal care plan. Depending upon the condition, your plan could have included both over the counter or prescription medications.  Please review your pharmacy choice. Make sure the pharmacy is open so you can pick up prescription now. If there is a problem, you may contact your provider through [employer] and have the prescription routed to another pharmacy.  Your safety is important to [place]. If you have drug allergies check your prescription carefully.   For the next 24 hours you can use MyChart to ask questions about today's visit, request a non-urgent call back, or ask for a work or school excuse. You will get an email in the next two days asking about your experience. I hope that your e-visit has been valuable and will speed your recovery.  I provided 5 minutes of non face-to-face time during this encounter for chart review and documentation.

## 2020-08-12 NOTE — Telephone Encounter (Signed)
Patient called from community line to request advise due to eye pain and eyelid swelling. Patient reports she has been treated for pink eye via E- visit today . Medication that was prescribed is burning her eyes  and now eyelids are swollen. Patient reports she is moving and could not come in to see PCP today. Instructed patient to contact [facility] at [facility]. Tele-health nurse should be available and can reach on call provider if needed. Patient states she has called the [facility] office and was told she could call [facility] triage. Recommended if she could not get in touch with NT at [facility] for advise to go to [facility] if symptoms worsen. Patient verbalized understanding and reports she will try [facility] office again this pm.

## 2020-08-13 ENCOUNTER — Telehealth: Payer: Self-pay | Admitting: Family Medicine

## 2020-08-13 NOTE — Telephone Encounter (Signed)
Pt thinks she has pink eye, she had an echart visit yesterday with another provider in [facility] They gave her Pataday and this has not helped. She wants a prescription sent to [facility] on [address]. She is moving all day today. I informed her a couple of times-she would need to be seen by [place] for this or go to an urgent care. Please advise pt.

## 2020-08-13 NOTE — Telephone Encounter (Signed)
Pt had a Evisit yesterday in regards to her pink eye, which she was told to use OTC drops for it.  Pt called yesterday and spoke to a triage nurse, RN as well and told to call this office back.  Pt called office after hours and declined the triage.  Pt also was offered a office visit today and refused.  Please advise.

## 2020-08-13 NOTE — Telephone Encounter (Signed)
I spoke with pt and informed her recommendations.  Pt said thank you very much and hung up.

## 2020-08-16 ENCOUNTER — Ambulatory Visit: Payer: BC Managed Care – PPO | Admitting: Family Medicine

## 2020-08-16 ENCOUNTER — Encounter: Payer: Self-pay | Admitting: Family Medicine

## 2020-08-16 ENCOUNTER — Other Ambulatory Visit: Payer: Self-pay

## 2020-08-16 VITALS — BP 130/68 | HR 74 | Temp 97.4°F | Wt 171.0 lb

## 2020-08-16 DIAGNOSIS — H1013 Acute atopic conjunctivitis, bilateral: Secondary | ICD-10-CM | POA: Diagnosis not present

## 2020-08-16 DIAGNOSIS — H11423 Conjunctival edema, bilateral: Secondary | ICD-10-CM

## 2020-08-16 MED ORDER — TOBRAMYCIN-DEXAMETHASONE 0.3-0.1 % OP SUSP: 2.0000 [drp] | Freq: Four times a day (QID) | OPHTHALMIC | 0 refills | Status: DC

## 2020-08-16 NOTE — Progress Notes (Signed)
Established Patient Office Visit  Subjective:  Patient ID: Karen Barnes, female    DOB: [birth year]  Age: 60 y.o. MRN: [id number]  CC:  Chief Complaint  Patient presents with   Follow-up    Pt c/o eye irritation, burning, watery, symptoms increases in the am. Some relief from otc eye drops.    HPI Karen Barnes presents for 1 week history of itchy watery eyes after cleaning out the attic in the house that is going up for sale.  There is been watery discharge no purulence.  There is been no AM matting.  She does not wear contacts.  Vision is affected by the watery discharge only.   Past Medical History:  Diagnosis Date   Asthma, moderate persistent    sees Dr Irena Cords   Atrial tachycardia [facility])    Chronic diastolic CHF (congestive heart failure) (HCC)    COVID-19 virus infection 01/2019   Diabetes mellitus    A1C 6.2---->  2009   Family history of adverse reaction to anesthesia    sister had cardiac arrest with rhinoplasty   Fibrocystic breast    GERD (gastroesophageal reflux disease)    HH (hiatus hernia) 09/2014   Dr. Ewing Schlein   History of echocardiogram    Echo 5/19: EF 55-60, normal wall motion, PASP 41   History of hiatal hernia    History of nuclear stress test    Nuclear stress test 5/19:  EF 64, normal perfusion, Low risk   Hot flashes    Hyperlipidemia    Premature atrial contractions    Pulmonary hypertension (HCC)    PVC's (premature ventricular contractions)    Recurrent cystitis    took  postcoital antibiotic for years, symptoms better after her hysterectomy 1996, symptoms re-surface in 2011   RUQ abdominal pain 2016   EGD showed small HH, otherwise normal   Wears glasses     Past Surgical History:  Procedure Laterality Date   ABDOMINAL HYSTERECTOMY  1996   no oophorectomy, d/tendometriosis and fibroids approximately 1996   BREAST EXCISIONAL BIOPSY Right 03/12/2014   [facility]   BREAST LUMPECTOMY WITH RADIOACTIVE SEED LOCALIZATION Right 03/12/2014    Procedure: BREAST LUMPECTOMY WITH RADIOACTIVE SEED LOCALIZATION;  Surgeon: Harriette Bouillon, MD;  Location: [facility];  Service: General;  Laterality: Right;   BREAST SURGERY     Lumpectomy-- BENIGN    CATARACT EXTRACTION Bilateral 2011   CESAREAN SECTION     COSMETIC SURGERY  01/2017   [place] Touch w/ GYN   DILATION AND CURETTAGE OF UTERUS     LIPOMA EXCISION  1978   RIGHT HEART CATH N/A 11/26/2017   Procedure: RIGHT HEART CATH;  Surgeon: Laurey Morale, MD;  Location: [facility] INVASIVE CV LAB;  Service: Cardiovascular;  Laterality: N/A;   UPPER GASTROINTESTINAL ENDOSCOPY     UPPER GI ENDOSCOPY  09/11/2014   Small Hiatus Hernia, Dr. Ewing Schlein    Family History  Problem Relation Age of Onset   Mitral valve prolapse Mother    Breast cancer Mother 88       M had ca insitu (age 55), aunt    Dementia Mother    Heart disease Father 33       early    Diabetes Father    Kidney failure Father    Sleep apnea Father    Brain cancer Other        2 fam members    Lung cancer Other  uncle, heavy smoker   Breast cancer Maternal Aunt 14   CAD Paternal Grandmother 34   CAD Paternal Grandfather 74   Colon cancer Neg Hx     Social History   Socioeconomic History   Marital status: Married    Spouse name: Not on file   Number of children: 1   Years of education: College   Highest education level: Not on file  Occupational History   Occupation: retired principal  Tobacco Use   Smoking status: Never   Smokeless tobacco: Never  Vaping Use   Vaping Use: Never used  Substance and Sexual Activity   Alcohol use: No   Drug use: No   Sexual activity: Yes  Other Topics Concern   Not on file  Social History Narrative   Household-- pt and husband    Rare caffeine use   Daughter independent, 1 G-child   Engineer, structural for F and M, both w/ dementia   Social Determinants of Health   Financial Resource Strain: Not on file  Food Insecurity: Not on file  Transportation Needs: Not  on file  Physical Activity: Not on file  Stress: Not on file  Social Connections: Not on file  Intimate Partner Violence: Not on file    Outpatient Medications Prior to Visit  Medication Sig Dispense Refill   albuterol (VENTOLIN HFA) 108 (90 Base) MCG/ACT inhaler Inhale 2 puffs into the lungs every 6 (six) hours as needed. 8 g 2   aspirin EC 81 MG tablet Take 81 mg by mouth daily.     budesonide-formoterol (SYMBICORT) 160-4.5 MCG/ACT inhaler Inhale 2 puffs into the lungs 2 (two) times daily.     Calcium Carbonate-Vitamin D 600-200 MG-UNIT TABS Take 1 tablet by mouth daily.     Continuous Blood Gluc Sensor (FREESTYLE LIBRE 14 DAY SENSOR) MISC USE AS DIRECTED 3 each 3   DUPIXENT 300 MG/2ML prefilled syringe INJECT 1 SYRINGE UNDER THE SKIN EVERY OTHER WEEK 12 mL 10   EPINEPHrine 0.3 mg/0.3 mL IJ SOAJ injection Inject 0.3 mg into the muscle as directed. For immunotherapy (allergy shots) anaphylactic reactions     Estradiol 10 MCG TABS vaginal tablet Place vaginally.     fluconazole (DIFLUCAN) 150 MG tablet Take 1 tablet (150 mg total) by mouth daily. 1 tablet 0   fluticasone (FLONASE) 50 MCG/ACT nasal spray Place 2 sprays into both nostrils 2 (two) times daily.      furosemide (LASIX) 20 MG tablet TAKE 1 TABLET(20 MG) BY MOUTH DAILY AS NEEDED FOR FLUID RETENTION OR SWELLING 30 tablet 8   lactobacillus acidophilus (BACID) TABS tablet Take 2 tablets by mouth 3 (three) times daily. (Patient not taking: Reported on 08/16/2020)     metFORMIN (GLUCOPHAGE) 1000 MG tablet TAKE 1 TABLET(1000 MG) BY MOUTH TWICE DAILY 180 tablet 3   montelukast (SINGULAIR) 10 MG tablet Take 10 mg by mouth at bedtime.     Multiple Vitamins-Minerals (MULTIVITAMIN WITH MINERALS) tablet Take 1 tablet by mouth daily.     ondansetron (ZOFRAN-ODT) 4 MG disintegrating tablet Take 1 tablet (4 mg total) by mouth every 8 (eight) hours as needed for nausea or vomiting. 20 tablet 0   pantoprazole (PROTONIX) 40 MG tablet TAKE 1 TABLET(40  MG) BY MOUTH DAILY (Patient taking differently: 2 (two) times daily. TAKE 1 TABLET(40 MG) BY MOUTH DAILY) 90 tablet 3   predniSONE (DELTASONE) 10 MG tablet 60mg  x's 2days,50mg x's2 days,40mg x's 2 days,30mg x2days,20mg x2days,10mg x2days, stop 42 tablet 0   rosuvastatin (CRESTOR) 5 MG tablet TAKE 1  TABLET(5 MG) BY MOUTH DAILY 90 tablet 3   sodium chloride HYPERTONIC 3 % nebulizer solution INHALE CONTENTS OF 1 VIAL VIA NEBULIZER TWICE DAILY AS DIRECTED. 240 mL 12   SPIRIVA RESPIMAT 1.25 MCG/ACT AERS INHALE 2 PUFFS BY MOUTH EVERY DAY 4 g 11   spironolactone (ALDACTONE) 25 MG tablet Take 0.5 tablets (12.5 mg total) by mouth daily. 45 tablet 3   No facility-administered medications prior to visit.    Allergies  Allergen Reactions   Butorphanol Tartrate Other (See Comments)    REACTION: hallucinations   Hydrocodone Other (See Comments)    HALLUCINATIONS   Roxicet [Oxycodone-Acetaminophen] Other (See Comments)    HALLUCINATIONS   Doxycycline     DOES NOT WORK FOR PATIENT   Penicillins Hives    Occurred at age 31/CHILDHOOD ALLERGY Has patient had a PCN reaction causing immediate rash, facial/tongue/throat swelling, SOB or lightheadedness with hypotension: Unknown Has patient had a PCN reaction causing severe rash involving mucus membranes or skin necrosis: Unknown Has patient had a PCN reaction that required hospitalization: Unknown Has patient had a PCN reaction occurring within the last 10 years: No If all of the above answers are "NO", then may proceed with Cephalosporin use.     ROS Review of Systems  Constitutional: Negative.   Eyes:  Positive for discharge, redness and itching. Negative for photophobia, pain and visual disturbance.  Respiratory: Negative.    Cardiovascular: Negative.      Objective:    Physical Exam Vitals and nursing note reviewed.  Constitutional:      General: She is not in acute distress.    Appearance: Normal appearance. She is not ill-appearing,  toxic-appearing or diaphoretic.  HENT:     Head: Normocephalic and atraumatic.     Right Ear: External ear normal.     Left Ear: External ear normal.  Eyes:     General: Lids are normal. No allergic shiner.       Right eye: Discharge (watery) present. No hordeolum.        Left eye: Discharge (watery) present.No hordeolum.     Extraocular Movements: Extraocular movements intact.     Conjunctiva/sclera:     Right eye: Right conjunctiva is injected. Chemosis present. No exudate or hemorrhage.    Left eye: Left conjunctiva is injected. Chemosis present. No exudate or hemorrhage. Pulmonary:     Effort: Pulmonary effort is normal.  Skin:    General: Skin is warm and dry.  Neurological:     Mental Status: She is alert and oriented to person, place, and time.  Psychiatric:        Mood and Affect: Mood normal.        Behavior: Behavior normal.    BP 130/68   Pulse 74   Temp (!) 97.4 F (36.3 C) (Temporal)   Wt 171 lb (77.6 kg)   SpO2 96%   BMI 29.35 kg/m  Wt Readings from Last 3 Encounters:  08/16/20 171 lb (77.6 kg)  07/07/20 185 lb 6.4 oz (84.1 kg)  03/11/20 191 lb 6.4 oz (86.8 kg)     Health Maintenance Due  Topic Date Due   MAMMOGRAM  01/26/2018   PAP SMEAR-Modifier  01/17/2019   OPHTHALMOLOGY EXAM  01/23/2019   FOOT EXAM  03/26/2019   COVID-19 Vaccine (3 - Booster for Pfizer series) 11/03/2019   URINE MICROALBUMIN  09/04/2020    There are no preventive care reminders to display for this patient.  Lab Results  Component Value Date  TSH 1.65 09/12/2019   Lab Results  Component Value Date   WBC 4.5 03/04/2019   HGB 11.6 (L) 03/04/2019   HCT 35.0 (L) 03/04/2019   MCV 83.1 03/04/2019   PLT 169.0 03/04/2019   Lab Results  Component Value Date   NA 139 03/11/2020   K 3.7 03/11/2020   CO2 28 03/11/2020   GLUCOSE 140 (H) 03/11/2020   BUN 14 03/11/2020   CREATININE 0.71 03/11/2020   BILITOT 0.5 03/04/2019   ALKPHOS 53 03/04/2019   AST 17 03/04/2019   ALT  14 03/04/2019   PROT 7.1 03/04/2019   ALBUMIN 4.2 03/04/2019   CALCIUM 9.5 03/11/2020   ANIONGAP 8 11/26/2017   GFR 91.76 03/11/2020   Lab Results  Component Value Date   CHOL 140 03/11/2020   Lab Results  Component Value Date   HDL 62.80 03/11/2020   Lab Results  Component Value Date   LDLCALC 62 03/11/2020   Lab Results  Component Value Date   TRIG 75.0 03/11/2020   Lab Results  Component Value Date   CHOLHDL 2 03/11/2020   Lab Results  Component Value Date   HGBA1C 8.0 (H) 03/11/2020      Assessment & Plan:   Problem List Items Addressed This Visit       Other   Acute atopic conjunctivitis of both eyes - Primary   Relevant Medications   tobramycin-dexamethasone (TOBRADEX) ophthalmic solution   Chemosis of conjunctiva of both eyes   Relevant Medications   tobramycin-dexamethasone (TOBRADEX) ophthalmic solution    Meds ordered this encounter  Medications   tobramycin-dexamethasone (TOBRADEX) ophthalmic solution    Sig: Place 2 drops into both eyes every 6 (six) hours.    Dispense:  5 mL    Refill:  0    Follow-up: Return in about 3 days (around 08/19/2020).   Patient will follow-up with her ophthalmologist on Wednesday if her eyes are not improved.   Mliss Sax, MD

## 2020-08-21 ENCOUNTER — Encounter: Payer: Self-pay | Admitting: Family Medicine

## 2020-08-23 ENCOUNTER — Other Ambulatory Visit: Payer: Self-pay | Admitting: Obstetrics and Gynecology

## 2020-08-23 DIAGNOSIS — R928 Other abnormal and inconclusive findings on diagnostic imaging of breast: Secondary | ICD-10-CM

## 2020-08-25 NOTE — Telephone Encounter (Signed)
HM updated with information. Dm/cma

## 2020-08-30 ENCOUNTER — Ambulatory Visit: Payer: BC Managed Care – PPO | Admitting: Pulmonary Disease

## 2020-08-30 ENCOUNTER — Other Ambulatory Visit: Payer: Self-pay

## 2020-08-30 ENCOUNTER — Ambulatory Visit (INDEPENDENT_AMBULATORY_CARE_PROVIDER_SITE_OTHER): Payer: BC Managed Care – PPO

## 2020-08-30 ENCOUNTER — Encounter: Payer: Self-pay | Admitting: Pulmonary Disease

## 2020-08-30 VITALS — BP 126/74 | HR 72 | Temp 98.2°F | Ht 64.0 in | Wt 168.8 lb

## 2020-08-30 DIAGNOSIS — J4551 Severe persistent asthma with (acute) exacerbation: Secondary | ICD-10-CM | POA: Diagnosis not present

## 2020-08-30 DIAGNOSIS — J471 Bronchiectasis with (acute) exacerbation: Secondary | ICD-10-CM | POA: Diagnosis not present

## 2020-08-30 MED ORDER — PREDNISONE 10 MG PO TABS: ORAL_TABLET | ORAL | 0 refills | Status: AC

## 2020-08-30 NOTE — Patient Instructions (Addendum)
We will get a chest x-ray today since you are continuing to have cough and congestion Check sputum for regular cultures and AFB Prednisone taper starting at 60 mg.  Reduce dose to 10 mg every 2 days  Continue your inhalers, Singulair and Dupixent therapy at home Follow-up in 3 months,

## 2020-08-30 NOTE — Progress Notes (Signed)
Karen Barnes    [id number]    [birth year]  Primary Care Physician:Kremer, Talmadge Coventry, MD  Referring Physician: Overton Mam, DO [address] [place],  [place] [address]  Chief complaint:   Follow-up for asthma, bronchiectasis  HPI: 60 year old with history of asthma, bronchiectasis immunotherapy with Dupixent started in 2019 Maintained on Symbicort, Spiriva. She has severe GERD and is followed by Dr. Ewing Schlein.  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  She is doing person classes at present.  Developed COVID-19 in December 2020.  She was treated with monoclonal antibody as an outpatient and did not require hospitalization.  Overall she recovered well with chest x-ray last month showing clear lungs  She gets regular allergy shots with Dr. Madie Reno  Interim history: Continues with Dupixent therapy and inhalers, Singulair  Has a prolonged bronchitis, asthma exacerbation requiring 3 rounds of Levaquin and prednisone.  She felt temporarily good but is starting to have symptoms again after recent trip to the beach  Reports her latest worsening of symptoms occurred after she was cleaning a dusty attic.  Has cough with mucus production.  Denies any fevers, chills  She is allergic to penicillins and doxycycline, macrolides have been ineffective in the past  Outpatient Encounter Medications as of 08/30/2020  Medication Sig   albuterol (VENTOLIN HFA) 108 (90 Base) MCG/ACT inhaler Inhale 2 puffs into the lungs every 6 (six) hours as needed.   aspirin EC 81 MG tablet Take 81 mg by mouth daily.   budesonide-formoterol (SYMBICORT) 160-4.5 MCG/ACT inhaler Inhale 2 puffs into the lungs 2 (two) times daily.   Calcium Carbonate-Vitamin D 600-200 MG-UNIT TABS Take 1 tablet by mouth daily.   Continuous Blood Gluc Sensor (FREESTYLE LIBRE 14 DAY SENSOR) MISC USE AS DIRECTED   DUPIXENT 300 MG/2ML prefilled syringe INJECT 1 SYRINGE  UNDER THE SKIN EVERY OTHER WEEK   EPINEPHrine 0.3 mg/0.3 mL IJ SOAJ injection Inject 0.3 mg into the muscle as directed. For immunotherapy (allergy shots) anaphylactic reactions   fluticasone (FLONASE) 50 MCG/ACT nasal spray Place 2 sprays into both nostrils 2 (two) times daily.    furosemide (LASIX) 20 MG tablet TAKE 1 TABLET(20 MG) BY MOUTH DAILY AS NEEDED FOR FLUID RETENTION OR SWELLING   metFORMIN (GLUCOPHAGE) 1000 MG tablet TAKE 1 TABLET(1000 MG) BY MOUTH TWICE DAILY   montelukast (SINGULAIR) 10 MG tablet Take 10 mg by mouth at bedtime.   Multiple Vitamins-Minerals (MULTIVITAMIN WITH MINERALS) tablet Take 1 tablet by mouth daily.   ondansetron (ZOFRAN-ODT) 4 MG disintegrating tablet Take 1 tablet (4 mg total) by mouth every 8 (eight) hours as needed for nausea or vomiting.   pantoprazole (PROTONIX) 40 MG tablet TAKE 1 TABLET(40 MG) BY MOUTH DAILY (Patient taking differently: 2 (two) times daily. TAKE 1 TABLET(40 MG) BY MOUTH DAILY)   rosuvastatin (CRESTOR) 5 MG tablet TAKE 1 TABLET(5 MG) BY MOUTH DAILY   sodium chloride HYPERTONIC 3 % nebulizer solution INHALE CONTENTS OF 1 VIAL VIA NEBULIZER TWICE DAILY AS DIRECTED.   SPIRIVA RESPIMAT 1.25 MCG/ACT AERS INHALE 2 PUFFS BY MOUTH EVERY DAY   spironolactone (ALDACTONE) 25 MG tablet Take 0.5 tablets (12.5 mg total) by mouth daily.   Estradiol 10 MCG TABS vaginal tablet Place vaginally. (Patient not taking: Reported on 08/30/2020)   [DISCONTINUED] fluconazole (DIFLUCAN) 150 MG tablet Take 1 tablet (150 mg total) by mouth daily.   [DISCONTINUED] lactobacillus acidophilus (BACID) TABS tablet  Take 2 tablets by mouth 3 (three) times daily. (Patient not taking: Reported on 08/16/2020)   [DISCONTINUED] predniSONE (DELTASONE) 10 MG tablet 60mg  x's 2days,50mg x's2 days,40mg x's 2 days,30mg x2days,20mg x2days,10mg x2days, stop   [DISCONTINUED] tobramycin-dexamethasone (TOBRADEX) ophthalmic solution Place 2 drops into both eyes every 6 (six) hours.   No  facility-administered encounter medications on file as of 08/30/2020.   Physical Exam: Blood pressure 126/74, pulse 72, temperature 98.2 F (36.8 C), temperature source Oral, height 5\' 4"  (1.626 m), weight 168 lb 12.8 oz (76.6 kg), SpO2 99 %. Gen:      No acute distress HEENT:  EOMI, sclera anicteric Neck:     No masses; no thyromegaly Lungs:    Scattered expiratory wheeze CV:         Regular rate and rhythm; no murmurs Abd:      + bowel sounds; soft, non-tender; no palpable masses, no distension Ext:    No edema; adequate peripheral perfusion Skin:      Warm and dry; no rash Neuro: alert and oriented x 3 Psych: normal mood and affect   Data Reviewed: Imaging: November 07, 2017 high-resolution CT scan of the chest images : Mild cylindrical bronchiectasis particularly in the left lower lobe, some in the right lower lobe.   10/18/2015 chest CT images  showing normal pulmonary parenchyma with the exception of a 3.4 mm nodule in the left lower lobe.  Chest x-ray 06/10/2019-clear lungs   Pulmonary function test: October 2019 ratio 90%, FVC 2.56 L 76% predicted, total lung capacity 4.32 L 84% predicted, DLCO 20.7 mL 84% predicted   Labs: October 2019 complement levels within normal limits, IgG subclasses all normal, alpha-1 antitrypsin level normal, CBC showed 300 absolute eosinophil count   11/03/2015 - Ige - 32   Heart catheterization: October 2019 right heart catheterization showed mean PA pressure 24, pulmonary capillary wedge pressure 19, PVR 0.7 Woods units  Assessment:  Acute bronchitis, bronchiectasis exacerbation She has already received 3 rounds of levofloxacin.  Will avoid further antibiotics Check sputum for regular cultures, AFB Give another round of prednisone taper Chest x-ray today  Bronchiectasis Continue chest vest, Mucinex.  Hypertonic saline for ciliary clearance   Severe persistent asthma Continue Symbicort, Spiriva, Dupixent She can use over-the-counter Zyrtec  for postnasal drip Follows with Dr. 11/05/2015 for allergy shots  Plan/Recommendations: Sputum cultures, AFB, chest x-ray Prednisone taper starting at 60 mg.  Reduce dose to 10 mg every 2 days  November 2019 MD [facility] 08/30/2020, 9:55 AM  CC: [name], DO

## 2020-08-31 ENCOUNTER — Other Ambulatory Visit: Payer: BC Managed Care – PPO

## 2020-08-31 DIAGNOSIS — J471 Bronchiectasis with (acute) exacerbation: Secondary | ICD-10-CM

## 2020-09-02 ENCOUNTER — Encounter: Payer: Self-pay | Admitting: Pulmonary Disease

## 2020-09-03 ENCOUNTER — Other Ambulatory Visit: Payer: Self-pay

## 2020-09-03 ENCOUNTER — Ambulatory Visit
Admission: RE | Admit: 2020-09-03 | Discharge: 2020-09-03 | Disposition: A | Discharge status: Home / Self Care | Payer: BC Managed Care – PPO | Source: Ambulatory Visit | Attending: Obstetrics and Gynecology | Admitting: Obstetrics and Gynecology

## 2020-09-03 ENCOUNTER — Ambulatory Visit: Payer: BC Managed Care – PPO

## 2020-09-03 DIAGNOSIS — R928 Other abnormal and inconclusive findings on diagnostic imaging of breast: Secondary | ICD-10-CM

## 2020-09-03 LAB — RESPIRATORY CULTURE OR RESPIRATORY AND SPUTUM CULTURE
MICRO NUMBER:: 12164045
RESULT:: NORMAL
SPECIMEN QUALITY:: ADEQUATE

## 2020-09-07 ENCOUNTER — Other Ambulatory Visit: Payer: BC Managed Care – PPO

## 2020-09-08 ENCOUNTER — Encounter: Payer: BC Managed Care – PPO | Admitting: Family Medicine

## 2020-09-10 ENCOUNTER — Ambulatory Visit: Payer: BC Managed Care – PPO | Admitting: Internal Medicine

## 2020-09-29 ENCOUNTER — Ambulatory Visit (INDEPENDENT_AMBULATORY_CARE_PROVIDER_SITE_OTHER): Payer: BC Managed Care – PPO | Admitting: Family Medicine

## 2020-09-29 ENCOUNTER — Encounter: Payer: Self-pay | Admitting: Family Medicine

## 2020-09-29 ENCOUNTER — Other Ambulatory Visit: Payer: Self-pay

## 2020-09-29 VITALS — BP 118/70 | HR 85 | Temp 97.0°F | Ht 64.0 in | Wt 165.6 lb

## 2020-09-29 DIAGNOSIS — E118 Type 2 diabetes mellitus with unspecified complications: Secondary | ICD-10-CM | POA: Diagnosis not present

## 2020-09-29 DIAGNOSIS — E785 Hyperlipidemia, unspecified: Secondary | ICD-10-CM | POA: Diagnosis not present

## 2020-09-29 LAB — BASIC METABOLIC PANEL
BUN: 20 mg/dL (ref 6–23)
CO2: 28 mEq/L (ref 19–32)
Calcium: 10.1 mg/dL (ref 8.4–10.5)
Chloride: 101 mEq/L (ref 96–112)
Creatinine, Ser: 0.91 mg/dL (ref 0.40–1.20)
GFR: 67.86 mL/min (ref >60.00)
Glucose, Bld: 112 mg/dL — ABNORMAL HIGH (ref 70–99)
Potassium: 4.2 mEq/L (ref 3.5–5.1)
Sodium: 141 mEq/L (ref 135–145)

## 2020-09-29 LAB — HEMOGLOBIN A1C: Hgb A1c MFr Bld: 6.7 % — ABNORMAL HIGH (ref 4.6–6.5)

## 2020-09-29 LAB — LDL CHOLESTEROL, DIRECT: Direct LDL: 54 mg/dL

## 2020-09-29 NOTE — Progress Notes (Addendum)
Established Patient Office Visit  Subjective:  Patient ID: Karen Barnes, female    DOB: Aug 27, 1960  Age: 60 y.o. MRN: [id number]  CC:  Chief Complaint  Patient presents with   Establish Care    TOC- CPE/LABS.   Not fasting this morning. Wants updated new Libre meter.    HPI Karen Barnes presents for follow-up of her diabetes.  She is recently obtained a 30 pound weight loss with the Optivia diet.  Average blood sugars have been 127.  She is taking 1000 of Glucophage twice daily.  Low-dose rosuvastatin for control of her LDL cholesterol.  Status post recent Pap smear, mammogram and dilated eye exam.  She will upload these into MyChart.  Past Medical History:  Diagnosis Date   Asthma, moderate persistent    sees Dr Irena Cords   Atrial tachycardia [facility])    Chronic diastolic CHF (congestive heart failure) (HCC)    COVID-19 virus infection 01/2019   Diabetes mellitus    A1C 6.2---->  2009   Family history of adverse reaction to anesthesia    sister had cardiac arrest with rhinoplasty   Fibrocystic breast    GERD (gastroesophageal reflux disease)    HH (hiatus hernia) 09/2014   Dr. Ewing Schlein   History of echocardiogram    Echo 5/19: EF 55-60, normal wall motion, PASP 41   History of hiatal hernia    History of nuclear stress test    Nuclear stress test 5/19:  EF 64, normal perfusion, Low risk   Hot flashes    Hyperlipidemia    Premature atrial contractions    Pulmonary hypertension (HCC)    PVC's (premature ventricular contractions)    Recurrent cystitis    took  postcoital antibiotic for years, symptoms better after her hysterectomy 1996, symptoms re-surface in 2011   RUQ abdominal pain 2016   EGD showed small HH, otherwise normal   Wears glasses     Past Surgical History:  Procedure Laterality Date   ABDOMINAL HYSTERECTOMY  1996   no oophorectomy, d/tendometriosis and fibroids approximately 1996   BREAST EXCISIONAL BIOPSY Right 03/12/2014   [facility]   BREAST  LUMPECTOMY WITH RADIOACTIVE SEED LOCALIZATION Right 03/12/2014   Procedure: BREAST LUMPECTOMY WITH RADIOACTIVE SEED LOCALIZATION;  Surgeon: Harriette Bouillon, MD;  Location: [facility];  Service: General;  Laterality: Right;   BREAST SURGERY     Lumpectomy-- BENIGN    CATARACT EXTRACTION Bilateral 2011   CESAREAN SECTION     COSMETIC SURGERY  01/2017   [place]isa Touch w/ GYN   DILATION AND CURETTAGE OF UTERUS     LIPOMA EXCISION  1978   RIGHT HEART CATH N/A 11/26/2017   Procedure: RIGHT HEART CATH;  Surgeon: Laurey Morale, MD;  Location[facility] INVASIVE CV LAB;  Service: Cardiovascular;  Laterality: N/A;   UPPER GASTROINTESTINAL ENDOSCOPY     UPPER GI ENDOSCOPY  09/11/2014   Small Hiatus Hernia, Dr. Ewing Schlein    Family History  Problem Relation Age of Onset   Mitral valve prolapse Mother    Breast cancer Mother 70       M had ca insitu (age 4), aunt    Dementia Mother    Heart disease Father 24       early    Diabetes Father    Kidney failure Father    Sleep apnea Father    Brain cancer Other        2 fam members    Lung cancer Other  uncle, heavy smoker   Breast cancer Maternal Aunt 45   CAD Paternal Grandmother 55   CAD Paternal Grandfather 19   Colon cancer Neg Hx     Social History   Socioeconomic History   Marital status: Married    Spouse name: Not on file   Number of children: 1   Years of education: College   Highest education level: Not on file  Occupational History   Occupation: retired principal  Tobacco Use   Smoking status: Never   Smokeless tobacco: Never  Vaping Use   Vaping Use: Never used  Substance and Sexual Activity   Alcohol use: No   Drug use: No   Sexual activity: Yes  Other Topics Concern   Not on file  Social History Narrative   Household-- pt and husband    Rare caffeine use   Daughter independent, 1 G-child   Engineer, structural for F and M, both w/ dementia   Social Determinants of Health   Financial Resource Strain: Not on  file  Food Insecurity: Not on file  Transportation Needs: Not on file  Physical Activity: Not on file  Stress: Not on file  Social Connections: Not on file  Intimate Partner Violence: Not on file    Outpatient Medications Prior to Visit  Medication Sig Dispense Refill   albuterol (VENTOLIN HFA) 108 (90 Base) MCG/ACT inhaler Inhale 2 puffs into the lungs every 6 (six) hours as needed. 8 g 2   aspirin EC 81 MG tablet Take 81 mg by mouth daily.     budesonide-formoterol (SYMBICORT) 160-4.5 MCG/ACT inhaler Inhale 2 puffs into the lungs 2 (two) times daily.     Calcium Carbonate-Vitamin D 600-200 MG-UNIT TABS Take 1 tablet by mouth daily.     Continuous Blood Gluc Sensor (FREESTYLE LIBRE 14 DAY SENSOR) MISC USE AS DIRECTED 3 each 3   DUPIXENT 300 MG/2ML prefilled syringe INJECT 1 SYRINGE UNDER THE SKIN EVERY OTHER WEEK 12 mL 10   EPINEPHrine 0.3 mg/0.3 mL IJ SOAJ injection Inject 0.3 mg into the muscle as directed. For immunotherapy (allergy shots) anaphylactic reactions     Estradiol 10 MCG TABS vaginal tablet Place vaginally.     fluticasone (FLONASE) 50 MCG/ACT nasal spray Place 2 sprays into both nostrils 2 (two) times daily.      furosemide (LASIX) 20 MG tablet TAKE 1 TABLET(20 MG) BY MOUTH DAILY AS NEEDED FOR FLUID RETENTION OR SWELLING 30 tablet 8   montelukast (SINGULAIR) 10 MG tablet Take 10 mg by mouth at bedtime.     Multiple Vitamins-Minerals (MULTIVITAMIN WITH MINERALS) tablet Take 1 tablet by mouth daily.     ondansetron (ZOFRAN-ODT) 4 MG disintegrating tablet Take 1 tablet (4 mg total) by mouth every 8 (eight) hours as needed for nausea or vomiting. 20 tablet 0   pantoprazole (PROTONIX) 40 MG tablet TAKE 1 TABLET(40 MG) BY MOUTH DAILY (Patient taking differently: 2 (two) times daily. TAKE 1 TABLET(40 MG) BY MOUTH DAILY) 90 tablet 3   rosuvastatin (CRESTOR) 5 MG tablet TAKE 1 TABLET(5 MG) BY MOUTH DAILY 90 tablet 3   sodium chloride HYPERTONIC 3 % nebulizer solution INHALE CONTENTS  OF 1 VIAL VIA NEBULIZER TWICE DAILY AS DIRECTED. 240 mL 12   SPIRIVA RESPIMAT 1.25 MCG/ACT AERS INHALE 2 PUFFS BY MOUTH EVERY DAY 4 g 11   spironolactone (ALDACTONE) 25 MG tablet Take 0.5 tablets (12.5 mg total) by mouth daily. 45 tablet 3   metFORMIN (GLUCOPHAGE) 1000 MG tablet TAKE 1 TABLET(1000 MG)  BY MOUTH TWICE DAILY 180 tablet 3   Probiotic Product (ALIGN) 4 MG CAPS      No facility-administered medications prior to visit.    Allergies  Allergen Reactions   Butorphanol Tartrate Other (See Comments)    REACTION: hallucinations   Hydrocodone Other (See Comments)    HALLUCINATIONS   Roxicet [Oxycodone-Acetaminophen] Other (See Comments)    HALLUCINATIONS   Doxycycline     DOES NOT WORK FOR PATIENT   Penicillins Hives    Occurred at age 69/CHILDHOOD ALLERGY Has patient had a PCN reaction causing immediate rash, facial/tongue/throat swelling, SOB or lightheadedness with hypotension: Unknown Has patient had a PCN reaction causing severe rash involving mucus membranes or skin necrosis: Unknown Has patient had a PCN reaction that required hospitalization: Unknown Has patient had a PCN reaction occurring within the last 10 years: No If all of the above answers are "NO", then may proceed with Cephalosporin use.     ROS Review of Systems  Constitutional:  Negative for diaphoresis, fatigue, fever and unexpected weight change.  HENT: Negative.    Eyes:  Negative for photophobia and visual disturbance.  Respiratory: Negative.    Cardiovascular: Negative.   Gastrointestinal: Negative.   Endocrine: Negative for polyphagia and polyuria.  Genitourinary: Negative.   Musculoskeletal:  Negative for gait problem and joint swelling.  Skin: Negative.   Neurological:  Negative for speech difficulty and weakness.  Psychiatric/Behavioral: Negative.       Objective:    Physical Exam Vitals and nursing note reviewed.  Constitutional:      General: She is not in acute distress.     Appearance: Normal appearance. She is not ill-appearing, toxic-appearing or diaphoretic.  HENT:     Head: Normocephalic and atraumatic.     Right Ear: Tympanic membrane, ear canal and external ear normal.     Left Ear: Tympanic membrane, ear canal and external ear normal.     Mouth/Throat:     Mouth: Mucous membranes are moist.     Pharynx: Oropharynx is clear. No oropharyngeal exudate or posterior oropharyngeal erythema.  Eyes:     General: No scleral icterus.       Right eye: No discharge.        Left eye: No discharge.     Extraocular Movements: Extraocular movements intact.     Conjunctiva/sclera: Conjunctivae normal.     Pupils: Pupils are equal, round, and reactive to light.  Neck:     Vascular: No carotid bruit.  Cardiovascular:     Rate and Rhythm: Normal rate and regular rhythm.     Pulses:          Dorsalis pedis pulses are 2+ on the right side and 2+ on the left side.       Posterior tibial pulses are 2+ on the right side and 2+ on the left side.  Pulmonary:     Effort: Pulmonary effort is normal.     Breath sounds: Normal breath sounds.  Abdominal:     General: Bowel sounds are normal.  Musculoskeletal:     Cervical back: No rigidity or tenderness.     Right lower leg: No edema.     Left lower leg: No edema.  Lymphadenopathy:     Cervical: No cervical adenopathy.  Skin:    General: Skin is warm and dry.  Neurological:     Mental Status: She is alert and oriented to person, place, and time.  Psychiatric:        Mood  and Affect: Mood normal.        Behavior: Behavior normal.    BP 118/70 (BP Location: Left Arm, Patient Position: Sitting, Cuff Size: Normal)   Pulse 85   Temp (!) 97 F (36.1 C) (Temporal)   Ht 5\' 4"  (1.626 m)   Wt 165 lb 9.6 oz (75.1 kg)   SpO2 99%   BMI 28.43 kg/m  Wt Readings from Last 3 Encounters:  09/29/20 165 lb 9.6 oz (75.1 kg)  08/30/20 168 lb 12.8 oz (76.6 kg)  08/16/20 171 lb (77.6 kg)     Health Maintenance Due  Topic Date  Due   PAP SMEAR-Modifier  01/17/2019   OPHTHALMOLOGY EXAM  01/23/2019   COVID-19 Vaccine (4 - Booster for Pfizer series) 04/05/2020   MAMMOGRAM  09/19/2020   URINE MICROALBUMIN  09/04/2020   INFLUENZA VACCINE  09/06/2020    There are no preventive care reminders to display for this patient.  Lab Results  Component Value Date   TSH 1.65 09/12/2019   Lab Results  Component Value Date   WBC 4.5 03/04/2019   HGB 11.6 (L) 03/04/2019   HCT 35.0 (L) 03/04/2019   MCV 83.1 03/04/2019   PLT 169.0 03/04/2019   Lab Results  Component Value Date   NA 141 09/29/2020   K 4.2 09/29/2020   CO2 28 09/29/2020   GLUCOSE 112 (H) 09/29/2020   BUN 20 09/29/2020   CREATININE 0.91 09/29/2020   BILITOT 0.5 03/04/2019   ALKPHOS 53 03/04/2019   AST 17 03/04/2019   ALT 14 03/04/2019   PROT 7.1 03/04/2019   ALBUMIN 4.2 03/04/2019   CALCIUM 10.1 09/29/2020   ANIONGAP 8 11/26/2017   GFR 67.86 09/29/2020   Lab Results  Component Value Date   CHOL 140 03/11/2020   Lab Results  Component Value Date   HDL 62.80 03/11/2020   Lab Results  Component Value Date   LDLCALC 62 03/11/2020   Lab Results  Component Value Date   TRIG 75.0 03/11/2020   Lab Results  Component Value Date   CHOLHDL 2 03/11/2020   Lab Results  Component Value Date   HGBA1C 6.7 (H) 09/29/2020      Assessment & Plan:   Problem List Items Addressed This Visit       Endocrine   Type 2 diabetes mellitus with complication (HCC)   Relevant Medications   metFORMIN (GLUCOPHAGE XR) 500 MG 24 hr tablet   Other Relevant Orders   Basic metabolic panel (Completed)   Hemoglobin A1c (Completed)     Other   Hyperlipidemia - Primary   Relevant Orders   LDL cholesterol, direct (Completed)    Meds ordered this encounter  Medications   metFORMIN (GLUCOPHAGE XR) 500 MG 24 hr tablet    Sig: Take one tablet nightly.    Dispense:  90 tablet    Refill:  1    Discontinue glucophage 1000mg  bid.     Follow-up: Return  in about 6 months (around 04/01/2021), or Congratulations on weight loss and keep going.  No need for continuous glucose..  Expecting to be able to decrease metformin dosage hopefully from 1000 twice daily to 500 twice daily with patient's weight loss and likely improvement in hemoglobin A1c.  , MD

## 2020-09-30 MED ORDER — METFORMIN HCL ER 500 MG PO TB24: ORAL_TABLET | ORAL | 1 refills | Status: DC

## 2020-09-30 NOTE — Addendum Note (Signed)
Addended by: Andrez Grime on: 09/30/2020 08:06 AM   Modules accepted: Orders

## 2020-10-18 NOTE — Telephone Encounter (Signed)
please advise on patient mychart message. Thank you    Hi Karen Barnes.  I just noticed that my Auvi-Q injector is up for refill.  Now that Dr. Isaiah Serge is taking over my care and I am not seeing Dr. Irena Cords at [facility], I need to have this refilled through you guys.     Previously this was filled through [facility] [phone] Fax [phone] [address] [place], [place] [address]   Let me know if you need anything else from me.  I have seen Dr. Isaiah Serge recently and am scheduled for a three month follow up on October 20.     Karen Barnes

## 2020-10-19 ENCOUNTER — Other Ambulatory Visit (HOSPITAL_COMMUNITY): Payer: Self-pay

## 2020-10-19 MED ORDER — EPINEPHRINE 0.3 MG/0.3ML IJ SOAJ: 0.3000 mg | Freq: Once | INTRAMUSCULAR | 5 refills | Status: DC

## 2020-10-26 LAB — AFB CULTURE WITH SMEAR (NOT AT ARMC)
Acid Fast Culture: NEGATIVE
Acid Fast Smear: NEGATIVE

## 2020-10-28 ENCOUNTER — Other Ambulatory Visit: Payer: Self-pay | Admitting: Pulmonary Disease

## 2020-10-28 NOTE — Telephone Encounter (Signed)
What do you want to switch medication to? Please advise.  Thanks

## 2020-10-28 NOTE — Telephone Encounter (Signed)
Please contact patient's primary pulmonologist regarding hypertonic saline

## 2020-11-04 ENCOUNTER — Telehealth: Payer: BC Managed Care – PPO | Admitting: Physician Assistant

## 2020-11-04 DIAGNOSIS — H539 Unspecified visual disturbance: Secondary | ICD-10-CM

## 2020-11-04 NOTE — Progress Notes (Signed)
Based on what you shared with me, I feel your condition warrants further evaluation and I recommend that you be seen in a face to face visit.  A lot of the initial symptoms you listed seem consistent with an allergic conjunctivitis but giving the mention of change in vision even after clearing eyes, you need to be seen in person for an eye examination as this is not a common symptom of a conjunctivitis and we need to make sure nothing else is going on.    NOTE: There will be NO CHARGE for this eVisit   If you are having a true medical emergency please call 911.      For an urgent face to face visit, [facility] has six urgent care centers for your convenience:     [facility] at [facility] Directions [phone] [address] [place], [place] [address]    [facility] Center [facility]) Get Driving Directions [phone] [address] [place], [place] [address]  [facility]) Get Driving Directions [phone] [address] [place],  [place]  [address]  [facility]gent [facility] at [facility] Get Driving Directions [phone] [address] [place], [place] [address]   [facility] Health Urgent Care at [facility] Get Driving Directions  [phone] [address] [place], [place] [address]   [facility] Health Urgent Care at [facility] Directions [phone] [address] [place], [place] [address]  Your MyChart E-visit questionnaire answers were reviewed by a board certified advanced clinical practitioner to complete your personal care plan based on your specific symptoms.  Thank you for using e-Visits.

## 2020-11-08 ENCOUNTER — Ambulatory Visit: Payer: BC Managed Care – PPO | Admitting: Internal Medicine

## 2020-11-08 ENCOUNTER — Encounter: Payer: Self-pay | Admitting: Internal Medicine

## 2020-11-08 ENCOUNTER — Other Ambulatory Visit: Payer: Self-pay

## 2020-11-08 VITALS — BP 120/74 | HR 65 | Ht 64.0 in | Wt 166.6 lb

## 2020-11-08 DIAGNOSIS — E042 Nontoxic multinodular goiter: Secondary | ICD-10-CM | POA: Diagnosis not present

## 2020-11-08 LAB — T4, FREE: Free T4: 0.77 ng/dL (ref 0.60–1.60)

## 2020-11-08 LAB — TSH: TSH: 1.46 u[IU]/mL (ref 0.35–5.50)

## 2020-11-08 NOTE — Progress Notes (Signed)
Name: Karen Barnes  MRN/ DOB: [id number], 04-28-1960    Age/ Sex: 60 y.o., female     PCP: Mliss Sax, MD   Reason for Endocrinology Evaluation: Thyroid nodule      Initial Endocrinology Clinic Visit: 12/28/2017    PATIENT IDENTIFIER: Karen Barnes is a 60 y.o., female with a past medical history of T2DM, Asthma, GERD,Thyroid nodule and bronchiectasis. She has followed with [facility] since 12/28/2017 for consultative assistance with management of her thyroid nodule.   HISTORICAL SUMMARY: The patient was first diagnosed with right thyroid nodule in 11/2017 during CT scan of the chest at was performed for evaluation of cough.  S/P FNA with benign cytology in October, 2019  Per patient she has had intermittently low TSH.   SUBJECTIVE:   Today (11/08/2020):  Karen Barnes is here for a follow up on right thyroid nodule.    Has chronic cough due to bronchiectasis.  Uses vest therapy  Has been noted with weight loss - on weight waters  Denies local neck swelling  Continues with occasional palpitations Diarrhea resolved  Denies tremors      HISTORY:  Past Medical History:  Past Medical History:  Diagnosis Date   Asthma, moderate persistent    sees Dr Irena Cords   Atrial tachycardia [facility])    Chronic diastolic CHF (congestive heart failure) (HCC)    COVID-19 virus infection 01/2019   Diabetes mellitus    A1C 6.2---->  2009   Family history of adverse reaction to anesthesia    sister had cardiac arrest with rhinoplasty   Fibrocystic breast    GERD (gastroesophageal reflux disease)    HH (hiatus hernia) 09/2014   Dr. Ewing Schlein   History of echocardiogram    Echo 5/19: EF 55-60, normal wall motion, PASP 41   History of hiatal hernia    History of nuclear stress test    Nuclear stress test 5/19:  EF 64, normal perfusion, Low risk   Hot flashes    Hyperlipidemia    Premature atrial contractions    Pulmonary hypertension (HCC)    PVC's  (premature ventricular contractions)    Recurrent cystitis    took  postcoital antibiotic for years, symptoms better after her hysterectomy 1996, symptoms re-surface in 2011   RUQ abdominal pain 2016   EGD showed small HH, otherwise normal   Wears glasses    Past Surgical History:  Past Surgical History:  Procedure Laterality Date   ABDOMINAL HYSTERECTOMY  1996   no oophorectomy, d/tendometriosis and fibroids approximately 1996   BREAST EXCISIONAL BIOPSY Right 03/12/2014   [facility]   BREAST LUMPECTOMY WITH RADIOACTIVE SEED LOCALIZATION Right 03/12/2014   Procedure: BREAST LUMPECTOMY WITH RADIOACTIVE SEED LOCALIZATION;  Surgeon: Harriette Bouillon, MD;  Location: [facility];  Service: General;  Laterality: Right;   BREAST SURGERY     Lumpectomy-- BENIGN    CATARACT EXTRACTION Bilateral 2011   CESAREAN SECTION     COSMETIC SURGERY  01/2017   MonaLisa Touch w/ GYN   DILATION AND CURETTAGE OF UTERUS     LIPOMA EXCISION  1978   RIGHT HEART CATH N/A 11/26/2017   Procedure: RIGHT HEART CATH;  Surgeon: Laurey Morale, MD;  Location: [facility] INVASIVE CV LAB;  Service: Cardiovascular;  Laterality: N/A;   UPPER GASTROINTESTINAL ENDOSCOPY     UPPER GI ENDOSCOPY  09/11/2014   Small Hiatus Hernia, Dr. Ewing Schlein   Social History:  reports that she has never smoked. She  has never used smokeless tobacco. She reports that she does not drink alcohol and does not use drugs. Family History:  Family History  Problem Relation Age of Onset   Mitral valve prolapse Mother    Breast cancer Mother 18       M had ca insitu (age 25), aunt    Dementia Mother    Heart disease Father 38       early    Diabetes Father    Kidney failure Father    Sleep apnea Father    Brain cancer Other        2 fam members    Lung cancer Other        uncle, heavy smoker   Breast cancer Maternal Aunt 70   CAD Paternal Grandmother 88   CAD Paternal Grandfather 22   Colon cancer Neg Hx      HOME MEDICATIONS: Allergies  as of 11/08/2020       Reactions   Butorphanol Tartrate Other (See Comments)   REACTION: hallucinations   Hydrocodone Other (See Comments)   HALLUCINATIONS   Roxicet [oxycodone-acetaminophen] Other (See Comments)   HALLUCINATIONS   Doxycycline    DOES NOT WORK FOR PATIENT   Penicillins Hives   Occurred at age 59/CHILDHOOD ALLERGY Has patient had a PCN reaction causing immediate rash, facial/tongue/throat swelling, SOB or lightheadedness with hypotension: Unknown Has patient had a PCN reaction causing severe rash involving mucus membranes or skin necrosis: Unknown Has patient had a PCN reaction that required hospitalization: Unknown Has patient had a PCN reaction occurring within the last 10 years: No If all of the above answers are "NO", then may proceed with Cephalosporin use.        Medication List        Accurate as of November 08, 2020 10:22 AM. If you have any questions, ask your nurse or doctor.          albuterol 108 (90 Base) MCG/ACT inhaler Commonly known as: VENTOLIN HFA Inhale 2 puffs into the lungs every 6 (six) hours as needed.   Align 4 MG Caps   aspirin EC 81 MG tablet Take 81 mg by mouth daily.   budesonide-formoterol 160-4.5 MCG/ACT inhaler Commonly known as: SYMBICORT Inhale 2 puffs into the lungs 2 (two) times daily.   Calcium Carbonate-Vitamin D 600-200 MG-UNIT Tabs Take 1 tablet by mouth daily.   Dupixent 300 MG/2ML prefilled syringe Generic drug: dupilumab INJECT 1 SYRINGE UNDER THE SKIN EVERY OTHER WEEK   EPINEPHrine 0.3 mg/0.3 mL Soaj injection Commonly known as: EPI-PEN Inject 0.3 mg into the muscle once for 1 dose.   Estradiol 10 MCG Tabs vaginal tablet Place vaginally.   fluticasone 50 MCG/ACT nasal spray Commonly known as: FLONASE Place 2 sprays into both nostrils 2 (two) times daily.   FreeStyle Libre 14 Day Sensor Misc USE AS DIRECTED   furosemide 20 MG tablet Commonly known as: LASIX TAKE 1 TABLET(20 MG) BY MOUTH DAILY AS  NEEDED FOR FLUID RETENTION OR SWELLING   metFORMIN 500 MG 24 hr tablet Commonly known as: Glucophage XR Take one tablet nightly.   montelukast 10 MG tablet Commonly known as: SINGULAIR Take 10 mg by mouth at bedtime.   multivitamin with minerals tablet Take 1 tablet by mouth daily.   ondansetron 4 MG disintegrating tablet Commonly known as: ZOFRAN-ODT Take 1 tablet (4 mg total) by mouth every 8 (eight) hours as needed for nausea or vomiting.   pantoprazole 40 MG tablet Commonly known as:  PROTONIX TAKE 1 TABLET(40 MG) BY MOUTH DAILY What changed: when to take this   rosuvastatin 5 MG tablet Commonly known as: CRESTOR TAKE 1 TABLET(5 MG) BY MOUTH DAILY   sodium chloride HYPERTONIC 3 % nebulizer solution INHALE CONTENTS OF 1 VIAL VIA NEBULIZER TWICE DAILY AS DIRECTED.   Spiriva Respimat 1.25 MCG/ACT Aers Generic drug: Tiotropium Bromide Monohydrate INHALE 2 PUFFS BY MOUTH EVERY DAY   spironolactone 25 MG tablet Commonly known as: ALDACTONE Take 0.5 tablets (12.5 mg total) by mouth daily.          OBJECTIVE:   PHYSICAL EXAM: VS: BP 120/74 (BP Location: Left Arm, Patient Position: Sitting, Cuff Size: Small)   Pulse 65   Ht 5\' 4"  (1.626 m)   Wt 166 lb 9.6 oz (75.6 kg)   SpO2 99%   BMI 28.60 kg/m    EXAM: General: Pt appears well and is in NAD  Neck: General: Supple without adenopathy. Thyroid:  No goiter or nodules appreciated.   Lungs: Clear with good BS bilat with no rales, rhonchi, or wheezes  Heart: Auscultation: RRR.  Abdomen: Normoactive bowel sounds, soft, nontender, without masses or organomegaly palpable  Extremities:  BL LE: No pretibial edema normal ROM and strength.  Mental Status: Judgment, insight: Intact Orientation: Oriented to time, place, and person Mood and affect: No depression, anxiety, or agitation     DATA REVIEWED:  Results for Karen Barnes, Karen Barnes (MRN Julieanne Cotton) as of 11/08/2020 13:14  Ref. Range 11/08/2020 10:27  TSH Latest Ref  Range: 0.35 - 5.50 uIU/mL 1.46  T4,Free(Direct) Latest Ref Range: 0.60 - 1.60 ng/dL 01/08/2021    Thyroid ultrasound 09/02/2019  The previously biopsied right posteroinferior nodule measures 1.4 x 0.7 x 1.2 cm, previously 1.3 x 0.7 x 0.9 cm. No significant interval change. Correlate with prior pathology.   There are additional subcentimeter right thyroid hypoechoic nodules noted all measuring 7 mm or less in size. These would not meet criteria for any biopsy or follow-up and are not fully described by TI rads criteria.   Normal vascularity. Minor thyroid heterogeneity. No significant left thyroid abnormality.   Mildly prominent left cervical lymph node with a short axis measurement of 1 cm. No other enlarged lymph nodes.   IMPRESSION: Stable 1.4 cm right posteroinferior thyroid nodule previously biopsied. Correlate with prior pathology.   No new or enlarging nodule   Nonspecific 1 cm left cervical lymph node.    FNA 12/04/2017  THYROID, FINE NEEDLE ASPIRATION RIGHT LOBE, RIGHT MID POLE (SPECIMEN 1 OF 1 COLLECTED 12/04/17) CONSISTENT WITH BENIGN FOLLICULAR NODULE (BETHESDA CATEGORY II).  ASSESSMENT / PLAN / RECOMMENDATIONS:   Multinodular Goiter:    - She is clinically euthyroid.  - TFT's normal  - S/P FNA of the right thyroid nodule in October,2019 with benign cytology.  - Repeat ultrasound in 08/2019 assures stability , will repeat again this year       F/u in 1 yr   Signed electronically by: 09/2019, MD  [facility]  [facility] [address] [place] [place], [place] Phone: [phone] FAX: [phone]      CC: [phone], MD [address] [place] Phone: [phone]  Fax: [phone]   Return to Endocrinology clinic as below: Future Appointments  Date Time Provider Department Center  11/25/2020  8:45 AM 11/27/2020, MD [id number] None  04/01/2021  8:00 AM  04/03/2021, MD [id number]

## 2020-11-22 ENCOUNTER — Other Ambulatory Visit: Payer: Self-pay

## 2020-11-22 DIAGNOSIS — U099 Post covid-19 condition, unspecified: Secondary | ICD-10-CM

## 2020-11-22 DIAGNOSIS — I5032 Chronic diastolic (congestive) heart failure: Secondary | ICD-10-CM

## 2020-11-22 DIAGNOSIS — R002 Palpitations: Secondary | ICD-10-CM

## 2020-11-22 DIAGNOSIS — I10 Essential (primary) hypertension: Secondary | ICD-10-CM

## 2020-11-22 DIAGNOSIS — I272 Pulmonary hypertension, unspecified: Secondary | ICD-10-CM

## 2020-11-22 DIAGNOSIS — E785 Hyperlipidemia, unspecified: Secondary | ICD-10-CM

## 2020-11-22 MED ORDER — FUROSEMIDE 20 MG PO TABS: ORAL_TABLET | ORAL | 0 refills | Status: DC

## 2020-11-22 MED ORDER — SPIRONOLACTONE 25 MG PO TABS: 12.5000 mg | ORAL_TABLET | Freq: Every day | ORAL | 0 refills | Status: DC

## 2020-11-25 ENCOUNTER — Other Ambulatory Visit: Payer: Self-pay

## 2020-11-25 ENCOUNTER — Ambulatory Visit: Payer: BC Managed Care – PPO | Admitting: Pulmonary Disease

## 2020-11-25 ENCOUNTER — Encounter: Payer: Self-pay | Admitting: Pulmonary Disease

## 2020-11-25 VITALS — BP 114/62 | HR 63 | Temp 98.1°F | Ht 64.0 in | Wt 172.4 lb

## 2020-11-25 DIAGNOSIS — J479 Bronchiectasis, uncomplicated: Secondary | ICD-10-CM | POA: Diagnosis not present

## 2020-11-25 DIAGNOSIS — J4551 Severe persistent asthma with (acute) exacerbation: Secondary | ICD-10-CM | POA: Diagnosis not present

## 2020-11-25 NOTE — Patient Instructions (Signed)
I am glad you are starting to feel better Continue the Dupixent and inhalers as prescribed Follow-up in 6 months

## 2020-11-25 NOTE — Progress Notes (Signed)
Karen Barnes    [id number]    [birth year]  Primary Care Physician:Kremer, Talmadge Coventry, MD  Referring Physician: Mliss Sax, MD [address] [place],  [place] [address]  Chief complaint:   Follow-up for asthma, bronchiectasis  HPI: 60 year old with history of asthma, bronchiectasis immunotherapy with Dupixent started in 2019 Maintained on Symbicort, Spiriva. She has severe GERD and is followed by Dr. Ewing Schlein.  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  She is doing person classes at present.  Developed COVID-19 in December 2020.  She was treated with monoclonal antibody as an outpatient and did not require hospitalization.  Overall she recovered well with chest x-ray last month showing clear lungs  She is allergic to penicillins and doxycycline, macrolides have been ineffective in the past  She gets regular allergy shots with Dr. Madie Reno  Interim history: Has a prolonged bronchitis, asthma exacerbation requiring 3 rounds of Levaquin and 4 rounds of prednisone.  Chest x-ray and sputum cultures at that time per negative  She has finally turned the corner and is back to baseline now Continues on Dupixent, Symbicort and Spiriva   Outpatient Encounter Medications as of 11/25/2020  Medication Sig   albuterol (VENTOLIN HFA) 108 (90 Base) MCG/ACT inhaler Inhale 2 puffs into the lungs every 6 (six) hours as needed.   aspirin EC 81 MG tablet Take 81 mg by mouth daily.   budesonide-formoterol (SYMBICORT) 160-4.5 MCG/ACT inhaler Inhale 2 puffs into the lungs 2 (two) times daily.   Calcium Carbonate-Vitamin D 600-200 MG-UNIT TABS Take 1 tablet by mouth daily.   DUPIXENT 300 MG/2ML prefilled syringe INJECT 1 SYRINGE UNDER THE SKIN EVERY OTHER WEEK   Estradiol 10 MCG TABS vaginal tablet Place vaginally.   fluticasone (FLONASE) 50 MCG/ACT nasal spray Place 2 sprays into both nostrils 2 (two) times daily.    furosemide  (LASIX) 20 MG tablet Take 1 tablet (20MG ) by mouth daily as needed for fluid retention or swelling. Please make overdue appt for future refills Thank you 1st attempt   metFORMIN (GLUCOPHAGE XR) 500 MG 24 hr tablet Take one tablet nightly.   montelukast (SINGULAIR) 10 MG tablet Take 10 mg by mouth at bedtime.   Multiple Vitamins-Minerals (MULTIVITAMIN WITH MINERALS) tablet Take 1 tablet by mouth daily.   ondansetron (ZOFRAN-ODT) 4 MG disintegrating tablet Take 1 tablet (4 mg total) by mouth every 8 (eight) hours as needed for nausea or vomiting.   pantoprazole (PROTONIX) 40 MG tablet TAKE 1 TABLET(40 MG) BY MOUTH DAILY (Patient taking differently: 2 (two) times daily. TAKE 1 TABLET(40 MG) BY MOUTH DAILY)   Probiotic Product (ALIGN) 4 MG CAPS    rosuvastatin (CRESTOR) 5 MG tablet TAKE 1 TABLET(5 MG) BY MOUTH DAILY   sodium chloride HYPERTONIC 3 % nebulizer solution INHALE CONTENTS OF 1 VIAL VIA NEBULIZER TWICE DAILY AS DIRECTED.   SPIRIVA RESPIMAT 1.25 MCG/ACT AERS INHALE 2 PUFFS BY MOUTH EVERY DAY   spironolactone (ALDACTONE) 25 MG tablet Take 0.5 tablets (12.5 mg total) by mouth daily. Pt. Need to schedule an appt. With cardiologist in order to receive future refills. Thank you 1st attempt   Continuous Blood Gluc Sensor (FREESTYLE LIBRE 14 DAY SENSOR) MISC USE AS DIRECTED   EPINEPHrine 0.3 mg/0.3 mL IJ SOAJ injection Inject 0.3 mg into the muscle once for 1 dose.   No facility-administered encounter medications on file as of 11/25/2020.   Physical Exam: Blood  pressure 114/62, pulse 63, temperature 98.1 F (36.7 C), temperature source Oral, height 5\' 4"  (1.626 m), weight 172 lb 6.4 oz (78.2 kg), SpO2 100 %. Gen:      No acute distress HEENT:  EOMI, sclera anicteric Neck:     No masses; no thyromegaly Lungs:    Clear to auscultation bilaterally; normal respiratory effort CV:         Regular rate and rhythm; no murmurs Abd:      + bowel sounds; soft, non-tender; no palpable masses, no  distension Ext:    No edema; adequate peripheral perfusion Skin:      Warm and dry; no rash Neuro: alert and oriented x 3 Psych: normal mood and affect   Data Reviewed: Imaging: November 07, 2017 high-resolution CT scan of the chest images : Mild cylindrical bronchiectasis particularly in the left lower lobe, some in the right lower lobe.   10/18/2015 chest CT images  showing normal pulmonary parenchyma with the exception of a 3.4 mm nodule in the left lower lobe.  Chest x-ray 08/30/2020-no active cardiopulmonary disease I have reviewed the images personally.    PFTs: October 2019 ratio 90%, FVC 2.56 L 76% predicted, total lung capacity 4.32 L 84% predicted, DLCO 20.7 mL 84% predicted   Labs: October 2019 complement levels within normal limits, IgG subclasses all normal, alpha-1 antitrypsin level normal, CBC showed 300 absolute eosinophil count   11/03/2015 - Ige - 32  Sputum culture 08/31/2020-normal oropharyngeal flora   Heart catheterization: October 2019 right heart catheterization showed mean PA pressure 24, pulmonary capillary wedge pressure 19, PVR 0.7 Woods units  Assessment:  Bronchiectasis Continue chest vest, Mucinex.  Hypertonic saline for mucociliary clearance Sputum cultures have been negative for AFB   Severe persistent asthma Continue Symbicort, Spiriva, Dupixent She can use over-the-counter Zyrtec for postnasal drip Follows with Dr. November 2019 for allergy shots  Plan/Recommendations: Continue current asthma regimen Mucociliary clearance with percussion, Mucinex and hypertonic saline  Madie Reno MD [facility] 11/25/2020, 9:02 AM  CC: 11/27/2020,*

## 2020-11-26 ENCOUNTER — Other Ambulatory Visit: Payer: BC Managed Care – PPO

## 2020-11-29 ENCOUNTER — Ambulatory Visit
Admission: RE | Admit: 2020-11-29 | Discharge: 2020-11-29 | Disposition: A | Discharge status: Home / Self Care | Payer: BC Managed Care – PPO | Source: Ambulatory Visit | Attending: Internal Medicine | Admitting: Internal Medicine

## 2020-11-29 DIAGNOSIS — E042 Nontoxic multinodular goiter: Secondary | ICD-10-CM

## 2020-12-07 ENCOUNTER — Telehealth: Payer: BC Managed Care – PPO | Admitting: Family Medicine

## 2020-12-07 ENCOUNTER — Encounter: Payer: Self-pay | Admitting: Family Medicine

## 2020-12-07 VITALS — BP 113/72 | Temp 97.8°F | Wt 165.0 lb

## 2020-12-07 DIAGNOSIS — R0981 Nasal congestion: Secondary | ICD-10-CM

## 2020-12-07 DIAGNOSIS — R519 Headache, unspecified: Secondary | ICD-10-CM | POA: Diagnosis not present

## 2020-12-07 MED ORDER — CEFDINIR 300 MG PO CAPS: 300.0000 mg | ORAL_CAPSULE | Freq: Two times a day (BID) | ORAL | 0 refills | Status: DC

## 2020-12-07 NOTE — Patient Instructions (Signed)
-  I sent the medication(s) we discussed to your pharmacy: Meds ordered this encounter  Medications   cefdinir (OMNICEF) 300 MG capsule    Sig: Take 1 capsule (300 mg total) by mouth 2 (two) times daily.    Dispense:  14 capsule    Refill:  0    Nasal saline twice daily  I hope you are feeling better soon!  Seek in person care promptly if your symptoms worsen, new concerns arise or you are not improving with treatment.  It was nice to meet you today. I help [facility] out with telemedicine visits on Tuesdays and Thursdays and am available for visits on those days. If you have any concerns or questions following this visit please schedule a follow up visit with your Primary Care doctor or seek care at a local urgent care clinic to avoid delays in care.

## 2020-12-07 NOTE — Progress Notes (Signed)
Virtual Visit via Video Note  I connected with Chairty  on 12/07/20 at 11:40 AM EDT by a video enabled telemedicine application and verified that I am speaking with the correct person using two identifiers.  Location patient: home, [place] Location provider:work or home office Persons participating in the virtual visit: patient, provider  I discussed the limitations of evaluation and management by telemedicine and the availability of in person appointments. The patient expressed understanding and agreed to proceed.   HPI:  Acute telemedicine visit for Sinus issues: -Onset: about 2 weeks ago -Symptoms include: sinus issues, headache initially -that improved, cough, congestion - now worsening with thick discolored nasal congestion that smells bad, has some frontal sinus discomfort -Denies:fevers, CP, SOB, NVD, inability to ear/drink/get out of bed, asthma -Has tried: musinex, tylenol -Pertinent past medical history:see below -feels she has a sinus infection -Pertinent medication allergies:  Allergies  Allergen Reactions   Butorphanol Tartrate Other (See Comments)    REACTION: hallucinations   Hydrocodone Other (See Comments)    HALLUCINATIONS   Roxicet [Oxycodone-Acetaminophen] Other (See Comments)    HALLUCINATIONS   Doxycycline     DOES NOT WORK FOR PATIENT   Penicillins Hives    Occurred at age 74/CHILDHOOD ALLERGY Has patient had a PCN reaction causing immediate rash, facial/tongue/throat swelling, SOB or lightheadedness with hypotension: Unknown Has patient had a PCN reaction causing severe rash involving mucus membranes or skin necrosis: Unknown Has patient had a PCN reaction that required hospitalization: Unknown Has patient had a PCN reaction occurring within the last 10 years: No If all of the above answers are "NO", then may proceed with Cephalosporin use.   -reports penicillin allergy was mild - a rash, reports she can take omnicef fine  Immunization History  Administered  Date(s) Administered   Influenza Inj Mdck Quad Pf 10/01/2018   Influenza Split 03/12/2012, 11/12/2013, 11/07/2014   Influenza Whole 12/08/2009   Influenza,inj,Quad PF,6+ Mos 12/06/2012, 11/14/2017   Influenza-Unspecified 11/06/2016, 10/01/2018, 11/23/2020   PFIZER(Purple Top)SARS-COV-2 Vaccination 05/12/2019, 06/03/2019, 12/04/2019, 11/23/2020   Pneumococcal Conjugate-13 09/24/2015   Pneumococcal Polysaccharide-23 09/18/2014   Td 03/07/2007   Tdap 03/30/2017   Zoster Recombinat (Shingrix) 10/26/2018, 01/03/2019    ROS: See pertinent positives and negatives per HPI.  Past Medical History:  Diagnosis Date   Asthma, moderate persistent    sees Dr Harold Hedge   Atrial tachycardia [facility])    Chronic diastolic CHF (congestive heart failure) ([place])    COVID-19 virus infection 01/2019   Diabetes mellitus    A1C 6.2---->  2009   Family history of adverse reaction to anesthesia    sister had cardiac arrest with rhinoplasty   Fibrocystic breast    GERD (gastroesophageal reflux disease)    HH (hiatus hernia) 09/2014   Dr. [name]   History of echocardiogram    Echo 5/19: EF 55-60, normal wall motion, PASP 41   History of hiatal hernia    History of nuclear stress test    Nuclear stress test 5/19:  EF 64, normal perfusion, Low risk   Hot flashes    Hyperlipidemia    Premature atrial contractions    Pulmonary hypertension (HCC)    PVC's (premature ventricular contractions)    Recurrent cystitis    took  postcoital antibiotic for years, symptoms better after her hysterectomy 1996, symptoms re-surface in 2011   RUQ abdominal pain 2016   EGD showed small HH, otherwise normal   Wears glasses     Past Surgical History:  Procedure Laterality  Date   ABDOMINAL HYSTERECTOMY  1996   no oophorectomy, d/tendometriosis and fibroids approximately 1996   BREAST EXCISIONAL BIOPSY Right 03/12/2014   [facility]   BREAST LUMPECTOMY WITH RADIOACTIVE SEED LOCALIZATION Right 03/12/2014   Procedure: BREAST  LUMPECTOMY WITH RADIOACTIVE SEED LOCALIZATION;  Surgeon: Erroll Luna, MD;  Location: [place];  Service: General;  Laterality: Right;   BREAST SURGERY     Lumpectomy-- BENIGN    CATARACT EXTRACTION Bilateral 2011   CESAREAN SECTION     COSMETIC SURGERY  01/2017   [place] Touch w/ GYN   DILATION AND CURETTAGE OF UTERUS     LIPOMA EXCISION  1978   RIGHT HEART CATH N/A 11/26/2017   Procedure: RIGHT HEART CATH;  Surgeon: Larey Dresser, MD;  Location: [place] [facility];  Service: Cardiovascular;  Laterality: N/A;   UPPER GASTROINTESTINAL ENDOSCOPY     UPPER GI ENDOSCOPY  09/11/2014   Small Hiatus Hernia, Dr. [name]     Current Outpatient Medications:    albuterol (VENTOLIN HFA) 108 (90 Base) MCG/ACT inhaler, Inhale 2 puffs into the lungs every 6 (six) hours as needed., Disp: 8 g, Rfl: 2   aspirin EC 81 MG tablet, Take 81 mg by mouth daily., Disp: , Rfl:    budesonide-formoterol (SYMBICORT) 160-4.5 MCG/ACT inhaler, Inhale 2 puffs into the lungs 2 (two) times daily., Disp: , Rfl:    Calcium Carbonate-Vitamin D 600-200 MG-UNIT TABS, Take 1 tablet by mouth daily., Disp: , Rfl:    cefdinir (OMNICEF) 300 MG capsule, Take 1 capsule (300 mg total) by mouth 2 (two) times daily., Disp: 14 capsule, Rfl: 0   DUPIXENT 300 MG/2ML prefilled syringe, INJECT 1 SYRINGE UNDER THE SKIN EVERY OTHER WEEK, Disp: 12 mL, Rfl: 10   Estradiol 10 MCG TABS vaginal tablet, Place vaginally., Disp: , Rfl:    fluticasone (FLONASE) 50 MCG/ACT nasal spray, Place 2 sprays into both nostrils 2 (two) times daily. , Disp: , Rfl:    furosemide (LASIX) 20 MG tablet, Take 1 tablet (20MG ) by mouth daily as needed for fluid retention or swelling. Please make overdue appt for future refills Thank you 1st attempt, Disp: 30 tablet, Rfl: 0   metFORMIN (GLUCOPHAGE XR) 500 MG 24 hr tablet, Take one tablet nightly., Disp: 90 tablet, Rfl: 1   montelukast (SINGULAIR) 10 MG tablet, Take 10 mg by mouth at bedtime., Disp: ,  Rfl:    Multiple Vitamins-Minerals (MULTIVITAMIN WITH MINERALS) tablet, Take 1 tablet by mouth daily., Disp: , Rfl:    ondansetron (ZOFRAN-ODT) 4 MG disintegrating tablet, Take 1 tablet (4 mg total) by mouth every 8 (eight) hours as needed for nausea or vomiting., Disp: 20 tablet, Rfl: 0   pantoprazole (PROTONIX) 40 MG tablet, TAKE 1 TABLET(40 MG) BY MOUTH DAILY (Patient taking differently: 2 (two) times daily. TAKE 1 TABLET(40 MG) BY MOUTH DAILY), Disp: 90 tablet, Rfl: 3   Probiotic Product (ALIGN) 4 MG CAPS, , Disp: , Rfl:    rosuvastatin (CRESTOR) 5 MG tablet, TAKE 1 TABLET(5 MG) BY MOUTH DAILY, Disp: 90 tablet, Rfl: 3   sodium chloride HYPERTONIC 3 % nebulizer solution, INHALE CONTENTS OF 1 VIAL VIA NEBULIZER TWICE DAILY AS DIRECTED., Disp: 240 mL, Rfl: 12   SPIRIVA RESPIMAT 1.25 MCG/ACT AERS, INHALE 2 PUFFS BY MOUTH EVERY DAY, Disp: 4 g, Rfl: 11   spironolactone (ALDACTONE) 25 MG tablet, Take 0.5 tablets (12.5 mg total) by mouth daily. Pt. Need to schedule an appt. With cardiologist in order to receive future  refills. Thank you 1st attempt, Disp: 15 tablet, Rfl: 0   EPINEPHrine 0.3 mg/0.3 mL IJ SOAJ injection, Inject 0.3 mg into the muscle once for 1 dose., Disp: 1 each, Rfl: 5  EXAM:  VITALS per patient if applicable:  GENERAL: alert, oriented, appears well and in no acute distress  HEENT: atraumatic, conjunttiva clear, no obvious abnormalities on inspection of external nose and ears  NECK: normal movements of the head and neck  LUNGS: on inspection no signs of respiratory distress, breathing rate appears normal, no obvious gross SOB, gasping or wheezing  CV: no obvious cyanosis  MS: moves all visible extremities without noticeable abnormality  PSYCH/NEURO: pleasant and cooperative, no obvious depression or anxiety, speech and thought processing grossly intact  ASSESSMENT AND PLAN:  Discussed the following assessment and plan:  Sinus congestion  Facial discomfort  -we  discussed possible serious and likely etiologies, options for evaluation and workup, limitations of telemedicine visit vs in person visit, treatment, treatment risks and precautions. Pt is agreeable to treatment via telemedicine at this moment. Given duration of symptoms and worsening query sinusitis vs other. She prefers to try empiric tx with omnicef due to her medication allergies/intolerances. She reports has taken this without issue in the past. Reports prior penicillin allergy was mild - rash and was not anaphylaxis. Also advised nasal saline.  Advised to seek prompt in person care if worsening, new symptoms arise, medication reaction or if is not improving with treatment. Discussed options for inperson care if PCP office not available. Did let this patient know that I only do telemedicine on Tuesdays and Thursdays for [place]. Advised to schedule follow up visit with PCP or [facility] if any further questions or concerns to avoid delays in care.   I discussed the assessment and treatment plan with the patient. The patient was provided an opportunity to ask questions and all were answered. The patient agreed with the plan and demonstrated an understanding of the instructions.     Terressa Koyanagi, DO

## 2020-12-08 ENCOUNTER — Telehealth: Payer: Self-pay

## 2020-12-08 NOTE — Telephone Encounter (Signed)
Received faxed PA renewal form from [facility] regarding DUPIXENT. Form completed and faxed back. Will await determination.  Phone# [phone] Fax# [phone]

## 2020-12-09 NOTE — Telephone Encounter (Signed)
Received notification from [facility] regarding a prior authorization for DUPIXENT. Authorization has been APPROVED from 12/08/20 to 12/08/21.    Authorization # [id number]

## 2020-12-22 ENCOUNTER — Other Ambulatory Visit: Payer: Self-pay

## 2020-12-22 DIAGNOSIS — I5032 Chronic diastolic (congestive) heart failure: Secondary | ICD-10-CM

## 2020-12-22 DIAGNOSIS — E785 Hyperlipidemia, unspecified: Secondary | ICD-10-CM

## 2020-12-22 DIAGNOSIS — I10 Essential (primary) hypertension: Secondary | ICD-10-CM

## 2020-12-22 DIAGNOSIS — R002 Palpitations: Secondary | ICD-10-CM

## 2020-12-22 DIAGNOSIS — I272 Pulmonary hypertension, unspecified: Secondary | ICD-10-CM

## 2020-12-22 DIAGNOSIS — U099 Post covid-19 condition, unspecified: Secondary | ICD-10-CM

## 2020-12-22 MED ORDER — SPIRONOLACTONE 25 MG PO TABS: 12.5000 mg | ORAL_TABLET | Freq: Every day | ORAL | 0 refills | Status: DC

## 2020-12-22 MED ORDER — FUROSEMIDE 20 MG PO TABS: ORAL_TABLET | ORAL | 0 refills | Status: DC

## 2020-12-22 NOTE — Addendum Note (Signed)
Addended by: Margaret Pyle D on: 12/22/2020 02:29 PM   Modules accepted: Orders

## 2021-01-05 ENCOUNTER — Telehealth: Payer: Self-pay | Admitting: Pulmonary Disease

## 2021-01-05 NOTE — Telephone Encounter (Signed)
I called the patient and she was told that [facility] is no longer able to get the Hypertonic 3% Saline from their supplier anymore. Its on backorder from what I can gather.   She wants to know if you recommend any other type of nebulizer solution since she has been using this more with her vest therapy. Please advise.

## 2021-01-06 ENCOUNTER — Other Ambulatory Visit (HOSPITAL_COMMUNITY): Payer: Self-pay

## 2021-01-06 MED ORDER — SODIUM CHLORIDE 3 % IN NEBU
INHALATION_SOLUTION | RESPIRATORY_TRACT | 12 refills | Status: DC | PRN
  Filled 2021-01-06: qty 750, 25d supply, fill #0
  Filled 2021-03-15: qty 750, 25d supply, fill #1
  Filled 2021-06-23 – 2021-07-07 (×2): qty 750, 25d supply, fill #2
  Filled 2021-09-06: qty 750, 25d supply, fill #3
  Filled 2021-11-22: qty 750, 25d supply, fill #4

## 2021-01-06 NOTE — Telephone Encounter (Signed)
Spoke with [facility] outpatient pharmacy and they do have 1 case left of it. Will place order with them. ATC LVMTCB

## 2021-01-07 ENCOUNTER — Other Ambulatory Visit (HOSPITAL_COMMUNITY): Payer: Self-pay

## 2021-01-07 NOTE — Telephone Encounter (Signed)
Spoke with the pt and notified rx has been sent to [facility] pharm. She verbalized understanding. Nothing further needed.

## 2021-01-10 ENCOUNTER — Other Ambulatory Visit (HOSPITAL_COMMUNITY): Payer: Self-pay

## 2021-01-10 ENCOUNTER — Other Ambulatory Visit: Payer: Self-pay

## 2021-01-10 DIAGNOSIS — E118 Type 2 diabetes mellitus with unspecified complications: Secondary | ICD-10-CM

## 2021-01-10 MED ORDER — METFORMIN HCL ER 500 MG PO TB24: ORAL_TABLET | ORAL | 1 refills | Status: DC

## 2021-01-17 ENCOUNTER — Other Ambulatory Visit: Payer: Self-pay

## 2021-01-17 DIAGNOSIS — I272 Pulmonary hypertension, unspecified: Secondary | ICD-10-CM

## 2021-01-17 DIAGNOSIS — U099 Post covid-19 condition, unspecified: Secondary | ICD-10-CM

## 2021-02-01 ENCOUNTER — Other Ambulatory Visit: Payer: Self-pay

## 2021-02-01 DIAGNOSIS — E785 Hyperlipidemia, unspecified: Secondary | ICD-10-CM

## 2021-02-01 DIAGNOSIS — I5032 Chronic diastolic (congestive) heart failure: Secondary | ICD-10-CM

## 2021-02-01 DIAGNOSIS — I10 Essential (primary) hypertension: Secondary | ICD-10-CM

## 2021-02-01 DIAGNOSIS — R002 Palpitations: Secondary | ICD-10-CM

## 2021-02-01 MED ORDER — FUROSEMIDE 20 MG PO TABS: ORAL_TABLET | ORAL | 0 refills | Status: DC

## 2021-02-22 ENCOUNTER — Other Ambulatory Visit: Payer: Self-pay | Admitting: Pulmonary Disease

## 2021-02-22 ENCOUNTER — Other Ambulatory Visit: Payer: Self-pay

## 2021-02-22 DIAGNOSIS — J4550 Severe persistent asthma, uncomplicated: Secondary | ICD-10-CM

## 2021-02-22 DIAGNOSIS — J479 Bronchiectasis, uncomplicated: Secondary | ICD-10-CM

## 2021-02-22 MED ORDER — ROSUVASTATIN CALCIUM 5 MG PO TABS: 5.0000 mg | ORAL_TABLET | Freq: Every day | ORAL | 0 refills | Status: DC

## 2021-03-04 ENCOUNTER — Telehealth: Payer: BC Managed Care – PPO | Admitting: Physician Assistant

## 2021-03-04 DIAGNOSIS — J111 Influenza due to unidentified influenza virus with other respiratory manifestations: Secondary | ICD-10-CM

## 2021-03-04 MED ORDER — OSELTAMIVIR PHOSPHATE 75 MG PO CAPS: 75.0000 mg | ORAL_CAPSULE | Freq: Two times a day (BID) | ORAL | 0 refills | Status: DC

## 2021-03-04 MED ORDER — BENZONATATE 100 MG PO CAPS: 100.0000 mg | ORAL_CAPSULE | Freq: Three times a day (TID) | ORAL | 0 refills | Status: DC | PRN

## 2021-03-04 NOTE — Patient Instructions (Signed)
Karen Barnes, thank you for joining Mar Daring, PA-C for today's virtual visit.  While this provider is not your primary care provider (PCP), if your PCP is located in our provider database this encounter information will be shared with them immediately following your visit.  Consent: (Patient) Karen Barnes provided verbal consent for this virtual visit at the beginning of the encounter.  Current Medications:  Current Outpatient Medications:    benzonatate (TESSALON) 100 MG capsule, Take 1 capsule (100 mg total) by mouth 3 (three) times daily as needed., Disp: 30 capsule, Rfl: 0   oseltamivir (TAMIFLU) 75 MG capsule, Take 1 capsule (75 mg total) by mouth 2 (two) times daily., Disp: 10 capsule, Rfl: 0   albuterol (VENTOLIN HFA) 108 (90 Base) MCG/ACT inhaler, INHALE 2 PUFFS INTO THE LUNGS EVERY 6 HOURS AS NEEDED, Disp: 6.7 g, Rfl: 5   aspirin EC 81 MG tablet, Take 81 mg by mouth daily., Disp: , Rfl:    Calcium Carbonate-Vitamin D 600-200 MG-UNIT TABS, Take 1 tablet by mouth daily., Disp: , Rfl:    cefdinir (OMNICEF) 300 MG capsule, Take 1 capsule (300 mg total) by mouth 2 (two) times daily., Disp: 14 capsule, Rfl: 0   DUPIXENT 300 MG/2ML prefilled syringe, INJECT 1 SYRINGE UNDER THE SKIN EVERY OTHER WEEK, Disp: 12 mL, Rfl: 10   EPINEPHrine 0.3 mg/0.3 mL IJ SOAJ injection, Inject 0.3 mg into the muscle once for 1 dose., Disp: 1 each, Rfl: 5   Estradiol 10 MCG TABS vaginal tablet, Place vaginally., Disp: , Rfl:    fluticasone (FLONASE) 50 MCG/ACT nasal spray, SHAKE LIQUID AND USE 2 SPRAYS IN EACH NOSTRIL TWICE DAILY, Disp: 16 g, Rfl: 5   furosemide (LASIX) 20 MG tablet, Take 1 tablet (20MG ) by mouth daily as needed for fluid retention or swelling. Please make overdue appt with Dr. Johney Frame new Cardiologist before anymore refills. Thank you 3rd and Final attempt, Disp: 15 tablet, Rfl: 0   metFORMIN (GLUCOPHAGE XR) 500 MG 24 hr tablet, Take one tablet nightly., Disp: 90 tablet, Rfl:  1   montelukast (SINGULAIR) 10 MG tablet, TAKE 1 TABLET BY MOUTH EVERY DAY, Disp: 90 tablet, Rfl: 1   Multiple Vitamins-Minerals (MULTIVITAMIN WITH MINERALS) tablet, Take 1 tablet by mouth daily., Disp: , Rfl:    ondansetron (ZOFRAN-ODT) 4 MG disintegrating tablet, Take 1 tablet (4 mg total) by mouth every 8 (eight) hours as needed for nausea or vomiting., Disp: 20 tablet, Rfl: 0   pantoprazole (PROTONIX) 40 MG tablet, TAKE 1 TABLET(40 MG) BY MOUTH DAILY (Patient taking differently: 2 (two) times daily. TAKE 1 TABLET(40 MG) BY MOUTH DAILY), Disp: 90 tablet, Rfl: 3   Probiotic Product (ALIGN) 4 MG CAPS, , Disp: , Rfl:    rosuvastatin (CRESTOR) 5 MG tablet, Take 1 tablet (5 mg total) by mouth daily. Please make overdue appt with Dr. Johney Frame new Cardiologist before anymore refills. Thank you 1st attempt, Disp: 30 tablet, Rfl: 0   sodium chloride HYPERTONIC 3 % nebulizer solution, INHALE CONTENTS OF 1 VIAL VIA NEBULIZER TWICE DAILY AS DIRECTED., Disp: 240 mL, Rfl: 12   sodium chloride HYPERTONIC 3 % nebulizer solution, Take by nebulization twice daily as needed, Disp: 750 mL, Rfl: 12   spironolactone (ALDACTONE) 25 MG tablet, Take 0.5 tablets (12.5 mg total) by mouth daily. Please make overdue appt with Dr. Johney Frame new Cardiologist before anymore refills. Thank you 2nd attempt, Disp: 15 tablet, Rfl: 0   SYMBICORT 160-4.5 MCG/ACT inhaler, INHALE 2 PUFFS BY  MOUTH TWICE DAILY, Disp: 10.2 g, Rfl: 5   Tiotropium Bromide Monohydrate (SPIRIVA RESPIMAT) 1.25 MCG/ACT AERS, INHALE 2 PUFFS BY MOUTH EVERY DAY, Disp: 4 g, Rfl: 5   Medications ordered in this encounter:  Meds ordered this encounter  Medications   oseltamivir (TAMIFLU) 75 MG capsule    Sig: Take 1 capsule (75 mg total) by mouth 2 (two) times daily.    Dispense:  10 capsule    Refill:  0    Order Specific Question:   Supervising Provider    Answer:   MILLER, BRIAN [[id number]]   benzonatate (TESSALON) 100 MG capsule    Sig: Take 1 capsule (100 mg  total) by mouth 3 (three) times daily as needed.    Dispense:  30 capsule    Refill:  0    Order Specific Question:   Supervising Provider    Answer:   Sabra Heck, [place]     *If you need refills on other medications prior to your next appointment, please contact your pharmacy*  Follow-Up: Call back or seek an in-person evaluation if the symptoms worsen or if the condition fails to improve as anticipated.  Other Instructions Influenza, Adult Influenza, also called "the flu," is a viral infection that mainly affects the respiratory tract. This includes the lungs, nose, and throat. The flu spreads easily from person to person (is contagious). It causes common cold symptoms, along with high fever and body aches. What are the causes? This condition is caused by the influenza virus. You can get the virus by: Breathing in droplets that are in the air from an infected person's cough or sneeze. Touching something that has the virus on it (has been contaminated) and then touching your mouth, nose, or eyes. What increases the risk? The following factors may make you more likely to get the flu: Not washing or sanitizing your hands often. Having close contact with many people during cold and flu season. Touching your mouth, eyes, or nose without first washing or sanitizing your hands. Not getting an annual flu shot. You may have a higher risk for the flu, including serious problems, such as a lung infection (pneumonia), if you: Are older than 65. Are pregnant. Have a weakened disease-fighting system (immune system). This includes people who have HIV or AIDS, are on chemotherapy, or are taking medicines that reduce (suppress) the immune system. Have a long-term (chronic) illness, such as heart disease, kidney disease, diabetes, or lung disease. Have a liver disorder. Are severely overweight (morbidly obese). Have anemia. Have asthma. What are the signs or symptoms? Symptoms of this condition  usually begin suddenly and last 4-14 days. These may include: Fever and chills. Headaches, body aches, or muscle aches. Sore throat. Cough. Runny or stuffy (congested) nose. Chest discomfort. Poor appetite. Weakness or fatigue. Dizziness. Nausea or vomiting. How is this diagnosed? This condition may be diagnosed based on: Your symptoms and medical history. A physical exam. Swabbing your nose or throat and testing the fluid for the influenza virus. How is this treated? If the flu is diagnosed early, you can be treated with antiviral medicine that is given by mouth (orally) or through an IV. This can help reduce how severe the illness is and how long it lasts. Taking care of yourself at home can help relieve symptoms. Your health care provider may recommend: Taking over-the-counter medicines. Drinking plenty of fluids. In many cases, the flu goes away on its own. If you have severe symptoms or complications, you may be  treated in a hospital. Follow these instructions at home: Activity Rest as needed and get plenty of sleep. Stay home from work or school as told by your health care provider. Unless you are visiting your health care provider, avoid leaving home until your fever has been gone for 24 hours without taking medicine. Eating and drinking Take an oral rehydration solution (ORS). This is a drink that is sold at pharmacies and retail stores. Drink enough fluid to keep your urine pale yellow. Drink clear fluids in small amounts as you are able. Clear fluids include water, ice chips, fruit juice mixed with water, and low-calorie sports drinks. Eat bland, easy-to-digest foods in small amounts as you are able. These foods include bananas, applesauce, rice, lean meats, toast, and crackers. Avoid drinking fluids that contain a lot of sugar or caffeine, such as energy drinks, regular sports drinks, and soda. Avoid alcohol. Avoid spicy or fatty foods. General instructions   Take  over-the-counter and prescription medicines only as told by your health care provider. Use a cool mist humidifier to add humidity to the air in your home. This can make it easier to breathe. When using a cool mist humidifier, clean it daily. Empty the water and replace it with clean water. Cover your mouth and nose when you cough or sneeze. Wash your hands with soap and water often and for at least 20 seconds, especially after you cough or sneeze. If soap and water are not available, use alcohol-based hand sanitizer. Keep all follow-up visits. This is important. How is this prevented?  Get an annual flu shot. This is usually available in late summer, fall, or winter. Ask your health care provider when you should get your flu shot. Avoid contact with people who are sick during cold and flu season. This is generally fall and winter. Contact a health care provider if: You develop new symptoms. You have: Chest pain. Diarrhea. A fever. Your cough gets worse. You produce more mucus. You feel nauseous or you vomit. Get help right away if you: Develop shortness of breath or have difficulty breathing. Have skin or nails that turn a bluish color. Have severe pain or stiffness in your neck. Develop a sudden headache or sudden pain in your face or ear. Cannot eat or drink without vomiting. These symptoms may represent a serious problem that is an emergency. Do not wait to see if the symptoms will go away. Get medical help right away. Call your local emergency services (911 in the [place] Do not drive yourself to the hospital. Summary Influenza, also called "the flu," is a viral infection that primarily affects your respiratory tract. Symptoms of the flu usually begin suddenly and last 4-14 days. Getting an annual flu shot is the best way to prevent getting the flu. Stay home from work or school as told by your health care provider. Unless you are visiting your health care provider, avoid leaving  home until your fever has been gone for 24 hours without taking medicine. Keep all follow-up visits. This is important. This information is not intended to replace advice given to you by your health care provider. Make sure you discuss any questions you have with your health care provider. Document Revised: 09/12/2019 Document Reviewed: 09/12/2019 Elsevier Patient Education  2022 [employer].    If you have been instructed to have an in-person evaluation today at a local Urgent Care facility, please use the link below. It will take you to a list of all of our available  [facility] [place] [facility], including address, phone number and hours of operation. Please do not delay care.  [facility]  If you or a family member do not have a primary care provider, use the link below to schedule a visit and establish care. When you choose a [facility] primary care physician or advanced practice provider, you gain a long-term partner in health. Find a Primary Care Provider  Learn more about [facility] in-office and virtual care options: [facility]

## 2021-03-04 NOTE — Progress Notes (Signed)
Virtual Visit Consent   [name], you are scheduled for a virtual visit with a [facility] provider today.     Just as with appointments in the office, your consent must be obtained to participate.  Your consent will be active for this visit and any virtual visit you may have with one of our providers in the next 365 days.     If you have a MyChart account, a copy of this consent can be sent to you electronically.  All virtual visits are billed to your insurance company just like a traditional visit in the office.    As this is a virtual visit, video technology does not allow for your provider to perform a traditional examination.  This may limit your provider's ability to fully assess your condition.  If your provider identifies any concerns that need to be evaluated in person or the need to arrange testing (such as labs, EKG, etc.), we will make arrangements to do so.     Although advances in technology are sophisticated, we cannot ensure that it will always work on either your end or our end.  If the connection with a video visit is poor, the visit may have to be switched to a telephone visit.  With either a video or telephone visit, we are not always able to ensure that we have a secure connection.     I need to obtain your verbal consent now.   Are you willing to proceed with your visit today?    Karen Barnes has provided verbal consent on 03/04/2021 for a virtual visit (video or telephone).   Margaretann Loveless, PA-C   Date: 03/04/2021 9:04 AM   Virtual Visit via Video Note   I, Margaretann Loveless, connected with  Karen Barnes  ([id number], 01-19-1961) on 03/04/21 at  9:00 AM EST by a video-enabled telemedicine application and verified that I am speaking with the correct person using two identifiers.  Location: Patient: Virtual Visit Location Patient: Home Provider: Virtual Visit Location Provider: Home Office   I discussed the limitations of evaluation and  management by telemedicine and the availability of in person appointments. The patient expressed understanding and agreed to proceed.    History of Present Illness: Karen RUPARD is a 61 y.o. who identifies as a female who was assigned female at birth, and is being seen today for flu-like symptoms.  HPI: Influenza This is a new problem. The current episode started yesterday. The problem occurs constantly. The problem has been gradually worsening. Associated symptoms include arthralgias, chills, congestion, coughing (mild productive), fatigue, a fever (101.8 highest), headaches, myalgias and a sore throat (scratchy). Pertinent negatives include no weakness. Associated symptoms comments: Itchy ears. She has tried acetaminophen, lying down, rest, sleep and position changes (sodium chloride nebulizer, pulmonary vest) for the symptoms.   Covid testing at home x 2 have been negative  Problems:  Patient Active Problem List   Diagnosis Date Noted   Acute atopic conjunctivitis of both eyes 08/16/2020   Chemosis of conjunctiva of both eyes 08/16/2020   Shortness of breath 11/04/2019   Abnormal findings on diagnostic imaging of lung 11/04/2019   Multinodular goiter 09/12/2019   Pneumonia due to COVID-19 virus 03/03/2019   Contact with and (suspected) exposure to covid-19 02/11/2019   Subdural hematoma 12/12/2018   Conjunctivitis 12/12/2018   Severe persistent asthma 10/31/2018   Bilateral leg edema 09/01/2018   Bronchiectasis without complication (HCC) 06/27/2018   On corticosteroid therapy  05/29/2018   Bronchiectasis with acute exacerbation (HCC) 05/16/2018   Thyroid nodule 11/08/2017   Low TSH level 11/08/2017   Edema of both ankles 02/03/2016   Sleep choking syndrome 02/03/2016   OSA (obstructive sleep apnea) 02/03/2016   Lung nodule 11/03/2015   PCP NOTES >>>>>>>>>>>>>>>>>>>>>>>>>>>>>>>>>>> 03/26/2015   Family history of breast cancer 02/23/2012   At risk for coronary artery disease  02/07/2012   Obesity 12/13/2010   Heart palpitations 12/13/2010   Elevated blood pressure reading 12/13/2010   Hyperlipidemia 03/07/2007   PAC 03/07/2007   CYSTITIS, RECURRENT 03/07/2007   Type 2 diabetes mellitus with complication (HCC) 03/07/2007   Asthmatic bronchitis with acute exacerbation 07/12/2006   GERD 07/12/2006    Allergies:  Allergies  Allergen Reactions   Butorphanol Tartrate Other (See Comments)    REACTION: hallucinations   Hydrocodone Other (See Comments)    HALLUCINATIONS   Roxicet [Oxycodone-Acetaminophen] Other (See Comments)    HALLUCINATIONS   Doxycycline     DOES NOT WORK FOR PATIENT   Penicillins Hives    Occurred at age 29/CHILDHOOD ALLERGY Has patient had a PCN reaction causing immediate rash, facial/tongue/throat swelling, SOB or lightheadedness with hypotension: Unknown Has patient had a PCN reaction causing severe rash involving mucus membranes or skin necrosis: Unknown Has patient had a PCN reaction that required hospitalization: Unknown Has patient had a PCN reaction occurring within the last 10 years: No If all of the above answers are "NO", then may proceed with Cephalosporin use.    Medications:  Current Outpatient Medications:    benzonatate (TESSALON) 100 MG capsule, Take 1 capsule (100 mg total) by mouth 3 (three) times daily as needed., Disp: 30 capsule, Rfl: 0   oseltamivir (TAMIFLU) 75 MG capsule, Take 1 capsule (75 mg total) by mouth 2 (two) times daily., Disp: 10 capsule, Rfl: 0   albuterol (VENTOLIN HFA) 108 (90 Base) MCG/ACT inhaler, INHALE 2 PUFFS INTO THE LUNGS EVERY 6 HOURS AS NEEDED, Disp: 6.7 g, Rfl: 5   aspirin EC 81 MG tablet, Take 81 mg by mouth daily., Disp: , Rfl:    Calcium Carbonate-Vitamin D 600-200 MG-UNIT TABS, Take 1 tablet by mouth daily., Disp: , Rfl:    cefdinir (OMNICEF) 300 MG capsule, Take 1 capsule (300 mg total) by mouth 2 (two) times daily., Disp: 14 capsule, Rfl: 0   DUPIXENT 300 MG/2ML prefilled syringe,  INJECT 1 SYRINGE UNDER THE SKIN EVERY OTHER WEEK, Disp: 12 mL, Rfl: 10   EPINEPHrine 0.3 mg/0.3 mL IJ SOAJ injection, Inject 0.3 mg into the muscle once for 1 dose., Disp: 1 each, Rfl: 5   Estradiol 10 MCG TABS vaginal tablet, Place vaginally., Disp: , Rfl:    fluticasone (FLONASE) 50 MCG/ACT nasal spray, SHAKE LIQUID AND USE 2 SPRAYS IN EACH NOSTRIL TWICE DAILY, Disp: 16 g, Rfl: 5   furosemide (LASIX) 20 MG tablet, Take 1 tablet (20MG ) by mouth daily as needed for fluid retention or swelling. Please make overdue appt with Dr. Shari ProwsPemberton new Cardiologist before anymore refills. Thank you 3rd and Final attempt, Disp: 15 tablet, Rfl: 0   metFORMIN (GLUCOPHAGE XR) 500 MG 24 hr tablet, Take one tablet nightly., Disp: 90 tablet, Rfl: 1   montelukast (SINGULAIR) 10 MG tablet, TAKE 1 TABLET BY MOUTH EVERY DAY, Disp: 90 tablet, Rfl: 1   Multiple Vitamins-Minerals (MULTIVITAMIN WITH MINERALS) tablet, Take 1 tablet by mouth daily., Disp: , Rfl:    ondansetron (ZOFRAN-ODT) 4 MG disintegrating tablet, Take 1 tablet (4 mg total) by mouth  every 8 (eight) hours as needed for nausea or vomiting., Disp: 20 tablet, Rfl: 0   pantoprazole (PROTONIX) 40 MG tablet, TAKE 1 TABLET(40 MG) BY MOUTH DAILY (Patient taking differently: 2 (two) times daily. TAKE 1 TABLET(40 MG) BY MOUTH DAILY), Disp: 90 tablet, Rfl: 3   Probiotic Product (ALIGN) 4 MG CAPS, , Disp: , Rfl:    rosuvastatin (CRESTOR) 5 MG tablet, Take 1 tablet (5 mg total) by mouth daily. Please make overdue appt with Dr. Shari Prows new Cardiologist before anymore refills. Thank you 1st attempt, Disp: 30 tablet, Rfl: 0   sodium chloride HYPERTONIC 3 % nebulizer solution, INHALE CONTENTS OF 1 VIAL VIA NEBULIZER TWICE DAILY AS DIRECTED., Disp: 240 mL, Rfl: 12   sodium chloride HYPERTONIC 3 % nebulizer solution, Take by nebulization twice daily as needed, Disp: 750 mL, Rfl: 12   spironolactone (ALDACTONE) 25 MG tablet, Take 0.5 tablets (12.5 mg total) by mouth daily. Please  make overdue appt with Dr. Shari Prows new Cardiologist before anymore refills. Thank you 2nd attempt, Disp: 15 tablet, Rfl: 0   SYMBICORT 160-4.5 MCG/ACT inhaler, INHALE 2 PUFFS BY MOUTH TWICE DAILY, Disp: 10.2 g, Rfl: 5   Tiotropium Bromide Monohydrate (SPIRIVA RESPIMAT) 1.25 MCG/ACT AERS, INHALE 2 PUFFS BY MOUTH EVERY DAY, Disp: 4 g, Rfl: 5  Observations/Objective: Patient is well-developed, well-nourished in no acute distress.  Resting comfortably at home.  Head is normocephalic, atraumatic.  No labored breathing.  Speech is clear and coherent with logical content.  Patient is alert and oriented at baseline.    Assessment and Plan: 1. Influenza - oseltamivir (TAMIFLU) 75 MG capsule; Take 1 capsule (75 mg total) by mouth 2 (two) times daily.  Dispense: 10 capsule; Refill: 0 - benzonatate (TESSALON) 100 MG capsule; Take 1 capsule (100 mg total) by mouth 3 (three) times daily as needed.  Dispense: 30 capsule; Refill: 0  - Suspect influenza - Tamiflu and tessalon perles prescribed - May continue asthma and bronchiectasis medications - Continue Tylenol as needed - Continue Mucinex as needed - Rest - Push fluids - Seek in person evaluation if not improving or if symptoms worsen  Follow Up Instructions: I discussed the assessment and treatment plan with the patient. The patient was provided an opportunity to ask questions and all were answered. The patient agreed with the plan and demonstrated an understanding of the instructions.  A copy of instructions were sent to the patient via MyChart unless otherwise noted below.    The patient was advised to call back or seek an in-person evaluation if the symptoms worsen or if the condition fails to improve as anticipated.  Time:  I spent 10 minutes with the patient via telehealth technology discussing the above problems/concerns.    Margaretann Loveless, PA-C

## 2021-03-15 ENCOUNTER — Other Ambulatory Visit (HOSPITAL_COMMUNITY): Payer: Self-pay

## 2021-03-16 ENCOUNTER — Other Ambulatory Visit (HOSPITAL_COMMUNITY): Payer: Self-pay

## 2021-03-28 ENCOUNTER — Other Ambulatory Visit: Payer: Self-pay

## 2021-03-28 DIAGNOSIS — I5032 Chronic diastolic (congestive) heart failure: Secondary | ICD-10-CM

## 2021-03-28 DIAGNOSIS — I10 Essential (primary) hypertension: Secondary | ICD-10-CM

## 2021-03-28 DIAGNOSIS — E785 Hyperlipidemia, unspecified: Secondary | ICD-10-CM

## 2021-03-28 DIAGNOSIS — R002 Palpitations: Secondary | ICD-10-CM

## 2021-03-28 MED ORDER — FUROSEMIDE 20 MG PO TABS: ORAL_TABLET | ORAL | 1 refills | Status: DC

## 2021-03-31 ENCOUNTER — Other Ambulatory Visit: Payer: Self-pay

## 2021-04-01 ENCOUNTER — Ambulatory Visit: Payer: BC Managed Care – PPO | Admitting: Family Medicine

## 2021-04-01 ENCOUNTER — Encounter: Payer: Self-pay | Admitting: Family Medicine

## 2021-04-01 ENCOUNTER — Other Ambulatory Visit: Payer: Self-pay

## 2021-04-01 VITALS — BP 118/80 | HR 81 | Temp 97.9°F | Ht 64.0 in | Wt 178.4 lb

## 2021-04-01 DIAGNOSIS — E118 Type 2 diabetes mellitus with unspecified complications: Secondary | ICD-10-CM | POA: Diagnosis not present

## 2021-04-01 DIAGNOSIS — M7712 Lateral epicondylitis, left elbow: Secondary | ICD-10-CM | POA: Diagnosis not present

## 2021-04-01 DIAGNOSIS — E876 Hypokalemia: Secondary | ICD-10-CM | POA: Diagnosis not present

## 2021-04-01 DIAGNOSIS — E782 Mixed hyperlipidemia: Secondary | ICD-10-CM | POA: Diagnosis not present

## 2021-04-01 LAB — BASIC METABOLIC PANEL
BUN: 19 mg/dL (ref 6–23)
CO2: 35 mEq/L — ABNORMAL HIGH (ref 19–32)
Calcium: 9.7 mg/dL (ref 8.4–10.5)
Chloride: 96 mEq/L (ref 96–112)
Creatinine, Ser: 0.79 mg/dL (ref 0.40–1.20)
GFR: 80.13 mL/min (ref >60.00)
Glucose, Bld: 302 mg/dL — ABNORMAL HIGH (ref 70–99)
Potassium: 3.3 mEq/L — ABNORMAL LOW (ref 3.5–5.1)
Sodium: 135 mEq/L (ref 135–145)

## 2021-04-01 LAB — LIPID PANEL
Cholesterol: 163 mg/dL (ref 0–200)
HDL: 66.4 mg/dL (ref >39.00)
LDL Cholesterol: 77 mg/dL (ref 0–99)
NonHDL: 97.03
Total CHOL/HDL Ratio: 2
Triglycerides: 101 mg/dL (ref 0.0–149.0)
VLDL: 20.2 mg/dL (ref 0.0–40.0)

## 2021-04-01 LAB — MICROALBUMIN / CREATININE URINE RATIO
Creatinine,U: 80.5 mg/dL
Microalb Creat Ratio: 1 mg/g (ref 0.0–30.0)
Microalb, Ur: 0.8 mg/dL (ref 0.0–1.9)

## 2021-04-01 LAB — HEMOGLOBIN A1C: Hgb A1c MFr Bld: 10.7 % — ABNORMAL HIGH (ref 4.6–6.5)

## 2021-04-01 LAB — LDL CHOLESTEROL, DIRECT: Direct LDL: 82 mg/dL

## 2021-04-01 MED ORDER — POTASSIUM CHLORIDE CRYS ER 20 MEQ PO TBCR: 20.0000 meq | EXTENDED_RELEASE_TABLET | Freq: Every day | ORAL | 0 refills | Status: DC

## 2021-04-01 MED ORDER — METFORMIN HCL ER 500 MG PO TB24: 500.0000 mg | ORAL_TABLET | Freq: Two times a day (BID) | ORAL | 1 refills | Status: DC

## 2021-04-01 MED ORDER — ROSUVASTATIN CALCIUM 10 MG PO TABS: 10.0000 mg | ORAL_TABLET | Freq: Every day | ORAL | 3 refills | Status: AC

## 2021-04-01 MED ORDER — METFORMIN HCL ER 500 MG PO TB24: 500.0000 mg | ORAL_TABLET | Freq: Every day | ORAL | 1 refills | Status: DC

## 2021-04-01 NOTE — Progress Notes (Addendum)
Established Patient Office Visit  Subjective:  Patient ID: Karen Barnes, female    DOB: [birth year]  Age: 61 y.o. MRN: [id number]  CC:  Chief Complaint  Patient presents with   Follow-up    6 month f/u, fasting today.   C/o     HPI Karen Barnes presents for follow-up of type 2 diabetes, mixed hyperlipidemia left elbow pain.  Injured her left elbow opening boxes 2 months ago.  She has treated it with 10 pends in an elbow sling.  Right-hand-dominant.  Lost her father back in December.  He had suffered from diabetes and dementia.  Weight is increased.  Continues with nightly metformin 500 mg XL and rosuvastatin.  Past Medical History:  Diagnosis Date   Asthma, moderate persistent    sees Dr Harold Hedge   Atrial tachycardia [facility])    Chronic diastolic CHF (congestive heart failure) ([place])    COVID-19 virus infection 01/2019   Diabetes mellitus    A1C 6.2---->  2009   Family history of adverse reaction to anesthesia    sister had cardiac arrest with rhinoplasty   Fibrocystic breast    GERD (gastroesophageal reflux disease)    HH (hiatus hernia) 09/2014   Dr. [name]   History of echocardiogram    Echo 5/19: EF 55-60, normal wall motion, PASP 41   History of hiatal hernia    History of nuclear stress test    Nuclear stress test 5/19:  EF 64, normal perfusion, Low risk   Hot flashes    Hyperlipidemia    Premature atrial contractions    Pulmonary hypertension ([place])    PVC's (premature ventricular contractions)    Recurrent cystitis    took  postcoital antibiotic for years, symptoms better after her hysterectomy 1996, symptoms re-surface in 2011   RUQ abdominal pain 2016   EGD showed small HH, otherwise normal   Wears glasses     Past Surgical History:  Procedure Laterality Date   ABDOMINAL HYSTERECTOMY  1996   no oophorectomy, d/tendometriosis and fibroids approximately 1996   BREAST EXCISIONAL BIOPSY Right 03/12/2014   [facility]   BREAST LUMPECTOMY WITH RADIOACTIVE SEED  LOCALIZATION Right 03/12/2014   Procedure: BREAST LUMPECTOMY WITH RADIOACTIVE SEED LOCALIZATION;  Surgeon: Erroll Luna, MD;  Location: [facility];  Service: General;  Laterality: Right;   BREAST SURGERY     Lumpectomy-- BENIGN    CATARACT EXTRACTION Bilateral 2011   CESAREAN SECTION     COSMETIC SURGERY  01/2017   [facility] Touch w/ GYN   DILATION AND CURETTAGE OF UTERUS     [place]   RIGHT HEART CATH N/A 11/26/2017   Procedure: RIGHT HEART CATH;  Surgeon: Larey Dresser, MD;  Location: [facility];  Service: Cardiovascular;  Laterality: N/A;   UPPER GASTROINTESTINAL ENDOSCOPY     UPPER GI ENDOSCOPY  09/11/2014   Small Hiatus Hernia, Dr. [name]    Family History  Problem Relation Age of Onset   Mitral valve prolapse Mother    Breast cancer Mother 81       M had ca insitu (age 16), aunt    Dementia Mother    Heart disease Father 61       early    Diabetes Father    Kidney failure Father    Sleep apnea Father    Brain cancer Other        2 fam members    Lung cancer Other  uncle, heavy smoker   Breast cancer Maternal Aunt 60   CAD Paternal Grandmother 43   CAD Paternal Grandfather 43   Colon cancer Neg Hx     Social History   Socioeconomic History   Marital status: Married    Spouse name: Not on file   Number of children: 1   Years of education: College   Highest education level: Not on file  Occupational History   Occupation: retired principal  Tobacco Use   Smoking status: Never   Smokeless tobacco: Never  Vaping Use   Vaping Use: Never used  Substance and Sexual Activity   Alcohol use: No   Drug use: No   Sexual activity: Yes  Other Topics Concern   Not on file  Social History Narrative   Household-- pt and husband    Rare caffeine use   Daughter independent, 1 G-child   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     Calcium Carbonate-Vitamin D 600-200 MG-UNIT TABS Take 1 tablet by mouth daily.     chlorhexidine (PERIDEX) 0.12 % solution SMARTSIG:By Mouth     DUPIXENT 300 MG/2ML prefilled syringe INJECT 1 SYRINGE UNDER THE SKIN EVERY OTHER WEEK 12 mL 10   fluticasone (FLONASE) 50 MCG/ACT nasal spray SHAKE LIQUID AND USE 2 SPRAYS IN EACH NOSTRIL TWICE DAILY 16 g 5   furosemide (LASIX) 20 MG tablet Take 1 tablet (20MG ) by mouth daily as needed for fluid retention or swelling. Please keep upcoming appt with Dr. Johney Frame in April 2023 before anymore refills. Thank you Final attempt 30 tablet 1   montelukast (SINGULAIR) 10 MG tablet TAKE 1 TABLET BY MOUTH EVERY DAY 90 tablet 1   Multiple Vitamins-Minerals (MULTIVITAMIN WITH MINERALS) tablet Take 1 tablet by mouth daily.     ondansetron (ZOFRAN-ODT) 4 MG disintegrating tablet Take 1 tablet (4 mg total) by mouth every 8 (eight) hours as needed for nausea or vomiting. 20 tablet 0   pantoprazole (PROTONIX) 40 MG tablet TAKE 1 TABLET(40 MG) BY MOUTH DAILY (Patient taking differently: daily. TAKE 1 TABLET(40 MG) BY MOUTH DAILY) 90 tablet 3   Probiotic Product (ALIGN) 4 MG CAPS      sodium chloride HYPERTONIC 3 % nebulizer solution INHALE CONTENTS OF 1 VIAL VIA NEBULIZER TWICE DAILY AS DIRECTED. 240 mL 12   sodium chloride HYPERTONIC 3 % nebulizer solution Take by nebulization twice daily as needed 750 mL 12   spironolactone (ALDACTONE) 25 MG tablet Take 0.5 tablets (12.5 mg total) by mouth daily. Please make overdue appt with Dr. Johney Frame new Cardiologist  before anymore refills. Thank you 2nd attempt 15 tablet 0   SYMBICORT 160-4.5 MCG/ACT inhaler INHALE 2 PUFFS BY MOUTH TWICE DAILY 10.2 g 5   Tiotropium Bromide Monohydrate (SPIRIVA RESPIMAT) 1.25 MCG/ACT AERS INHALE 2 PUFFS BY MOUTH EVERY DAY 4 g 5   cefdinir (OMNICEF) 300 MG capsule Take 1 capsule (300  mg total) by mouth 2 (two) times daily. 14 capsule 0   Estradiol 10 MCG TABS vaginal tablet Place vaginally.     metFORMIN (GLUCOPHAGE XR) 500 MG 24 hr tablet Take one tablet nightly. 90 tablet 1   rosuvastatin (CRESTOR) 5 MG tablet Take 1 tablet (5 mg total) by mouth daily. Please make overdue appt with Dr. Johney Frame new Cardiologist before anymore refills. Thank you 1st attempt 30 tablet 0   benzonatate (TESSALON) 100 MG capsule Take 1 capsule (100 mg total) by mouth 3 (three) times daily as needed. 30 capsule 0   EPINEPHrine 0.3 mg/0.3 mL IJ SOAJ injection Inject 0.3 mg into the muscle once for 1 dose. (Patient not taking: Reported on 04/01/2021) 1 each 5   oseltamivir (TAMIFLU) 75 MG capsule Take 1 capsule (75 mg total) by mouth 2 (two) times daily. 10 capsule 0   No facility-administered medications prior to visit.    Allergies  Allergen Reactions   Butorphanol Tartrate Other (See Comments)    REACTION: hallucinations   Hydrocodone Other (See Comments)    HALLUCINATIONS   Roxicet [Oxycodone-Acetaminophen] Other (See Comments)    HALLUCINATIONS   Doxycycline     DOES NOT WORK FOR PATIENT   Penicillins Hives    Occurred at age 31/CHILDHOOD ALLERGY Has patient had a PCN reaction causing immediate rash, facial/tongue/throat swelling, SOB or lightheadedness with hypotension: Unknown Has patient had a PCN reaction causing severe rash involving mucus membranes or skin necrosis: Unknown Has patient had a PCN reaction that required hospitalization: Unknown Has patient had a PCN reaction occurring within the last 10 years: No If all of the above answers are "NO", then may proceed with  Cephalosporin use.     ROS Review of Systems  Constitutional: Negative.   HENT: Negative.    Respiratory: Negative.    Cardiovascular: Negative.   Gastrointestinal: Negative.   Genitourinary: Negative.   Musculoskeletal:  Positive for arthralgias.  Neurological:  Negative for speech difficulty, weakness and numbness.  Psychiatric/Behavioral:         Grieving      Objective:    Physical Exam Vitals reviewed.  Constitutional:      General: She is not in acute distress.    Appearance: Normal appearance. She is not ill-appearing, toxic-appearing or diaphoretic.  HENT:     Head: Normocephalic and atraumatic.     Right Ear: External ear normal.     Left Ear: External ear normal.     Mouth/Throat:     Mouth: Mucous membranes are moist.     Pharynx: Oropharynx is clear. No oropharyngeal exudate or posterior oropharyngeal erythema.  Eyes:     General: No scleral icterus.       Right eye: No discharge.        Left eye: No discharge.     Extraocular Movements: Extraocular movements intact.     Conjunctiva/sclera: Conjunctivae normal.     Pupils: Pupils are equal, round, and reactive to light.  Cardiovascular:     Rate and Rhythm: Normal rate and regular rhythm.  Pulmonary:     Effort: Pulmonary effort is normal.     Breath sounds: Normal breath sounds.  Abdominal:     General: Bowel sounds are normal.  Musculoskeletal:     Left elbow: Normal range of motion. Tenderness present in lateral epicondyle.       Arms:     Cervical back: No rigidity or tenderness.  Lymphadenopathy:     Cervical: No cervical adenopathy.  Skin:    General: Skin is warm and dry.  Neurological:     Mental Status: She is alert and oriented to person, place, and time.  Psychiatric:        Mood and Affect: Mood normal.        Behavior: Behavior normal.    BP 118/80    Pulse 81    Temp 97.9 F (36.6 C) (Temporal)    Ht 5\' 4"  (1.626 m)    Wt 178 lb 6.4 oz (80.9 kg)    SpO2 94%    BMI 30.62 kg/m   Wt Readings from Last 3 Encounters:  04/01/21 178 lb 6.4 oz (80.9 kg)  12/07/20 165 lb (74.8 kg)  11/25/20 172 lb 6.4 oz (78.2 kg)     There are no preventive care reminders to display for this patient.   There are no preventive care reminders to display for this patient.  Lab Results  Component Value Date   TSH 1.46 11/08/2020   Lab Results  Component Value Date   WBC 4.5 03/04/2019   HGB 11.6 (L) 03/04/2019   HCT 35.0 (L) 03/04/2019   MCV 83.1 03/04/2019   PLT 169.0 03/04/2019   Lab Results  Component Value Date   NA 135 04/01/2021   K 3.3 (L) 04/01/2021   CO2 35 (H) 04/01/2021   GLUCOSE 302 (H) 04/01/2021   BUN 19 04/01/2021   CREATININE 0.79 04/01/2021   BILITOT 0.5 03/04/2019   ALKPHOS 53 03/04/2019   AST 17 03/04/2019   ALT 14 03/04/2019   PROT 7.1 03/04/2019   ALBUMIN 4.2 03/04/2019   CALCIUM 9.7 04/01/2021   ANIONGAP 8 11/26/2017   GFR 80.13 04/01/2021   Lab Results  Component Value Date   CHOL 163 04/01/2021   Lab Results  Component Value Date   HDL 66.40 04/01/2021   Lab Results  Component Value Date   LDLCALC 77 04/01/2021   Lab Results  Component Value Date   TRIG 101.0 04/01/2021   Lab Results  Component Value Date   CHOLHDL 2 04/01/2021   Lab Results  Component Value Date   HGBA1C 10.7 (H) 04/01/2021      Assessment & Plan:   Problem List Items Addressed This Visit       Endocrine   Type 2 diabetes mellitus with complication (HCC) - Primary   Relevant Medications   rosuvastatin (CRESTOR) 10 MG tablet   metFORMIN (GLUCOPHAGE-XR) 500 MG 24 hr tablet   Other Relevant Orders   Hemoglobin A1c (Completed)   Basic metabolic panel (Completed)   Microalbumin / creatinine urine ratio (Completed)     Musculoskeletal and Integument   Lateral epicondylitis of left elbow     Other   Mixed hyperlipidemia   Relevant Medications   rosuvastatin (CRESTOR) 10 MG tablet   Other Relevant Orders   LDL cholesterol, direct (Completed)    Lipid panel (Completed)   Hypokalemia   Relevant Medications   potassium chloride SA (KLOR-CON M) 20 MEQ tablet   Other Relevant Orders   Potassium    Meds ordered this encounter  Medications   rosuvastatin (CRESTOR) 10 MG tablet    Sig: Take 1 tablet (10 mg total) by mouth daily.    Dispense:  90 tablet    Refill:  3   DISCONTD: metFORMIN (GLUCOPHAGE-XR) 500 MG 24 hr tablet    Sig: Take 1 tablet (500 mg total) by mouth daily with breakfast.    Dispense:  180 tablet  Refill:  1   metFORMIN (GLUCOPHAGE-XR) 500 MG 24 hr tablet    Sig: Take 1 tablet (500 mg total) by mouth 2 (two) times daily at 10 AM and 5 PM.    Dispense:  180 tablet    Refill:  1   potassium chloride SA (KLOR-CON M) 20 MEQ tablet    Sig: Take 1 tablet (20 mEq total) by mouth daily.    Dispense:  30 tablet    Refill:  0    Follow-up: Return in about 6 months (around 09/29/2021), or Sports med referral if not improved in 3 weeks..  Medication adjustment made as needed.  Will purchase an tennis elbow strap.  Demonstrated proper fitting.  She will use Voltaren gel 3 times daily.  Libby Maw, MD

## 2021-04-01 NOTE — Addendum Note (Signed)
Addended by: Jon Billings on: 04/01/2021 12:46 PM   Modules accepted: Orders

## 2021-04-01 NOTE — Addendum Note (Signed)
Addended by: Andrez Grime on: 04/01/2021 12:53 PM   Modules accepted: Orders

## 2021-04-04 ENCOUNTER — Encounter: Payer: Self-pay | Admitting: Family Medicine

## 2021-04-04 ENCOUNTER — Telehealth (INDEPENDENT_AMBULATORY_CARE_PROVIDER_SITE_OTHER): Payer: BC Managed Care – PPO | Admitting: Family Medicine

## 2021-04-04 VITALS — Ht 64.0 in | Wt 177.0 lb

## 2021-04-04 DIAGNOSIS — J3081 Allergic rhinitis due to animal (cat) (dog) hair and dander: Secondary | ICD-10-CM | POA: Insufficient documentation

## 2021-04-04 DIAGNOSIS — J309 Allergic rhinitis, unspecified: Secondary | ICD-10-CM | POA: Insufficient documentation

## 2021-04-04 DIAGNOSIS — J4540 Moderate persistent asthma, uncomplicated: Secondary | ICD-10-CM | POA: Insufficient documentation

## 2021-04-04 DIAGNOSIS — E118 Type 2 diabetes mellitus with unspecified complications: Secondary | ICD-10-CM | POA: Diagnosis not present

## 2021-04-04 DIAGNOSIS — L209 Atopic dermatitis, unspecified: Secondary | ICD-10-CM | POA: Insufficient documentation

## 2021-04-04 DIAGNOSIS — J301 Allergic rhinitis due to pollen: Secondary | ICD-10-CM | POA: Insufficient documentation

## 2021-04-04 MED ORDER — FREESTYLE LIBRE READER DEVI: 6 refills | Status: DC

## 2021-04-04 MED ORDER — FREESTYLE LIBRE SENSOR SYSTEM MISC: 1 refills | Status: DC

## 2021-04-04 NOTE — Progress Notes (Signed)
Established Patient Office Visit  Subjective:  Patient ID: Karen Barnes, female    DOB: [birth year]  Age: 61 y.o. MRN: [id number]  CC:  Chief Complaint  Patient presents with   Advice Only    Discuss labs, patient would also like a prescription for [facility].     HPI Karen Barnes presents for follow-up of her last visit.  Hemoglobin A1c increased by 4 points.  She had gained 13 pounds.  She was under stress because her father passed during that time.  She has since started exercising again.  She is minding her carbohydrates.  She has increased the potassium in her diet.  Past Medical History:  Diagnosis Date   Asthma, moderate persistent    sees Dr Irena Cords   Atrial tachycardia [facility])    Chronic diastolic CHF (congestive heart failure) (HCC)    COVID-19 virus infection 01/2019   Diabetes mellitus    A1C 6.2---->  2009   Family history of adverse reaction to anesthesia    sister had cardiac arrest with rhinoplasty   Fibrocystic breast    GERD (gastroesophageal reflux disease)    HH (hiatus hernia) 09/2014   Dr. Ewing Schlein   History of echocardiogram    Echo 5/19: EF 55-60, normal wall motion, PASP 41   History of hiatal hernia    History of nuclear stress test    Nuclear stress test 5/19:  EF 64, normal perfusion, Low risk   Hot flashes    Hyperlipidemia    Premature atrial contractions    Pulmonary hypertension (HCC)    PVC's (premature ventricular contractions)    Recurrent cystitis    took  postcoital antibiotic for years, symptoms better after her hysterectomy 1996, symptoms re-surface in 2011   RUQ abdominal pain 2016   EGD showed small HH, otherwise normal   Wears glasses     Past Surgical History:  Procedure Laterality Date   ABDOMINAL HYSTERECTOMY  1996   no oophorectomy, d/tendometriosis and fibroids approximately 1996   BREAST EXCISIONAL BIOPSY Right 03/12/2014   [facility]   BREAST LUMPECTOMY WITH RADIOACTIVE SEED LOCALIZATION Right 03/12/2014    Procedure: BREAST LUMPECTOMY WITH RADIOACTIVE SEED LOCALIZATION;  Surgeon: Harriette Bouillon, MD;  Location: [facility];  Service: General;  Laterality: Right;   BREAST SURGERY     Lumpectomy-- BENIGN    CATARACT EXTRACTION Bilateral 2011   CESAREAN SECTION     COSMETIC SURGERY  01/2017   [facility]isa Touch w/ GYN   DILATION AND CURETTAGE OF UTERUS     LIPOMA EXCISION  1978   RIGHT HEART CATH N/A 11/26/2017   Procedure: RIGHT HEART CATH;  Surgeon: Laurey Morale, MD;  Location: [facility] INVASIVE CV LAB;  Service: Cardiovascular;  Laterality: N/A;   UPPER GASTROINTESTINAL ENDOSCOPY     UPPER GI ENDOSCOPY  09/11/2014   Small Hiatus Hernia, Dr. Ewing Schlein    Family History  Problem Relation Age of Onset   Mitral valve prolapse Mother    Breast cancer Mother 46       M had ca insitu (age 60), aunt    Dementia Mother    Heart disease Father 81       early    Diabetes Father    Kidney failure Father    Sleep apnea Father    Brain cancer Other        2 fam members    Lung cancer Other        uncle, heavy smoker  Breast cancer Maternal Aunt 80   CAD Paternal Grandmother 2   CAD Paternal Grandfather 31   Colon cancer Neg Hx     Social History   Socioeconomic History   Marital status: Married    Spouse name: Not on file   Number of children: 1   Years of education: College   Highest education level: Not on file  Occupational History   Occupation: retired principal  Tobacco Use   Smoking status: Never   Smokeless tobacco: Never  Vaping Use   Vaping Use: Never used  Substance and Sexual Activity   Alcohol use: No   Drug use: No   Sexual activity: Yes  Other Topics Concern   Not on file  Social History Narrative   Household-- pt and husband    Rare caffeine use   Daughter independent, 1 G-child   Engineer, structural for F and M, both w/ dementia   Social Determinants of Health   Financial Resource Strain: Not on file  Food Insecurity: Not on file  Transportation Needs: Not  on file  Physical Activity: Not on file  Stress: Not on file  Social Connections: Not on file  Intimate Partner Violence: Not on file    Outpatient Medications Prior to Visit  Medication Sig Dispense Refill   albuterol (VENTOLIN HFA) 108 (90 Base) MCG/ACT inhaler INHALE 2 PUFFS INTO THE LUNGS EVERY 6 HOURS AS NEEDED 6.7 g 5   aspirin EC 81 MG tablet Take 81 mg by mouth daily.     Calcium Carbonate-Vitamin D 600-200 MG-UNIT TABS Take 1 tablet by mouth daily.     chlorhexidine (PERIDEX) 0.12 % solution SMARTSIG:By Mouth     DUPIXENT 300 MG/2ML prefilled syringe INJECT 1 SYRINGE UNDER THE SKIN EVERY OTHER WEEK 12 mL 10   fluticasone (FLONASE) 50 MCG/ACT nasal spray SHAKE LIQUID AND USE 2 SPRAYS IN EACH NOSTRIL TWICE DAILY 16 g 5   furosemide (LASIX) 20 MG tablet Take 1 tablet (20MG ) by mouth daily as needed for fluid retention or swelling. Please keep upcoming appt with Dr. Shari Prows in April 2023 before anymore refills. Thank you Final attempt 30 tablet 1   metFORMIN (GLUCOPHAGE-XR) 500 MG 24 hr tablet Take 1 tablet (500 mg total) by mouth 2 (two) times daily at 10 AM and 5 PM. 180 tablet 1   montelukast (SINGULAIR) 10 MG tablet TAKE 1 TABLET BY MOUTH EVERY DAY 90 tablet 1   Multiple Vitamins-Minerals (MULTIVITAMIN WITH MINERALS) tablet Take 1 tablet by mouth daily.     ondansetron (ZOFRAN-ODT) 4 MG disintegrating tablet Take 1 tablet (4 mg total) by mouth every 8 (eight) hours as needed for nausea or vomiting. 20 tablet 0   pantoprazole (PROTONIX) 40 MG tablet TAKE 1 TABLET(40 MG) BY MOUTH DAILY (Patient taking differently: daily. TAKE 1 TABLET(40 MG) BY MOUTH DAILY) 90 tablet 3   potassium chloride SA (KLOR-CON M) 20 MEQ tablet Take 1 tablet (20 mEq total) by mouth daily. 30 tablet 0   Probiotic Product (ALIGN) 4 MG CAPS      rosuvastatin (CRESTOR) 10 MG tablet Take 1 tablet (10 mg total) by mouth daily. 90 tablet 3   sodium chloride HYPERTONIC 3 % nebulizer solution Take by nebulization  twice daily as needed 750 mL 12   SYMBICORT 160-4.5 MCG/ACT inhaler INHALE 2 PUFFS BY MOUTH TWICE DAILY 10.2 g 5   Tiotropium Bromide Monohydrate (SPIRIVA RESPIMAT) 1.25 MCG/ACT AERS INHALE 2 PUFFS BY MOUTH EVERY DAY 4 g 5   spironolactone (  ALDACTONE) 25 MG tablet Take 0.5 tablets (12.5 mg total) by mouth daily. Please make overdue appt with Dr. Shari Prows new Cardiologist before anymore refills. Thank you 2nd attempt (Patient not taking: Reported on 04/04/2021) 15 tablet 0   sodium chloride HYPERTONIC 3 % nebulizer solution INHALE CONTENTS OF 1 VIAL VIA NEBULIZER TWICE DAILY AS DIRECTED. 240 mL 12   No facility-administered medications prior to visit.    Allergies  Allergen Reactions   Butorphanol Tartrate Other (See Comments)    REACTION: hallucinations   Hydrocodone Other (See Comments)    HALLUCINATIONS   Roxicet [Oxycodone-Acetaminophen] Other (See Comments)    HALLUCINATIONS   Doxycycline     DOES NOT WORK FOR PATIENT   Penicillins Hives    Occurred at age 37/CHILDHOOD ALLERGY Has patient had a PCN reaction causing immediate rash, facial/tongue/throat swelling, SOB or lightheadedness with hypotension: Unknown Has patient had a PCN reaction causing severe rash involving mucus membranes or skin necrosis: Unknown Has patient had a PCN reaction that required hospitalization: Unknown Has patient had a PCN reaction occurring within the last 10 years: No If all of the above answers are "NO", then may proceed with Cephalosporin use.     ROS Review of Systems  Constitutional: Negative.   Respiratory: Negative.    Cardiovascular: Negative.   Gastrointestinal: Negative.   Endocrine: Negative for polyphagia and polyuria.  Genitourinary:  Negative for frequency.  Neurological:  Negative for speech difficulty and weakness.  Psychiatric/Behavioral: Negative.       Objective:    Physical Exam Vitals and nursing note reviewed.  Constitutional:      General: She is not in acute  distress.    Appearance: Normal appearance. She is not ill-appearing, toxic-appearing or diaphoretic.  HENT:     Head: Normocephalic and atraumatic.     Right Ear: External ear normal.     Left Ear: External ear normal.  Eyes:     General: No scleral icterus.       Right eye: No discharge.        Left eye: No discharge.     Extraocular Movements: Extraocular movements intact.     Conjunctiva/sclera: Conjunctivae normal.  Pulmonary:     Effort: Pulmonary effort is normal.  Neurological:     Mental Status: She is alert and oriented to person, place, and time.  Psychiatric:        Mood and Affect: Mood normal.        Behavior: Behavior normal.    Ht 5\' 4"  (1.626 m)    Wt 177 lb (80.3 kg)    BMI 30.38 kg/m  Wt Readings from Last 3 Encounters:  04/04/21 177 lb (80.3 kg)  04/01/21 178 lb 6.4 oz (80.9 kg)  12/07/20 165 lb (74.8 kg)     There are no preventive care reminders to display for this patient.  There are no preventive care reminders to display for this patient.  Lab Results  Component Value Date   TSH 1.46 11/08/2020   Lab Results  Component Value Date   WBC 4.5 03/04/2019   HGB 11.6 (L) 03/04/2019   HCT 35.0 (L) 03/04/2019   MCV 83.1 03/04/2019   PLT 169.0 03/04/2019   Lab Results  Component Value Date   NA 135 04/01/2021   K 3.3 (L) 04/01/2021   CO2 35 (H) 04/01/2021   GLUCOSE 302 (H) 04/01/2021   BUN 19 04/01/2021   CREATININE 0.79 04/01/2021   BILITOT 0.5 03/04/2019   ALKPHOS 53 03/04/2019  AST 17 03/04/2019   ALT 14 03/04/2019   PROT 7.1 03/04/2019   ALBUMIN 4.2 03/04/2019   CALCIUM 9.7 04/01/2021   ANIONGAP 8 11/26/2017   GFR 80.13 04/01/2021   Lab Results  Component Value Date   CHOL 163 04/01/2021   Lab Results  Component Value Date   HDL 66.40 04/01/2021   Lab Results  Component Value Date   LDLCALC 77 04/01/2021   Lab Results  Component Value Date   TRIG 101.0 04/01/2021   Lab Results  Component Value Date   CHOLHDL 2  04/01/2021   Lab Results  Component Value Date   HGBA1C 10.7 (H) 04/01/2021      Assessment & Plan:   Problem List Items Addressed This Visit       Endocrine   Type 2 diabetes mellitus with complication (HCC) - Primary   Relevant Medications   Continuous Blood Gluc Sensor (FREESTYLE LIBRE SENSOR SYSTEM) MISC   Continuous Blood Gluc Receiver (FREESTYLE LIBRE READER) DEVI    Meds ordered this encounter  Medications   Continuous Blood Gluc Sensor (FREESTYLE LIBRE SENSOR SYSTEM) MISC    Sig: Use as directed.    Dispense:  1 each    Refill:  1   Continuous Blood Gluc Receiver (FREESTYLE LIBRE READER) DEVI    Sig: Change every 14 days.    Dispense:  6 each    Refill:  6    Follow-up: Return in about 3 months (around 07/02/2021). Patient is motivated to restart a healthy diet and exercise.  She request a refill on the freestyle libre sensor system.  We will hold treatment to Glucophage Exar 500 twice daily for now.  She will follow-up in 3 months. Mliss SaxWilliam Alfred Tanzie Rothschild, MD  Virtual Visit via Video Note  I connected with Karen Barnes on 04/04/21 at  4:00 PM EST by a video enabled telemedicine application and verified that I am speaking with the correct person using two identifiers.  Location: Patient: in car with grand daughter.  Provider: Work 1 day what I am sorry once in the 1 and 1 in the morning and 2 at night   I discussed the limitations of evaluation and management by telemedicine and the availability of in person appointments. The patient expressed understanding and agreed to proceed.  History of Present Illness:    Observations/Objective:   Assessment and Plan:   Follow Up Instructions:    I discussed the assessment and treatment plan with the patient. The patient was provided an opportunity to ask questions and all were answered. The patient agreed with the plan and demonstrated an understanding of the instructions.   The patient was advised to call  back or seek an in-person evaluation if the symptoms worsen or if the condition fails to improve as anticipated.  I provided 25 0.5 minutes of non-face-to-face time during this encounter.   Mliss SaxWilliam Alfred Georgene Kopper, MD

## 2021-04-05 ENCOUNTER — Encounter: Payer: Self-pay | Admitting: Family Medicine

## 2021-04-06 ENCOUNTER — Other Ambulatory Visit: Payer: Self-pay

## 2021-04-06 DIAGNOSIS — E118 Type 2 diabetes mellitus with unspecified complications: Secondary | ICD-10-CM

## 2021-04-06 MED ORDER — DEXCOM G6 SENSOR MISC: 1.0000 | 6 refills | Status: DC

## 2021-04-06 MED ORDER — DEXCOM G6 RECEIVER DEVI: 1.0000 | 4 refills | Status: DC

## 2021-04-07 ENCOUNTER — Other Ambulatory Visit: Payer: Self-pay

## 2021-04-07 ENCOUNTER — Telehealth: Payer: Self-pay | Admitting: Family Medicine

## 2021-04-07 DIAGNOSIS — E118 Type 2 diabetes mellitus with unspecified complications: Secondary | ICD-10-CM

## 2021-04-07 MED ORDER — FREESTYLE LIBRE 2 SENSOR MISC: 1.0000 | 6 refills | Status: DC

## 2021-04-07 NOTE — Telephone Encounter (Signed)
Pt is needing Libra ll system, it does not need a reader and insurance will cover it. She is having a hard time getting Dexcom system because of insurance. Same pharmacy ?

## 2021-04-07 NOTE — Telephone Encounter (Signed)
Spoke with patient went over request for blood glucose readers and sensors. Patient requesting Rx for [facility] 2, sensor only per patient her insurance would not cover the Dexcom or first Karen Barnes that was sent in these have been canceled at patients pharmacy and new Rx sent in for [place] 2. Patient aware and will pick up.  ?

## 2021-04-14 ENCOUNTER — Ambulatory Visit: Payer: BC Managed Care – PPO | Admitting: Pulmonary Disease

## 2021-04-14 ENCOUNTER — Other Ambulatory Visit: Payer: Self-pay

## 2021-04-14 ENCOUNTER — Encounter: Payer: Self-pay | Admitting: Pulmonary Disease

## 2021-04-14 VITALS — BP 132/82 | HR 73 | Temp 98.2°F | Ht 64.0 in | Wt 177.8 lb

## 2021-04-14 DIAGNOSIS — J040 Acute laryngitis: Secondary | ICD-10-CM

## 2021-04-14 DIAGNOSIS — J45901 Unspecified asthma with (acute) exacerbation: Secondary | ICD-10-CM

## 2021-04-14 DIAGNOSIS — J471 Bronchiectasis with (acute) exacerbation: Secondary | ICD-10-CM

## 2021-04-14 MED ORDER — ONDANSETRON 4 MG PO TBDP: 4.0000 mg | ORAL_TABLET | Freq: Three times a day (TID) | ORAL | 0 refills | Status: AC | PRN

## 2021-04-14 MED ORDER — PREDNISONE 10 MG PO TABS: ORAL_TABLET | ORAL | 0 refills | Status: DC

## 2021-04-14 MED ORDER — LEVOFLOXACIN 500 MG PO TABS: 500.0000 mg | ORAL_TABLET | Freq: Every day | ORAL | 0 refills | Status: DC

## 2021-04-14 NOTE — Progress Notes (Signed)
Synopsis: Referred in March 2023 for shortness of breath cough by Mliss Sax,*  Subjective:   PATIENT ID: Karen Barnes GENDER: female DOB: [birth year], MRN: [id number]  Chief Complaint  Patient presents with   Follow-up    Follow up    This is a 61 year old female with a past medical history of severe asthma on Dupixent Symbicort, Spiriva as well as as needed albuterol.  Unfortunately has developed a upper respiratory tract infection.  This is been going on now for greater than a week she has increased chest tightness shortness of breath.  Now ongoing exacerbation of her asthma.  Presents today for this decline over the past week with increased cough and congestion.     Past Medical History:  Diagnosis Date   Asthma, moderate persistent    sees Dr Irena Cords   Atrial tachycardia [facility])    Chronic diastolic CHF (congestive heart failure) (HCC)    COVID-19 virus infection 01/2019   Diabetes mellitus    A1C 6.2---->  2009   Family history of adverse reaction to anesthesia    sister had cardiac arrest with rhinoplasty   Fibrocystic breast    GERD (gastroesophageal reflux disease)    HH (hiatus hernia) 09/2014   Dr. Ewing Schlein   History of echocardiogram    Echo 5/19: EF 55-60, normal wall motion, PASP 41   History of hiatal hernia    History of nuclear stress test    Nuclear stress test 5/19:  EF 64, normal perfusion, Low risk   Hot flashes    Hyperlipidemia    Premature atrial contractions    Pulmonary hypertension (HCC)    PVC's (premature ventricular contractions)    Recurrent cystitis    took  postcoital antibiotic for years, symptoms better after her hysterectomy 1996, symptoms re-surface in 2011   RUQ abdominal pain 2016   EGD showed small HH, otherwise normal   Wears glasses      Family History  Problem Relation Age of Onset   Mitral valve prolapse Mother    Breast cancer Mother 37       M had ca insitu (age 99), aunt    Dementia Mother    Heart  disease Father 62       early    Diabetes Father    Kidney failure Father    Sleep apnea Father    Brain cancer Other        2 fam members    Lung cancer Other        uncle, heavy smoker   Breast cancer Maternal Aunt 77   CAD Paternal Grandmother 30   CAD Paternal Grandfather 63   Colon cancer Neg Hx      Past Surgical History:  Procedure Laterality Date   ABDOMINAL HYSTERECTOMY  1996   no oophorectomy, d/tendometriosis and fibroids approximately 1996   BREAST EXCISIONAL BIOPSY Right 03/12/2014   [facility]   BREAST LUMPECTOMY WITH RADIOACTIVE SEED LOCALIZATION Right 03/12/2014   Procedure: BREAST LUMPECTOMY WITH RADIOACTIVE SEED LOCALIZATION;  Surgeon: Harriette Bouillon, MD;  Location: [facility];  Service: General;  Laterality: Right;   BREAST SURGERY     Lumpectomy-- BENIGN    CATARACT EXTRACTION Bilateral 2011   CESAREAN SECTION     COSMETIC SURGERY  01/2017   [facility] Touch w/ GYN   DILATION AND CURETTAGE OF UTERUS     LIPOMA EXCISION  1978   RIGHT HEART CATH N/A 11/26/2017   Procedure: RIGHT HEART CATH;  Surgeon: Laurey MoraleMcLean, Dalton S, MD;  [facility];  Service: Cardiovascular;  Laterality: N/A;   UPPER GASTROINTESTINAL ENDOSCOPY     UPPER GI ENDOSCOPY  09/11/2014   Small Hiatus Hernia, Dr. Ewing SchleinMagod    Social History   Socioeconomic History   Marital status: Married    Spouse name: Not on file   Number of children: 1   Years of education: College   Highest education level: Not on file  Occupational History   Occupation: retired principal  Tobacco Use   Smoking status: Never   Smokeless tobacco: Never  Vaping Use   Vaping Use: Never used  Substance and Sexual Activity   Alcohol use: No   Drug use: No   Sexual activity: Yes  Other Topics Concern   Not on file  Social History Narrative   Household-- pt and husband    Rare caffeine use   Daughter independent, 1 G-child   Engineer, structuralCaregiver for F and M, both w/ dementia   Social Determinants of  Health   Financial Resource Strain: Not on file  Food Insecurity: Not on file  Transportation Needs: Not on file  Physical Activity: Not on file  Stress: Not on file  Social Connections: Not on file  Intimate Partner Violence: Not on file     Allergies  Allergen Reactions   Butorphanol Tartrate Other (See Comments)    REACTION: hallucinations   Hydrocodone Other (See Comments)    HALLUCINATIONS   Roxicet [Oxycodone-Acetaminophen] Other (See Comments)    HALLUCINATIONS   Doxycycline     DOES NOT WORK FOR PATIENT   Penicillins Hives    Occurred at age 63/CHILDHOOD ALLERGY Has patient had a PCN reaction causing immediate rash, facial/tongue/throat swelling, SOB or lightheadedness with hypotension: Unknown Has patient had a PCN reaction causing severe rash involving mucus membranes or skin necrosis: Unknown Has patient had a PCN reaction that required hospitalization: Unknown Has patient had a PCN reaction occurring within the last 10 years: No If all of the above answers are "NO", then may proceed with Cephalosporin use.      Outpatient Medications Prior to Visit  Medication Sig Dispense Refill   albuterol (VENTOLIN HFA) 108 (90 Base) MCG/ACT inhaler INHALE 2 PUFFS INTO THE LUNGS EVERY 6 HOURS AS NEEDED 6.7 g 5   aspirin EC 81 MG tablet Take 81 mg by mouth daily.     Calcium Carbonate-Vitamin D 600-200 MG-UNIT TABS Take 1 tablet by mouth daily.     chlorhexidine (PERIDEX) 0.12 % solution SMARTSIG:By Mouth     Continuous Blood Gluc Sensor (FREESTYLE LIBRE 2 SENSOR) MISC by Does not apply route.     Continuous Blood Gluc Sensor (FREESTYLE LIBRE 2 SENSOR) MISC 1 each by Does not apply route every 14 (fourteen) days. 1 each 6   DUPIXENT 300 MG/2ML prefilled syringe INJECT 1 SYRINGE UNDER THE SKIN EVERY OTHER WEEK 12 mL 10   fluticasone (FLONASE) 50 MCG/ACT nasal spray SHAKE LIQUID AND USE 2 SPRAYS IN EACH NOSTRIL TWICE DAILY 16 g 5   furosemide (LASIX) 20 MG tablet Take 1 tablet  (20MG ) by mouth daily as needed for fluid retention or swelling. Please keep upcoming appt with Dr. Shari ProwsPemberton in April 2023 before anymore refills. Thank you Final attempt 30 tablet 1   metFORMIN (GLUCOPHAGE-XR) 500 MG 24 hr tablet Take 1 tablet (500 mg total) by mouth 2 (two) times daily at 10 AM and 5 PM. 180 tablet 1   montelukast (SINGULAIR) 10 MG  tablet TAKE 1 TABLET BY MOUTH EVERY DAY 90 tablet 1   Multiple Vitamins-Minerals (MULTIVITAMIN WITH MINERALS) tablet Take 1 tablet by mouth daily.     ondansetron (ZOFRAN-ODT) 4 MG disintegrating tablet Take 1 tablet (4 mg total) by mouth every 8 (eight) hours as needed for nausea or vomiting. 20 tablet 0   pantoprazole (PROTONIX) 40 MG tablet TAKE 1 TABLET(40 MG) BY MOUTH DAILY (Patient taking differently: daily. TAKE 1 TABLET(40 MG) BY MOUTH DAILY) 90 tablet 3   potassium chloride SA (KLOR-CON M) 20 MEQ tablet Take 1 tablet (20 mEq total) by mouth daily. 30 tablet 0   Probiotic Product (ALIGN) 4 MG CAPS      rosuvastatin (CRESTOR) 10 MG tablet Take 1 tablet (10 mg total) by mouth daily. 90 tablet 3   sodium chloride HYPERTONIC 3 % nebulizer solution Take by nebulization twice daily as needed 750 mL 12   SYMBICORT 160-4.5 MCG/ACT inhaler INHALE 2 PUFFS BY MOUTH TWICE DAILY 10.2 g 5   Tiotropium Bromide Monohydrate (SPIRIVA RESPIMAT) 1.25 MCG/ACT AERS INHALE 2 PUFFS BY MOUTH EVERY DAY 4 g 5   Continuous Blood Gluc Receiver (DEXCOM G6 RECEIVER) DEVI 1 each by Does not apply route as directed. 1 each 4   Continuous Blood Gluc Receiver (FREESTYLE LIBRE READER) DEVI Change every 14 days. 6 each 6   spironolactone (ALDACTONE) 25 MG tablet Take 0.5 tablets (12.5 mg total) by mouth daily. Please make overdue appt with Dr. Shari Prows new Cardiologist before anymore refills. Thank you 2nd attempt (Patient not taking: Reported on 04/04/2021) 15 tablet 0   No facility-administered medications prior to visit.    Review of Systems  Constitutional:  Negative for  chills, fever, malaise/fatigue and weight loss.  HENT:  Negative for hearing loss, sore throat and tinnitus.   Eyes:  Negative for blurred vision and double vision.  Respiratory:  Positive for cough, sputum production, shortness of breath and wheezing. Negative for hemoptysis and stridor.   Cardiovascular:  Negative for chest pain, palpitations, orthopnea, leg swelling and PND.  Gastrointestinal:  Negative for abdominal pain, constipation, diarrhea, heartburn, nausea and vomiting.  Genitourinary:  Negative for dysuria, hematuria and urgency.  Musculoskeletal:  Negative for joint pain and myalgias.  Skin:  Negative for itching and rash.  Neurological:  Negative for dizziness, tingling, weakness and headaches.  Endo/Heme/Allergies:  Negative for environmental allergies. Does not bruise/bleed easily.  Psychiatric/Behavioral:  Negative for depression. The patient is not nervous/anxious and does not have insomnia.   All other systems reviewed and are negative.   Objective:  Physical Exam Vitals reviewed.  Constitutional:      General: She is not in acute distress.    Appearance: She is well-developed.  HENT:     Head: Normocephalic and atraumatic.  Eyes:     General: No scleral icterus.    Conjunctiva/sclera: Conjunctivae normal.     Pupils: Pupils are equal, round, and reactive to light.  Neck:     Vascular: No JVD.     Trachea: No tracheal deviation.  Cardiovascular:     Rate and Rhythm: Normal rate and regular rhythm.     Heart sounds: Normal heart sounds. No murmur heard. Pulmonary:     Effort: Pulmonary effort is normal. No tachypnea, accessory muscle usage or respiratory distress.     Breath sounds: No stridor. No wheezing, rhonchi or rales.     Comments: Tight respiratory excursion bilaterally Abdominal:     General: There is no distension.  Palpations: Abdomen is soft.     Tenderness: There is no abdominal tenderness.  Musculoskeletal:        General: No tenderness.      Cervical back: Neck supple.  Lymphadenopathy:     Cervical: No cervical adenopathy.  Skin:    General: Skin is warm and dry.     Capillary Refill: Capillary refill takes less than 2 seconds.     Findings: No rash.  Neurological:     Mental Status: She is alert and oriented to person, place, and time.  Psychiatric:        Behavior: Behavior normal.     Vitals:   04/14/21 1159  BP: 132/82  Pulse: 73  Temp: 98.2 F (36.8 C)  TempSrc: Oral  SpO2: 95%  Weight: 177 lb 12.8 oz (80.6 kg)  Height:  (1.626 m)   95% on RA BMI Readings from Last 3 Encounters:  04/14/21 30.52 kg/m  04/04/21 30.38 kg/m  04/01/21 30.62 kg/m   Wt Readings from Last 3 Encounters:  04/14/21 177 lb 12.8 oz (80.6 kg)  04/04/21 177 lb (80.3 kg)  04/01/21 178 lb 6.4 oz (80.9 kg)     CBC    Component Value Date/Time   WBC 4.5 03/04/2019 0801   RBC 4.22 03/04/2019 0801   HGB 11.6 (L) 03/04/2019 0801   HGB 11.8 09/02/2018 0935   HCT 35.0 (L) 03/04/2019 0801   HCT 38.0 09/02/2018 0935   PLT 169.0 03/04/2019 0801   PLT 304 09/02/2018 0935   MCV 83.1 03/04/2019 0801   MCV 81 09/02/2018 0935   MCH 25.0 (L) 09/02/2018 0935   MCH 26.5 11/26/2017 0701   MCHC 33.0 03/04/2019 0801   RDW 17.2 (H) 03/04/2019 0801   RDW 14.1 09/02/2018 0935   LYMPHSABS 1.2 03/04/2019 0801   LYMPHSABS 1.4 09/02/2018 0935   MONOABS 0.4 03/04/2019 0801   EOSABS 0.1 03/04/2019 0801   EOSABS 0.2 09/02/2018 0935   BASOSABS 0.0 03/04/2019 0801   BASOSABS 0.0 09/02/2018 0935     Chest Imaging:   Pulmonary Functions Testing Results: PFT Results Latest Ref Rng & Units 11/13/2017  FVC-Pre L 2.56  FVC-Predicted Pre % 76  FVC-Post L 2.55  FVC-Predicted Post % 76  Pre FEV1/FVC % % 90  Post FEV1/FCV % % 91  FEV1-Pre L 2.32  FEV1-Predicted Pre % 89  FEV1-Post L 2.33  DLCO uncorrected ml/min/mmHg 20.72  DLCO UNC% % 84  DLCO corrected ml/min/mmHg 21.26  DLCO COR %Predicted % 86  DLVA Predicted % 108  TLC L 4.32   TLC % Predicted % 84  RV % Predicted % 76    FeNO:   Pathology:   Echocardiogram:   Heart Catheterization:     Assessment & Plan:     ICD-10-CM   1. Bronchiectasis with acute exacerbation (HCC)  J47.1     2. Asthmatic bronchitis with acute exacerbation, unspecified asthma severity, unspecified whether persistent  J45.901     3. Acute laryngitis  J04.0       Discussion:  This is a 61 year old female with a history of severe asthma on triple therapy inhaler regimen plus Dupixent biologic injections.  Now presents with acute exacerbation of bronchiectasis and acute exacerbation of asthma  Plan: Start prednisone taper Start Levaquin x10 days Prescription for Zofran because she gets nauseated with antibiotics easily.  Follow-up with Dr. Isaiah Serge, primary pulmonologist as scheduled.   Current Outpatient Medications:    albuterol (VENTOLIN HFA) 108 (90 Base) MCG/ACT  inhaler, INHALE 2 PUFFS INTO THE LUNGS EVERY 6 HOURS AS NEEDED, Disp: 6.7 g, Rfl: 5   aspirin EC 81 MG tablet, Take 81 mg by mouth daily., Disp: , Rfl:    Calcium Carbonate-Vitamin D 600-200 MG-UNIT TABS, Take 1 tablet by mouth daily., Disp: , Rfl:    chlorhexidine (PERIDEX) 0.12 % solution, SMARTSIG:By Mouth, Disp: , Rfl:    Continuous Blood Gluc Sensor (FREESTYLE LIBRE 2 SENSOR) MISC, by Does not apply route., Disp: , Rfl:    Continuous Blood Gluc Sensor (FREESTYLE LIBRE 2 SENSOR) MISC, 1 each by Does not apply route every 14 (fourteen) days., Disp: 1 each, Rfl: 6   DUPIXENT 300 MG/2ML prefilled syringe, INJECT 1 SYRINGE UNDER THE SKIN EVERY OTHER WEEK, Disp: 12 mL, Rfl: 10   fluticasone (FLONASE) 50 MCG/ACT nasal spray, SHAKE LIQUID AND USE 2 SPRAYS IN EACH NOSTRIL TWICE DAILY, Disp: 16 g, Rfl: 5   furosemide (LASIX) 20 MG tablet, Take 1 tablet ( ) by mouth daily as needed for fluid retention or swelling. Please keep upcoming appt with Dr. Shari Prows in April 2023 before anymore refills. Thank you Final attempt,  Disp: 30 tablet, Rfl: 1   levofloxacin (LEVAQUIN) 500 MG tablet, Take 1 tablet (500 mg total) by mouth daily., Disp: 10 tablet, Rfl: 0   metFORMIN (GLUCOPHAGE-XR) 500 MG 24 hr tablet, Take 1 tablet (500 mg total) by mouth 2 (two) times daily at 10 AM and 5 PM., Disp: 180 tablet, Rfl: 1   montelukast (SINGULAIR) 10 MG tablet, TAKE 1 TABLET BY MOUTH EVERY DAY, Disp: 90 tablet, Rfl: 1   Multiple Vitamins-Minerals (MULTIVITAMIN WITH MINERALS) tablet, Take 1 tablet by mouth daily., Disp: , Rfl:    ondansetron (ZOFRAN-ODT) 4 MG disintegrating tablet, Take 1 tablet (4 mg total) by mouth every 8 (eight) hours as needed for nausea or vomiting., Disp: 20 tablet, Rfl: 0   ondansetron (ZOFRAN-ODT) 4 MG disintegrating tablet, Take 1 tablet (4 mg total) by mouth every 8 (eight) hours as needed for up to 21 days for nausea or vomiting., Disp: 18 tablet, Rfl: 0   pantoprazole (PROTONIX) 40 MG tablet, TAKE 1 TABLET(40 MG) BY MOUTH DAILY (Patient taking differently: daily. TAKE 1 TABLET(40 MG) BY MOUTH DAILY), Disp: 90 tablet, Rfl: 3   potassium chloride SA (KLOR-CON M) 20 MEQ tablet, Take 1 tablet (20 mEq total) by mouth daily., Disp: 30 tablet, Rfl: 0   predniSONE (DELTASONE) 10 MG tablet, Take 4 tabs by mouth once daily x4 days, then 3 tabs x4 days, 2 tabs x4 days, 1 tab x4 days and stop., Disp: 40 tablet, Rfl: 0   Probiotic Product (ALIGN) 4 MG CAPS, , Disp: , Rfl:    rosuvastatin (CRESTOR) 10 MG tablet, Take 1 tablet (10 mg total) by mouth daily., Disp: 90 tablet, Rfl: 3   sodium chloride HYPERTONIC 3 % nebulizer solution, Take by nebulization twice daily as needed, Disp: 750 mL, Rfl: 12   SYMBICORT 160-4.5 MCG/ACT inhaler, INHALE 2 PUFFS BY MOUTH TWICE DAILY, Disp: 10.2 g, Rfl: 5   Tiotropium Bromide Monohydrate (SPIRIVA RESPIMAT) 1.25 MCG/ACT AERS, INHALE 2 PUFFS BY MOUTH EVERY DAY, Disp: 4 g, Rfl: 5   Josephine Igo, DO [facility] 04/14/2021 12:31 PM

## 2021-04-14 NOTE — Patient Instructions (Signed)
Thank you for visiting Dr. Valeta Harms at [facility]. ?Today we recommend the following: ? ? ?Meds ordered this encounter  ?Medications  ? predniSONE (DELTASONE) 10 MG tablet  ?  Sig: Take 4 tabs by mouth once daily x4 days, then 3 tabs x4 days, 2 tabs x4 days, 1 tab x4 days and stop.  ?  Dispense:  40 tablet  ?  Refill:  0  ? levofloxacin (LEVAQUIN) 500 MG tablet  ?  Sig: Take 1 tablet (500 mg total) by mouth daily.  ?  Dispense:  10 tablet  ?  Refill:  0  ? ondansetron (ZOFRAN-ODT) 4 MG disintegrating tablet  ?  Sig: Take 1 tablet (4 mg total) by mouth every 8 (eight) hours as needed for up to 21 days for nausea or vomiting.  ?  Dispense:  18 tablet  ?  Refill:  0  ? ?Return if symptoms worsen or fail to improve, for w/ Dr. [name] . ? ? ? ?Please do your part to reduce the spread of COVID-19.  ? ?

## 2021-04-18 ENCOUNTER — Other Ambulatory Visit: Payer: Self-pay

## 2021-04-18 ENCOUNTER — Ambulatory Visit (INDEPENDENT_AMBULATORY_CARE_PROVIDER_SITE_OTHER): Payer: BC Managed Care – PPO | Admitting: Nurse Practitioner

## 2021-04-18 ENCOUNTER — Telehealth: Payer: Self-pay

## 2021-04-18 ENCOUNTER — Encounter: Payer: Self-pay | Admitting: Nurse Practitioner

## 2021-04-18 VITALS — BP 110/68 | HR 76 | Temp 97.0°F | Wt 173.8 lb

## 2021-04-18 DIAGNOSIS — E118 Type 2 diabetes mellitus with unspecified complications: Secondary | ICD-10-CM

## 2021-04-18 MED ORDER — SITAGLIPTIN PHOSPHATE 25 MG PO TABS: 25.0000 mg | ORAL_TABLET | Freq: Every day | ORAL | 1 refills | Status: DC

## 2021-04-18 NOTE — Progress Notes (Unsigned)
Acute Office Visit  Subjective:    Patient ID: Karen Barnes, female    DOB: Mar 26, 1960, 61 y.o.   MRN: [id number]  Chief Complaint  Patient presents with   Follow-up    Pt c/o elevated blood sugars. Change in drug therapy    HPI Patient is in today for elevated blood sugars since starting levaquin and prednisone last Thursday. She is having nausea even with taking zofran with levaquin. She has been drinking a lot of water. She did vomit yesterday and did feel better after this. Blood pressure was 400 yesterday   Thursday: 120s-250s Friday: 200s-390s Saturday: 150s-400s Sunday: 250s-300s Today: 100s-310s  Dad passed away, mom in nursing home. Increased stress. She was doing optavia diet.   She has since increased her metformin to 1,000mg  BID due to starting prednisone and levaquin.   She has taken Januvia in the past and tolerated this well.     Past Medical History:  Diagnosis Date   Asthma, moderate persistent    sees Dr Harold Hedge   Atrial tachycardia [facility])    Chronic diastolic CHF (congestive heart failure) ([place])    COVID-19 virus infection 01/2019   Diabetes mellitus    A1C 6.2---->  2009   Family history of adverse reaction to anesthesia    sister had cardiac arrest with rhinoplasty   Fibrocystic breast    GERD (gastroesophageal reflux disease)    HH (hiatus hernia) 09/2014   Dr. [name]   History of echocardiogram    Echo 5/19: EF 55-60, normal wall motion, PASP 41   History of hiatal hernia    History of nuclear stress test    Nuclear stress test 5/19:  EF 64, normal perfusion, Low risk   Hot flashes    Hyperlipidemia    Premature atrial contractions    Pulmonary hypertension ([place])    PVC's (premature ventricular contractions)    Recurrent cystitis    took  postcoital antibiotic for years, symptoms better after her hysterectomy 1996, symptoms re-surface in 2011   RUQ abdominal pain 2016   EGD showed small HH, otherwise normal   Wears glasses      Past Surgical History:  Procedure Laterality Date   ABDOMINAL HYSTERECTOMY  1996   no oophorectomy, d/tendometriosis and fibroids approximately 1996   BREAST EXCISIONAL BIOPSY Right 03/12/2014   [facility]   BREAST LUMPECTOMY WITH RADIOACTIVE SEED LOCALIZATION Right 03/12/2014   Procedure: BREAST LUMPECTOMY WITH RADIOACTIVE SEED LOCALIZATION;  Surgeon: Erroll Luna, MD;  Location: [place];  Service: General;  Laterality: Right;   BREAST SURGERY     Lumpectomy-- BENIGN    CATARACT EXTRACTION Bilateral 2011   CESAREAN SECTION     COSMETIC SURGERY  01/2017   [place] Touch w/ GYN   DILATION AND CURETTAGE OF UTERUS     [place]   RIGHT HEART CATH N/A 11/26/2017   Procedure: RIGHT HEART CATH;  Surgeon: Larey Dresser, MD;  Location: [place] CV [facility];  Service: Cardiovascular;  Laterality: N/A;   UPPER GASTROINTESTINAL ENDOSCOPY     UPPER GI ENDOSCOPY  09/11/2014   Small Hiatus Hernia, Dr. [name]    Family History  Problem Relation Age of Onset   Mitral valve prolapse Mother    Breast cancer Mother 70       M had ca insitu (age 64), aunt    Dementia Mother    Heart disease Father 62       early  Diabetes Father    Kidney failure Father    Sleep apnea Father    Brain cancer Other        2 fam members    Lung cancer Other        uncle, heavy smoker   Breast cancer Maternal Aunt 60   CAD Paternal Grandmother 39   CAD Paternal Grandfather 35   Colon cancer Neg Hx     Social History   Socioeconomic History   Marital status: Married    Spouse name: Not on file   Number of children: 1   Years of education: College   Highest education level: Not on file  Occupational History   Occupation: retired principal  Tobacco Use   Smoking status: Never   Smokeless tobacco: Never  Vaping Use   Vaping Use: Never used  Substance and Sexual Activity   Alcohol use: No   Drug use: No   Sexual activity: Yes  Other Topics Concern   Not on file  Social  History Narrative   Household-- pt and husband    Rare caffeine use   Daughter independent, 1 G-child   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     Calcium Carbonate-Vitamin D 600-200 MG-UNIT TABS Take 1 tablet by mouth daily.     chlorhexidine (PERIDEX) 0.12 % solution SMARTSIG:By Mouth     Continuous Blood Gluc Sensor (FREESTYLE LIBRE 2 SENSOR) MISC by Does not apply route.     Continuous Blood Gluc Sensor (FREESTYLE LIBRE 2 SENSOR) MISC 1 each by Does not apply route every 14 (fourteen) days. 1 each 6   DUPIXENT 300 MG/2ML prefilled syringe INJECT 1 SYRINGE UNDER THE SKIN EVERY OTHER WEEK 12 mL 10   fluticasone (FLONASE) 50 MCG/ACT nasal spray SHAKE LIQUID AND USE 2 SPRAYS IN EACH NOSTRIL TWICE DAILY 16 g 5   furosemide (LASIX) 20 MG tablet Take 1 tablet (20MG ) by mouth daily as needed for fluid retention or swelling. Please keep upcoming appt with Dr. Johney Frame in April 2023 before anymore refills. Thank you Final attempt 30 tablet 1   levofloxacin (LEVAQUIN) 500 MG tablet Take 1 tablet (500 mg total) by mouth daily. 10 tablet 0   metFORMIN (GLUCOPHAGE-XR) 500 MG 24 hr tablet Take 1 tablet (500 mg total) by mouth 2 (two) times daily at 10 AM and 5 PM. 180 tablet 1   montelukast (SINGULAIR) 10 MG tablet TAKE 1 TABLET BY MOUTH EVERY DAY 90 tablet 1   Multiple Vitamins-Minerals (MULTIVITAMIN WITH MINERALS) tablet Take 1 tablet by mouth daily.     ondansetron (ZOFRAN-ODT) 4 MG  disintegrating tablet Take 1 tablet (4 mg total) by mouth every 8 (eight) hours as needed for nausea or vomiting. 20 tablet 0   ondansetron (ZOFRAN-ODT) 4 MG disintegrating tablet Take 1 tablet (4 mg total) by mouth every 8 (eight) hours as needed for up to 21 days for  nausea or vomiting. 18 tablet 0   pantoprazole (PROTONIX) 40 MG tablet TAKE 1 TABLET(40 MG) BY MOUTH DAILY (Patient taking differently: daily. TAKE 1 TABLET(40 MG) BY MOUTH DAILY) 90 tablet 3   potassium chloride SA (KLOR-CON M) 20 MEQ tablet Take 1 tablet (20 mEq total) by mouth daily. 30 tablet 0   predniSONE (DELTASONE) 10 MG tablet Take 4 tabs by mouth once daily x4 days, then 3 tabs x4 days, 2 tabs x4 days, 1 tab x4 days and stop. 40 tablet 0   Probiotic Product (ALIGN) 4 MG CAPS      rosuvastatin (CRESTOR) 10 MG tablet Take 1 tablet (10 mg total) by mouth daily. 90 tablet 3   sodium chloride HYPERTONIC 3 % nebulizer solution Take by nebulization twice daily as needed 750 mL 12   SYMBICORT 160-4.5 MCG/ACT inhaler INHALE 2 PUFFS BY MOUTH TWICE DAILY 10.2 g 5   Tiotropium Bromide Monohydrate (SPIRIVA RESPIMAT) 1.25 MCG/ACT AERS INHALE 2 PUFFS BY MOUTH EVERY DAY 4 g 5   No facility-administered medications prior to visit.    Allergies  Allergen Reactions   Butorphanol Tartrate Other (See Comments)    REACTION: hallucinations   Hydrocodone Other (See Comments)    HALLUCINATIONS   Roxicet [Oxycodone-Acetaminophen] Other (See Comments)    HALLUCINATIONS   Doxycycline     DOES NOT WORK FOR PATIENT   Penicillins Hives    Occurred at age 69/CHILDHOOD ALLERGY Has patient had a PCN reaction causing immediate rash, facial/tongue/throat swelling, SOB or lightheadedness with hypotension: Unknown Has patient had a PCN reaction causing severe rash involving mucus membranes or skin necrosis: Unknown Has patient had a PCN reaction that required hospitalization: Unknown Has patient had a PCN reaction occurring within the last 10 years:  No If all of the above answers are "NO", then may proceed with Cephalosporin use.     Review of Systems     Objective:    Physical Exam  BP 110/68 (BP Location: Right Arm, Patient Position: Sitting, Cuff Size: Normal)    Pulse 76    Temp (!) 97 F (36.1 C) (Temporal)    Wt 173 lb 12.8 oz (78.8 kg)    SpO2 96%    BMI 29.83 kg/m  Wt Readings from Last 3 Encounters:  04/18/21 173 lb 12.8 oz (78.8 kg)  04/14/21 177 lb 12.8 oz (80.6 kg)  04/04/21 177 lb (80.3 kg)    Health Maintenance Due  Topic Date Due   OPHTHALMOLOGY EXAM  04/08/2021    There are no preventive care reminders to display for this patient.   Lab Results  Component Value Date   TSH 1.46 11/08/2020   Lab Results  Component Value Date   WBC 4.5 03/04/2019   HGB 11.6 (L) 03/04/2019   HCT 35.0 (L) 03/04/2019   MCV 83.1 03/04/2019   PLT 169.0 03/04/2019   Lab Results  Component Value Date   NA 135 04/01/2021   K 3.3 (L) 04/01/2021   CO2 35 (H) 04/01/2021   GLUCOSE 302 (H) 04/01/2021   BUN 19 04/01/2021   CREATININE 0.79 04/01/2021   BILITOT 0.5 03/04/2019   ALKPHOS 53 03/04/2019   AST 17 03/04/2019   ALT 14 03/04/2019   PROT 7.1 03/04/2019   ALBUMIN 4.2 03/04/2019   CALCIUM 9.7 04/01/2021   ANIONGAP 8 11/26/2017   GFR 80.13 04/01/2021   Lab Results  Component Value Date   CHOL 163 04/01/2021   Lab Results  Component Value Date  HDL 66.40 04/01/2021   Lab Results  Component Value Date   LDLCALC 77 04/01/2021   Lab Results  Component Value Date   TRIG 101.0 04/01/2021   Lab Results  Component Value Date   CHOLHDL 2 04/01/2021   Lab Results  Component Value Date   HGBA1C 10.7 (H) 04/01/2021       Assessment & Plan:   Problem List Items Addressed This Visit       Endocrine   Type 2 diabetes mellitus with complication ([place]) - Primary     No orders of the defined types were placed in this encounter.    Charyl Dancer, NP

## 2021-04-18 NOTE — Patient Instructions (Signed)
It was great to see you! ? ?Take your prednisone in the morning and levaquin at night. ? ?Keep taking metformin 1,000mg  twice a day and start Januvia 1 tablet daily.  ? ?Keep monitoring your blood sugars.  ? ?Let's follow-up in 2 months, sooner if you have concerns. ? ?If a referral was placed today, you will be contacted for an appointment. Please note that routine referrals can sometimes take up to 3-4 weeks to process. Please call our office if you haven't heard anything after this time frame. ? ?Take care, ? ?Vance Peper, NP ? ?

## 2021-04-18 NOTE — Telephone Encounter (Signed)
Triage nurse called back and said pt needed to be seen today. Dr Doreene Burke had no availability. She will be seeing Karen Barnes today, 04/18/21 ?

## 2021-04-18 NOTE — Telephone Encounter (Signed)
Pt called in saying she had been vomiting for 2 days. Her blood sugar readings were in the 400's yesterday. This morning it was too high for the meter to read.  ?I transferred her to the nurse triage line ?

## 2021-04-19 ENCOUNTER — Ambulatory Visit: Payer: BC Managed Care – PPO | Admitting: Family Medicine

## 2021-04-19 ENCOUNTER — Encounter: Payer: Self-pay | Admitting: Nurse Practitioner

## 2021-04-19 NOTE — Assessment & Plan Note (Addendum)
Chronic, not controlled.  Her last A1c was 10.7% 3 weeks ago, and that she was started on prednisone for a lung infection by her pulmonologist.  Her blood sugars have been ranging from the 150s to greater than 400 and she is monitoring with a continuous glucose monitor.  She has been having severe nausea and some vomiting since starting the Levaquin.  Encouraged her to take her prednisone in the morning and then try to take her Levaquin in the evening with food and Zofran.  She has tolerated Januvia in the past.  With her acute nausea, vomiting we will hold off on starting an injectable medication, like Trulicity.  She also had low potassium noted 3 weeks ago and is on a potassium supplement.  Since she tolerated Januvia in the past we will start this today at 25 mg daily.  Keep taking metformin 1000 mg twice a day.  Once she is over this acute illness, this regimen can be reevaluated.  Encouraged her to keep checking her blood sugars routinely and follow-up if sugars are still not controlled. ?

## 2021-04-30 ENCOUNTER — Encounter: Payer: Self-pay | Admitting: Nurse Practitioner

## 2021-04-30 DIAGNOSIS — E118 Type 2 diabetes mellitus with unspecified complications: Secondary | ICD-10-CM

## 2021-05-01 NOTE — Progress Notes (Signed)
?Cardiology Office Note:   ? ?Date:  05/10/2021  ? ?ID:  Karen Barnes, DOB 29-Dec-1960, MRN [id number] ? ?PCP:  Mliss Sax, MD ?  ?[facility] Providers ?Cardiologist:  Meriam Sprague, MD {  ? ?Referring MD: Mliss Sax,*  ? ? ?History of Present Illness:   ? ?Karen Barnes is a 61 y.o. female with a hx of diabetes, hypertension, hyperlipidemia, palpitations, prior subdural hematoma (2/2 to a mechanical fall in 01/2017), asthma, strong family hx of CAD and Afib who was previously followed by Dr. Delton See who now returns to clinic for follow-up. ? ?Per review of the record, patient has a history of PVCs which were well controlled on dilt. Did not tolerate BB due to asthma. 30-day event monitor from March 2019 showed PACs PVCs and episodes of atrial tachycardia.   ?She underwent right-sided cardiac catheterization in October 2019 that showed no evidence of pulmonary arterial hypertension and only mild pulmonary hypertension most probably secondary to diastolic heart failure.   ? ?She was last seen in clinic on 12/2019 where she was doing well. ? ?Today, the patient overall had been feeling well. About 3 weeks ago, she was on levofloxacin and prednisone due to URI. Was dealing with fluctuating blood glucoses as well at that time. In this setting, she had a prolonged period of palpitations for about in the middle of the night. Symptoms finally resolved without intervention. Prior to this episode, she was having no episodes of palpitations and has no recurrence since that time.  ? ?Otherwise, no chest pain, lightheadedness, dizziness. Has LE edema and she takes lasix and uses compression socks. She remains very active and walks 3 miles per day without issue.  ? ?Past Medical History:  ?Diagnosis Date  ? Asthma, moderate persistent   ? sees Dr Irena Cords  ? Atrial tachycardia ([facility])   ? Chronic diastolic CHF (congestive heart failure) ([facility])   ? COVID-19 virus infection 01/2019  ?  Diabetes mellitus   ? A1C 6.2---->  2009  ? Family history of adverse reaction to anesthesia   ? sister had cardiac arrest with rhinoplasty  ? Fibrocystic breast   ? GERD (gastroesophageal reflux disease)   ? HH (hiatus hernia) 09/2014  ? Dr. Ewing Schlein  ? History of echocardiogram   ? Echo 5/19: EF 55-60, normal wall motion, PASP 41  ? History of hiatal hernia   ? History of nuclear stress test   ? Nuclear stress test 5/19:  EF 64, normal perfusion, Low risk  ? Hot flashes   ? Hyperlipidemia   ? Premature atrial contractions   ? Pulmonary hypertension ([facility])   ? PVC's (premature ventricular contractions)   ? Recurrent cystitis   ? took  postcoital antibiotic for years, symptoms better after her hysterectomy 1996, symptoms re-surface in 2011  ? RUQ abdominal pain 2016  ? EGD showed small HH, otherwise normal  ? Wears glasses   ? ? ?Past Surgical History:  ?Procedure Laterality Date  ? ABDOMINAL HYSTERECTOMY  1996  ? no oophorectomy, d/tendometriosis and fibroids approximately 1996  ? BREAST EXCISIONAL BIOPSY Right 03/12/2014  ? [facility]  ? BREAST LUMPECTOMY WITH RADIOACTIVE SEED LOCALIZATION Right 03/12/2014  ? Procedure: BREAST LUMPECTOMY WITH RADIOACTIVE SEED LOCALIZATION;  Surgeon: Harriette Bouillon, MD;  Location: [facility];  Service: General;  Laterality: Right;  ? BREAST SURGERY    ? Lumpectomy-- BENIGN   ? CATARACT EXTRACTION Bilateral 2011  ? CESAREAN SECTION    ?  COSMETIC SURGERY  01/2017  ? [facility] Touch w/ GYN  ? DILATION AND CURETTAGE OF UTERUS    ? LIPOMA EXCISION  1978  ? RIGHT HEART CATH N/A 11/26/2017  ? Procedure: RIGHT HEART CATH;  Surgeon: Laurey Morale, MD;  Location: [facility];  Service: Cardiovascular;  Laterality: N/A;  ? UPPER GASTROINTESTINAL ENDOSCOPY    ? UPPER GI ENDOSCOPY  09/11/2014  ? Small Hiatus Hernia, Dr. Ewing Schlein  ? ? ?Current Medications: ?Current Meds  ?Medication Sig  ? albuterol (VENTOLIN HFA) 108 (90 Base) MCG/ACT inhaler INHALE 2 PUFFS INTO THE LUNGS EVERY 6 HOURS AS  NEEDED  ? aspirin EC 81 MG tablet Take 81 mg by mouth daily.  ? Calcium Carbonate-Vitamin D 600-200 MG-UNIT TABS Take 1 tablet by mouth daily.  ? chlorhexidine (PERIDEX) 0.12 % solution SMARTSIG:By Mouth  ? Continuous Blood Gluc Sensor (FREESTYLE LIBRE 2 SENSOR) MISC 1 each by Does not apply route every 14 (fourteen) days.  ? dapagliflozin propanediol (FARXIGA) 10 MG TABS tablet Take 1 tablet (10 mg total) by mouth daily before breakfast.  ? DUPIXENT 300 MG/2ML prefilled syringe INJECT 1 SYRINGE UNDER THE SKIN EVERY OTHER WEEK  ? fluticasone (FLONASE) 50 MCG/ACT nasal spray SHAKE LIQUID AND USE 2 SPRAYS IN EACH NOSTRIL TWICE DAILY  ? furosemide (LASIX) 20 MG tablet Take 1 tablet (20MG ) by mouth daily as needed for fluid retention or swelling. Please keep upcoming appt with Dr. in April 2023 before anymore refills. Thank you Final attempt  ? metFORMIN (GLUCOPHAGE) 1000 MG tablet Take 500 mg by mouth 2 (two) times daily with a meal.  ? montelukast (SINGULAIR) 10 MG tablet TAKE 1 TABLET BY MOUTH EVERY DAY  ? Multiple Vitamins-Minerals (MULTIVITAMIN WITH MINERALS) tablet Take 1 tablet by mouth daily.  ? ondansetron (ZOFRAN-ODT) 4 MG disintegrating tablet Take 1 tablet (4 mg total) by mouth every 8 (eight) hours as needed for nausea or vomiting.  ? pantoprazole (PROTONIX) 40 MG tablet TAKE 1 TABLET(40 MG) BY MOUTH DAILY  ? potassium chloride SA (KLOR-CON M) 20 MEQ tablet Take 1 tablet (20 mEq total) by mouth daily.  ? Probiotic Product (ALIGN) 4 MG CAPS   ? rosuvastatin (CRESTOR) 10 MG tablet Take 1 tablet (10 mg total) by mouth daily.  ? sodium chloride HYPERTONIC 3 % nebulizer solution Take by nebulization twice daily as needed  ? spironolactone (ALDACTONE) 25 MG tablet Take 0.5 tablets (12.5 mg total) by mouth daily.  ? SYMBICORT 160-4.5 MCG/ACT inhaler INHALE 2 PUFFS BY MOUTH TWICE DAILY  ? Tiotropium Bromide Monohydrate (SPIRIVA RESPIMAT) 1.25 MCG/ACT AERS INHALE 2 PUFFS BY MOUTH EVERY DAY  ?   ? ?Allergies:   Butorphanol tartrate, Hydrocodone, Roxicet [oxycodone-acetaminophen], Doxycycline, and Penicillins  ? ?Social History  ? ?Socioeconomic History  ? Marital status: Married  ?  Spouse name: Not on file  ? Number of children: 1  ? Years of education: College  ? Highest education level: Not on file  ?Occupational History  ? Occupation: retired principal  ?Tobacco Use  ? Smoking status: Never  ? Smokeless tobacco: Never  ?Vaping Use  ? Vaping Use: Never used  ?Substance and Sexual Activity  ? Alcohol use: No  ? Drug use: No  ? Sexual activity: Yes  ?Other Topics Concern  ? Not on file  ?Social History Narrative  ? Household-- pt and husband   ? Rare caffeine use  ? Daughter independent, 1 G-child  ? Caregiver for F and M, both w/  dementia  ? ?Social Determinants of Health  ? ?Financial Resource Strain: Not on file  ?Food Insecurity: Not on file  ?Transportation Needs: Not on file  ?Physical Activity: Not on file  ?Stress: Not on file  ?Social Connections: Not on file  ?  ? ?Family History: ?The patient's family history includes Brain cancer in an other family member; Breast cancer (age of onset: 5860) in her maternal aunt; Breast cancer (age of onset: 1263) in her mother; CAD (age of onset: 9640) in her paternal grandfather and paternal grandmother; Dementia in her mother; Diabetes in her father; Heart disease (age of onset: 4940) in her father; Kidney failure in her father; Lung cancer in an other family member; Mitral valve prolapse in her mother; Sleep apnea in her father. There is no history of Colon cancer. ? ?ROS:   ?Please see the history of present illness.    ? Review of Systems  ?Constitutional:  Negative for malaise/fatigue.  ?Respiratory:  Positive for wheezing. Negative for shortness of breath.   ?Cardiovascular:  Positive for palpitations and leg swelling. Negative for chest pain, orthopnea, claudication and PND.  ?Gastrointestinal:  Negative for nausea and vomiting.  ?Musculoskeletal:  Negative  for falls.  ?Neurological:  Negative for dizziness and loss of consciousness.   ? ?EKGs/Labs/Other Studies Reviewed:   ? ?The following studies were reviewed today: ?TTE 01/29/20: ?IMPRESSIONS  ? ? ? 1. Left ventricular

## 2021-05-02 MED ORDER — SITAGLIPTIN PHOSPHATE 25 MG PO TABS: 25.0000 mg | ORAL_TABLET | Freq: Every day | ORAL | 1 refills | Status: DC

## 2021-05-10 ENCOUNTER — Ambulatory Visit: Payer: BC Managed Care – PPO | Admitting: Cardiology

## 2021-05-10 ENCOUNTER — Encounter: Payer: Self-pay | Admitting: Cardiology

## 2021-05-10 VITALS — BP 126/70 | HR 81 | Ht 64.0 in | Wt 176.0 lb

## 2021-05-10 DIAGNOSIS — I5032 Chronic diastolic (congestive) heart failure: Secondary | ICD-10-CM | POA: Diagnosis not present

## 2021-05-10 DIAGNOSIS — R002 Palpitations: Secondary | ICD-10-CM

## 2021-05-10 DIAGNOSIS — E785 Hyperlipidemia, unspecified: Secondary | ICD-10-CM

## 2021-05-10 DIAGNOSIS — I272 Pulmonary hypertension, unspecified: Secondary | ICD-10-CM | POA: Diagnosis not present

## 2021-05-10 DIAGNOSIS — Z8249 Family history of ischemic heart disease and other diseases of the circulatory system: Secondary | ICD-10-CM

## 2021-05-10 MED ORDER — SPIRONOLACTONE 25 MG PO TABS: 12.5000 mg | ORAL_TABLET | Freq: Every day | ORAL | 3 refills | Status: DC

## 2021-05-10 MED ORDER — DAPAGLIFLOZIN PROPANEDIOL 10 MG PO TABS: 10.0000 mg | ORAL_TABLET | Freq: Every day | ORAL | 3 refills | Status: DC

## 2021-05-10 NOTE — Patient Instructions (Signed)
Medication Instructions:  ?Your physician has recommended you make the following change in your medication:  ?START: spironolactone (Aldactone) 12.5 mg by mouth once daily --1/2 a pill ? ?START: dapagliflozin propanediol (Farxiga) 10 mg by mouth once daily ? We have provided you with 1 month of samples, a 30 day free trial paper and a $0 co pay card ? ?*If you need a refill on your cardiac medications before your next appointment, please call your pharmacy* ? ? ?Lab Work: ?IN 1 WEEK: BMP ?If you have labs (blood work) drawn today and your tests are completely normal, you will receive your results only by: ?MyChart Message (if you have MyChart) OR ?A paper copy in the mail ?If you have any lab test that is abnormal or we need to change your treatment, we will call you to review the results. ? ? ?Testing/Procedures: ?Your physician has requested that you have a Cardiac Calcium Score Test.  This is a self pay test at a cost of $99.  ? ? ?Follow-Up: ?At [facility], you and your health needs are our priority.  As part of our continuing mission to provide you with exceptional heart care, we have created designated Provider Care Teams.  These Care Teams include your primary Cardiologist (physician) and Advanced Practice Providers (APPs -  Physician Assistants and Nurse Practitioners) who all work together to provide you with the care you need, when you need it. ? ? ?Your next appointment:   ?6 month(s) ? ?The format for your next appointment:   ?In Person ? ?Provider:   ?Freada Bergeron, MD   ? ? ? ?

## 2021-05-17 ENCOUNTER — Other Ambulatory Visit: Payer: BC Managed Care – PPO | Admitting: *Deleted

## 2021-05-17 DIAGNOSIS — I5032 Chronic diastolic (congestive) heart failure: Secondary | ICD-10-CM

## 2021-05-17 LAB — BASIC METABOLIC PANEL
BUN/Creatinine Ratio: 28 (ref 12–28)
BUN: 21 mg/dL (ref 8–27)
CO2: 25 mmol/L (ref 20–29)
Calcium: 9.4 mg/dL (ref 8.7–10.3)
Chloride: 101 mmol/L (ref 96–106)
Creatinine, Ser: 0.76 mg/dL (ref 0.57–1.00)
Glucose: 121 mg/dL — ABNORMAL HIGH (ref 70–99)
Potassium: 4.2 mmol/L (ref 3.5–5.2)
Sodium: 141 mmol/L (ref 134–144)
eGFR: 89 mL/min/{1.73_m2} (ref >59)

## 2021-05-19 ENCOUNTER — Encounter: Payer: Self-pay | Admitting: Cardiology

## 2021-05-19 ENCOUNTER — Other Ambulatory Visit: Payer: Self-pay | Admitting: Cardiology

## 2021-05-19 MED ORDER — ONDANSETRON 4 MG PO TBDP: 4.0000 mg | ORAL_TABLET | Freq: Three times a day (TID) | ORAL | 3 refills | Status: AC | PRN

## 2021-05-22 ENCOUNTER — Other Ambulatory Visit: Payer: Self-pay | Admitting: Family Medicine

## 2021-05-22 DIAGNOSIS — E876 Hypokalemia: Secondary | ICD-10-CM

## 2021-05-23 ENCOUNTER — Other Ambulatory Visit: Payer: Self-pay | Admitting: *Deleted

## 2021-05-23 DIAGNOSIS — R002 Palpitations: Secondary | ICD-10-CM

## 2021-05-23 DIAGNOSIS — E785 Hyperlipidemia, unspecified: Secondary | ICD-10-CM

## 2021-05-23 DIAGNOSIS — I10 Essential (primary) hypertension: Secondary | ICD-10-CM

## 2021-05-23 DIAGNOSIS — I5032 Chronic diastolic (congestive) heart failure: Secondary | ICD-10-CM

## 2021-05-23 MED ORDER — FUROSEMIDE 20 MG PO TABS: ORAL_TABLET | ORAL | 3 refills | Status: DC

## 2021-05-26 ENCOUNTER — Encounter: Payer: Self-pay | Admitting: Pulmonary Disease

## 2021-05-26 ENCOUNTER — Ambulatory Visit: Payer: BC Managed Care – PPO | Admitting: Pulmonary Disease

## 2021-05-26 VITALS — BP 126/70 | HR 71 | Temp 98.2°F | Ht 64.0 in | Wt 172.8 lb

## 2021-05-26 DIAGNOSIS — J4550 Severe persistent asthma, uncomplicated: Secondary | ICD-10-CM

## 2021-05-26 DIAGNOSIS — J479 Bronchiectasis, uncomplicated: Secondary | ICD-10-CM

## 2021-05-26 NOTE — Progress Notes (Signed)
? ?      ?Karen Barnes    SJ:[id number]    Nov 14, 1960 ? ?Primary Care Physician:Kremer, Mortimer Fries, MD ? ?Referring Physician: Libby Maw, MD ?[address] ?[place],  [place] [id number] ? ?Chief complaint:   ?Follow-up for asthma, bronchiectasis ? ?HPI: ?61 year old with history of asthma, bronchiectasis immunotherapy with Dupixent started in 2019 ?Maintained on Symbicort, Spiriva. ?She has severe GERD and is followed by Dr. [name].  ?She was born prematurely at 75 weeks ?Works as a Pharmacist, hospital in a private school.  She is doing person classes at present. ? ?Developed COVID-19 in December 2020.  She was treated with monoclonal antibody as an outpatient and did not require hospitalization.  Overall she recovered well with chest x-ray last month showing clear lungs ? ?She is allergic to penicillins and doxycycline, macrolides have been ineffective in the past ? ?She gets regular allergy shots with Dr. Fredderick Phenix ? ?Interim history: ?Has a prolonged bronchitis, asthma exacerbation requiring 3 rounds of Levaquin and 4 rounds of prednisone.  Chest x-ray and sputum cultures at that time per negative ? ?She has finally turned the corner and is back to baseline now ?Continues on Dupixent, Symbicort and Spiriva ? ? ?Outpatient Encounter Medications as of 05/26/2021  ?Medication Sig  ? albuterol (VENTOLIN HFA) 108 (90 Base) MCG/ACT inhaler INHALE 2 PUFFS INTO THE LUNGS EVERY 6 HOURS AS NEEDED  ? aspirin EC 81 MG tablet Take 81 mg by mouth daily.  ? Calcium Carbonate-Vitamin D 600-200 MG-UNIT TABS Take 1 tablet by mouth daily.  ? chlorhexidine (PERIDEX) 0.12 % solution SMARTSIG:By Mouth  ? Continuous Blood Gluc Sensor (FREESTYLE LIBRE 2 SENSOR) MISC 1 each by Does not apply route every 14 (fourteen) days.  ? dapagliflozin propanediol (FARXIGA) 10 MG TABS tablet Take 1 tablet (10 mg total) by mouth daily before breakfast.  ? DUPIXENT 300 MG/2ML prefilled syringe INJECT 1 SYRINGE UNDER THE SKIN EVERY OTHER  WEEK  ? fluticasone (FLONASE) 50 MCG/ACT nasal spray SHAKE LIQUID AND USE 2 SPRAYS IN EACH NOSTRIL TWICE DAILY  ? furosemide (LASIX) 20 MG tablet Take 1 tablet (20MG ) by mouth daily as needed for fluid retention or swelling.  ? metFORMIN (GLUCOPHAGE) 1000 MG tablet Take 500 mg by mouth 2 (two) times daily with a meal.  ? montelukast (SINGULAIR) 10 MG tablet TAKE 1 TABLET BY MOUTH EVERY DAY  ? Multiple Vitamins-Minerals (MULTIVITAMIN WITH MINERALS) tablet Take 1 tablet by mouth daily.  ? ondansetron (ZOFRAN-ODT) 4 MG disintegrating tablet Take 1 tablet (4 mg total) by mouth every 8 (eight) hours as needed for nausea or vomiting.  ? pantoprazole (PROTONIX) 40 MG tablet TAKE 1 TABLET(40 MG) BY MOUTH DAILY  ? potassium chloride SA (KLOR-CON M) 20 MEQ tablet TAKE 1 TABLET( 20 MEQ TOTAL) BY MOUTH DAILY  ? Probiotic Product (ALIGN) 4 MG CAPS   ? rosuvastatin (CRESTOR) 10 MG tablet Take 1 tablet (10 mg total) by mouth daily.  ? sodium chloride HYPERTONIC 3 % nebulizer solution Take by nebulization twice daily as needed  ? spironolactone (ALDACTONE) 25 MG tablet Take 0.5 tablets (12.5 mg total) by mouth daily.  ? SYMBICORT 160-4.5 MCG/ACT inhaler INHALE 2 PUFFS BY MOUTH TWICE DAILY  ? Tiotropium Bromide Monohydrate (SPIRIVA RESPIMAT) 1.25 MCG/ACT AERS INHALE 2 PUFFS BY MOUTH EVERY DAY  ? [DISCONTINUED] levofloxacin (LEVAQUIN) 500 MG tablet Take 1 tablet (500 mg total) by mouth daily. (Patient not taking: Reported on 05/10/2021)  ? [DISCONTINUED] metFORMIN (GLUCOPHAGE-XR) 500 MG 24 hr  tablet Take 1 tablet (500 mg total) by mouth 2 (two) times daily at 10 AM and 5 PM. (Patient not taking: Reported on 05/10/2021)  ? [DISCONTINUED] predniSONE (DELTASONE) 10 MG tablet Take 4 tabs by mouth once daily x4 days, then 3 tabs x4 days, 2 tabs x4 days, 1 tab x4 days and stop. (Patient not taking: Reported on 05/10/2021)  ? [DISCONTINUED] sitaGLIPtin (JANUVIA) 25 MG tablet Take 1 tablet (25 mg total) by mouth daily. (Patient not taking:  Reported on 05/10/2021)  ? ?No facility-administered encounter medications on file as of 05/26/2021.  ? ?Physical Exam: ?Blood pressure 114/62, pulse 63, temperature 98.1 ?F (36.7 ?C), temperature source Oral, height 5\' 4"  (1.626 m), weight 172 lb 6.4 oz (78.2 kg), SpO2 100 %. ?Gen:      No acute distress ?HEENT:  EOMI, sclera anicteric ?Neck:     No masses; no thyromegaly ?Lungs:    Clear to auscultation bilaterally; normal respiratory effort ?CV:         Regular rate and rhythm; no murmurs ?Abd:      + bowel sounds; soft, non-tender; no palpable masses, no distension ?Ext:    No edema; adequate peripheral perfusion ?Skin:      Warm and dry; no rash ?Neuro: alert and oriented x 3 ?Psych: normal mood and affect  ? ?Data Reviewed: ?Imaging: ?November 07, 2017 high-resolution CT scan of the chest images : Mild cylindrical bronchiectasis particularly in the left lower lobe, some in the right lower lobe. ?  ?10/18/2015 chest CT images  showing normal pulmonary parenchyma with the exception of a 3.4 mm nodule in the left lower lobe. ? ?Chest x-ray 08/30/2020-no active cardiopulmonary disease ?I have reviewed the images personally. ? ?  ?PFTs: ?October 2019 ratio 90%, FVC 2.56 L 76% predicted, total lung capacity 4.32 L 84% predicted, DLCO 20.7 mL 84% predicted ?  ?Labs: ?October 2019 complement levels within normal limits, IgG subclasses all normal, alpha-1 antitrypsin level normal, CBC showed 300 absolute eosinophil count ?  ?11/03/2015 - Ige - 32 ? ?Sputum culture 08/31/2020-normal oropharyngeal flora ?  ?Heart catheterization: ?October 2019 right heart catheterization showed mean PA pressure 24, pulmonary capillary wedge pressure 19, PVR 0.7 Woods units ? ?Assessment:  ?Bronchiectasis ?Continue chest vest, Mucinex.  Hypertonic saline for mucociliary clearance ?Sputum cultures have been negative for AFB ?  ?Severe persistent asthma ?Continue Symbicort, Spiriva, Dupixent ?She can use over-the-counter Zyrtec for postnasal  drip ?Follows with Dr. Fredderick Phenix for allergy shots ? ?Plan/Recommendations: ?Continue current asthma regimen ?Mucociliary clearance with percussion, Mucinex and hypertonic saline ? ?Marshell Garfinkel MD ?[facility] ?05/26/2021, 10:08 AM ? ?CC: Libby Maw,* ? ?

## 2021-05-26 NOTE — Patient Instructions (Signed)
I am glad you are doing better with your breathing ?Continue inhalers and Dupixent ?Follow-up in 6 months with video visit. ?

## 2021-05-26 NOTE — Progress Notes (Signed)
? ?      ?Karen Barnes    [id number]    August 26, 1960 ? ?Primary Care Physician:Kremer, Talmadge Coventry, MD ? ?Referring Physician: Mliss Sax, MD ?[address] ?[place],  [place] [address] ? ?Chief complaint:   ?Follow-up for asthma, bronchiectasis ? ?HPI: ?61 year old with history of asthma, bronchiectasis immunotherapy with Dupixent started in 2019 ?Maintained on Symbicort, Spiriva. ?She has severe GERD and is followed by Dr. Ewing Schlein.  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  She is doing person classes at present. ? ?Developed COVID-19 in December 2020.  She was treated with monoclonal antibody as an outpatient and did not require hospitalization.  Overall she recovered well with chest x-ray last month showing clear lungs ? ?She is allergic to penicillins and doxycycline, macrolides have been ineffective in the past ? ?She gets regular allergy shots with Dr. Madie Reno ? ?Interim history: ?Has a prolonged bronchitis, asthma exacerbation requiring 3 rounds of Levaquin and 4 rounds of prednisone around end of 2022.  Chest x-ray and sputum cultures at that time per negative ? ?She had another flareup last month brought on by exposure to viral infection from her granddaughter.  Treated with Levaquin and prednisone and is back to baseline ?Continues on Dupixent, Symbicort and Spiriva ? ? ?Outpatient Encounter Medications as of 05/26/2021  ?Medication Sig  ? albuterol (VENTOLIN HFA) 108 (90 Base) MCG/ACT inhaler INHALE 2 PUFFS INTO THE LUNGS EVERY 6 HOURS AS NEEDED  ? aspirin EC 81 MG tablet Take 81 mg by mouth daily.  ? Calcium Carbonate-Vitamin D 600-200 MG-UNIT TABS Take 1 tablet by mouth daily.  ? chlorhexidine (PERIDEX) 0.12 % solution SMARTSIG:By Mouth  ? Continuous Blood Gluc Sensor (FREESTYLE LIBRE 2 SENSOR) MISC 1 each by Does not apply route every 14 (fourteen) days.  ? dapagliflozin propanediol (FARXIGA) 10 MG TABS tablet Take 1 tablet (10 mg total)  by mouth daily before breakfast.  ? DUPIXENT 300 MG/2ML prefilled syringe INJECT 1 SYRINGE UNDER THE SKIN EVERY OTHER WEEK  ? fluticasone (FLONASE) 50 MCG/ACT nasal spray SHAKE LIQUID AND USE 2 SPRAYS IN EACH NOSTRIL TWICE DAILY  ? furosemide (LASIX) 20 MG tablet Take 1 tablet (20MG ) by mouth daily as needed for fluid retention or swelling.  ? metFORMIN (GLUCOPHAGE) 1000 MG tablet Take 500 mg by mouth 2 (two) times daily with a meal.  ? montelukast (SINGULAIR) 10 MG tablet TAKE 1 TABLET BY MOUTH EVERY DAY  ? Multiple Vitamins-Minerals (MULTIVITAMIN WITH MINERALS) tablet Take 1 tablet by mouth daily.  ? ondansetron (ZOFRAN-ODT) 4 MG disintegrating tablet Take 1 tablet (4 mg total) by mouth every 8 (eight) hours as needed for nausea or vomiting.  ? pantoprazole (PROTONIX) 40 MG tablet TAKE 1 TABLET(40 MG) BY MOUTH DAILY  ? potassium chloride SA (KLOR-CON M) 20 MEQ tablet TAKE 1 TABLET( 20 MEQ TOTAL) BY MOUTH DAILY  ? Probiotic Product (ALIGN) 4 MG CAPS   ? rosuvastatin (CRESTOR) 10 MG tablet Take 1 tablet (10 mg total) by mouth daily.  ? sodium chloride HYPERTONIC 3 % nebulizer solution Take by nebulization twice daily as needed  ? spironolactone (ALDACTONE) 25 MG tablet Take 0.5 tablets (12.5 mg total) by mouth daily.  ? SYMBICORT 160-4.5 MCG/ACT inhaler INHALE 2 PUFFS BY MOUTH TWICE DAILY  ? Tiotropium Bromide Monohydrate (SPIRIVA RESPIMAT) 1.25 MCG/ACT AERS INHALE 2 PUFFS BY MOUTH EVERY DAY  ? [DISCONTINUED] levofloxacin (LEVAQUIN) 500 MG tablet Take 1 tablet (500 mg  total) by mouth daily. (Patient not taking: Reported on 05/10/2021)  ? [DISCONTINUED] metFORMIN (GLUCOPHAGE-XR) 500 MG 24 hr tablet Take 1 tablet (500 mg total) by mouth 2 (two) times daily at 10 AM and 5 PM. (Patient not taking: Reported on 05/10/2021)  ? [DISCONTINUED] predniSONE (DELTASONE) 10 MG tablet Take 4 tabs by mouth once daily x4 days, then 3 tabs x4 days, 2 tabs x4 days, 1 tab x4 days and stop. (Patient not taking: Reported on 05/10/2021)  ?  [DISCONTINUED] sitaGLIPtin (JANUVIA) 25 MG tablet Take 1 tablet (25 mg total) by mouth daily. (Patient not taking: Reported on 05/10/2021)  ? ?No facility-administered encounter medications on file as of 05/26/2021.  ? ?Physical Exam: ?Blood pressure 126/70, pulse 71, temperature 98.2 ?F (36.8 ?C), temperature source Oral, height 5\' 4"  (1.626 m), weight 172 lb 12.8 oz (78.4 kg), SpO2 98 %. ?Gen:      No acute distress ?HEENT:  EOMI, sclera anicteric ?Neck:     No masses; no thyromegaly ?Lungs:    Clear to auscultation bilaterally; normal respiratory effort ?CV:         Regular rate and rhythm; no murmurs ?Abd:      + bowel sounds; soft, non-tender; no palpable masses, no distension ?Ext:    No edema; adequate peripheral perfusion ?Skin:      Warm and dry; no rash ?Neuro: alert and oriented x 3 ?Psych: normal mood and affect  ? ?Data Reviewed: ?Imaging: ?November 07, 2017 high-resolution CT scan of the chest images : Mild cylindrical bronchiectasis particularly in the left lower lobe, some in the right lower lobe. ?  ?10/18/2015 chest CT images  showing normal pulmonary parenchyma with the exception of a 3.4 mm nodule in the left lower lobe. ? ?Chest x-ray 08/30/2020-no active cardiopulmonary disease ?I have reviewed the images personally. ?  ?PFTs: ?October 2019 ratio 90%, FVC 2.56 L 76% predicted, total lung capacity 4.32 L 84% predicted, DLCO 20.7 mL 84% predicted ?  ?Labs: ?October 2019 complement levels within normal limits, IgG subclasses all normal, alpha-1 antitrypsin level normal, CBC showed 300 absolute eosinophil count ?  ?11/03/2015 - Ige - 32 ? ?Sputum culture 08/31/2020-normal oropharyngeal flora ?  ?Heart catheterization: ?October 2019 right heart catheterization showed mean PA pressure 24, pulmonary capillary wedge pressure 19, PVR 0.7 Woods units ? ?Assessment:  ?Bronchiectasis ?Continue chest vest, Mucinex.  Hypertonic saline for mucociliary clearance ?Sputum cultures have been negative for AFB ?  ?Severe  persistent asthma ?Continue Symbicort, Spiriva, Dupixent ?She can use over-the-counter Zyrtec for postnasal drip ?Follows with Dr. November 2019 for allergy shots ? ?Plan/Recommendations: ?Continue current asthma regimen ?Mucociliary clearance with percussion, Mucinex and hypertonic saline ? ?Madie Reno MD ?[facility] ?05/26/2021, 10:07 AM ? ?CC: 05/28/2021,* ? ?

## 2021-05-27 ENCOUNTER — Encounter: Payer: Self-pay | Admitting: Cardiology

## 2021-06-06 ENCOUNTER — Other Ambulatory Visit: Payer: Self-pay | Admitting: Primary Care

## 2021-06-06 DIAGNOSIS — J4550 Severe persistent asthma, uncomplicated: Secondary | ICD-10-CM

## 2021-06-07 NOTE — Telephone Encounter (Signed)
Refill sent for DUPIXENT to [facility]: [phone] ? ?Dose: 300 mg every 2 weeks ? ?Last OV: 05/26/21 ?Provider: Dr. Isaiah Serge ? ?Next OV: not yet scheduled (6 months) ? ?Chesley Mires, PharmD, MPH, BCPS ?Clinical Pharmacist (Rheumatology and Pulmonology)  ?

## 2021-06-20 ENCOUNTER — Ambulatory Visit
Admission: RE | Admit: 2021-06-20 | Discharge: 2021-06-20 | Disposition: A | Discharge status: Home / Self Care | Payer: Self-pay | Source: Ambulatory Visit | Attending: Cardiology | Admitting: Cardiology

## 2021-06-20 DIAGNOSIS — I5032 Chronic diastolic (congestive) heart failure: Secondary | ICD-10-CM

## 2021-06-23 ENCOUNTER — Other Ambulatory Visit (HOSPITAL_COMMUNITY): Payer: Self-pay

## 2021-06-29 ENCOUNTER — Other Ambulatory Visit: Payer: Self-pay | Admitting: Family Medicine

## 2021-06-29 DIAGNOSIS — E876 Hypokalemia: Secondary | ICD-10-CM

## 2021-07-01 ENCOUNTER — Other Ambulatory Visit: Payer: Self-pay | Admitting: Pulmonary Disease

## 2021-07-01 ENCOUNTER — Other Ambulatory Visit (HOSPITAL_COMMUNITY): Payer: Self-pay

## 2021-07-06 ENCOUNTER — Encounter: Payer: Self-pay | Admitting: Family Medicine

## 2021-07-06 ENCOUNTER — Ambulatory Visit: Payer: BC Managed Care – PPO | Admitting: Family Medicine

## 2021-07-06 VITALS — BP 118/66 | HR 69 | Temp 97.4°F | Ht 64.0 in | Wt 177.4 lb

## 2021-07-06 DIAGNOSIS — E118 Type 2 diabetes mellitus with unspecified complications: Secondary | ICD-10-CM

## 2021-07-06 LAB — BASIC METABOLIC PANEL
BUN: 18 mg/dL (ref 6–23)
CO2: 29 mEq/L (ref 19–32)
Calcium: 9.6 mg/dL (ref 8.4–10.5)
Chloride: 100 mEq/L (ref 96–112)
Creatinine, Ser: 0.73 mg/dL (ref 0.40–1.20)
GFR: 87.93 mL/min (ref >60.00)
Glucose, Bld: 140 mg/dL — ABNORMAL HIGH (ref 70–99)
Potassium: 4.1 mEq/L (ref 3.5–5.1)
Sodium: 138 mEq/L (ref 135–145)

## 2021-07-06 LAB — HEMOGLOBIN A1C: Hgb A1c MFr Bld: 7.1 % — ABNORMAL HIGH (ref 4.6–6.5)

## 2021-07-06 MED ORDER — FREESTYLE LIBRE 2 SENSOR MISC: 1.0000 | 6 refills | Status: AC

## 2021-07-06 NOTE — Progress Notes (Signed)
Established Patient Office Visit  Subjective   Patient ID: Karen Barnes, female    DOB: August 06, 1960  Age: 61 y.o. MRN: [id number]  Chief Complaint  Patient presents with   Follow-up    3 month f/u.   C/o having elevated blood sugar.  (Average 165- 171)    HPI follow-up of diabetes.  Through her continuous glucose monitoring sugars have been up and down.  She has restarted metformin 1000 mg twice daily.  She has been prescribed Januvia but then decided to stop it because her sugars dropped.  Sugars have been running in the 160s to 170s.  She is interested in where her hemoglobin A1c is now.  In the last 3 months she did have 16 days of prednisone for control of her asthma.  She is moving to [facility] in August.    Review of Systems  Constitutional:  Negative for chills, diaphoresis, malaise/fatigue and weight loss.  HENT: Negative.    Eyes: Negative.  Negative for blurred vision and double vision.  Cardiovascular:  Negative for chest pain.  Gastrointestinal:  Negative for abdominal pain.  Genitourinary: Negative.   Musculoskeletal:  Negative for falls and myalgias.  Neurological:  Negative for speech change, loss of consciousness and weakness.  Psychiatric/Behavioral: Negative.       Objective:     BP 118/66   Pulse 69   Temp (!) 97.4 F (36.3 C) (Temporal)   Ht 5\' 4"  (1.626 m)   Wt 177 lb 6.4 oz (80.5 kg)   SpO2 98%   BMI 30.45 kg/m    Physical Exam Constitutional:      General: She is not in acute distress.    Appearance: Normal appearance. She is not ill-appearing, toxic-appearing or diaphoretic.  HENT:     Head: Normocephalic and atraumatic.     Right Ear: External ear normal.     Left Ear: External ear normal.     Mouth/Throat:     Mouth: Mucous membranes are moist.     Pharynx: Oropharynx is clear. No oropharyngeal exudate or posterior oropharyngeal erythema.  Eyes:     General: No scleral icterus.       Right eye: No discharge.        Left eye: No  discharge.     Extraocular Movements: Extraocular movements intact.     Conjunctiva/sclera: Conjunctivae normal.     Pupils: Pupils are equal, round, and reactive to light.  Cardiovascular:     Rate and Rhythm: Normal rate and regular rhythm.  Pulmonary:     Effort: Pulmonary effort is normal. No respiratory distress.     Breath sounds: Normal breath sounds.  Abdominal:     General: Bowel sounds are normal.  Musculoskeletal:     Cervical back: No rigidity or tenderness.  Skin:    General: Skin is warm and dry.  Neurological:     Mental Status: She is alert and oriented to person, place, and time.  Psychiatric:        Mood and Affect: Mood normal.        Behavior: Behavior normal.     No results found for any visits on 07/06/21.    The 10-year ASCVD risk score (Arnett DK, et al., 2019) is: 6.9%    Assessment & Plan:   Problem List Items Addressed This Visit       Endocrine   Type 2 diabetes mellitus with complication (HCC) - Primary   Relevant Medications   JANUVIA 25 MG  tablet   Continuous Blood Gluc Sensor (FREESTYLE LIBRE 2 SENSOR) MISC   Other Relevant Orders   Basic metabolic panel   Hemoglobin A1c    Return in about 3 months (around 10/06/2021).   Pending results of hemoglobin A1c could recommend that she restart Januvia or even increase the dosage.  She is planning on scheduling follow-up near her new residence. Mliss Sax, MD

## 2021-07-07 ENCOUNTER — Other Ambulatory Visit (HOSPITAL_COMMUNITY): Payer: Self-pay

## 2021-07-12 ENCOUNTER — Telehealth: Payer: Self-pay | Admitting: Family Medicine

## 2021-07-12 ENCOUNTER — Other Ambulatory Visit: Payer: Self-pay | Admitting: Family Medicine

## 2021-07-26 ENCOUNTER — Other Ambulatory Visit: Payer: Self-pay | Admitting: Family Medicine

## 2021-07-26 DIAGNOSIS — E876 Hypokalemia: Secondary | ICD-10-CM

## 2021-08-03 ENCOUNTER — Other Ambulatory Visit: Payer: Self-pay | Admitting: Primary Care

## 2021-08-05 ENCOUNTER — Encounter: Payer: Self-pay | Admitting: Pulmonary Disease

## 2021-08-05 MED ORDER — AEROCHAMBER MV MISC: 0 refills | Status: AC

## 2021-08-19 NOTE — Telephone Encounter (Signed)
error 

## 2021-09-01 ENCOUNTER — Other Ambulatory Visit: Payer: Self-pay | Admitting: Pulmonary Disease

## 2021-09-05 ENCOUNTER — Other Ambulatory Visit: Payer: Self-pay

## 2021-09-05 MED ORDER — JANUVIA 25 MG PO TABS: 25.0000 mg | ORAL_TABLET | Freq: Every day | ORAL | 1 refills | Status: AC

## 2021-09-05 NOTE — Telephone Encounter (Signed)
Received a refill request for: Januvia 25 mg tabs LR  hx provider LOV  07/06/21 FOV   none scheduled.     Please review and advise.   Thanks. Dm/cma

## 2021-09-06 ENCOUNTER — Other Ambulatory Visit (HOSPITAL_COMMUNITY): Payer: Self-pay

## 2021-09-19 ENCOUNTER — Other Ambulatory Visit: Payer: Self-pay

## 2021-09-19 DIAGNOSIS — I5032 Chronic diastolic (congestive) heart failure: Secondary | ICD-10-CM

## 2021-09-19 DIAGNOSIS — I10 Essential (primary) hypertension: Secondary | ICD-10-CM

## 2021-09-19 DIAGNOSIS — E785 Hyperlipidemia, unspecified: Secondary | ICD-10-CM

## 2021-09-19 DIAGNOSIS — R002 Palpitations: Secondary | ICD-10-CM

## 2021-09-19 MED ORDER — FUROSEMIDE 20 MG PO TABS: ORAL_TABLET | ORAL | 8 refills | Status: DC

## 2021-09-20 MED ORDER — FUROSEMIDE 20 MG PO TABS: ORAL_TABLET | ORAL | 0 refills | Status: DC

## 2021-09-20 NOTE — Addendum Note (Signed)
Addended by: Margaret Pyle D on: 09/20/2021 12:38 PM   Modules accepted: Orders

## 2021-09-21 ENCOUNTER — Other Ambulatory Visit: Payer: Self-pay

## 2021-09-21 ENCOUNTER — Encounter: Payer: Self-pay | Admitting: Pulmonary Disease

## 2021-09-21 MED ORDER — OPTICHAMBER DIAMOND-MD MASK MISC: 1.0000 | 0 refills | Status: AC | PRN

## 2021-09-21 NOTE — Telephone Encounter (Signed)
Okay to submit prescription for OptiChamber to her pharmacy as requested.

## 2021-09-29 ENCOUNTER — Ambulatory Visit: Payer: BC Managed Care – PPO | Admitting: Family Medicine

## 2021-10-06 ENCOUNTER — Other Ambulatory Visit: Payer: Self-pay | Admitting: Pulmonary Disease

## 2021-11-08 NOTE — Progress Notes (Unsigned)
Name: Karen Barnes  MRN/ DOB: [id number], 10-13-1960    Age/ Sex: 61 y.o., female     PCP: Mliss Sax, MD   Reason for Endocrinology Evaluation: Thyroid nodule      Initial Endocrinology Clinic Visit: 12/28/2017    PATIENT IDENTIFIER: Karen Barnes is a 61 y.o., female with a past medical history of T2DM, Asthma, GERD,Thyroid nodule and bronchiectasis. She has followed with [facility] since 12/28/2017 for consultative assistance with management of her thyroid nodule.   HISTORICAL SUMMARY: The patient was first diagnosed with right thyroid nodule in 11/2017 during CT scan of the chest at was performed for evaluation of cough.  S/P FNA with benign cytology in October, 2019  Per patient she has had intermittently low TSH.   SUBJECTIVE:   Today (11/08/2021):  [name] is here for a follow up on right thyroid nodule.    Has chronic cough due to bronchiectasis.  Uses vest therapy  Has been noted with weight loss - on weight waters  Denies local neck swelling  Continues with occasional palpitations Diarrhea resolved  Denies tremors      HISTORY:  Past Medical History:  Past Medical History:  Diagnosis Date   Asthma, moderate persistent    sees Dr Irena Cords   Atrial tachycardia [facility])    Chronic diastolic CHF (congestive heart failure) (HCC)    COVID-19 virus infection 01/2019   Diabetes mellitus    A1C 6.2---->  2009   Family history of adverse reaction to anesthesia    sister had cardiac arrest with rhinoplasty   Fibrocystic breast    GERD (gastroesophageal reflux disease)    HH (hiatus hernia) 09/2014   Dr. Ewing Schlein   History of echocardiogram    Echo 5/19: EF 55-60, normal wall motion, PASP 41   History of hiatal hernia    History of nuclear stress test    Nuclear stress test 5/19:  EF 64, normal perfusion, Low risk   Hot flashes    Hyperlipidemia    Premature atrial contractions    Pulmonary hypertension (HCC)    PVC's  (premature ventricular contractions)    Recurrent cystitis    took  postcoital antibiotic for years, symptoms better after her hysterectomy 1996, symptoms re-surface in 2011   RUQ abdominal pain 2016   EGD showed small HH, otherwise normal   Wears glasses    Past Surgical History:  Past Surgical History:  Procedure Laterality Date   ABDOMINAL HYSTERECTOMY  1996   no oophorectomy, d/tendometriosis and fibroids approximately 1996   BREAST EXCISIONAL BIOPSY Right 03/12/2014   [facility]   BREAST LUMPECTOMY WITH RADIOACTIVE SEED LOCALIZATION Right 03/12/2014   Procedure: BREAST LUMPECTOMY WITH RADIOACTIVE SEED LOCALIZATION;  Surgeon: Harriette Bouillon, MD;  Location: [facility];  Service: General;  Laterality: Right;   BREAST SURGERY     Lumpectomy-- BENIGN    CATARACT EXTRACTION Bilateral 2011   CESAREAN SECTION     COSMETIC SURGERY  01/2017   Mona[place]isa Touch w/ GYN   DILATION AND CURETTAGE OF UTERUS     LIPOMA EXCISION  1978   RIGHT HEART CATH N/A 11/26/2017   Procedure: RIGHT HEART CATH;  Surgeon: Laurey Morale, MD;  Location: [facility];  Service: Cardiovascular;  Laterality: N/A;   UPPER GASTROINTESTINAL ENDOSCOPY     UPPER GI ENDOSCOPY  09/11/2014   Small Hiatus Hernia, Dr. Ewing Schlein   Social History:  reports that she has never smoked. She  has never used smokeless tobacco. She reports that she does not drink alcohol and does not use drugs. Family History:  Family History  Problem Relation Age of Onset   Mitral valve prolapse Mother    Breast cancer Mother 26       M had ca insitu (age 64), aunt    Dementia Mother    Heart disease Father 28       early    Diabetes Father    Kidney failure Father    Sleep apnea Father    Brain cancer Other        2 fam members    Lung cancer Other        uncle, heavy smoker   Breast cancer Maternal Aunt 95   CAD Paternal Grandmother 81   CAD Paternal Grandfather 2   Colon cancer Neg Hx      HOME MEDICATIONS: Allergies  as of 11/09/2021       Reactions   Butorphanol Tartrate Other (See Comments)   REACTION: hallucinations   Hydrocodone Other (See Comments)   HALLUCINATIONS   Roxicet [oxycodone-acetaminophen] Other (See Comments)   HALLUCINATIONS   Butorphanol Other (See Comments)   Doxycycline    DOES NOT WORK FOR PATIENT   Penicillins Hives   Occurred at age 65/CHILDHOOD ALLERGY Has patient had a PCN reaction causing immediate rash, facial/tongue/throat swelling, SOB or lightheadedness with hypotension: Unknown Has patient had a PCN reaction causing severe rash involving mucus membranes or skin necrosis: Unknown Has patient had a PCN reaction that required hospitalization: Unknown Has patient had a PCN reaction occurring within the last 10 years: No If all of the above answers are "NO", then may proceed with Cephalosporin use.        Medication List        Accurate as of November 08, 2021 10:49 AM. If you have any questions, ask your nurse or doctor.          AeroChamber MV inhaler Use as instructed   OptiChamber Diamond-Md Mask Misc 1 each by Does not apply route as needed.   albuterol 108 (90 Base) MCG/ACT inhaler Commonly known as: VENTOLIN HFA INHALE 2 PUFFS INTO THE LUNGS EVERY 6 HOURS AS NEEDED   Align 4 MG Caps   aspirin EC 81 MG tablet Take 81 mg by mouth daily.   Calcium Carbonate-Vitamin D 600-200 MG-UNIT Tabs Take 1 tablet by mouth daily.   chlorhexidine 0.12 % solution Commonly known as: PERIDEX SMARTSIG:By Mouth   Dupixent 300 MG/2ML prefilled syringe Generic drug: dupilumab INJECT 1 SYRINGE UNDER THE SKIN EVERY OTHER WEEK   fluticasone 50 MCG/ACT nasal spray Commonly known as: FLONASE SHAKE LIQUID AND USE 2 SPRAYS IN EACH NOSTRIL TWICE DAILY   FreeStyle Libre 2 Sensor Misc 1 each by Does not apply route every 14 (fourteen) days.   furosemide 20 MG tablet Commonly known as: LASIX Take 1 tablet (20MG ) by mouth daily as needed for fluid retention or  swelling.   Januvia 25 MG tablet Generic drug: sitaGLIPtin Take 1 tablet (25 mg total) by mouth daily.   metFORMIN 1000 MG tablet Commonly known as: GLUCOPHAGE TAKE 1 TABLET(1000 MG) BY MOUTH TWICE DAILY   montelukast 10 MG tablet Commonly known as: SINGULAIR TAKE 1 TABLET BY MOUTH EVERY DAY   multivitamin with minerals tablet Take 1 tablet by mouth daily.   ondansetron 4 MG disintegrating tablet Commonly known as: ZOFRAN-ODT Take 1 tablet (4 mg total) by mouth every 8 (eight) hours  as needed for nausea or vomiting.   pantoprazole 40 MG tablet Commonly known as: PROTONIX TAKE 1 TABLET(40 MG) BY MOUTH DAILY   potassium chloride SA 20 MEQ tablet Commonly known as: KLOR-CON M TAKE 1 TABLET(20 MEQ) BY MOUTH DAILY   rosuvastatin 10 MG tablet Commonly known as: Crestor Take 1 tablet (10 mg total) by mouth daily.   sodium chloride HYPERTONIC 3 % nebulizer solution Take by nebulization twice daily as needed   Spiriva Respimat 1.25 MCG/ACT Aers Generic drug: Tiotropium Bromide Monohydrate INHALE 2 PUFFS BY MOUTH EVERY DAY   spironolactone 25 MG tablet Commonly known as: Aldactone Take 0.5 tablets (12.5 mg total) by mouth daily.   Symbicort 160-4.5 MCG/ACT inhaler Generic drug: budesonide-formoterol INHALE 2 PUFFS BY MOUTH TWICE DAILY          OBJECTIVE:   PHYSICAL EXAM: VS: There were no vitals taken for this visit.   EXAM: General: Pt appears well and is in NAD  Neck: General: Supple without adenopathy. Thyroid:  No goiter or nodules appreciated.   Lungs: Clear with good BS bilat with no rales, rhonchi, or wheezes  Heart: Auscultation: RRR.  Abdomen: Normoactive bowel sounds, soft, nontender, without masses or organomegaly palpable  Extremities:  BL LE: No pretibial edema normal ROM and strength.  Mental Status: Judgment, insight: Intact Orientation: Oriented to time, place, and person Mood and affect: No depression, anxiety, or agitation     DATA  REVIEWED:  Results for RYANN, PAULI (MRN [id number]) as of 11/08/2020 13:14  Ref. Range 11/08/2020 10:27  TSH Latest Ref Range: 0.35 - 5.50 uIU/mL 1.46  T4,Free(Direct) Latest Ref Range: 0.60 - 1.60 ng/dL 6.78    Thyroid ultrasound 11/29/2020  Estimated total number of nodules >/= 1 cm: 1   Number of spongiform nodules >/=  2 cm not described below (TR1): 0   Number of mixed cystic and solid nodules >/= 1.5 cm not described below (TR2): 0   _________________________________________________________   The previously biopsied nodule in the right mid thyroid gland remains unchanged at 1.4 x 1.0 x 0.8 cm.   No new nodules or suspicious features.   IMPRESSION: Continued stability of previously biopsied right mid thyroid nodule.   No new nodules or suspicious features.    FNA 12/04/2017  THYROID, FINE NEEDLE ASPIRATION RIGHT LOBE, RIGHT MID POLE (SPECIMEN 1 OF 1 COLLECTED 12/04/17) CONSISTENT WITH BENIGN FOLLICULAR NODULE (BETHESDA CATEGORY II).  ASSESSMENT / PLAN / RECOMMENDATIONS:   Multinodular Goiter:    - She is clinically euthyroid.  - TFT's normal  - S/P FNA of the right thyroid nodule in October,2019 with benign cytology.  - Repeat ultrasound in 08/2019 assures stability , will repeat again this year       F/u in 1 yr   Signed electronically by: Lyndle Herrlich, MD  [facility]  [facility] [address] [place] [place], [place] [address] Phone: [phone] FAX: [phone]      CC: Mliss Sax, MD [address] [place] [address] Phone: [phone]  Fax: [phone]   Return to Endocrinology clinic as below: Future Appointments  Date Time Provider Department Center  11/09/2021  7:30 AM Meaghan Whistler, Konrad Dolores, MD [facility] None

## 2021-11-09 ENCOUNTER — Telehealth: Payer: Self-pay

## 2021-11-09 ENCOUNTER — Ambulatory Visit: Payer: BC Managed Care – PPO | Admitting: Internal Medicine

## 2021-11-09 ENCOUNTER — Encounter: Payer: Self-pay | Admitting: Internal Medicine

## 2021-11-09 VITALS — BP 120/70 | HR 66 | Ht 64.0 in | Wt 182.2 lb

## 2021-11-09 DIAGNOSIS — E042 Nontoxic multinodular goiter: Secondary | ICD-10-CM | POA: Diagnosis not present

## 2021-11-09 DIAGNOSIS — J4550 Severe persistent asthma, uncomplicated: Secondary | ICD-10-CM

## 2021-11-09 LAB — TSH: TSH: 1.64 u[IU]/mL (ref 0.35–5.50)

## 2021-11-09 LAB — T4, FREE: Free T4: 1.13 ng/dL (ref 0.60–1.60)

## 2021-11-09 NOTE — Telephone Encounter (Signed)
Received faxed PA renewal form from [facility] for [place]. Completed form and faxed in, will await determination.

## 2021-11-15 ENCOUNTER — Telehealth: Payer: Self-pay | Admitting: Pharmacist

## 2021-11-15 DIAGNOSIS — J4550 Severe persistent asthma, uncomplicated: Secondary | ICD-10-CM

## 2021-11-15 MED ORDER — DUPIXENT 300 MG/2ML ~~LOC~~ SOSY: PREFILLED_SYRINGE | SUBCUTANEOUS | 0 refills | Status: DC

## 2021-11-15 NOTE — Telephone Encounter (Signed)
Received notification from [facility] regarding a prior authorization for [place]. Authorization has been APPROVED from 11/14/21 to 11/15/22.   Patient must continue to fill through [facility]: [phone]  Authorization # [id number]  Also received refill request. Patient is due for appt and has no appt scheduled. Will route to scheduling team for assistance  Knox Saliva, PharmD, MPH, BCPS, CPP Clinical Pharmacist (Rheumatology and Pulmonology)

## 2021-11-15 NOTE — Telephone Encounter (Signed)
Received refill request for patient's Dupixent. Patient last seen by Dr. [name] in April 2023 w requested 6 month f/u. No appt scheduled. Routing to scheduling team  Knox Saliva, PharmD, MPH, BCPS, CPP Clinical Pharmacist (Rheumatology and Pulmonology)

## 2021-11-21 ENCOUNTER — Encounter: Payer: Self-pay | Admitting: Internal Medicine

## 2021-11-22 ENCOUNTER — Other Ambulatory Visit (HOSPITAL_COMMUNITY): Payer: Self-pay

## 2021-11-23 ENCOUNTER — Other Ambulatory Visit (HOSPITAL_COMMUNITY): Payer: Self-pay

## 2021-12-13 ENCOUNTER — Encounter: Payer: Self-pay | Admitting: Pulmonary Disease

## 2021-12-13 ENCOUNTER — Telehealth (INDEPENDENT_AMBULATORY_CARE_PROVIDER_SITE_OTHER): Payer: BC Managed Care – PPO | Admitting: Pulmonary Disease

## 2021-12-13 DIAGNOSIS — J4550 Severe persistent asthma, uncomplicated: Secondary | ICD-10-CM | POA: Diagnosis not present

## 2021-12-13 DIAGNOSIS — J479 Bronchiectasis, uncomplicated: Secondary | ICD-10-CM

## 2021-12-13 NOTE — Progress Notes (Signed)
Karen Barnes    [id number]    October 21, 1960  Primary Care Physician:Craig, Burnadette Pop, MD  Referring Physician: Chester Holstein, MD [name],  [name]  Virtual Visit via Video Note  I connected with Karen Barnes on 12/13/21 at  9:15 AM EST by a video enabled telemedicine application and verified that I am speaking with the correct person using two identifiers.  Location: Patient: Home Provider: [address]   I discussed the limitations of evaluation and management by telemedicine and the availability of in person appointments. The patient expressed understanding and agreed to proceed.  Chief complaint:   Follow-up for asthma, bronchiectasis  HPI: 60 y.o.  with history of asthma, bronchiectasis immunotherapy with Dupixent started in 2019 Maintained on Symbicort, Spiriva. She has severe GERD and is followed by Dr. [name].  She was born prematurely at 77 weeks Works as a Pharmacist, hospital in a private school.  She is doing person classes at present.  Developed COVID-19 in December 2020.  She was treated with monoclonal antibody as an outpatient and did not require hospitalization.  Overall she recovered well with chest x-ray last month showing clear lungs  She is allergic to penicillins and doxycycline, macrolides have been ineffective in the past She was getting regular allergy shots with Dr. Fredderick Phenix  Interim history: She is doing well with her breathing with no issues.  Continues on Dupixent and inhalers.  She has stopped getting the allergy shots from Dr. Fredderick Phenix.  Outpatient Encounter Medications as of 12/13/2021  Medication Sig   aspirin EC 81 MG tablet Take 81 mg by mouth daily.   Calcium Carbonate-Vitamin D 600-200 MG-UNIT TABS Take 1 tablet by mouth daily.   chlorhexidine (PERIDEX) 0.12 % solution SMARTSIG:By Mouth   Continuous Blood Gluc Sensor (FREESTYLE LIBRE 2 SENSOR) MISC 1 each by Does not apply route every 14  (fourteen) days.   dupilumab (DUPIXENT) 300 MG/2ML prefilled syringe INJECT 1 SYRINGE UNDER THE SKIN EVERY OTHER WEEK   fexofenadine (ALLEGRA ALLERGY) 180 MG tablet Take 180 mg by mouth daily.   fluticasone (FLONASE) 50 MCG/ACT nasal spray SHAKE LIQUID AND USE 2 SPRAYS IN EACH NOSTRIL TWICE DAILY   furosemide (LASIX) 20 MG tablet Take 1 tablet (20MG ) by mouth daily as needed for fluid retention or swelling.   JANUVIA 25 MG tablet Take 1 tablet (25 mg total) by mouth daily.   metFORMIN (GLUCOPHAGE) 1000 MG tablet TAKE 1 TABLET(1000 MG) BY MOUTH TWICE DAILY   montelukast (SINGULAIR) 10 MG tablet TAKE 1 TABLET BY MOUTH EVERY DAY   Multiple Vitamins-Minerals (MULTIVITAMIN WITH MINERALS) tablet Take 1 tablet by mouth daily.   ondansetron (ZOFRAN-ODT) 4 MG disintegrating tablet Take 1 tablet (4 mg total) by mouth every 8 (eight) hours as needed for nausea or vomiting.   pantoprazole (PROTONIX) 40 MG tablet TAKE 1 TABLET(40 MG) BY MOUTH DAILY   potassium chloride SA (KLOR-CON M) 20 MEQ tablet TAKE 1 TABLET(20 MEQ) BY MOUTH DAILY   Probiotic Product (ALIGN) 4 MG CAPS    rosuvastatin (CRESTOR) 10 MG tablet Take 1 tablet (10 mg total) by mouth daily.   sodium chloride HYPERTONIC 3 % nebulizer solution Take by nebulization twice daily as needed   Spacer/Aero-Holding Chambers (AEROCHAMBER MV) inhaler Use as instructed   Spacer/Aero-Holding Chambers (OPTICHAMBER DIAMOND-MD MASK) MISC 1 each by Does not apply route as needed.   SPIRIVA RESPIMAT 1.25 MCG/ACT AERS INHALE  2 PUFFS BY MOUTH EVERY DAY   spironolactone (ALDACTONE) 25 MG tablet Take 0.5 tablets (12.5 mg total) by mouth daily.   SYMBICORT 160-4.5 MCG/ACT inhaler INHALE 2 PUFFS BY MOUTH TWICE DAILY   albuterol (VENTOLIN HFA) 108 (90 Base) MCG/ACT inhaler INHALE 2 PUFFS INTO THE LUNGS EVERY 6 HOURS AS NEEDED (Patient not taking: Reported on 12/13/2021)   No facility-administered encounter medications on file as of 12/13/2021.   Physical Exam: Video  visit  Data Reviewed: Imaging: November 07, 2017 high-resolution CT scan of the chest images : Mild cylindrical bronchiectasis particularly in the left lower lobe, some in the right lower lobe.   10/18/2015 chest CT images  showing normal pulmonary parenchyma with the exception of a 3.4 mm nodule in the left lower lobe.  Chest x-ray 08/30/2020-no active cardiopulmonary disease I have reviewed the images personally.   PFTs: October 2019 ratio 90%, FVC 2.56 L 76% predicted, total lung capacity 4.32 L 84% predicted, DLCO 20.7 mL 84% predicted   Labs: October 2019 complement levels within normal limits, IgG subclasses all normal, alpha-1 antitrypsin level normal, CBC showed 300 absolute eosinophil count   11/03/2015 - Ige - 32  Sputum culture 08/31/2020-normal oropharyngeal flora   Heart catheterization: October 2019 right heart catheterization showed mean PA pressure 24, pulmonary capillary wedge pressure 19, PVR 0.7 Woods units  Assessment:  Bronchiectasis Continue chest vest, Mucinex.  Hypertonic saline for mucociliary clearance Sputum cultures have been negative for AFB   Severe persistent asthma Continue Symbicort, Spiriva, Dupixent She can use over-the-counter Zyrtec for postnasal drip Discontinued allergy shots from Dr. Madie Reno with no worsening of symptoms.  Plan/Recommendations: Continue current asthma regimen Mucociliary clearance with percussion, Mucinex and hypertonic saline    I discussed the assessment and treatment plan with the patient. The patient was provided an opportunity to ask questions and all were answered. The patient agreed with the plan and demonstrated an understanding of the instructions.   The patient was advised to call back or seek an in-person evaluation if the symptoms worsen or if the condition fails to improve as anticipated.   [name] MD [facility] 12/13/2021, 9:26 AM  CC: Gilmore Laroche, MD  [name], MD

## 2021-12-14 MED ORDER — DUPIXENT 300 MG/2ML ~~LOC~~ SOSY: PREFILLED_SYRINGE | SUBCUTANEOUS | 1 refills | Status: AC

## 2021-12-14 NOTE — Telephone Encounter (Signed)
[facility]ill [facility]: [phone]  Dose: 300 mg SQ every 14 days  Last OV: 12/13/21 - video visit Provider: Dr. Isaiah Serge  Next OV: not clearly defined, but ideally 6-9 months  Chesley Mires, PharmD, MPH, BCPS Clinical Pharmacist (Rheumatology and Pulmonology)

## 2021-12-23 ENCOUNTER — Encounter: Payer: Self-pay | Admitting: Pulmonary Disease

## 2021-12-23 ENCOUNTER — Other Ambulatory Visit (HOSPITAL_COMMUNITY): Payer: Self-pay

## 2021-12-23 DIAGNOSIS — J479 Bronchiectasis, uncomplicated: Secondary | ICD-10-CM

## 2021-12-23 DIAGNOSIS — J4550 Severe persistent asthma, uncomplicated: Secondary | ICD-10-CM

## 2021-12-23 MED ORDER — ALBUTEROL SULFATE HFA 108 (90 BASE) MCG/ACT IN AERS: 2.0000 | INHALATION_SPRAY | Freq: Four times a day (QID) | RESPIRATORY_TRACT | 5 refills | Status: AC | PRN

## 2021-12-23 MED ORDER — SYMBICORT 160-4.5 MCG/ACT IN AERO: 2.0000 | INHALATION_SPRAY | Freq: Two times a day (BID) | RESPIRATORY_TRACT | 5 refills | Status: DC

## 2021-12-23 MED ORDER — SPIRIVA RESPIMAT 1.25 MCG/ACT IN AERS: INHALATION_SPRAY | RESPIRATORY_TRACT | 5 refills | Status: AC

## 2021-12-23 MED ORDER — ALBUTEROL SULFATE (2.5 MG/3ML) 0.083% IN NEBU: 2.5000 mg | INHALATION_SOLUTION | Freq: Four times a day (QID) | RESPIRATORY_TRACT | 12 refills | Status: AC | PRN

## 2021-12-23 MED ORDER — SODIUM CHLORIDE 3 % IN NEBU
15.0000 mL | INHALATION_SOLUTION | Freq: Two times a day (BID) | RESPIRATORY_TRACT | 12 refills | Status: AC | PRN
  Filled 2021-12-23 – 2022-03-31 (×3): qty 750, 25d supply, fill #0
  Filled 2022-05-18: qty 750, 25d supply, fill #1
  Filled 2022-07-19: qty 750, 25d supply, fill #2
  Filled 2022-10-20: qty 750, 25d supply, fill #3
  Filled 2022-12-05: qty 750, 25d supply, fill #4

## 2021-12-23 MED ORDER — FLUTICASONE PROPIONATE 50 MCG/ACT NA SUSP: NASAL | 2 refills | Status: DC

## 2021-12-23 MED ORDER — MONTELUKAST SODIUM 10 MG PO TABS: 10.0000 mg | ORAL_TABLET | Freq: Every day | ORAL | 1 refills | Status: AC

## 2021-12-23 NOTE — Telephone Encounter (Signed)
Rx for albuterol neb sol has been sent to pharmacy for pt.

## 2021-12-23 NOTE — Telephone Encounter (Signed)
Dr. Isaiah Serge, please advise if we can send in albuterol neb for pt. Pt is requesting a refill for her albuterol neb but this hasn't been filled by pulmonary before. Please advise if ok to send in. Thanks.

## 2021-12-23 NOTE — Telephone Encounter (Signed)
Okay to refill albuterol nebs.

## 2022-01-11 ENCOUNTER — Encounter: Payer: Self-pay | Admitting: Pulmonary Disease

## 2022-01-11 NOTE — Telephone Encounter (Signed)
Dr. Isaiah Serge, please see pt's message about insurance not covering Symbicort after the new year. Insurance prefers generic Advair, Colburn, and Breo. Please advise. Thanks.

## 2022-01-13 MED ORDER — FLUTICASONE FUROATE-VILANTEROL 100-25 MCG/ACT IN AEPB: 1.0000 | INHALATION_SPRAY | Freq: Every day | RESPIRATORY_TRACT | 5 refills | Status: DC

## 2022-03-05 ENCOUNTER — Other Ambulatory Visit: Payer: Self-pay | Admitting: Pulmonary Disease

## 2022-03-24 ENCOUNTER — Other Ambulatory Visit (HOSPITAL_COMMUNITY): Payer: Self-pay

## 2022-03-24 ENCOUNTER — Other Ambulatory Visit: Payer: Self-pay

## 2022-03-27 ENCOUNTER — Other Ambulatory Visit: Payer: Self-pay

## 2022-03-27 ENCOUNTER — Other Ambulatory Visit (HOSPITAL_COMMUNITY): Payer: Self-pay

## 2022-03-27 ENCOUNTER — Other Ambulatory Visit: Payer: Self-pay | Admitting: Family Medicine

## 2022-03-30 ENCOUNTER — Other Ambulatory Visit (HOSPITAL_COMMUNITY): Payer: Self-pay

## 2022-03-30 ENCOUNTER — Other Ambulatory Visit: Payer: Self-pay

## 2022-03-30 ENCOUNTER — Encounter (HOSPITAL_COMMUNITY): Payer: Self-pay

## 2022-03-31 ENCOUNTER — Other Ambulatory Visit: Payer: Self-pay

## 2022-03-31 ENCOUNTER — Telehealth: Payer: Self-pay | Admitting: Pulmonary Disease

## 2022-03-31 ENCOUNTER — Telehealth (INDEPENDENT_AMBULATORY_CARE_PROVIDER_SITE_OTHER): Payer: BC Managed Care – PPO | Admitting: Internal Medicine

## 2022-03-31 ENCOUNTER — Other Ambulatory Visit (HOSPITAL_COMMUNITY): Payer: Self-pay

## 2022-03-31 DIAGNOSIS — J45901 Unspecified asthma with (acute) exacerbation: Secondary | ICD-10-CM | POA: Diagnosis not present

## 2022-03-31 MED ORDER — PREDNISONE 10 MG PO TABS: ORAL_TABLET | ORAL | 0 refills | Status: DC

## 2022-03-31 MED ORDER — BENZONATATE 200 MG PO CAPS: 200.0000 mg | ORAL_CAPSULE | Freq: Three times a day (TID) | ORAL | 1 refills | Status: AC | PRN

## 2022-03-31 MED ORDER — LEVOFLOXACIN 500 MG PO TABS: 500.0000 mg | ORAL_TABLET | Freq: Every day | ORAL | 0 refills | Status: DC

## 2022-03-31 NOTE — Telephone Encounter (Signed)
Called and spoke with pt who states that she started having cold symptoms 1 week ago. States that it is now in her chest. Pt said she is coughing up yellow phlegm. Denies any wheezing. States that she has had to increase the use of her therapy vest. Pt has had chills but no fever, last temp was 98.7. pt had had a headache. Took a covid test at 3am 2/23 which was negative.  Have scheduled pt an acute mychart visit with Dr. Melvyn Novas at 3:45. Nothing further needed.

## 2022-03-31 NOTE — Progress Notes (Unsigned)
Karen Barnes, female    DOB: 12-22-1960   MRN: [id number]   Brief patient profile:  29  yowf never smoker dx with asthma/ bronchiectasis  self referred to pulmonary clinic for televist 03/31/2022  for asthma/bronchiectasis flair       History of Present Illness  03/31/2022  Pulmonary/ 1st office eval/Amante Fomby  spiriva/ BREO 100 daily / singulair/ dupixant/VEST/ Normal saline typically not productive Chief Complaint  Patient presents with   Acute Visit    Sore throat, drainage, cough up yellow mucus x 3 days.    Virtual Visit via Telephone Note 03/31/2022   I connected with [name] on 03/31/22 at  3:45 PM EST by telephone and verified that I am speaking with the correct person using two identifiers. Pt is at home and this call made from my office with no other participants    I discussed the limitations, risks, security and privacy concerns of performing an evaluation and management service by telephone and the availability of in person appointments. I also discussed with the patient that there may be a patient responsible charge related to this service. The patient expressed understanding and agreed to proceed.     History of Present Illness:   pt who states that she started having cold symptoms 1 week prior to [place] that it is now in her chest. Pt said she is coughing up yellow phlegm. Denies any wheezing. States that she has had to increase the use of her therapy vest. Pt has had chills but no fever, last temp was 98.7. pt had had a headache. Took a covid test at 3am 2/23 which was negative.  Rx mucinex / usually takes pred and levaquin "the only abx that works"    No obvious  patterns in day to day or daytime variability or assoc  hemoptysis or cp or chest tightness, subjective wheeze or overt sinus or hb symptoms.    Also denies any obvious fluctuation of symptoms with weather or environmental changes or other aggravating or alleviating factors except as outlined  above.   Meds reviewed/ med reconciliation completed     Current Meds  Medication Sig   albuterol (PROVENTIL) (2.5 MG/3ML) 0.083% nebulizer solution Take 3 mLs (2.5 mg total) by nebulization every 6 (six) hours as needed for wheezing or shortness of breath.   albuterol (VENTOLIN HFA) 108 (90 Base) MCG/ACT inhaler Inhale 2 puffs into the lungs every 6 (six) hours as needed.   aspirin EC 81 MG tablet Take 81 mg by mouth daily.   Calcium Carbonate-Vitamin D 600-200 MG-UNIT TABS Take 1 tablet by mouth daily.   chlorhexidine (PERIDEX) 0.12 % solution SMARTSIG:By Mouth   Continuous Blood Gluc Sensor (FREESTYLE LIBRE 2 SENSOR) MISC 1 each by Does not apply route every 14 (fourteen) days.   dupilumab (DUPIXENT) 300 MG/2ML prefilled syringe INJECT 1 SYRINGE UNDER THE SKIN EVERY OTHER WEEK   fexofenadine (ALLEGRA ALLERGY) 180 MG tablet Take 180 mg by mouth daily.   fluticasone (FLONASE) 50 MCG/ACT nasal spray SHAKE LIQUID AND USE 2 SPRAYS IN EACH NOSTRIL TWICE DAILY   fluticasone furoate-vilanterol (BREO ELLIPTA) 100-25 MCG/ACT AEPB Inhale 1 puff into the lungs daily.   furosemide (LASIX) 20 MG tablet Take 1 tablet ('20MG'$ ) by mouth daily as needed for fluid retention or swelling.   JANUVIA 25 MG tablet Take 1 tablet (25 mg total) by mouth daily.   metFORMIN (GLUCOPHAGE) 1000 MG tablet TAKE 1 TABLET(1000 MG) BY MOUTH TWICE DAILY  montelukast (SINGULAIR) 10 MG tablet Take 1 tablet (10 mg total) by mouth daily.   Multiple Vitamins-Minerals (MULTIVITAMIN WITH MINERALS) tablet Take 1 tablet by mouth daily.   ondansetron (ZOFRAN-ODT) 4 MG disintegrating tablet Take 1 tablet (4 mg total) by mouth every 8 (eight) hours as needed for nausea or vomiting.   pantoprazole (PROTONIX) 40 MG tablet TAKE 1 TABLET(40 MG) BY MOUTH DAILY   potassium chloride SA (KLOR-CON M) 20 MEQ tablet TAKE 1 TABLET(20 MEQ) BY MOUTH DAILY   Probiotic Product (ALIGN) 4 MG CAPS    rosuvastatin (CRESTOR) 10 MG tablet Take 1 tablet (10  mg total) by mouth daily.   sodium chloride HYPERTONIC 3 % nebulizer solution Take 15 mLs by nebulization 2 (two) times daily as needed.   Spacer/Aero-Holding Chambers (AEROCHAMBER MV) inhaler Use as instructed   Spacer/Aero-Holding Chambers (OPTICHAMBER DIAMOND-MD MASK) MISC 1 each by Does not apply route as needed.   spironolactone (ALDACTONE) 25 MG tablet Take 0.5 tablets (12.5 mg total) by mouth daily.   Tiotropium Bromide Monohydrate (SPIRIVA RESPIMAT) 1.25 MCG/ACT AERS INHALE 2 PUFFS BY MOUTH EVERY DAY          Observations/Objective: Speaking in full sentences with good voice texture and no conversational sob / somewhat harsh dry sounding cough    Assessment and Plan: See problem list for active a/p's  Rec levaquin 500 x 7 d Prednisone 10 mg x 6 days taper Tessalon 200  prn  F/u one week if not improved    Follow Up Instructions: See avs for instructions unique to this ov which includes revised/ updated med list     I discussed the assessment and treatment plan with the patient. The patient was provided an opportunity to ask questions and all were answered. The patient agreed with the plan and demonstrated an understanding of the instructions.   The patient was advised to call back or seek an in-person evaluation if the symptoms worsen or if the condition fails to improve as anticipated.  I provided 20 minutes of non-face-to-face time during this encounter with pt new to me   Christinia Gully, MD

## 2022-03-31 NOTE — Telephone Encounter (Signed)
Pt. Sick wanted a e-visit with mannam but nothing in his sched. But needs some meds called in to pharmacy for cold advised nurse will call back

## 2022-04-01 ENCOUNTER — Encounter: Payer: Self-pay | Admitting: Internal Medicine

## 2022-04-01 NOTE — Assessment & Plan Note (Signed)
See televisit 03/31/2022

## 2022-04-03 ENCOUNTER — Telehealth: Payer: Self-pay | Admitting: Pulmonary Disease

## 2022-04-03 NOTE — Telephone Encounter (Signed)
Patient states husband has Covid 19. Would like some guidance on what she should do. Had E visit with Dr. Melvyn Novas 03/31/2022. Patient phone number is [phone].

## 2022-04-03 NOTE — Telephone Encounter (Signed)
ATC x1 LMTCB 04/03/22 @ 5:11 PM

## 2022-04-04 NOTE — Telephone Encounter (Signed)
PT ret our call. Does she need to be on a Prophilactic (SP?)? Also, her phelgm has changed color to a brown color. Pls try again. Adv her to stay close to phone.

## 2022-04-04 NOTE — Telephone Encounter (Signed)
Stay on Levaquin 500 there is no difference between using 750/500  500 mg is used for 7 days whereas 750 mg used for 5 days  Same effect

## 2022-04-04 NOTE — Telephone Encounter (Signed)
Spoke to pt and she states her husband has covid and she has tested neg for covid but she feels like she may be having sym. Of pneumonia her mocus has went from thick yellow to yellowish brown. And was wondering if she needed to go up on her levofloxacin (Levaquin) 500 to 750 MG. I informed pt I will send a provider a message. Pt verbalized understanding.

## 2022-04-04 NOTE — Telephone Encounter (Signed)
Spoke to pt reiterated what Dr Ander Slade stated pt verbalized understanding. Nothing further needed.

## 2022-04-06 ENCOUNTER — Encounter: Payer: Self-pay | Admitting: Internal Medicine

## 2022-04-07 ENCOUNTER — Telehealth: Payer: Self-pay | Admitting: Internal Medicine

## 2022-04-07 MED ORDER — LEVOFLOXACIN 500 MG PO TABS: 500.0000 mg | ORAL_TABLET | Freq: Every day | ORAL | 0 refills | Status: DC

## 2022-04-07 NOTE — Telephone Encounter (Signed)
Patient was just seen by Dr. Melvyn Novas, will forward message to him

## 2022-04-07 NOTE — Telephone Encounter (Signed)
Please see PT Mychart message to Dr. Melvyn Novas. Pt did not want to go into the weekend if Dr. decides on second round of meds. Pls call PT to advise @ [phone]

## 2022-04-07 NOTE — Telephone Encounter (Signed)
I would do levaquin x 5 more day since mucus is still discovered  The FNP is right to worry about the combination of pred and levaquin so rec she call be next week if not bette to talk over ongoing levaquin or pred with Dr [name] - if he agrees he can right a refillable rx going forward since she says they have an agreement she only needs to be seen once a year.

## 2022-04-07 NOTE — Telephone Encounter (Signed)
Following My Chart message was received yesterday 04/06/22:  Dr. Melvyn Novas,   In a phone visit ( due to living at [facility], [place]) on Feb 23 you prescribed 500 mg. Levaquin, a Prednisone 6 day step down and Perles for Cough.  I did improve for a couple of days and then my phlegm went from a light thick yellow color to a brownish yellow.   Today, I have seen a FNP in my doctor's office locally and was referred for a chest x-ray.  My results are in. I do not have pneumonia, but I am still not feeling well.  I have not run fever, but I am feeling some struggles with my breathing with some breathlessness and periodic episodes of  slight wheezing.  I am still coughing up that yucky brownish mucus.   My local doctor's office does not use the treatment regimen that I have traditionally taken.  The FNP told me today she does not write Levaquin with Prednisone.   My thoughts are that I may need additional meds to  clear this up.  That is not unusual for me. There have been many times, where it has taken two or more repeated courses of Levaquin and Prednisone to get me well.   In addition to meds, I am following my normal treatment plan for Asthma/Bronchiectasis, Dupixent shots every two weeks and Twice or More Daily Nebulizing (Sodium Chloride vials) and Therapy Vest Treatments.   I finished Prednisone today (04/06/22) and I will take my last 500 mg. Levaquin tomorrow (04/07/22). Please advise.  Also, I failed to mention, Dr. [name] is my Pulmonologist.   Thank you!   Karen Barnes  Pt was called and left detailed message that My Chart message would be addressed and we would call back today with DOD recommendations. Tammy can you please advise as Dr. [name] is unavailable today?

## 2022-04-07 NOTE — Telephone Encounter (Signed)
Called and spoke with patient. She verbalized understanding.   Nothing further needed at time of call.  

## 2022-04-07 NOTE — Telephone Encounter (Signed)
Message addressed in telephone encounter from 04/07/22. Closing this encounter

## 2022-04-12 ENCOUNTER — Telehealth: Payer: Self-pay | Admitting: Pulmonary Disease

## 2022-04-12 NOTE — Telephone Encounter (Signed)
Please see last encounter. Dr. Melvyn Novas States:  I would do levaquin x 5 more day since mucus is still discovered   The FNP is right to worry about the combination of pred and levaquin so rec she call be next week if not bette to talk over ongoing levaquin or pred with Dr [name] - if he agrees he can right a refillable rx going forward since she says they have an agreement she only needs to be seen once a year.       Usually she works w/Dr. [name]. She still has exacerbation and wondered why she was not offered Levaquin and Pred together. This has always worked for her in the past.  She would like another round of both.   SOB Effort to breath Cough has broken up some but feels this will turn into something much worse. In the past several rounds of meds have been needed.  Please call to advise. [phone]

## 2022-04-13 ENCOUNTER — Encounter: Payer: Self-pay | Admitting: Internal Medicine

## 2022-04-13 ENCOUNTER — Telehealth: Payer: BC Managed Care – PPO | Admitting: Internal Medicine

## 2022-04-13 DIAGNOSIS — J45901 Unspecified asthma with (acute) exacerbation: Secondary | ICD-10-CM

## 2022-04-13 MED ORDER — PANTOPRAZOLE SODIUM 40 MG PO TBEC: DELAYED_RELEASE_TABLET | ORAL | 2 refills | Status: DC

## 2022-04-13 MED ORDER — BUDESONIDE 0.25 MG/2ML IN SUSP: RESPIRATORY_TRACT | 12 refills | Status: AC

## 2022-04-13 NOTE — Telephone Encounter (Signed)
Spoke with the pt  She is c/o continued cough despite multiple rounds of abx  Unable to come in for visit as she lives 3 hours away  I have scheduled her for video visit for 10:15 am today with Dr Melvyn Novas Nothing further needed

## 2022-04-13 NOTE — Telephone Encounter (Signed)
Karen Barnes with Answering Service states patient having symptoms of asthma and cough. Patient phone number is [phone].

## 2022-04-13 NOTE — Assessment & Plan Note (Signed)
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

## 2022-04-13 NOTE — Patient Instructions (Addendum)
Stop hypertonic saline and BREO  for now and just  use albuterol per nebulizer  combined  each time with  budesonide 0.25 mg up to every 4 hours as need for cough/ wheeze/ short of breth  Strongly advise a sputum sample before continuing more antibiotics  Change Protonix to Take 30- 60 min before your first and last meals of the day   GERD (REFLUX)  is an extremely common cause of respiratory symptoms just like yours , many times with no obvious heartburn at all.    It can be treated with medication, but also with lifestyle changes including elevation of the head of your bed (ideally with 6 -8inch blocks under the headboard of your bed),  Smoking cessation, avoidance of late meals, excessive alcohol, and avoid fatty foods, chocolate, peppermint, colas, red wine, and acidic juices such as orange juice.  NO MINT OR MENTHOL PRODUCTS SO NO COUGH DROPS  USE SUGARLESS CANDY INSTEAD (Jolley ranchers or Stover's or Life Savers) or even ice chips will also do - the key is to swallow to prevent all throat clearing. NO OIL BASED VITAMINS - use powdered substitutes.  Avoid fish oil when coughing.    [place] and call us as needed in meantime

## 2022-04-13 NOTE — Progress Notes (Signed)
Karen Barnes, female    DOB: 09-10-1960   MRN: [id number]   Brief patient profile:  1  yowf never smoker dx with asthma/ bronchiectasis  self referred to pulmonary clinic for televist 03/31/2022  for asthma/bronchiectasis flair       History of Present Illness  03/31/2022  Pulmonary/ 1st office eval/Karen Barnes  spiriva/ BREO 100 daily / singulair/ dupixant/VEST/ Normal saline typically not productive Chief Complaint  Patient presents with   Acute Visit    Sore throat, drainage, cough up yellow mucus x 3 days.  Rec Rec levaquin 500 x 7 d Prednisone 10 mg x 6 days taper Tessalon 200  prn  F/u one week if not improved    04/13/2022  Virtual Visit via Telephone Note 04/13/2022   I connected with Karen Barnes on 04/13/22 at 10:15 AM EST by telephone and verified that I am speaking with the correct person using two identifiers. Pt is at home and this call made from my office with no other participants    I discussed the limitations, risks, security and privacy concerns of performing an evaluation and management service by telephone and the availability of in person appointments. I also discussed with the patient that there may be a patient responsible charge related to this service. The patient expressed understanding and agreed to proceed.   History of Present Illness: March 1st saw NP at [facility] with nl cxr around day 7 of levquin  then got 5 more days levaquin  Still coughing slt yellow  Also needing neb bid  Wakes up every 3rd night and needs neb and last day of prednisone was near the end of 1st round of abx and thinks she needs more   No obvious patterns in day to day or daytime variability or assoc mucus plugs or hemoptysis or cp or chest tightness,  overt sinus or hb symptoms.    Also denies any obvious fluctuation of symptoms with weather or environmental changes or other aggravating or alleviating factors except as outlined above.   Meds reviewed/ med reconciliation  completed     Current Meds  Medication Sig   albuterol (PROVENTIL) (2.5 MG/3ML) 0.083% nebulizer solution Take 3 mLs (2.5 mg total) by nebulization every 6 (six) hours as needed for wheezing or shortness of breath.   aspirin EC 81 MG tablet Take 81 mg by mouth daily.        Calcium Carbonate-Vitamin D 600-200 MG-UNIT TABS Take 1 tablet by mouth daily.   chlorhexidine (PERIDEX) 0.12 % solution SMARTSIG:By Mouth   Continuous Blood Gluc Sensor (FREESTYLE LIBRE 2 SENSOR) MISC 1 each by Does not apply route every 14 (fourteen) days.   dupilumab (DUPIXENT) 300 MG/2ML prefilled syringe INJECT 1 SYRINGE UNDER THE SKIN EVERY OTHER WEEK   fexofenadine (ALLEGRA ALLERGY) 180 MG tablet Take 180 mg by mouth daily.   fluticasone (FLONASE) 50 MCG/ACT nasal spray SHAKE LIQUID AND USE 2 SPRAYS IN EACH NOSTRIL TWICE DAILY   furosemide (LASIX) 20 MG tablet Take 1 tablet ('20MG'$ ) by mouth daily as needed for fluid retention or swelling.   JANUVIA 25 MG tablet Take 1 tablet (25 mg total) by mouth daily.   metFORMIN (GLUCOPHAGE) 1000 MG tablet TAKE 1 TABLET(1000 MG) BY MOUTH TWICE DAILY   montelukast (SINGULAIR) 10 MG tablet Take 1 tablet (10 mg total) by mouth daily.   Multiple Vitamins-Minerals (MULTIVITAMIN WITH MINERALS) tablet Take 1 tablet by mouth daily.   ondansetron (ZOFRAN-ODT) 4 MG disintegrating tablet Take 1  tablet (4 mg total) by mouth every 8 (eight) hours as needed for nausea or vomiting.   pantoprazole (PROTONIX) 40 MG tablet Take 30- 60 min before your first and last meals of the day   potassium chloride SA (KLOR-CON M) 20 MEQ tablet TAKE 1 TABLET(20 MEQ) BY MOUTH DAILY   Probiotic Product (ALIGN) 4 MG CAPS    rosuvastatin (CRESTOR) 10 MG tablet Take 1 tablet (10 mg total) by mouth daily.   sodium chloride HYPERTONIC 3 % nebulizer solution Take 15 mLs by nebulization 2 (two) times daily as needed.   Spacer/Aero-Holding Chambers (AEROCHAMBER MV) inhaler Use as instructed   Spacer/Aero-Holding  Chambers (OPTICHAMBER DIAMOND-MD MASK) MISC 1 each by Does not apply route as needed.   spironolactone (ALDACTONE) 25 MG tablet Take 0.5 tablets (12.5 mg total) by mouth daily.   Tiotropium Bromide Monohydrate (SPIRIVA RESPIMAT) 1.25 MCG/ACT AERS INHALE 2 PUFFS BY MOUTH EVERY DAY   [DISCONTINUED] fluticasone furoate-vilanterol (BREO ELLIPTA) 100-25 MCG/ACT AEPB Inhale 1 puff into the lungs daily.   [DISCONTINUED] fluticasone furoate-vilanterol (BREO ELLIPTA) 100-25 MCG/ACT AEPB Inhale 1 puff into the lungs daily.   [DISCONTINUED] pantoprazole (PROTONIX) 40 MG tablet TAKE 1 TABLET(40 MG) BY MOUTH DAILY         Observations/Objective: Speaking in full sentences/ occ congested sounding cough   Assessment and Plan: See problem list for active a/p's   Follow Up Instructions: See avs for instructions unique to this ov which includes revised/ updated med list     I discussed the assessment and treatment plan with the patient. The patient was provided an opportunity to ask questions and all were answered. The patient agreed with the plan and demonstrated an understanding of the instructions.   The patient was advised to call back or seek an in-person evaluation if the symptoms worsen or if the condition fails to improve as anticipated.  I provided 23 minutes of non-face-to-face time during this encounter.   Christinia Gully, MD

## 2022-04-17 ENCOUNTER — Other Ambulatory Visit: Payer: Self-pay | Admitting: *Deleted

## 2022-04-17 MED ORDER — SPIRONOLACTONE 25 MG PO TABS: 12.5000 mg | ORAL_TABLET | Freq: Every day | ORAL | 0 refills | Status: AC

## 2022-05-18 ENCOUNTER — Other Ambulatory Visit (HOSPITAL_COMMUNITY): Payer: Self-pay

## 2022-05-18 ENCOUNTER — Other Ambulatory Visit: Payer: Self-pay

## 2022-06-30 ENCOUNTER — Other Ambulatory Visit: Payer: Self-pay

## 2022-06-30 DIAGNOSIS — I10 Essential (primary) hypertension: Secondary | ICD-10-CM

## 2022-06-30 DIAGNOSIS — I5032 Chronic diastolic (congestive) heart failure: Secondary | ICD-10-CM

## 2022-06-30 DIAGNOSIS — R002 Palpitations: Secondary | ICD-10-CM

## 2022-06-30 DIAGNOSIS — E785 Hyperlipidemia, unspecified: Secondary | ICD-10-CM

## 2022-06-30 MED ORDER — FUROSEMIDE 20 MG PO TABS: ORAL_TABLET | ORAL | 0 refills | Status: AC

## 2022-07-17 ENCOUNTER — Other Ambulatory Visit: Payer: Self-pay | Admitting: Pulmonary Disease

## 2022-07-17 DIAGNOSIS — J4550 Severe persistent asthma, uncomplicated: Secondary | ICD-10-CM

## 2022-07-19 ENCOUNTER — Other Ambulatory Visit: Payer: Self-pay

## 2022-07-19 ENCOUNTER — Other Ambulatory Visit: Payer: Self-pay | Admitting: Pulmonary Disease

## 2022-07-19 ENCOUNTER — Other Ambulatory Visit (HOSPITAL_COMMUNITY): Payer: Self-pay

## 2022-08-01 ENCOUNTER — Telehealth: Payer: Self-pay | Admitting: Pharmacist

## 2022-08-01 NOTE — Telephone Encounter (Signed)
PHARMACY CALLING IN [facility] PATIENT NEEDS A NEW PRESCRIPTION FOR dupilumab (DUPIXENT) 300 MG/2ML prefilled syringe [[id number]]

## 2022-08-01 NOTE — Telephone Encounter (Signed)
Patient has transferred care to [facility]. Further refills for Dupixent will not be sent under Dr. Shirlee More name  Chesley Mires, PharmD, MPH, BCPS, CPP Clinical Pharmacist (Rheumatology and Pulmonology)

## 2022-08-07 ENCOUNTER — Other Ambulatory Visit: Payer: Self-pay | Admitting: Pulmonary Disease

## 2022-08-07 DIAGNOSIS — J4550 Severe persistent asthma, uncomplicated: Secondary | ICD-10-CM

## 2022-08-28 ENCOUNTER — Other Ambulatory Visit: Payer: Self-pay | Admitting: Pulmonary Disease

## 2022-10-16 ENCOUNTER — Telehealth: Payer: Self-pay

## 2022-10-16 NOTE — Telephone Encounter (Signed)
Received faxed PA Renewal form from [facility]. Forwarded fax to pt's new provider.  Phone#: [phone] Fax#: [phone]

## 2022-10-20 ENCOUNTER — Other Ambulatory Visit (HOSPITAL_COMMUNITY): Payer: Self-pay

## 2022-10-23 ENCOUNTER — Encounter: Payer: Self-pay | Admitting: Pharmacist

## 2022-10-23 ENCOUNTER — Other Ambulatory Visit: Payer: Self-pay

## 2022-10-23 ENCOUNTER — Other Ambulatory Visit (HOSPITAL_COMMUNITY): Payer: Self-pay

## 2022-10-24 ENCOUNTER — Other Ambulatory Visit: Payer: Self-pay | Admitting: Pulmonary Disease

## 2022-11-10 ENCOUNTER — Ambulatory Visit: Payer: BC Managed Care – PPO | Admitting: Internal Medicine

## 2022-11-10 ENCOUNTER — Encounter: Payer: Self-pay | Admitting: Internal Medicine

## 2022-11-10 VITALS — BP 110/68 | HR 72 | Ht 64.0 in | Wt 189.0 lb

## 2022-11-10 DIAGNOSIS — E042 Nontoxic multinodular goiter: Secondary | ICD-10-CM

## 2022-11-10 LAB — TSH: TSH: 1.47 u[IU]/mL (ref 0.35–5.50)

## 2022-11-10 LAB — T4, FREE: Free T4: 1.09 ng/dL (ref 0.60–1.60)

## 2022-11-10 NOTE — Progress Notes (Signed)
Name: Karen Barnes  MRN/ DOB: [id number], 1960-04-29    Age/ Sex: 62 y.o., female     PCP: Gilmore Laroche, MD   Reason for Endocrinology Evaluation: Thyroid nodule      Initial Endocrinology Clinic Visit: 12/28/2017    PATIENT IDENTIFIER: Karen Barnes is a 62 y.o., female with a past medical history of T2DM, Asthma, GERD,Thyroid nodule and bronchiectasis. She has followed with [facility] since 12/28/2017 for consultative assistance with management of her thyroid nodule.   HISTORICAL SUMMARY: The patient was first diagnosed with right thyroid nodule in 11/2017 during CT scan of the chest at was performed for evaluation of cough.  S/P FNA with benign cytology in October, 2019  Per patient she has had intermittently low TSH.   SUBJECTIVE:   Today (11/10/2022):  Karen Barnes is here for a follow up on right thyroid nodule.  Patient follows with cardiology for CHF on fluid and salt restriction She continues to follow up with pulmonary for severe persistent asthma and bronchiectasis   Denies local neck swelling  Has rare palpitations , has diltiazem as needed  Has noted mild tremors , especially with abnormal arm positioning Lost both parents in the past 14 months  Has occasional constipation that she attributes to Zofran      HISTORY:  Past Medical History:  Past Medical History:  Diagnosis Date   Asthma, moderate persistent    sees Dr Irena Cords   Atrial tachycardia [facility])    Chronic diastolic CHF (congestive heart failure) (HCC)    COVID-19 virus infection 01/2019   Diabetes mellitus    A1C 6.2---->  2009   Family history of adverse reaction to anesthesia    sister had cardiac arrest with rhinoplasty   Fibrocystic breast    GERD (gastroesophageal reflux disease)    HH (hiatus hernia) 09/2014   Dr. Ewing Schlein   History of echocardiogram    Echo 5/19: EF 55-60, normal wall motion, PASP 41   History of hiatal hernia    History of nuclear stress  test    Nuclear stress test 5/19:  EF 64, normal perfusion, Low risk   Hot flashes    Hyperlipidemia    Premature atrial contractions    Pulmonary hypertension (HCC)    PVC's (premature ventricular contractions)    Recurrent cystitis    took  postcoital antibiotic for years, symptoms better after her hysterectomy 1996, symptoms re-surface in 2011   RUQ abdominal pain 2016   EGD showed small HH, otherwise normal   Wears glasses    Past Surgical History:  Past Surgical History:  Procedure Laterality Date   ABDOMINAL HYSTERECTOMY  1996   no oophorectomy, d/tendometriosis and fibroids approximately 1996   BREAST EXCISIONAL BIOPSY Right 03/12/2014   [facility]   BREAST LUMPECTOMY WITH RADIOACTIVE SEED LOCALIZATION Right 03/12/2014   Procedure: BREAST LUMPECTOMY WITH RADIOACTIVE SEED LOCALIZATION;  Surgeon: Harriette Bouillon, MD;  Location: [facility];  Service: General;  Laterality: Right;   BREAST SURGERY     Lumpectomy-- BENIGN    CATARACT EXTRACTION Bilateral 2011   CESAREAN SECTION     COSMETIC SURGERY  01/2017   MonaLisa Touch w/ GYN   DILATION AND CURETTAGE OF UTERUS     LIPOMA EXCISION  1978   RIGHT HEART CATH N/A 11/26/2017   Procedure: RIGHT HEART CATH;  Surgeon: Laurey Morale, MD;  Location: [facility] INVASIVE CV LAB;  Service: Cardiovascular;  Laterality: N/A;   UPPER GASTROINTESTINAL  ENDOSCOPY     UPPER GI ENDOSCOPY  09/11/2014   Small Hiatus Hernia, Dr. Ewing Schlein   Social History:  reports that she has never smoked. She has never used smokeless tobacco. She reports that she does not drink alcohol and does not use drugs. Family History:  Family History  Problem Relation Age of Onset   Mitral valve prolapse Mother    Breast cancer Mother 26       M had ca insitu (age 24), aunt    Dementia Mother    Heart disease Father 53       early    Diabetes Father    Kidney failure Father    Sleep apnea Father    Brain cancer Other        2 fam members    Lung cancer Other         uncle, heavy smoker   Breast cancer Maternal Aunt 55   CAD Paternal Grandmother 63   CAD Paternal Grandfather 58   Colon cancer Neg Hx      HOME MEDICATIONS: Allergies as of 11/10/2022       Reactions   Butorphanol Tartrate Other (See Comments)   REACTION: hallucinations   Hydrocodone Other (See Comments)   HALLUCINATIONS   Roxicet [oxycodone-acetaminophen] Other (See Comments)   HALLUCINATIONS   Butorphanol Other (See Comments)   Doxycycline    DOES NOT WORK FOR PATIENT   Penicillins Hives   Occurred at age 81/CHILDHOOD ALLERGY Has patient had a PCN reaction causing immediate rash, facial/tongue/throat swelling, SOB or lightheadedness with hypotension: Unknown Has patient had a PCN reaction causing severe rash involving mucus membranes or skin necrosis: Unknown Has patient had a PCN reaction that required hospitalization: Unknown Has patient had a PCN reaction occurring within the last 10 years: No If all of the above answers are "NO", then may proceed with Cephalosporin use.        Medication List        Accurate as of November 10, 2022 10:33 AM. If you have any questions, ask your nurse or doctor.          STOP taking these medications    pantoprazole 40 MG tablet Commonly known as: Protonix Stopped by: Johnney Ou Makiya Jeune       TAKE these medications    AeroChamber MV inhaler Use as instructed   OptiChamber Diamond-Md Mask Misc 1 each by Does not apply route as needed.   albuterol 108 (90 Base) MCG/ACT inhaler Commonly known as: VENTOLIN HFA Inhale 2 puffs into the lungs every 6 (six) hours as needed.   albuterol (2.5 MG/3ML) 0.083% nebulizer solution Commonly known as: PROVENTIL Take 3 mLs (2.5 mg total) by nebulization every 6 (six) hours as needed for wheezing or shortness of breath.   Align 4 MG Caps   Align 4 MG Caps Take 1 capsule by mouth daily.   Allegra Allergy 180 MG tablet Generic drug: fexofenadine Take 180 mg by mouth daily.    aspirin EC 81 MG tablet Take 81 mg by mouth daily.   benzonatate 200 MG capsule Commonly known as: TESSALON Take 1 capsule (200 mg total) by mouth 3 (three) times daily as needed for cough.   Breo Ellipta 200-25 MCG/ACT Aepb Generic drug: fluticasone furoate-vilanterol Inhale 1 puff into the lungs daily.   budesonide 0.25 MG/2ML nebulizer solution Commonly known as: Pulmicort 0.25 mg up to 4 x daily in neb   Calcium Carbonate-Vitamin D 600-200 MG-UNIT Tabs Take 1 tablet  by mouth daily.   Calcium High Potency/Vitamin D 600-5 MG-MCG Tabs Generic drug: Calcium Carb-Cholecalciferol Take 1 tablet by mouth 2 (two) times daily.   chlorhexidine 0.12 % solution Commonly known as: PERIDEX SMARTSIG:By Mouth   dexlansoprazole 60 MG capsule Commonly known as: DEXILANT Take 60 mg by mouth daily.   diltiazem 30 MG tablet Commonly known as: CARDIZEM Take 30 mg by mouth every 6 (six) hours as needed.   Dupixent 300 MG/2ML prefilled syringe Generic drug: dupilumab INJECT 1 SYRINGE UNDER THE SKIN EVERY OTHER WEEK   fluticasone 50 MCG/ACT nasal spray Commonly known as: FLONASE SHAKE LIQUID AND USE 2 SPRAYS IN EACH NOSTRIL TWICE DAILY   FreeStyle Libre 2 Sensor Misc 1 each by Does not apply route every 14 (fourteen) days.   furosemide 20 MG tablet Commonly known as: LASIX Take 1 tablet (20MG ) by mouth daily as needed for fluid retention or swelling.   Januvia 25 MG tablet Generic drug: sitaGLIPtin Take 1 tablet (25 mg total) by mouth daily.   Klor-Con 20 MEQ packet Generic drug: potassium chloride Take 20 mEq by mouth once.   metFORMIN 1000 MG tablet Commonly known as: GLUCOPHAGE TAKE 1 TABLET(1000 MG) BY MOUTH TWICE DAILY   montelukast 10 MG tablet Commonly known as: SINGULAIR Take 1 tablet (10 mg total) by mouth daily.   multivitamin with minerals tablet Take 1 tablet by mouth daily.   ondansetron 4 MG disintegrating tablet Commonly known as: ZOFRAN-ODT Take 1  tablet (4 mg total) by mouth every 8 (eight) hours as needed for nausea or vomiting.   potassium chloride SA 20 MEQ tablet Commonly known as: KLOR-CON M TAKE 1 TABLET(20 MEQ) BY MOUTH DAILY   predniSONE 20 MG tablet Commonly known as: DELTASONE take bid x 7 days, then qd x 7 days.   rosuvastatin 10 MG tablet Commonly known as: Crestor Take 1 tablet (10 mg total) by mouth daily.   sodium chloride HYPERTONIC 3 % nebulizer solution Take 15 mLs by nebulization 2 (two) times daily as needed.   Spiriva Respimat 1.25 MCG/ACT Aers Generic drug: Tiotropium Bromide Monohydrate INHALE 2 PUFFS BY MOUTH EVERY DAY   spironolactone 25 MG tablet Commonly known as: Aldactone Take 0.5 tablets (12.5 mg total) by mouth daily.          OBJECTIVE:   PHYSICAL EXAM: VS: BP 110/68 (BP Location: Left Arm, Patient Position: Sitting, Cuff Size: Large)   Pulse 72   Ht 5\' 4"  (1.626 m)   Wt 189 lb (85.7 kg)   SpO2 94%   BMI 32.44 kg/m    EXAM: General: Pt appears well and is in NAD  Neck: General: Supple without adenopathy. Thyroid:  No goiter or nodules appreciated.   Lungs: Clear with good BS bilat   Heart: Auscultation: RRR.  Abdomen: soft, nontender  Extremities:  BL LE: No pretibial edema normal ROM and strength.  Mental Status: Judgment, insight: Intact Orientation: Oriented to time, place, and person Mood and affect: No depression, anxiety, or agitation     DATA REVIEWED:    Latest Reference Range & Units 11/10/22 10:58  TSH 0.35 - 5.50 uIU/mL 1.47  T4,Free(Direct) 0.60 - 1.60 ng/dL 1.47     Thyroid ultrasound 11/18/2021  The right lobe of the thyroid measures 4.7 x 2.1 x 1.4 cm.  The left lobe of the thyroid measures 4.6 x 1.7 x 1.5 cm.  The isthmus measures 0.3 cm The thyroid was mildly heterogeneous in echotexture Right sided nodules, 2 of which  are subcentimeter.  Largest nodule, mid thyroid, measures 1.3 x 0.9 x 0.8 cm in size.  This has a spongiform appearance with  internal foci of colloid, increased through-transmission suggestive of colloid cyst.  Sonographic notes indicate a previous benign biopsy in 2019.  Patient has outside thyroid imaging on [facility] Everywhere.  TI-RADS level 2.  The smaller lesions demonstrate similar characteristic   Impression Right-sided thyroid nodules, TI-RADS level 2, no indication for FNA or continued imaging surveillance     FNA 12/04/2017  THYROID, FINE NEEDLE ASPIRATION RIGHT LOBE, RIGHT MID POLE (SPECIMEN 1 OF 1 COLLECTED 12/04/17) CONSISTENT WITH BENIGN FOLLICULAR NODULE (BETHESDA CATEGORY II).  ASSESSMENT / PLAN / RECOMMENDATIONS:   Multinodular Goiter:    - She is clinically euthyroid.  - S/P FNA of the right thyroid nodule in October,2019 with benign cytology.  -Her last thyroid ultrasound 11/2021 showed stability of the nodules with spongiform features and no further monitoring was recommended -Reassurance provided today, patient will be turned over to her PCP for annual clinical checkups -TFTs are normal    F/u as needed   Signed electronically by: Lyndle Herrlich, MD  [facility] Endocr[facility]  [facility] [address] [place], [place] [address] Phone: [phone] FAX: [phone]      CC: Gilmore Laroche, MD [address] [place] [address] Phone: [phone]  Fax: [phone]   Return to Endocrinology clinic as below: Future Appointments  Date Time Provider Department Center  11/10/2022 11:10 AM Ashby Moskal, Konrad Dolores, MD [facility] None

## 2022-12-05 ENCOUNTER — Other Ambulatory Visit (HOSPITAL_COMMUNITY): Payer: Self-pay

## 2023-03-02 ENCOUNTER — Other Ambulatory Visit (HOSPITAL_COMMUNITY): Payer: Self-pay

## 2023-03-02 ENCOUNTER — Other Ambulatory Visit: Payer: Self-pay

## 2023-03-02 MED ORDER — SODIUM CHLORIDE 3 % IN NEBU
INHALATION_SOLUTION | RESPIRATORY_TRACT | 11 refills | Status: AC
  Filled 2023-03-02: qty 750, 30d supply, fill #0
  Filled 2023-05-14: qty 750, 30d supply, fill #1
  Filled 2023-07-05: qty 750, 30d supply, fill #2
  Filled 2023-08-27: qty 750, 30d supply, fill #3
  Filled 2023-11-29: qty 750, 30d supply, fill #4
  Filled 2024-02-08: qty 750, 30d supply, fill #5

## 2023-05-14 ENCOUNTER — Other Ambulatory Visit (HOSPITAL_COMMUNITY): Payer: Self-pay

## 2023-06-02 ENCOUNTER — Other Ambulatory Visit: Payer: Self-pay | Admitting: Pulmonary Disease

## 2023-07-05 ENCOUNTER — Other Ambulatory Visit (HOSPITAL_COMMUNITY): Payer: Self-pay

## 2023-08-27 ENCOUNTER — Other Ambulatory Visit (HOSPITAL_COMMUNITY): Payer: Self-pay

## 2023-08-28 ENCOUNTER — Other Ambulatory Visit (HOSPITAL_COMMUNITY): Payer: Self-pay

## 2023-11-07 ENCOUNTER — Other Ambulatory Visit: Payer: Self-pay

## 2023-11-29 ENCOUNTER — Other Ambulatory Visit (HOSPITAL_COMMUNITY): Payer: Self-pay

## 2023-11-30 ENCOUNTER — Other Ambulatory Visit (HOSPITAL_COMMUNITY): Payer: Self-pay

## 2024-02-08 ENCOUNTER — Other Ambulatory Visit: Payer: Self-pay
